# Patient Record
Sex: Female | Born: 1965 | Race: White | Hispanic: No | Smoking: Current every day smoker
Health system: Southern US, Community
[De-identification: ages and names within clinical notes are randomized; demographics above are authoritative.]

## PROBLEM LIST (undated history)

## (undated) DIAGNOSIS — Z9889 Other specified postprocedural states: Secondary | ICD-10-CM

## (undated) DIAGNOSIS — F32A Depression, unspecified: Secondary | ICD-10-CM

## (undated) DIAGNOSIS — R112 Nausea with vomiting, unspecified: Secondary | ICD-10-CM

## (undated) DIAGNOSIS — D649 Anemia, unspecified: Secondary | ICD-10-CM

## (undated) DIAGNOSIS — I10 Essential (primary) hypertension: Secondary | ICD-10-CM

## (undated) DIAGNOSIS — F329 Major depressive disorder, single episode, unspecified: Secondary | ICD-10-CM

## (undated) DIAGNOSIS — D332 Benign neoplasm of brain, unspecified: Secondary | ICD-10-CM

## (undated) DIAGNOSIS — Z9221 Personal history of antineoplastic chemotherapy: Secondary | ICD-10-CM

## (undated) DIAGNOSIS — J189 Pneumonia, unspecified organism: Secondary | ICD-10-CM

## (undated) DIAGNOSIS — C50919 Malignant neoplasm of unspecified site of unspecified female breast: Secondary | ICD-10-CM

## (undated) DIAGNOSIS — Z8669 Personal history of other diseases of the nervous system and sense organs: Secondary | ICD-10-CM

## (undated) DIAGNOSIS — J449 Chronic obstructive pulmonary disease, unspecified: Secondary | ICD-10-CM

## (undated) HISTORY — PX: TONSILLECTOMY: SUR1361

## (undated) HISTORY — PX: TUBAL LIGATION: SHX77

## (undated) HISTORY — PX: OTHER SURGICAL HISTORY: SHX169

## (undated) HISTORY — PX: BRAIN SURGERY: SHX531

---

## 1989-09-03 HISTORY — PX: OTHER SURGICAL HISTORY: SHX169

## 1997-09-03 DIAGNOSIS — C50919 Malignant neoplasm of unspecified site of unspecified female breast: Secondary | ICD-10-CM

## 1997-09-03 DIAGNOSIS — Z923 Personal history of irradiation: Secondary | ICD-10-CM

## 1997-09-03 DIAGNOSIS — Z9221 Personal history of antineoplastic chemotherapy: Secondary | ICD-10-CM

## 1997-09-03 HISTORY — DX: Personal history of irradiation: Z92.3

## 1997-09-03 HISTORY — DX: Personal history of antineoplastic chemotherapy: Z92.21

## 1997-09-03 HISTORY — PX: BREAST BIOPSY: SHX20

## 1997-09-03 HISTORY — DX: Malignant neoplasm of unspecified site of unspecified female breast: C50.919

## 2004-06-03 ENCOUNTER — Encounter: Payer: Self-pay | Admitting: Psychiatry

## 2004-06-03 ENCOUNTER — Ambulatory Visit: Payer: Self-pay | Admitting: Unknown Physician Specialty

## 2004-07-04 ENCOUNTER — Ambulatory Visit: Payer: Self-pay | Admitting: Unknown Physician Specialty

## 2004-08-03 ENCOUNTER — Ambulatory Visit: Payer: Self-pay | Admitting: Unknown Physician Specialty

## 2004-08-30 ENCOUNTER — Emergency Department: Payer: Self-pay | Admitting: Emergency Medicine

## 2004-09-03 ENCOUNTER — Ambulatory Visit: Payer: Self-pay | Admitting: Unknown Physician Specialty

## 2007-09-26 ENCOUNTER — Emergency Department: Payer: Self-pay | Admitting: Emergency Medicine

## 2009-05-10 ENCOUNTER — Emergency Department: Payer: Self-pay

## 2010-05-11 ENCOUNTER — Emergency Department: Payer: Self-pay | Admitting: Emergency Medicine

## 2010-08-21 ENCOUNTER — Ambulatory Visit: Payer: Self-pay

## 2011-02-19 ENCOUNTER — Emergency Department: Payer: Self-pay | Admitting: Emergency Medicine

## 2011-02-22 ENCOUNTER — Emergency Department: Payer: Self-pay | Admitting: Emergency Medicine

## 2012-01-04 ENCOUNTER — Emergency Department: Payer: Self-pay | Admitting: Emergency Medicine

## 2012-09-03 ENCOUNTER — Emergency Department: Payer: Self-pay | Admitting: Emergency Medicine

## 2014-05-29 ENCOUNTER — Emergency Department: Payer: Self-pay | Admitting: Emergency Medicine

## 2015-07-04 ENCOUNTER — Encounter: Payer: Self-pay | Admitting: Emergency Medicine

## 2015-07-04 ENCOUNTER — Emergency Department: Payer: Self-pay

## 2015-07-04 ENCOUNTER — Emergency Department
Admission: EM | Admit: 2015-07-04 | Discharge: 2015-07-04 | Disposition: A | Discharge status: Home / Self Care | Payer: Self-pay | Attending: Student | Admitting: Student

## 2015-07-04 DIAGNOSIS — R51 Headache: Secondary | ICD-10-CM | POA: Insufficient documentation

## 2015-07-04 DIAGNOSIS — Z720 Tobacco use: Secondary | ICD-10-CM | POA: Insufficient documentation

## 2015-07-04 DIAGNOSIS — Z880 Allergy status to penicillin: Secondary | ICD-10-CM | POA: Insufficient documentation

## 2015-07-04 DIAGNOSIS — R55 Syncope and collapse: Secondary | ICD-10-CM | POA: Insufficient documentation

## 2015-07-04 DIAGNOSIS — Z79899 Other long term (current) drug therapy: Secondary | ICD-10-CM | POA: Insufficient documentation

## 2015-07-04 DIAGNOSIS — I10 Essential (primary) hypertension: Secondary | ICD-10-CM | POA: Insufficient documentation

## 2015-07-04 HISTORY — DX: Essential (primary) hypertension: I10

## 2015-07-04 LAB — URINALYSIS COMPLETE WITH MICROSCOPIC (ARMC ONLY)
Bilirubin Urine: NEGATIVE
Glucose, UA: NEGATIVE mg/dL
Hgb urine dipstick: NEGATIVE
Ketones, ur: NEGATIVE mg/dL
Leukocytes, UA: NEGATIVE
Nitrite: NEGATIVE
Protein, ur: NEGATIVE mg/dL
Specific Gravity, Urine: 1.005 (ref 1.005–1.030)
pH: 6 (ref 5.0–8.0)

## 2015-07-04 LAB — CBC
HCT: 46.3 % (ref 35.0–47.0)
Hemoglobin: 16 g/dL (ref 12.0–16.0)
MCH: 31.8 pg (ref 26.0–34.0)
MCHC: 34.5 g/dL (ref 32.0–36.0)
MCV: 92.4 fL (ref 80.0–100.0)
Platelets: 245 10*3/uL (ref 150–440)
RBC: 5.01 MIL/uL (ref 3.80–5.20)
RDW: 14.2 % (ref 11.5–14.5)
WBC: 11.8 10*3/uL — ABNORMAL HIGH (ref 3.6–11.0)

## 2015-07-04 LAB — BASIC METABOLIC PANEL
Anion gap: 12 (ref 5–15)
BUN: 9 mg/dL (ref 6–20)
CO2: 19 mmol/L — ABNORMAL LOW (ref 22–32)
Calcium: 10 mg/dL (ref 8.9–10.3)
Chloride: 105 mmol/L (ref 101–111)
Creatinine, Ser: 0.95 mg/dL (ref 0.44–1.00)
GFR calc Af Amer: >60 mL/min (ref >60)
GFR calc non Af Amer: >60 mL/min (ref >60)
Glucose, Bld: 81 mg/dL (ref 65–99)
Potassium: 3.7 mmol/L (ref 3.5–5.1)
Sodium: 136 mmol/L (ref 135–145)

## 2015-07-04 LAB — TROPONIN I: Troponin I: <0.03 ng/mL (ref <0.031)

## 2015-07-04 LAB — GLUCOSE, CAPILLARY: Glucose-Capillary: 87 mg/dL (ref 65–99)

## 2015-07-04 MED ORDER — ACETAMINOPHEN 500 MG PO TABS
1000.0000 mg | ORAL_TABLET | Freq: Once | ORAL | Status: AC
  Administered 2015-07-04: 1000 mg via ORAL
  Filled 2015-07-04: qty 2

## 2015-07-04 NOTE — ED Notes (Signed)
Pt to ed with c/o weakness and syncopal episode at work today.  Pt denies chest pain, but reports dizziness and weakness.

## 2015-07-04 NOTE — ED Provider Notes (Signed)
[facility] Emergency Department Provider Note  ____________________________________________  Time seen: Approximately 3:05 PM  I have reviewed the triage vital signs and the nursing notes.   HISTORY  Chief Complaint Weakness    HPI Kelsey Russo is a 49 y.o. female with history of hypertension who presents for evaluation of syncopal episode which occurred suddenly earlier today while she was at work, now resolved. The patient reports that she was ill last week with a diarrheal illness and without of work for the past several days. 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 She believes she crumpled to the ground but did not hit her head. She is not complaining of any pain complaints other than mild headache at this time. She denies any chest pain or difficulty breathing. She reports that her diarrhea has resolved. She has a history of fainting in the past when she was pregnant and she reports that that was due to "dehydration". She is currently postmenopausal and denies any chance of pregnancy. Currently her headache is mild. She reports that that may be because she did not eat today. She is hungry but she has no other complaints. No modifying factors.   Past Medical History  Diagnosis Date  . Hypertension     There are no active problems to display for this patient.   History reviewed. No pertinent past surgical history.  Current Outpatient Rx  Name  Route  Sig  Dispense  Refill  . amLODipine (NORVASC) 5 MG tablet   Oral   Take 5 mg by mouth daily.         Marland Kitchen ibuprofen (ADVIL,MOTRIN) 600 MG tablet   Oral   Take 600 mg by mouth every 6 (six) hours as needed.         . Melatonin 5 MG TABS   Oral   Take 1 tablet by mouth at bedtime and may repeat dose one time if needed.           Allergies Penicillins  History reviewed. No pertinent family history.  Social  History Social History  Substance Use Topics  . Smoking status: Current Every Day Smoker  . Smokeless tobacco: None  . Alcohol Use: No    Review of Systems Constitutional: No fever/chills Eyes: No visual changes. ENT: No sore throat. Cardiovascular: Denies chest pain. Respiratory: Denies shortness of breath. Gastrointestinal: No abdominal pain.  No nausea, no vomiting.  No diarrhea.  No constipation. Genitourinary: Negative for dysuria. Musculoskeletal: Negative for back pain. Skin: Negative for rash. Neurological: Positive for headache, no focal weakness or numbness.  10-point ROS otherwise negative.  ____________________________________________   PHYSICAL EXAM:  VITAL SIGNS: ED Triage Vitals  Enc Vitals Group     BP 07/04/15 1400 156/101 mmHg     Pulse Rate 07/04/15 1158 103     Resp 07/04/15 1400 21     Temp 07/04/15 1158 97.9 F (36.6 C)     Temp Source 07/04/15 1158 Oral     SpO2 07/04/15 1158 98 %     Weight 07/04/15 1158 150 lb (68.04 kg)     Height 07/04/15 1158 5\' 7"  (1.702 m)     Head Cir --      Peak Flow --      Pain Score 07/04/15 1150 5     Pain Loc --      Pain Edu? --      Excl. in  GC? --     Constitutional: Alert and oriented. Well appearing and in no acute distress. Eyes: Conjunctivae are normal. PERRL. EOMI. Head: Atraumatic. Nose: No congestion/rhinnorhea. Mouth/Throat: Mucous membranes are moist.  Oropharynx non-erythematous. Neck: No stridor.  No cervical spine tenderness to palpation. Cardiovascular: Normal rate, regular rhythm. Grossly normal heart sounds.  Good peripheral circulation. Respiratory: Normal respiratory effort.  No retractions. Lungs CTAB. Gastrointestinal: Soft and nontender. No distention. No CVA tenderness. Genitourinary: deferred Musculoskeletal: No lower extremity tenderness nor edema.  No joint effusions. Neurologic:  Normal speech and language. No gross focal neurologic deficits are appreciated. 5 out of 5 strength  in bilateral upper and lower extremities, sensation intact to light touch throughout. Cranial nerves II through XII intact. Skin:  Skin is warm, dry and intact. No rash noted. Psychiatric: Mood and affect are normal. Speech and behavior are normal.  ____________________________________________   LABS (all labs ordered are listed, but only abnormal results are displayed)  Labs Reviewed  BASIC METABOLIC PANEL - Abnormal; Notable for the following:    CO2 19 (*)    All other components within normal limits  CBC - Abnormal; Notable for the following:    WBC 11.8 (*)    All other components within normal limits  URINALYSIS COMPLETEWITH MICROSCOPIC ([facility] ONLY) - Abnormal; Notable for the following:    Color, Urine STRAW (*)    APPearance CLEAR (*)    Bacteria, UA RARE (*)    Squamous Epithelial / LPF 0-5 (*)    All other components within normal limits  GLUCOSE, CAPILLARY  TROPONIN I  CBG MONITORING, ED   ____________________________________________  EKG  ED ECG REPORT I, Joanne Gavel, the attending physician, personally viewed and interpreted this ECG.   Date: 07/04/2015  EKG Time: 11:56  Rate: 100  Rhythm: normal sinus rhythm  Axis: normal  Intervals:none  ST&T Change: No acute ST elevation. Q-wave in V2. EKG is unchanged from 05/11/2010  ____________________________________________  RADIOLOGY  CXR IMPRESSION: No acute abnormality. Aortic atherosclerosis. ____________________________________________   PROCEDURES  Procedure(s) performed: None  Critical Care performed: No  ____________________________________________   INITIAL IMPRESSION / ASSESSMENT AND PLAN / ED COURSE  Pertinent labs & imaging results that were available during my care of the patient were reviewed by me and considered in my medical decision making (see chart for details).  Kelsey Russo is a 49 y.o. female with history of hypertension who presents for evaluation of syncopal episode  which occurred suddenly earlier today while she was at work. On exam, she is very well-appearing and in no acute distress. She was tachycardic on arrival however at the time of my assessment, tachycardia has resolved without any intervention. Vital signs otherwise stable, she is afebrile. She has a benign and atraumatic examination including an intact neurological exam. Suspect likely vasovagal syncopal episode in the setting of dehydration given recent diarrheal illness as well as significant prodrome today. No chest pain, no difficulty breathing, troponin negative, EKG unchanged from prior, not consistent with cardiogenic syncope. Intact neurological exam, not consistent with neurogenic syncope. Labs reviewed. CBC notable for very mild leukocytosis which may be catecholamine induced. BMP unremarkable. Urinalysis not consistent with infection. Chest x-ray shows no acute cardiopulmonary abnormality. She has declined any IV fluids stating she would just like to go home instead. We discussed oral hydration, return precautions and need for close PCP follow-up and she is comfortable with the discharge plan. ____________________________________________   FINAL CLINICAL IMPRESSION(S) / ED DIAGNOSES  Final diagnoses:  Vasovagal syncope      Joanne Gavel, MD 07/04/15 1558

## 2015-07-04 NOTE — ED Notes (Signed)
PT CBG 87 

## 2015-07-13 ENCOUNTER — Observation Stay: Payer: Self-pay

## 2015-07-13 ENCOUNTER — Observation Stay
Admission: EM | Admit: 2015-07-13 | Discharge: 2015-07-13 | Discharge status: Against Medical Advice | Payer: Self-pay | Attending: Internal Medicine | Admitting: Internal Medicine

## 2015-07-13 ENCOUNTER — Encounter: Payer: Self-pay | Admitting: Emergency Medicine

## 2015-07-13 ENCOUNTER — Emergency Department: Payer: Self-pay

## 2015-07-13 ENCOUNTER — Other Ambulatory Visit: Payer: Self-pay

## 2015-07-13 DIAGNOSIS — R4182 Altered mental status, unspecified: Principal | ICD-10-CM | POA: Diagnosis present

## 2015-07-13 DIAGNOSIS — R410 Disorientation, unspecified: Secondary | ICD-10-CM

## 2015-07-13 DIAGNOSIS — F329 Major depressive disorder, single episode, unspecified: Secondary | ICD-10-CM | POA: Insufficient documentation

## 2015-07-13 DIAGNOSIS — Z79899 Other long term (current) drug therapy: Secondary | ICD-10-CM | POA: Insufficient documentation

## 2015-07-13 DIAGNOSIS — Z853 Personal history of malignant neoplasm of breast: Secondary | ICD-10-CM | POA: Insufficient documentation

## 2015-07-13 DIAGNOSIS — G40909 Epilepsy, unspecified, not intractable, without status epilepticus: Secondary | ICD-10-CM | POA: Insufficient documentation

## 2015-07-13 DIAGNOSIS — R918 Other nonspecific abnormal finding of lung field: Secondary | ICD-10-CM | POA: Insufficient documentation

## 2015-07-13 DIAGNOSIS — R200 Anesthesia of skin: Secondary | ICD-10-CM | POA: Insufficient documentation

## 2015-07-13 DIAGNOSIS — Z9889 Other specified postprocedural states: Secondary | ICD-10-CM | POA: Insufficient documentation

## 2015-07-13 DIAGNOSIS — Z86011 Personal history of benign neoplasm of the brain: Secondary | ICD-10-CM | POA: Insufficient documentation

## 2015-07-13 DIAGNOSIS — Z880 Allergy status to penicillin: Secondary | ICD-10-CM | POA: Insufficient documentation

## 2015-07-13 DIAGNOSIS — F17200 Nicotine dependence, unspecified, uncomplicated: Secondary | ICD-10-CM | POA: Insufficient documentation

## 2015-07-13 DIAGNOSIS — I10 Essential (primary) hypertension: Secondary | ICD-10-CM | POA: Insufficient documentation

## 2015-07-13 DIAGNOSIS — R079 Chest pain, unspecified: Secondary | ICD-10-CM | POA: Insufficient documentation

## 2015-07-13 DIAGNOSIS — Z7901 Long term (current) use of anticoagulants: Secondary | ICD-10-CM | POA: Insufficient documentation

## 2015-07-13 HISTORY — DX: Malignant neoplasm of unspecified site of unspecified female breast: C50.919

## 2015-07-13 HISTORY — DX: Depression, unspecified: F32.A

## 2015-07-13 HISTORY — DX: Major depressive disorder, single episode, unspecified: F32.9

## 2015-07-13 HISTORY — DX: Personal history of other diseases of the nervous system and sense organs: Z86.69

## 2015-07-13 LAB — URINE DRUG SCREEN, QUALITATIVE (ARMC ONLY)
Amphetamines, Ur Screen: NOT DETECTED
Barbiturates, Ur Screen: NOT DETECTED
Benzodiazepine, Ur Scrn: NOT DETECTED
Cannabinoid 50 Ng, Ur ~~LOC~~: POSITIVE — AB
Cocaine Metabolite,Ur ~~LOC~~: NOT DETECTED
MDMA (Ecstasy)Ur Screen: NOT DETECTED
Methadone Scn, Ur: NOT DETECTED
Opiate, Ur Screen: NOT DETECTED
Phencyclidine (PCP) Ur S: NOT DETECTED
Tricyclic, Ur Screen: NOT DETECTED

## 2015-07-13 LAB — BASIC METABOLIC PANEL
Anion gap: 9 (ref 5–15)
BUN: 23 mg/dL — ABNORMAL HIGH (ref 6–20)
CO2: 27 mmol/L (ref 22–32)
Calcium: 9.4 mg/dL (ref 8.9–10.3)
Chloride: 102 mmol/L (ref 101–111)
Creatinine, Ser: 0.94 mg/dL (ref 0.44–1.00)
GFR calc Af Amer: >60 mL/min (ref >60)
GFR calc non Af Amer: >60 mL/min (ref >60)
Glucose, Bld: 113 mg/dL — ABNORMAL HIGH (ref 65–99)
Potassium: 4.6 mmol/L (ref 3.5–5.1)
Sodium: 138 mmol/L (ref 135–145)

## 2015-07-13 LAB — URINALYSIS COMPLETE WITH MICROSCOPIC (ARMC ONLY)
Bacteria, UA: NONE SEEN
Bilirubin Urine: NEGATIVE
Glucose, UA: NEGATIVE mg/dL
Hgb urine dipstick: NEGATIVE
Ketones, ur: NEGATIVE mg/dL
Leukocytes, UA: NEGATIVE
Nitrite: NEGATIVE
Protein, ur: NEGATIVE mg/dL
Specific Gravity, Urine: 1.008 (ref 1.005–1.030)
Squamous Epithelial / LPF: NONE SEEN
pH: 6 (ref 5.0–8.0)

## 2015-07-13 LAB — BLOOD GAS, ARTERIAL
Acid-Base Excess: 0.3 mmol/L (ref 0.0–3.0)
Bicarbonate: 24.6 mEq/L (ref 21.0–28.0)
FIO2: 0.21
O2 Saturation: 96.8 %
Patient temperature: 37
pCO2 arterial: 38 mmHg (ref 32.0–48.0)
pH, Arterial: 7.42 (ref 7.350–7.450)
pO2, Arterial: 87 mmHg (ref 83.0–108.0)

## 2015-07-13 LAB — HEPATIC FUNCTION PANEL
ALT: 17 U/L (ref 14–54)
AST: 19 U/L (ref 15–41)
Albumin: 4.2 g/dL (ref 3.5–5.0)
Alkaline Phosphatase: 101 U/L (ref 38–126)
Bilirubin, Direct: <0.1 mg/dL — ABNORMAL LOW (ref 0.1–0.5)
Total Bilirubin: 0.2 mg/dL — ABNORMAL LOW (ref 0.3–1.2)
Total Protein: 7.4 g/dL (ref 6.5–8.1)

## 2015-07-13 LAB — TSH: TSH: 1.706 u[IU]/mL (ref 0.350–4.500)

## 2015-07-13 LAB — SALICYLATE LEVEL: Salicylate Lvl: <4 mg/dL (ref 2.8–30.0)

## 2015-07-13 LAB — CBC
HCT: 40.7 % (ref 35.0–47.0)
Hemoglobin: 13.8 g/dL (ref 12.0–16.0)
MCH: 31.1 pg (ref 26.0–34.0)
MCHC: 33.8 g/dL (ref 32.0–36.0)
MCV: 92.1 fL (ref 80.0–100.0)
Platelets: 187 10*3/uL (ref 150–440)
RBC: 4.42 MIL/uL (ref 3.80–5.20)
RDW: 13.8 % (ref 11.5–14.5)
WBC: 11.4 10*3/uL — ABNORMAL HIGH (ref 3.6–11.0)

## 2015-07-13 LAB — AMMONIA: Ammonia: 24 umol/L (ref 9–35)

## 2015-07-13 LAB — ACETAMINOPHEN LEVEL: Acetaminophen (Tylenol), Serum: <10 ug/mL — ABNORMAL LOW (ref 10–30)

## 2015-07-13 LAB — ETHANOL: Alcohol, Ethyl (B): <5 mg/dL (ref <5)

## 2015-07-13 LAB — FIBRIN DERIVATIVES D-DIMER (ARMC ONLY): Fibrin derivatives D-dimer (ARMC): 416 (ref 0–499)

## 2015-07-13 LAB — TROPONIN I: Troponin I: <0.03 ng/mL (ref <0.031)

## 2015-07-13 MED ORDER — ONDANSETRON HCL 4 MG/2ML IJ SOLN
4.0000 mg | Freq: Once | INTRAMUSCULAR | Status: AC
  Administered 2015-07-13: 4 mg via INTRAVENOUS
  Filled 2015-07-13: qty 2

## 2015-07-13 MED ORDER — SODIUM CHLORIDE 0.9 % IV BOLUS (SEPSIS)
1000.0000 mL | Freq: Once | INTRAVENOUS | Status: AC
  Administered 2015-07-13: 1000 mL via INTRAVENOUS

## 2015-07-13 MED ORDER — AMLODIPINE BESYLATE 5 MG PO TABS: 5.0000 mg | ORAL_TABLET | Freq: Every day | ORAL | Status: DC

## 2015-07-13 MED ORDER — HYDRALAZINE HCL 20 MG/ML IJ SOLN: 10.0000 mg | Freq: Four times a day (QID) | INTRAMUSCULAR | Status: DC | PRN

## 2015-07-13 MED ORDER — ACETAMINOPHEN 650 MG RE SUPP
650.0000 mg | Freq: Four times a day (QID) | RECTAL | Status: DC | PRN
Start: 2015-07-13 — End: 2015-07-13

## 2015-07-13 MED ORDER — NITROGLYCERIN 0.4 MG SL SUBL: 0.4000 mg | SUBLINGUAL_TABLET | SUBLINGUAL | Status: DC | PRN

## 2015-07-13 MED ORDER — ACETAMINOPHEN 325 MG PO TABS: 650.0000 mg | ORAL_TABLET | Freq: Four times a day (QID) | ORAL | Status: DC | PRN

## 2015-07-13 MED ORDER — SODIUM CHLORIDE 0.9 % IV SOLN
INTRAVENOUS | Status: DC
  Administered 2015-07-13: 16:00:00 via INTRAVENOUS

## 2015-07-13 MED ORDER — ONDANSETRON HCL 4 MG/2ML IJ SOLN: 4.0000 mg | Freq: Four times a day (QID) | INTRAMUSCULAR | Status: DC | PRN

## 2015-07-13 MED ORDER — LORAZEPAM 2 MG/ML IJ SOLN
1.0000 mg | Freq: Once | INTRAMUSCULAR | Status: AC
  Administered 2015-07-13: 1 mg via INTRAVENOUS
  Filled 2015-07-13: qty 1

## 2015-07-13 MED ORDER — ENOXAPARIN SODIUM 40 MG/0.4ML ~~LOC~~ SOLN: 40.0000 mg | SUBCUTANEOUS | Status: DC

## 2015-07-13 MED ORDER — ONDANSETRON HCL 4 MG PO TABS: 4.0000 mg | ORAL_TABLET | Freq: Four times a day (QID) | ORAL | Status: DC | PRN

## 2015-07-13 MED ORDER — MECLIZINE HCL 12.5 MG PO TABS: 25.0000 mg | ORAL_TABLET | Freq: Once | ORAL | Status: DC

## 2015-07-13 MED ORDER — SODIUM CHLORIDE 0.9 % IJ SOLN: 3.0000 mL | Freq: Two times a day (BID) | INTRAMUSCULAR | Status: DC

## 2015-07-13 MED ORDER — ASPIRIN 81 MG PO CHEW
324.0000 mg | CHEWABLE_TABLET | Freq: Once | ORAL | Status: DC
  Filled 2015-07-13: qty 4

## 2015-07-13 MED ORDER — GADOBENATE DIMEGLUMINE 529 MG/ML IV SOLN
15.0000 mL | Freq: Once | INTRAVENOUS | Status: AC | PRN
  Administered 2015-07-13: 14 mL via INTRAVENOUS

## 2015-07-13 NOTE — Progress Notes (Signed)
Pt stating she is going home since we are not giving her a sleeping pill and letting her leave the floor to smoke.  I tried to convince the patient and educate her and her family on why she should stay.  Pt insisted on leaving even after offering to page doctor for a smoking alternative.

## 2015-07-13 NOTE — ED Notes (Signed)
Patient transported to X-ray 

## 2015-07-13 NOTE — ED Notes (Signed)
Patient unresponsive to this RN. Has gag reflex when suctioned. Does not open eyes or respond to voice commands. Daughter states patient is hard of hearing. Family denies any drug use.

## 2015-07-13 NOTE — ED Notes (Signed)
Pt presents to ED with c/o left sided chest pain that radiates to right shoulder, onset yesterday. Pt also reports SOB, nausea and vomiting. Pt reports hypertension 201/168 prior at arrival.

## 2015-07-13 NOTE — ED Notes (Signed)
Pt very restless, states she has been confused, has left lower chest pain,,

## 2015-07-13 NOTE — H&P (Addendum)
[facility] [place] NAME: Kelsey Russo    MR#:  [id number]  DATE OF BIRTH:  Aug 28, 1966  DATE OF ADMISSION:  07/13/2015  PRIMARY CARE PHYSICIAN: Lorelee Market, MD   REQUESTING/REFERRING PHYSICIAN: Dr. Shirlyn Goltz  CHIEF COMPLAINT:   Chief Complaint  Patient presents with  . Chest Pain    HISTORY OF PRESENT ILLNESS:  Kelsey Russo  is a 49 y.o. female with a known history of hypertension, history of breast cancer in remission, remote history of seizures, depression not in any medications presents to the hospital secondary to altered mental status. Patient is very lethargic and most of the history is obtained from her daughter at bedside. Daughter states the patient at baseline is alert and oriented. She felt that friend and went to [facility] to check her blood pressure yesterday and her systolic pressure was greater than 200. Her mother came home did not take any medication and complained of some numbness in her arm and leg and went to bed. Patient's husband described that the patient got more delirious last night, very disoriented was stumbling having jerky moments complaining of chest pain and neck pain and very vague complaints. Patient did not take any medications, did not overdose according to the family. No alcohol abuse. Her urine tox screen is negative here. But patient is very lethargic and just arousable at this time. His more restless and had greater than thousand mL of urine retained, and still Foley catheter was placed, patient became more calm and resting.  PAST MEDICAL HISTORY:   Past Medical History  Diagnosis Date  . Hypertension   . Depression   . Breast cancer [facility])     Status post right breast lumpectomy and radiation  . History of seizure disorder     PAST SURGICAL HISTORY:   Past Surgical History  Procedure Laterality Date  . Brain tumor resection    . Right breast lumpectomy      SOCIAL  HISTORY:   Social History  Substance Use Topics  . Smoking status: Current Every Day Smoker  . Smokeless tobacco: Not on file  . Alcohol Use: No    FAMILY HISTORY:   Family History  Problem Relation Age of Onset  . Depression Mother   . CAD Father   . Hypertension Father   . Lung cancer Father   . Breast cancer Maternal Grandmother     DRUG ALLERGIES:   Allergies  Allergen Reactions  . Penicillins Rash    REVIEW OF SYSTEMS:   Review of Systems  Unable to perform ROS: mental acuity    MEDICATIONS AT HOME:   Prior to Admission medications   Medication Sig Start Date End Date Taking? Authorizing Provider  amLODipine (NORVASC) 5 MG tablet Take 5 mg by mouth daily.   Yes Historical Provider, MD  ibuprofen (ADVIL,MOTRIN) 600 MG tablet Take 600 mg by mouth every 6 (six) hours as needed.   Yes Historical Provider, MD  Melatonin 5 MG TABS Take 1 tablet by mouth at bedtime and may repeat dose one time if needed.   Yes Historical Provider, MD      VITAL SIGNS:  Blood pressure 103/74, pulse 80, temperature 98.1 F (36.7 C), temperature source Oral, resp. rate 17, SpO2 100 %.  PHYSICAL EXAMINATION:   Physical Exam  GENERAL:  49 y.o.-year-old patient lying in the bed, very lethargic. Agitated and moving restless in bed EYES: Pupils equal, round, reactive to light and accommodation.  No scleral icterus. Extraocular muscles intact.  HEENT: Head atraumatic, normocephalic. Oropharynx and nasopharynx clear.  NECK:  Supple, no jugular venous distention. No thyroid enlargement, no tenderness.  LUNGS: Snoring at times when sleeping deeply, Normal breath sounds bilaterally, no wheezing, rales,rhonchi or crepitation. No use of accessory muscles of respiration. Decreased bibasilar breath sounds CARDIOVASCULAR: S1, S2 normal. No murmurs, rubs, or gallops.  ABDOMEN: Soft, nontender, nondistended. Bowel sounds present. No organomegaly or mass.  EXTREMITIES: No pedal edema, cyanosis, or  clubbing.  NEUROLOGIC: Patient is very lethargic, unable to follow any commands. Arouses to deep sternal rub and has some myoclonic jerks. Withdrawing all extremities to pain  PSYCHIATRIC: Patient is lethargic and not following commands  SKIN: No obvious rash, lesion, or ulcer.   LABORATORY PANEL:   CBC  Recent Labs Lab 07/13/15 0648  WBC 11.4*  HGB 13.8  HCT 40.7  PLT 187   ------------------------------------------------------------------------------------------------------------------  Chemistries   Recent Labs Lab 07/13/15 0648  NA 138  K 4.6  CL 102  CO2 27  GLUCOSE 113*  BUN 23*  CREATININE 0.94  CALCIUM 9.4  AST 19  ALT 17  ALKPHOS 101  BILITOT 0.2*   ------------------------------------------------------------------------------------------------------------------  Cardiac Enzymes  Recent Labs Lab 07/13/15 0648  TROPONINI <0.03   ------------------------------------------------------------------------------------------------------------------  RADIOLOGY:  Dg Chest 2 View  07/13/2015  CLINICAL DATA:  49 year old female with left-sided chest pain radiating into the right shoulder EXAM: CHEST  2 VIEW COMPARISON:  Prior chest x-ray 07/04/2015 FINDINGS: Cardiac and mediastinal contours are within normal limits. Atherosclerotic calcification present in the transverse aorta. Diffuse mild interstitial prominence not significantly changed compared to prior. No pneumothorax, pleural effusion or focal airspace consolidation. The lungs remain hyperinflated. No acute osseous abnormality. IMPRESSION: 1. No acute cardiopulmonary process. 2. Pulmonary hyperinflation with diffuse mild interstitial prominence appear similar and suggests underlying COPD. Electronically Signed   By: Jacqulynn Cadet M.D.   On: 07/13/2015 07:46   Ct Head Wo Contrast  07/13/2015  CLINICAL DATA:  Left-sided chest pain radiating to the right shoulder. EXAM: CT HEAD WITHOUT CONTRAST TECHNIQUE:  Contiguous axial images were obtained from the base of the skull through the vertex without intravenous contrast. COMPARISON:  None. FINDINGS: There is no evidence of mass effect, midline shift or extra-axial fluid collections. There is no evidence of a space-occupying lesion or intracranial hemorrhage. There is no evidence of a cortical-based area of acute infarction. There is a small calcification in the inferior left cerebellum adjacent to the fourth ventricle which is likely dystrophic. The ventricles and sulci are appropriate for the patient's age. The basal cisterns are patent. Visualized portions of the orbits are unremarkable. The visualized portions of the paranasal sinuses and mastoid air cells are unremarkable. There is evidence of prior suboccipital craniotomy. No other focal calvarial abnormality. IMPRESSION: No acute intracranial pathology. Electronically Signed   By: Kathreen Devoid   On: 07/13/2015 08:52    EKG:   Orders placed or performed during the hospital encounter of 07/13/15  . EKG 12-Lead  . EKG 12-Lead  . EKG 12-Lead  . EKG 12-Lead  . EKG 12-Lead  . EKG 12-Lead    IMPRESSION AND PLAN:   Kelsey Russo  is a 49 y.o. female with a known history of hypertension, history of breast cancer in remission, remote history of seizures, depression not in any medications presents to the hospital secondary to altered mental status.  #1 altered mental status-unknown cause. Could be toxic/embolic encephalopathy. -Not sure if patient took any  medication-overdosed -No prescription medication seen other than Norvasc and melatonin. -Urine tox screen is completely negative other than for marijuana -CT of the head is negative for any acute findings. Urine analysis chest x-ray did not show any infection. -Remote history of seizures and also surgery done for brain tumor several several years ago. EEG ordered. -Ammonia level ordered. After neurology consulted. -Neuro checks frequently. For now  patient is protecting her airway and she has a cough and a gag reflex  #2 hypertension-hold her low-dose Norvasc at this time  #3 history of breast cancer-that is post right breast lumpectomy and radiation, currently in remission.  #4 DVT prophylaxis-on Lovenox    All the records are reviewed and case discussed with ED provider. Management plans discussed with the patient, family and they are in agreement.  CODE STATUS: Full Code  TOTAL TIME TAKING CARE OF THIS PATIENT: 50 minutes.    Gladstone Lighter M.D on 07/13/2015 at 12:58 PM  Between 7am to 6pm - Pager - [phone]  After 6pm go to [email or web address] - password [email or web address]  Office  [phone]  CC: Primary care physician; Lorelee Market, MD

## 2015-07-13 NOTE — ED Notes (Signed)
Patient transported to CT 

## 2015-07-13 NOTE — ED Provider Notes (Signed)
CSN: [id number]     Arrival date & time 07/13/15  0737 History   First MD Initiated Contact with Patient 07/13/15 [phone]     Chief Complaint  Patient presents with  . Chest Pain     (Consider location/radiation/quality/duration/timing/severity/associated sxs/prior Treatment) The history is provided by the patient.  Kelsey Russo is a 49 y.o. female hx of HTN, here presenting with chest pain, dizziness, shortness of breath. Patient states that she started having left-sided chest pain that radiated to the shoulder. Started this morning associated with some nausea as well as 3 episodes of vomiting. She did check her blood pressure and was elevated 201/168. Patient states that she has some headaches as well has been going on first several days as well as some neck pain. Patient is very good historian and appears intoxicated but denies any alcohol yesterday. States that she feels confused but couldn't give me much details. Seen in the ER about a week ago for syncope at work and had normal labs as well as UA and chest x-ray.   Level V caveat- AMS     Past Medical History  Diagnosis Date  . Hypertension    History reviewed. No pertinent past surgical history. History reviewed. No pertinent family history. Social History  Substance Use Topics  . Smoking status: Current Every Day Smoker  . Smokeless tobacco: None  . Alcohol Use: No   OB History    No data available     Review of Systems  Unable to perform ROS: Mental status change  Cardiovascular: Positive for chest pain.  All other systems reviewed and are negative.     Allergies  Penicillins  Home Medications   Prior to Admission medications   Medication Sig Start Date End Date Taking? Authorizing Provider  amLODipine (NORVASC) 5 MG tablet Take 5 mg by mouth daily.   Yes Historical Provider, MD  ibuprofen (ADVIL,MOTRIN) 600 MG tablet Take 600 mg by mouth every 6 (six) hours as needed.   Yes Historical Provider, MD   Melatonin 5 MG TABS Take 1 tablet by mouth at bedtime and may repeat dose one time if needed.   Yes Historical Provider, MD   BP 102/67 mmHg  Pulse 85  Temp(Src) 98.1 F (36.7 C) (Oral)  Resp 19  SpO2 100% Physical Exam  Constitutional:  Confused   HENT:  Head: Normocephalic.  Mouth/Throat: Oropharynx is clear and moist.  Eyes: Conjunctivae are normal. Pupils are equal, round, and reactive to light.  No obvious nystagmus   Neck: Normal range of motion. Neck supple.  Cardiovascular: Normal rate, regular rhythm and normal heart sounds.   Pulmonary/Chest: Breath sounds normal. No respiratory distress. She has no wheezes. She has no rales.  Abdominal: Soft. Bowel sounds are normal. She exhibits no distension. There is no tenderness. There is no rebound.  Musculoskeletal: Normal range of motion.  Neurological:  Alert, moving all extremities. Doesn't follow commands well. Appears intoxicated   Skin: Skin is warm.  Psychiatric:  Unable   Nursing note and vitals reviewed.   ED Course  Procedures (including critical care time)  CRITICAL CARE Performed by: Darl Householder, Marciel Offenberger   Total critical care time: 30 minutes  Critical care time was exclusive of separately billable procedures and treating other patients.  Critical care was necessary to treat or prevent imminent or life-threatening deterioration.  Critical care was time spent personally by me on the following activities: development of treatment plan with patient and/or surrogate as well as nursing, discussions  with consultants, evaluation of patient's response to treatment, examination of patient, obtaining history from patient or surrogate, ordering and performing treatments and interventions, ordering and review of laboratory studies, ordering and review of radiographic studies, pulse oximetry and re-evaluation of patient's condition.   Labs Review Labs Reviewed  CBC - Abnormal; Notable for the following:    WBC 11.4 (*)    All  other components within normal limits  BASIC METABOLIC PANEL - Abnormal; Notable for the following:    Glucose, Bld 113 (*)    BUN 23 (*)    All other components within normal limits  URINALYSIS COMPLETEWITH MICROSCOPIC ([facility] ONLY) - Abnormal; Notable for the following:    Color, Urine STRAW (*)    APPearance CLEAR (*)    All other components within normal limits  URINE DRUG SCREEN, QUALITATIVE ([facility] ONLY) - Abnormal; Notable for the following:    Cannabinoid 50 Ng, Ur [place] POSITIVE (*)    All other components within normal limits  HEPATIC FUNCTION PANEL - Abnormal; Notable for the following:    Total Bilirubin 0.2 (*)    Bilirubin, Direct <0.1 (*)    All other components within normal limits  ACETAMINOPHEN LEVEL - Abnormal; Notable for the following:    Acetaminophen (Tylenol), Serum <10 (*)    All other components within normal limits  TROPONIN I  ETHANOL  FIBRIN DERIVATIVES D-DIMER ([facility] ONLY)  SALICYLATE LEVEL  BLOOD GAS, ARTERIAL    Imaging Review Dg Chest 2 View  07/13/2015  CLINICAL DATA:  49 year old female with left-sided chest pain radiating into the right shoulder EXAM: CHEST  2 VIEW COMPARISON:  Prior chest x-ray 07/04/2015 FINDINGS: Cardiac and mediastinal contours are within normal limits. Atherosclerotic calcification present in the transverse aorta. Diffuse mild interstitial prominence not significantly changed compared to prior. No pneumothorax, pleural effusion or focal airspace consolidation. The lungs remain hyperinflated. No acute osseous abnormality. IMPRESSION: 1. No acute cardiopulmonary process. 2. Pulmonary hyperinflation with diffuse mild interstitial prominence appear similar and suggests underlying COPD. Electronically Signed   By: Jacqulynn Cadet M.D.   On: 07/13/2015 07:46   Ct Head Wo Contrast  07/13/2015  CLINICAL DATA:  Left-sided chest pain radiating to the right shoulder. EXAM: CT HEAD WITHOUT CONTRAST TECHNIQUE: Contiguous axial images were  obtained from the base of the skull through the vertex without intravenous contrast. COMPARISON:  None. FINDINGS: There is no evidence of mass effect, midline shift or extra-axial fluid collections. There is no evidence of a space-occupying lesion or intracranial hemorrhage. There is no evidence of a cortical-based area of acute infarction. There is a small calcification in the inferior left cerebellum adjacent to the fourth ventricle which is likely dystrophic. The ventricles and sulci are appropriate for the patient's age. The basal cisterns are patent. Visualized portions of the orbits are unremarkable. The visualized portions of the paranasal sinuses and mastoid air cells are unremarkable. There is evidence of prior suboccipital craniotomy. No other focal calvarial abnormality. IMPRESSION: No acute intracranial pathology. Electronically Signed   By: Kathreen Devoid   On: 07/13/2015 08:52   I have personally reviewed and evaluated these images and lab results as part of my medical decision-making.   EKG Interpretation None       ED ECG REPORT I, Amarionna Arca, the attending physician, personally viewed and interpreted this ECG.   Date: 07/13/2015  EKG Time: 6:29 am  Rate: 99  Rhythm: normal EKG, normal sinus rhythm, unchanged from previous tracings, prolonged QT interval  Axis:  normal  Intervals:none  ST&T Change: none   MDM   Final diagnoses:  None    Kelsey Russo is a 49 y.o. female here with headaches, chest pain, shortness of breath, possible alcohol intoxication. Difficult to exam due to confusion. Differential is broad. Will get CT head to r/o bleed, labs, trop, CXR, tox, d-dimer (to r/o dissection and or PE). Will reassess.   9:30 AM D-dimer neg. Tox showed marijuana. CT head nl. Still altered. Talked to daughter. She has been confused for several days. Has hx of depression not on meds. Had tried committing suicide with ingestion previously. Denies any drug use but patient very  altered. Urinated on herself in the room.   11:29 AM Still altered. UDS neg. Tox neg. Still has prolonged QTc 480s. Likely taken some drugs. Will admit.     Wandra Arthurs, MD 07/13/15 [id number]

## 2015-07-13 NOTE — ED Notes (Signed)
1200 cc urine returned when foley placed

## 2015-07-14 NOTE — Discharge Summary (Signed)
[facility] NAME: Kelsey Russo Russo    MR#:  [id number]  DATE OF BIRTH:  [birth year]  DATE OF ADMISSION:  07/13/2015 ADMITTING PHYSICIAN: Gladstone Lighter, MD  DATE OF DISCHARGE: 07/13/2015  6:31 PM  Patient left AGAINST MEDICAL ADVICE  PRIMARY CARE PHYSICIAN: Lorelee Market, MD    ADMISSION DIAGNOSIS:  Confusion [R41.0] Altered mental status [R41.82]  DISCHARGE DIAGNOSIS:  Active Problems:   Altered mental status   SECONDARY DIAGNOSIS:   Past Medical History  Diagnosis Date  . Hypertension   . Depression   . Breast cancer [facility])     Status post right breast lumpectomy and radiation  . History of seizure disorder     HOSPITAL COURSE:   Kelsey Russo Russo is a 49 y.o. female with a known history of hypertension, history of breast cancer in remission, remote history of seizures, depression not in any medications presents to the hospital secondary to altered mental status.  As [name] was admitted for altered mental status. Thought to be toxic encephalopathy.  She did not reveal if she took any medications. Tox screen was negative. All her labs came back normal. CT of the head and MRI of the head were normal without any acute changes. She was admitted for observation. Couple of hours into admission, patient woke up and was completely alert oriented. Blood pressure started to go up, she hasn't been taking her blood pressure medications as outpatient. She was explained why she was admitted and was advised to stay overnight for management of her blood pressure. Patient left AGAINST MEDICAL ADVICE on 07/13/2015   DISCHARGE CONDITIONS:   Stable  CONSULTS OBTAINED:  Treatment Team:  Leotis Pain, MD  DRUG ALLERGIES:   Allergies  Allergen Reactions  . Penicillins Rash    DISCHARGE MEDICATIONS:   Discharge Medication List as of 07/13/2015  6:33 PM    CONTINUE these medications which have NOT CHANGED    Details  amLODipine (NORVASC) 5 MG tablet Take 5 mg by mouth daily., Until Discontinued, Historical Med    ibuprofen (ADVIL,MOTRIN) 600 MG tablet Take 600 mg by mouth every 6 (six) hours as needed., Until Discontinued, Historical Med    Melatonin 5 MG TABS Take 1 tablet by mouth at bedtime and may repeat dose one time if needed., Until Discontinued, Historical Med         DISCHARGE INSTRUCTIONS:   1. F/u with PCP in 1 week  If you experience worsening of your admission symptoms, develop shortness of breath, life threatening emergency, suicidal or homicidal thoughts you must seek medical attention immediately by calling 911 or calling your MD immediately  if symptoms less severe.  You Must read complete instructions/literature along with all the possible adverse reactions/side effects for all the Medicines you take and that have been prescribed to you. Take any new Medicines after you have completely understood and accept all the possible adverse reactions/side effects.   Please note  You were cared for by a hospitalist during your hospital stay. If you have any questions about your discharge medications or the care you received while you were in the hospital after you are discharged, you can call the unit and asked to speak with the hospitalist on call if the hospitalist that took care of you is not available. Once you are discharged, your primary care physician will handle any further medical issues. Please note that NO REFILLS for any discharge medications will be authorized once you are discharged,  as it is imperative that you return to your primary care physician (or establish a relationship with a primary care physician if you do not have one) for your aftercare needs so that they can reassess your need for medications and monitor your lab values.    Today   CHIEF COMPLAINT:   Chief Complaint  Patient presents with  . Chest Pain    VITAL SIGNS:  Blood pressure 165/78, pulse  88, temperature 97.5 F (36.4 C), temperature source Oral, resp. rate 20, height 5\' 7"  (1.702 m), weight 69.4 kg (153 lb), SpO2 100 %.  I/O:   Intake/Output Summary (Last 24 hours) at 07/14/15 1524 Last data filed at 07/13/15 1800  Gross per 24 hour  Intake    120 ml  Output   1000 ml  Net   -880 ml    PHYSICAL EXAMINATION:   Physical Exam  GENERAL:  49 y.o.-year-old patient lying in the bed with no acute distress. Sitting and talking to family. EYES: Pupils equal, round, reactive to light and accommodation. No scleral icterus. Extraocular muscles intact.  HEENT: Head atraumatic, normocephalic. Oropharynx and nasopharynx clear.  NECK:  Supple, no jugular venous distention. No thyroid enlargement, no tenderness.  LUNGS: Normal breath sounds bilaterally, no wheezing, rales,rhonchi or crepitation. No use of accessory muscles of respiration.  CARDIOVASCULAR: S1, S2 normal. No murmurs, rubs, or gallops.  ABDOMEN: Soft, non-tender, non-distended. Bowel sounds present. No organomegaly or mass.  EXTREMITIES: No pedal edema, cyanosis, or clubbing.  NEUROLOGIC: Cranial nerves II through XII are intact. Muscle strength 5/5 in all extremities. Sensation intact. Gait not checked.   PSYCHIATRIC: The patient is alert and oriented x 3.  SKIN: No obvious rash, lesion, or ulcer.   DATA REVIEW:   CBC  Recent Labs Lab 07/13/15 0648  WBC 11.4*  HGB 13.8  HCT 40.7  PLT 187    Chemistries   Recent Labs Lab 07/13/15 0648  NA 138  K 4.6  CL 102  CO2 27  GLUCOSE 113*  BUN 23*  CREATININE 0.94  CALCIUM 9.4  AST 19  ALT 17  ALKPHOS 101  BILITOT 0.2*    Cardiac Enzymes  Recent Labs Lab 07/13/15 0648  TROPONINI <0.03    Microbiology Results  No results found for this or any previous visit.  RADIOLOGY:  Dg Chest 2 View  07/13/2015  CLINICAL DATA:  49 year old female with left-sided chest pain radiating into the right shoulder EXAM: CHEST  2 VIEW COMPARISON:  Prior chest  x-ray 07/04/2015 FINDINGS: Cardiac and mediastinal contours are within normal limits. Atherosclerotic calcification present in the transverse aorta. Diffuse mild interstitial prominence not significantly changed compared to prior. No pneumothorax, pleural effusion or focal airspace consolidation. The lungs remain hyperinflated. No acute osseous abnormality. IMPRESSION: 1. No acute cardiopulmonary process. 2. Pulmonary hyperinflation with diffuse mild interstitial prominence appear similar and suggests underlying COPD. Electronically Signed   By: Jacqulynn Cadet M.D.   On: 07/13/2015 07:46   Ct Head Wo Contrast  07/13/2015  CLINICAL DATA:  Left-sided chest pain radiating to the right shoulder. EXAM: CT HEAD WITHOUT CONTRAST TECHNIQUE: Contiguous axial images were obtained from the base of the skull through the vertex without intravenous contrast. COMPARISON:  None. FINDINGS: There is no evidence of mass effect, midline shift or extra-axial fluid collections. There is no evidence of a space-occupying lesion or intracranial hemorrhage. There is no evidence of a cortical-based area of acute infarction. There is a small calcification in the inferior left cerebellum  adjacent to the fourth ventricle which is likely dystrophic. The ventricles and sulci are appropriate for the patient's age. The basal cisterns are patent. Visualized portions of the orbits are unremarkable. The visualized portions of the paranasal sinuses and mastoid air cells are unremarkable. There is evidence of prior suboccipital craniotomy. No other focal calvarial abnormality. IMPRESSION: No acute intracranial pathology. Electronically Signed   By: Kathreen Devoid   On: 07/13/2015 08:52   Mr Brain W Wo Contrast  07/13/2015  CLINICAL DATA:  Altered mental status. Hypertension. Arm and leg numbness. History of breast cancer in remission. EXAM: MRI HEAD WITHOUT AND WITH CONTRAST TECHNIQUE: Multiplanar, multiecho pulse sequences of the brain and  surrounding structures were obtained without and with intravenous contrast. CONTRAST:  76mL MULTIHANCE GADOBENATE DIMEGLUMINE 529 MG/ML IV SOLN COMPARISON:  Head CT 07/13/2015 FINDINGS: Images are mildly to moderately motion degraded despite using faster, more motion resistant protocols. There is no evidence of acute infarct, intracranial hemorrhage, mass, midline shift, or extra-axial fluid collection. Ventricles are normal. Sequelae of prior suboccipital craniotomy are again identified. There is mild prominence of the cisterna magna which may be related to prior surgery or be developmental, likely with very mild underlying vermian encephalomalacia. Brain is otherwise normal in signal within the limitations imposed by motion artifact. No abnormal enhancement is identified. Orbits are unremarkable. There is a trace right mastoid effusion. Paranasal sinuses are clear. Major intracranial vascular flow voids are preserved. IMPRESSION: 1. Motion degraded examination without evidence of acute intracranial abnormality or mass. 2. Prior suboccipital craniotomy. Electronically Signed   By: Logan Bores M.D.   On: 07/13/2015 14:52    EKG:   Orders placed or performed during the hospital encounter of 07/13/15  . EKG 12-Lead  . EKG 12-Lead  . EKG 12-Lead  . EKG 12-Lead  . EKG 12-Lead  . EKG 12-Lead  . EKG      Management plans discussed with the patient, family and they are in agreement.  CODE STATUS:   TOTAL TIME TAKING CARE OF THIS PATIENT: 35 minutes.    Gladstone Lighter M.D on 07/14/2015 at 3:24 PM  Between 7am to 6pm - Pager - [phone]  After 6pm go to [email or web address] - password [email or web address] [facility]  Office  [phone]  CC: Primary care physician; Lorelee Market, MD

## 2016-10-04 ENCOUNTER — Encounter: Payer: Self-pay | Admitting: Emergency Medicine

## 2016-10-04 ENCOUNTER — Emergency Department: Payer: Self-pay

## 2016-10-04 ENCOUNTER — Emergency Department
Admission: EM | Admit: 2016-10-04 | Discharge: 2016-10-04 | Disposition: A | Discharge status: Home / Self Care | Payer: Self-pay | Attending: Emergency Medicine | Admitting: Emergency Medicine

## 2016-10-04 DIAGNOSIS — Z720 Tobacco use: Secondary | ICD-10-CM

## 2016-10-04 DIAGNOSIS — J40 Bronchitis, not specified as acute or chronic: Secondary | ICD-10-CM | POA: Insufficient documentation

## 2016-10-04 DIAGNOSIS — I10 Essential (primary) hypertension: Secondary | ICD-10-CM | POA: Insufficient documentation

## 2016-10-04 DIAGNOSIS — F17210 Nicotine dependence, cigarettes, uncomplicated: Secondary | ICD-10-CM | POA: Insufficient documentation

## 2016-10-04 DIAGNOSIS — J449 Chronic obstructive pulmonary disease, unspecified: Secondary | ICD-10-CM | POA: Insufficient documentation

## 2016-10-04 DIAGNOSIS — Z853 Personal history of malignant neoplasm of breast: Secondary | ICD-10-CM | POA: Insufficient documentation

## 2016-10-04 MED ORDER — AZITHROMYCIN 250 MG PO TABS: ORAL_TABLET | ORAL | 0 refills | Status: DC

## 2016-10-04 MED ORDER — PREDNISONE 5 MG PO TABS: 30.0000 mg | ORAL_TABLET | Freq: Every day | ORAL | 0 refills | Status: DC

## 2016-10-04 MED ORDER — PROMETHAZINE-DM 6.25-15 MG/5ML PO SYRP: 5.0000 mL | ORAL_SOLUTION | Freq: Four times a day (QID) | ORAL | 0 refills | Status: DC | PRN

## 2016-10-04 MED ORDER — ALBUTEROL SULFATE HFA 108 (90 BASE) MCG/ACT IN AERS: 1.0000 | INHALATION_SPRAY | Freq: Four times a day (QID) | RESPIRATORY_TRACT | 0 refills | Status: DC | PRN

## 2016-10-04 NOTE — ED Provider Notes (Signed)
[facility] Emergency Department Provider Note  ____________________________________________  Time seen: Approximately 12:12 PM  I have reviewed the triage vital signs and the nursing notes.   HISTORY  Chief Complaint Cough and Shortness of Breath    HPI Kelsey Russo is a 51 y.o. female , NAD, presents to the emergency department for evaluation of 10 day history of cough and chest congestion. Has had chills off and on. No fevers, sweats, night sweats, chest pain, wheezing, abdominal pain, nausea or vomiting. Believes she may have had the flu at the beginning of the illness. Endorses a 37 pack year smoking history and has continued to smoke tobacco cigarettes while ill. Denies any history of asthma or COPD. Has been taking over-the-counter Mucinex and NyQuil without significant relief of symptoms. Has a history of hypertension but has not taken her medications today.   Past Medical History:  Diagnosis Date  . Breast cancer [facility])    Status post right breast lumpectomy and radiation  . Depression   . History of seizure disorder   . Hypertension     Patient Active Problem List   Diagnosis Date Noted  . Altered mental status 07/13/2015    Past Surgical History:  Procedure Laterality Date  . Brain tumor resection    . Right breast lumpectomy      Prior to Admission medications   Medication Sig Start Date End Date Taking? Authorizing Provider  albuterol (PROVENTIL HFA;VENTOLIN HFA) 108 (90 Base) MCG/ACT inhaler Inhale 1-2 puffs into the lungs every 6 (six) hours as needed for wheezing or shortness of breath. 10/04/16   Toshia Larkin L Kateline Kinkade, PA-C  amLODipine (NORVASC) 5 MG tablet Take 5 mg by mouth daily.    Historical Provider, MD  azithromycin (ZITHROMAX Z-PAK) 250 MG tablet Take 2 tablets (500 mg) on  Day 1,  followed by 1 tablet (250 mg) once daily on Days 2 through 5. 10/04/16   Dalary Hollar L March Steyer, PA-C  ibuprofen (ADVIL,MOTRIN) 600 MG tablet Take 600 mg by mouth  every 6 (six) hours as needed.    Historical Provider, MD  Melatonin 5 MG TABS Take 1 tablet by mouth at bedtime and may repeat dose one time if needed.    Historical Provider, MD  predniSONE (DELTASONE) 5 MG tablet Take 6 tablets (30 mg total) by mouth daily with breakfast. May take for up to 5 days.  Do not take any NSAIDs with this medication. Take with food. 10/04/16   Aaidyn San L Mac Dowdell, PA-C  promethazine-dextromethorphan (PROMETHAZINE-DM) 6.25-15 MG/5ML syrup Take 5 mLs by mouth 4 (four) times daily as needed for cough. 10/04/16   Shania Bjelland L Jullisa Grigoryan, PA-C    Allergies Penicillins  Family History  Problem Relation Age of Onset  . Depression Mother   . CAD Father   . Hypertension Father   . Lung cancer Father   . Breast cancer Maternal Grandmother     Social History Social History  Substance Use Topics  . Smoking status: Current Every Day Smoker    Packs/day: 0.50    Types: Cigarettes  . Smokeless tobacco: Never Used  . Alcohol use No     Review of Systems  Constitutional: Positive chills. No fever, generalized sweats, night sweats, fatigue Eyes: No visual changes.  ENT: Positive nasal congestion, runny nose and sinus pressure. No sore throat, ear pain, ear drainage. Cardiovascular: No chest pain, palpitations. Respiratory: Positive productive cough with chest congestion and shortness of breath.  No wheezing.  Gastrointestinal: No abdominal pain.  No nausea, vomiting.  No diarrhea.  Musculoskeletal: Negative for general myalgias.  Skin: Negative for rash. Neurological: Negative for headaches, focal weakness or numbness. No tingling. No lightheadedness, dizziness. 10-point ROS otherwise negative.  ____________________________________________   PHYSICAL EXAM:  VITAL SIGNS: ED Triage Vitals  Enc Vitals Group     BP 10/04/16 1158 (!) 182/123     Pulse Rate 10/04/16 1158 94     Resp 10/04/16 1158 18     Temp 10/04/16 1158 98.1 F (36.7 C)     Temp Source 10/04/16 1158 Oral      SpO2 10/04/16 1158 99 %     Weight 10/04/16 1156 150 lb (68 kg)     Height 10/04/16 1156 5\' 7"  (1.702 m)     Head Circumference --      Peak Flow --      Pain Score 10/04/16 1157 6     Pain Loc --      Pain Edu? --      Excl. in [place]? --      Constitutional: Alert and oriented. Well appearing and in no acute distress. Eyes: Conjunctivae are normal.  Head: Atraumatic. ENT:      Ears: TMs visualized bilaterally with serous effusion but no bulging, erythema or perforation.      Nose: Mild congestion with clear rhinorrhea.      Mouth/Throat: Mucous membranes are moist. Poor dentition throughout. Pharynx without erythema, swelling, exudate. Uvula is midline. Airway is patent. Neck: Supple with full range of motion Hematological/Lymphatic/Immunilogical: No cervical lymphadenopathy. Cardiovascular: Normal rate, regular rhythm. Normal S1 and S2.  Good peripheral circulation. Respiratory: Normal respiratory effort without tachypnea or retractions. Lungs CTAB with breath sounds noted in all lung fields. No wheeze, rhonchi, rales Neurologic:  Normal speech and language. No gross focal neurologic deficits are appreciated.  Skin:  Skin is warm, dry and intact. No rash noted. Psychiatric: Mood and affect are normal. Speech and behavior are normal. Patient exhibits appropriate insight and judgement.   ____________________________________________   LABS  None ____________________________________________  EKG  None ____________________________________________  RADIOLOGY I, Braxton Feathers, personally viewed and evaluated these images (plain radiographs) as part of my medical decision making, as well as reviewing the written report by the radiologist.  Dg Chest 2 View  Result Date: 10/04/2016 CLINICAL DATA:  Cough and shortness of breath for the past 10 days. Current smoker. History of breast malignancy EXAM: CHEST  2 VIEW COMPARISON:  Chest x-ray of July 13, 2015 FINDINGS: The lungs are  mildly hyperinflated with hemidiaphragm flattening. The interstitial markings are coarse though stable. There is no alveolar infiltrate or pleural effusion. The heart and pulmonary vascularity are normal. There is calcification in the wall of the aortic arch. The trachea is midline. There is multilevel degenerative disc space narrowing of the thoracic spine. IMPRESSION: COPD.  No pneumonia nor CHF.  No evidence of metastatic disease. Thoracic aortic atherosclerosis. Electronically Signed   By: David  Martinique M.D.   On: 10/04/2016 12:43    ____________________________________________    PROCEDURES  Procedure(s) performed: None   Procedures   Medications - No data to display   ____________________________________________   INITIAL IMPRESSION / ASSESSMENT AND PLAN / ED COURSE  Pertinent labs & imaging results that were available during my care of the patient were reviewed by me and considered in my medical decision making (see chart for details).     Patient's diagnosis is consistent with unspecified COPD, bronchitis and tobacco use. Patient will be  discharged home with prescriptions for azithromycin, albuterol inhaler, prednisone and promethazine DM to take as directed. May take over-the-counter Tylenol as needed. Patient is to follow up with her primary care provider if symptoms persist past this treatment course. Patient is given ED precautions to return to the ED for any worsening or new symptoms.    ____________________________________________  FINAL CLINICAL IMPRESSION(S) / ED DIAGNOSES  Final diagnoses:  Chronic obstructive pulmonary disease, unspecified COPD type ([place])  Bronchitis  Tobacco use      NEW MEDICATIONS STARTED DURING THIS VISIT:  Discharge Medication List as of 10/04/2016  1:28 PM    START taking these medications   Details  albuterol (PROVENTIL HFA;VENTOLIN HFA) 108 (90 Base) MCG/ACT inhaler Inhale 1-2 puffs into the lungs every 6 (six) hours as needed  for wheezing or shortness of breath., Starting Thu 10/04/2016, Print    azithromycin (ZITHROMAX Z-PAK) 250 MG tablet Take 2 tablets (500 mg) on  Day 1,  followed by 1 tablet (250 mg) once daily on Days 2 through 5., Print    predniSONE (DELTASONE) 5 MG tablet Take 6 tablets (30 mg total) by mouth daily with breakfast. May take for up to 5 days.  Do not take any NSAIDs with this medication. Take with food., Starting Thu 10/04/2016, Print    promethazine-dextromethorphan (PROMETHAZINE-DM) 6.25-15 MG/5ML syrup Take 5 mLs by mouth 4 (four) times daily as needed for cough., Starting Thu 10/04/2016, [place], PA-C 10/04/16 [place], MD 10/05/16 [phone]

## 2016-10-04 NOTE — ED Triage Notes (Signed)
C/O cough and shortness of breath x 10 days.  Cough is non productive.

## 2016-10-04 NOTE — ED Notes (Signed)
Patient given information on [facility], [facility], and [facility].

## 2016-10-04 NOTE — ED Notes (Signed)
See triage note   Cough,congestion and sore throat for several days afebrile on arrival

## 2017-06-26 ENCOUNTER — Encounter: Payer: Self-pay | Admitting: Emergency Medicine

## 2017-06-26 ENCOUNTER — Emergency Department
Admission: EM | Admit: 2017-06-26 | Discharge: 2017-06-26 | Disposition: A | Discharge status: Home / Self Care | Payer: Self-pay | Attending: Emergency Medicine | Admitting: Emergency Medicine

## 2017-06-26 ENCOUNTER — Emergency Department: Payer: Self-pay

## 2017-06-26 DIAGNOSIS — F17210 Nicotine dependence, cigarettes, uncomplicated: Secondary | ICD-10-CM | POA: Insufficient documentation

## 2017-06-26 DIAGNOSIS — Z79899 Other long term (current) drug therapy: Secondary | ICD-10-CM | POA: Insufficient documentation

## 2017-06-26 DIAGNOSIS — J180 Bronchopneumonia, unspecified organism: Secondary | ICD-10-CM | POA: Insufficient documentation

## 2017-06-26 DIAGNOSIS — I10 Essential (primary) hypertension: Secondary | ICD-10-CM | POA: Insufficient documentation

## 2017-06-26 MED ORDER — FLUTICASONE PROPIONATE 50 MCG/ACT NA SUSP: 2.0000 | Freq: Every day | NASAL | 0 refills | Status: DC

## 2017-06-26 MED ORDER — ALBUTEROL SULFATE HFA 108 (90 BASE) MCG/ACT IN AERS: 2.0000 | INHALATION_SPRAY | Freq: Four times a day (QID) | RESPIRATORY_TRACT | 0 refills | Status: DC | PRN

## 2017-06-26 MED ORDER — BENZONATATE 100 MG PO CAPS: 100.0000 mg | ORAL_CAPSULE | Freq: Three times a day (TID) | ORAL | 0 refills | Status: DC | PRN

## 2017-06-26 MED ORDER — AZITHROMYCIN 250 MG PO TABS: 250.0000 mg | ORAL_TABLET | Freq: Every day | ORAL | 0 refills | Status: AC

## 2017-06-26 MED ORDER — AMLODIPINE BESYLATE 5 MG PO TABS: 5.0000 mg | ORAL_TABLET | Freq: Every day | ORAL | 1 refills | Status: DC

## 2017-06-26 MED ORDER — AZITHROMYCIN 500 MG PO TABS
500.0000 mg | ORAL_TABLET | Freq: Once | ORAL | Status: AC
  Administered 2017-06-26: 500 mg via ORAL
  Filled 2017-06-26: qty 1

## 2017-06-26 NOTE — ED Triage Notes (Signed)
Pt to ED with c/o of chest congestion w/ cough that has been ongoing for a week. Pt states productive cough.

## 2017-06-26 NOTE — ED Notes (Signed)
Pt c/o congestion and generalized body aches that started last Wednesday. Pt states this morning she coughed up yellow tinged sputum.

## 2017-06-26 NOTE — Discharge Instructions (Signed)
Your x-ray reveals bronchopneumonia in the lungs. Take the antibiotic as directed. Drink plenty of water to help promote productive coughing. Take the other prescription meds as directed. Consider using OTC Delsym (dextromethorphan) and Mucinex (guaifenesin) for control and mucous production. Follow-up with [facility] for routine medical care and hypertension management. Return to the ED as needed.

## 2017-06-26 NOTE — ED Provider Notes (Signed)
[facility] Emergency Department Provider Note ____________________________________________  Time seen: 1018  I have reviewed the triage vital signs and the nursing notes.  HISTORY  Chief Complaint  Cough and Nasal Congestion  HPI Kelsey Russo is a 51 y.o. female presents herself to the ED for evaluation of a one-week complaint of chest congestion and productive cough.  Patient denies any frank fevers, but has been taking DayQuil and NyQuil intermittently with limited lasting benefit.  She denies any significant chest pain or shortness of breath.  She does give a history of hypertension and COPD, and continues to be a everyday smoker.  She also reports her blood pressure is uncontrolled as she has not had medication in several months since losing her insurance.  She denies any other complaint at this time.  Past Medical History:  Diagnosis Date  . Breast cancer [facility])    Status post right breast lumpectomy and radiation  . Depression   . History of seizure disorder   . Hypertension     Patient Active Problem List   Diagnosis Date Noted  . Altered mental status 07/13/2015    Past Surgical History:  Procedure Laterality Date  . Brain tumor resection    . Right breast lumpectomy      Prior to Admission medications   Medication Sig Start Date End Date Taking? Authorizing Provider  albuterol (PROVENTIL HFA;VENTOLIN HFA) 108 (90 Base) MCG/ACT inhaler Inhale 2 puffs into the lungs every 6 (six) hours as needed for wheezing or shortness of breath. 06/26/17   Aquilla Shambley, Dannielle Karvonen, PA-C  amLODipine (NORVASC) 5 MG tablet Take 5 mg by mouth daily.    [provider]  amLODipine (NORVASC) 5 MG tablet Take 1 tablet (5 mg total) by mouth daily. 06/26/17 08/25/17  Avram Danielson, Dannielle Karvonen, PA-C  azithromycin (ZITHROMAX Z-PAK) 250 MG tablet Take 1 tablet (250 mg total) by mouth daily. Take 2 tablets (500 mg) on  Day 1,  followed by 1 tablet (250 mg) once  daily on Days 2 through 5. 06/26/17 06/30/17  Mccayla Shimada, Dannielle Karvonen, PA-C  benzonatate (TESSALON PERLES) 100 MG capsule Take 1 capsule (100 mg total) by mouth 3 (three) times daily as needed for cough (Take 1-2 per dose). 06/26/17   Tiombe Tomeo, Dannielle Karvonen, PA-C  fluticasone (FLONASE) 50 MCG/ACT nasal spray Place 2 sprays into both nostrils daily. 06/26/17   Jenesys Casseus, Dannielle Karvonen, PA-C  ibuprofen (ADVIL,MOTRIN) 600 MG tablet Take 600 mg by mouth every 6 (six) hours as needed.    [provider]  Melatonin 5 MG TABS Take 1 tablet by mouth at bedtime and may repeat dose one time if needed.    [provider]    Allergies Penicillins  Family History  Problem Relation Age of Onset  . Depression Mother   . CAD Father   . Hypertension Father   . Lung cancer Father   . Breast cancer Maternal Grandmother     Social History Social History  Substance Use Topics  . Smoking status: Current Every Day Smoker    Packs/day: 0.50    Types: Cigarettes  . Smokeless tobacco: Never Used  . Alcohol use No    Review of Systems  Constitutional: Negative for fever. Eyes: Negative for visual changes. ENT: Negative for sore throat.   Cardiovascular: Negative for chest pain. Respiratory: Negative for shortness of breath. Chest congestion as above. Productive cough as noted . Gastrointestinal: Negative for abdominal pain, vomiting and diarrhea.  Genitourinary: Negative for dysuria. Musculoskeletal: Negative for back pain. Skin: Negative for rash. Neurological: Negative for headaches, focal weakness or numbness. ____________________________________________  PHYSICAL EXAM:  VITAL SIGNS: ED Triage Vitals  Enc Vitals Group     BP 06/26/17 0902 (!) 181/113     Pulse Rate 06/26/17 0902 80     Resp 06/26/17 0902 18     Temp 06/26/17 0902 97.6 F (36.4 C)     Temp Source 06/26/17 0902 Oral     SpO2 06/26/17 0902 100 %     Weight --      Height --      Head Circumference --       Peak Flow --      Pain Score 06/26/17 0908 4     Pain Loc --      Pain Edu? --      Excl. in [place]? --     Constitutional: Alert and oriented. Well appearing and in no distress. Head: Normocephalic and atraumatic. Eyes: Conjunctivae are normal. PERRL. Normal extraocular movements Ears: Canals clear. TMs intact bilaterally. Nose: No congestion/rhinorrhea/epistaxis. Mouth/Throat: Mucous membranes are moist. Neck: Supple. No thyromegaly. Hematological/Lymphatic/Immunological: No cervical lymphadenopathy. Cardiovascular: Normal rate, regular rhythm. Normal distal pulses. Respiratory: Normal respiratory effort. No wheezes/rales/rhonchi. Gastrointestinal: Soft and nontender. No distention. Musculoskeletal: Nontender with normal range of motion in all extremities.  Neurologic:  Normal gait without ataxia. Normal speech and language. No gross focal neurologic deficits are appreciated. Skin:  Skin is warm, dry and intact. No rash noted. ____________________________________________   RADIOLOGY  CXR  IMPRESSION: Chronic lung disease. Suspicion of patchy bronchopneumonia in the right upper lobe and left lower lobe. No dense consolidation. ____________________________________________  PROCEDURES  Azithromycin 500 mg PO ____________________________________________  INITIAL IMPRESSION / ASSESSMENT AND PLAN / ED COURSE  Patient with ED evaluation 1 week complaint of chest congestion and productive cough.  Her chest x-ray confirms a suspicion of a bronchopneumonia in the right upper lobe in the left lower lobe.  Patient is given prescriptions for azithromycin, Tessalon Perles, Flonase, and her Norvasc.  She has uncontrolled hypertension and has been out of her medications for several months citing financial difficulty since losing her insurance.  She will follow-up with 1 of the community clinics for routine and ongoing medical care. ____________________________________________  FINAL  CLINICAL IMPRESSION(S) / ED DIAGNOSES  Final diagnoses:  Bronchopneumonia  Uncontrolled hypertension     Carmie End, Dannielle Karvonen, PA-C 06/26/17 1921    Orbie Pyo, MD 06/27/17 [phone]

## 2017-07-15 ENCOUNTER — Ambulatory Visit: Payer: Self-pay | Admitting: Pharmacy Technician

## 2017-07-15 DIAGNOSIS — Z79899 Other long term (current) drug therapy: Secondary | ICD-10-CM

## 2017-07-15 NOTE — Progress Notes (Signed)
Met with patient completed financial assistance application for [facility] due to recent hospital visit.  Patient agreed to be responsible for gathering financial information and forwarding to appropriate department in [place].    Completed [facility] application and contract.  Patient agreed to all terms of the [facility] contract.   Patient approved to receive medication assistance at [facility] through 2018, as long as eligibility continues to be met.    Provided patient with community resource material based on her particular needs.    Referred patient to [facility].  Betty J. Kluttz Care Manager [facility]  

## 2017-07-18 ENCOUNTER — Telehealth: Payer: Self-pay

## 2017-07-18 NOTE — Telephone Encounter (Signed)
-----   Message from Jacquelynn Cree sent at 07/15/2017  5:39 PM EST ----- Patient is uninsured.  Has hypertension, COPD, Osteoporosis and has recovered from breast cancer.  She would like to be seen at [facility].  Thank you.

## 2017-07-18 NOTE — Telephone Encounter (Signed)
Called pt to go over eligibility. No answer left msg.

## 2017-11-01 ENCOUNTER — Telehealth: Payer: Self-pay | Admitting: Pharmacy Technician

## 2017-11-01 NOTE — Telephone Encounter (Signed)
Received updated proof of income.  Patient eligible to receive medication assistance at [facility] through 2019, as long as eligibility requirements continue to be met.  [facility]

## 2018-04-17 ENCOUNTER — Other Ambulatory Visit: Payer: Self-pay

## 2018-04-17 ENCOUNTER — Emergency Department
Admission: EM | Admit: 2018-04-17 | Discharge: 2018-04-17 | Disposition: A | Discharge status: Home / Self Care | Payer: Self-pay | Attending: Emergency Medicine | Admitting: Emergency Medicine

## 2018-04-17 DIAGNOSIS — I10 Essential (primary) hypertension: Secondary | ICD-10-CM | POA: Insufficient documentation

## 2018-04-17 DIAGNOSIS — L03211 Cellulitis of face: Secondary | ICD-10-CM | POA: Insufficient documentation

## 2018-04-17 DIAGNOSIS — F17210 Nicotine dependence, cigarettes, uncomplicated: Secondary | ICD-10-CM | POA: Insufficient documentation

## 2018-04-17 DIAGNOSIS — Z79899 Other long term (current) drug therapy: Secondary | ICD-10-CM | POA: Insufficient documentation

## 2018-04-17 DIAGNOSIS — Z853 Personal history of malignant neoplasm of breast: Secondary | ICD-10-CM | POA: Insufficient documentation

## 2018-04-17 MED ORDER — AMLODIPINE BESYLATE 5 MG PO TABS: 5.0000 mg | ORAL_TABLET | Freq: Every day | ORAL | 1 refills | Status: DC

## 2018-04-17 MED ORDER — CLINDAMYCIN HCL 300 MG PO CAPS: 300.0000 mg | ORAL_CAPSULE | Freq: Three times a day (TID) | ORAL | 0 refills | Status: DC

## 2018-04-17 MED ORDER — CLINDAMYCIN HCL 300 MG PO CAPS: 300.0000 mg | ORAL_CAPSULE | Freq: Three times a day (TID) | ORAL | 0 refills | Status: AC

## 2018-04-17 NOTE — ED Notes (Signed)
See triage note  Presents with red raised area to right temple  States she ha shad a mole there  But now area is painful and shooting pain into right eye

## 2018-04-17 NOTE — ED Provider Notes (Signed)
[facility] Emergency Department Provider Note  ____________________________________________  Time seen: Approximately 9:29 AM  I have reviewed the triage vital signs and the nursing notes.   HISTORY  Chief Complaint Abscess    HPI Kelsey Russo is a 52 y.o. female that presents to the emergency department for evaluation of redness and swelling to mole lateral to right eye for 4 days.  Area is mildly tender.  Patient states that area feels "hard." She is not having any difficulty moving her eye. No fever, chills.  Past Medical History:  Diagnosis Date  . Breast cancer [facility])    Status post right breast lumpectomy and radiation  . Depression   . History of seizure disorder   . Hypertension     Patient Active Problem List   Diagnosis Date Noted  . Altered mental status 07/13/2015    Past Surgical History:  Procedure Laterality Date  . Brain tumor resection    . Right breast lumpectomy      Prior to Admission medications   Medication Sig Start Date End Date Taking? Authorizing Provider  albuterol (PROVENTIL HFA;VENTOLIN HFA) 108 (90 Base) MCG/ACT inhaler Inhale 2 puffs into the lungs every 6 (six) hours as needed for wheezing or shortness of breath. 06/26/17   Menshew, Dannielle Karvonen, PA-C  amLODipine (NORVASC) 5 MG tablet Take 1 tablet (5 mg total) by mouth daily. 04/17/18 06/16/18  Laban Emperor, PA-C  benzonatate (TESSALON PERLES) 100 MG capsule Take 1 capsule (100 mg total) by mouth 3 (three) times daily as needed for cough (Take 1-2 per dose). 06/26/17   Menshew, Dannielle Karvonen, PA-C  clindamycin (CLEOCIN) 300 MG capsule Take 1 capsule (300 mg total) by mouth 3 (three) times daily for 10 days. 04/17/18 04/27/18  Laban Emperor, PA-C  fluticasone (FLONASE) 50 MCG/ACT nasal spray Place 2 sprays into both nostrils daily. 06/26/17   Menshew, Dannielle Karvonen, PA-C  ibuprofen (ADVIL,MOTRIN) 600 MG tablet Take 600 mg by mouth every 6 (six) hours as  needed.    [provider]  Melatonin 5 MG TABS Take 1 tablet by mouth at bedtime and may repeat dose one time if needed.    [provider]    Allergies Imitrex [sumatriptan] and Penicillins  Family History  Problem Relation Age of Onset  . Depression Mother   . CAD Father   . Hypertension Father   . Lung cancer Father   . Breast cancer Maternal Grandmother     Social History Social History   Tobacco Use  . Smoking status: Current Every Day Smoker    Packs/day: 0.50    Types: Cigarettes  . Smokeless tobacco: Never Used  Substance Use Topics  . Alcohol use: No    Alcohol/week: 0.0 standard drinks  . Drug use: No    Comment: Marijuana     Review of Systems  Constitutional: No fever/chills Cardiovascular: No chest pain. Respiratory: No SOB. Gastrointestinal: No abdominal pain.  No nausea, no vomiting.  Musculoskeletal: Negative for musculoskeletal pain. Skin: Negative for abrasions, lacerations, ecchymosis. Positive for rash. Neurological: Negative for headaches   ____________________________________________   PHYSICAL EXAM:  VITAL SIGNS: ED Triage Vitals  Enc Vitals Group     BP 04/17/18 0850 (S) (!) 187/108     Pulse Rate 04/17/18 0849 80     Resp 04/17/18 0849 17     Temp 04/17/18 0849 97.8 F (36.6 C)     Temp Source 04/17/18 0849 Oral  SpO2 04/17/18 0849 100 %     Weight 04/17/18 0850 137 lb (62.1 kg)     Height 04/17/18 0850 5\' 7"  (1.702 m)     Head Circumference --      Peak Flow --      Pain Score 04/17/18 0850 5     Pain Loc --      Pain Edu? --      Excl. in [place]? --      Constitutional: Alert and oriented. Well appearing and in no acute distress. Eyes: Conjunctivae are normal. PERRL. EOMI. Head: 1/4cm by 1/4cm mole lateral to right eye with overlying and minimal surrounding erythema. Minimally tender to palpation. No erythema or swelling surrounding orbits.  ENT:      Ears:      Nose: No congestion/rhinnorhea.       Mouth/Throat: Mucous membranes are moist.  Neck: No stridor.   Cardiovascular: Normal rate, regular rhythm.  Good peripheral circulation. Respiratory: Normal respiratory effort without tachypnea or retractions. Lungs CTAB. Good air entry to the bases with no decreased or absent breath sounds. Musculoskeletal: Full range of motion to all extremities. No gross deformities appreciated. Neurologic:  Normal speech and language. No gross focal neurologic deficits are appreciated.  Skin:  Skin is warm, dry and intact. Psychiatric: Mood and affect are normal. Speech and behavior are normal. Patient exhibits appropriate insight and judgement.   ____________________________________________   LABS (all labs ordered are listed, but only abnormal results are displayed)  Labs Reviewed - No data to display ____________________________________________  EKG   ____________________________________________  RADIOLOGY   No results found.  ____________________________________________    PROCEDURES  Procedure(s) performed:    Procedures    Medications - No data to display   ____________________________________________   INITIAL IMPRESSION / ASSESSMENT AND PLAN / ED COURSE  Pertinent labs & imaging results that were available during my care of the patient were reviewed by me and considered in my medical decision making (see chart for details).  Review of the [place] CSRS was performed in accordance of the [place] prior to dispensing any controlled drugs.   Patient's diagnosis is consistent with skin infection and uncontrolled hypertension.  Vital signs and exam are reassuring.  Patient has some erythema overlying a mole to lateral right eye.  I do not feel any fluctuance or any drainable abscess.  Patient has hypertension and has been out of her blood pressure medications for several months.  She states that she has not been able to afford to see primary care. Education about risks of high  blood pressure were given.  I encouraged [facility]  Patient will be discharged home with prescriptions for clindamycin and amlodipine. Patient is to follow up with primary care as directed. Patient is given ED precautions to return to the ED for any worsening or new symptoms.     ____________________________________________  FINAL CLINICAL IMPRESSION(S) / ED DIAGNOSES  Final diagnoses:  Cellulitis of face  Uncontrolled hypertension      NEW MEDICATIONS STARTED DURING THIS VISIT:  ED Discharge Orders         Ordered    amLODipine (NORVASC) 5 MG tablet  Daily,   Status:  Discontinued     04/17/18 0933    clindamycin (CLEOCIN) 300 MG capsule  3 times daily,   Status:  Discontinued     04/17/18 0933    amLODipine (NORVASC) 5 MG tablet  Daily     04/17/18 0945  clindamycin (CLEOCIN) 300 MG capsule  3 times daily     04/17/18 0945              This chart was dictated using voice recognition software/Dragon. Despite best efforts to proofread, errors can occur which can change the meaning. Any change was purely unintentional.    Laban Emperor, PA-C 04/17/18 1509    Earleen Newport, MD 04/17/18 [phone]

## 2018-04-17 NOTE — ED Triage Notes (Signed)
Pt c/o redness and swelling around a mole on the right side of face since monday

## 2018-04-17 NOTE — Discharge Instructions (Addendum)
Please return to the emergency department tomorrow for fever, increased pain, swelling, or redness to area, or any other concerning symptoms.   [facility] for [facility] medical examinations and management of blood pressure medication.

## 2018-05-12 ENCOUNTER — Emergency Department: Payer: Self-pay

## 2018-05-12 ENCOUNTER — Emergency Department
Admission: EM | Admit: 2018-05-12 | Discharge: 2018-05-12 | Disposition: A | Discharge status: Home / Self Care | Payer: Self-pay | Attending: Emergency Medicine | Admitting: Emergency Medicine

## 2018-05-12 ENCOUNTER — Other Ambulatory Visit: Payer: Self-pay

## 2018-05-12 DIAGNOSIS — I10 Essential (primary) hypertension: Secondary | ICD-10-CM

## 2018-05-12 DIAGNOSIS — M62838 Other muscle spasm: Secondary | ICD-10-CM

## 2018-05-12 DIAGNOSIS — Z79899 Other long term (current) drug therapy: Secondary | ICD-10-CM | POA: Insufficient documentation

## 2018-05-12 DIAGNOSIS — Z853 Personal history of malignant neoplasm of breast: Secondary | ICD-10-CM | POA: Insufficient documentation

## 2018-05-12 DIAGNOSIS — F17210 Nicotine dependence, cigarettes, uncomplicated: Secondary | ICD-10-CM | POA: Insufficient documentation

## 2018-05-12 DIAGNOSIS — R202 Paresthesia of skin: Secondary | ICD-10-CM | POA: Insufficient documentation

## 2018-05-12 LAB — BASIC METABOLIC PANEL
Anion gap: 8 (ref 5–15)
BUN: 11 mg/dL (ref 6–20)
CO2: 25 mmol/L (ref 22–32)
Calcium: 9.4 mg/dL (ref 8.9–10.3)
Chloride: 106 mmol/L (ref 98–111)
Creatinine, Ser: 0.62 mg/dL (ref 0.44–1.00)
GFR calc Af Amer: >60 mL/min (ref >60)
GFR calc non Af Amer: >60 mL/min (ref >60)
Glucose, Bld: 93 mg/dL (ref 70–99)
Potassium: 3.8 mmol/L (ref 3.5–5.1)
Sodium: 139 mmol/L (ref 135–145)

## 2018-05-12 LAB — CBC
HCT: 46.1 % (ref 35.0–47.0)
Hemoglobin: 15.8 g/dL (ref 12.0–16.0)
MCH: 32.3 pg (ref 26.0–34.0)
MCHC: 34.4 g/dL (ref 32.0–36.0)
MCV: 93.9 fL (ref 80.0–100.0)
Platelets: 204 10*3/uL (ref 150–440)
RBC: 4.91 MIL/uL (ref 3.80–5.20)
RDW: 13.6 % (ref 11.5–14.5)
WBC: 7.7 10*3/uL (ref 3.6–11.0)

## 2018-05-12 LAB — TROPONIN I: Troponin I: <0.03 ng/mL (ref <0.03)

## 2018-05-12 MED ORDER — CYCLOBENZAPRINE HCL 10 MG PO TABS: 10.0000 mg | ORAL_TABLET | Freq: Three times a day (TID) | ORAL | 0 refills | Status: DC | PRN

## 2018-05-12 MED ORDER — IBUPROFEN 800 MG PO TABS: 800.0000 mg | ORAL_TABLET | Freq: Three times a day (TID) | ORAL | 0 refills | Status: DC | PRN

## 2018-05-12 MED ORDER — ONDANSETRON 4 MG PO TBDP
4.0000 mg | ORAL_TABLET | Freq: Once | ORAL | Status: AC
  Administered 2018-05-12: 4 mg via ORAL
  Filled 2018-05-12: qty 1

## 2018-05-12 NOTE — ED Notes (Signed)
Called for patient to a room.  No response.

## 2018-05-12 NOTE — Discharge Instructions (Signed)
You are evaluated for right shoulder pain and as we discussed although no certain cause was found, I am most suspicious that the muscle spasm of the trapezius muscles is in the symptoms.  We discussed heat, massage, relaxation and reduction of use of that shoulder may help.  You are also being prescribed anti-inflammatory ibuprofen as well as muscle relaxer Flexeril.  Return to the emergency room immediately for any worsening condition including any true weakness or numbness, or any new or worsening or uncontrolled pain, any chest pain, trouble breathing, fever, skin rash, or any other symptoms concerning to you.

## 2018-05-12 NOTE — ED Triage Notes (Signed)
Pt reports right arm pain that woke her from her sleep. She began having chest pain and SOB. She took an Excedrin at [employer]. Pain is a 6/10.

## 2018-05-12 NOTE — ED Provider Notes (Signed)
[facility] Emergency Department Provider Note ____________________________________________   I have reviewed the triage vital signs and the triage nursing note.  HISTORY  Chief Complaint Chest Pain   Historian Patient  HPI Kelsey Russo is a 52 y.o. female with a history of hypertension, takes Norvasc 5 mg, presents today with right top of the shoulder pain, that was worse overnight but has been there it sounds like for a week or so.  This morning she thought that she may have slept on it wrong but because the pain seemed to be going down the right arm and be associated with some tingling, she wanted to be evaluated.  No PCP.  No true weakness or true numbness.  States that she has a weird sensation of tingling somewhere around the elbow.  No coughing or trouble breathing.  No pleuritic chest pain.  No chest pain.  She is right-handed.  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 Symptoms are moderate.     Past Medical History:  Diagnosis Date  . Breast cancer [facility])    Status post right breast lumpectomy and radiation  . Depression   . History of seizure disorder   . Hypertension     Patient Active Problem List   Diagnosis Date Noted  . Altered mental status 07/13/2015    Past Surgical History:  Procedure Laterality Date  . Brain tumor resection    . Right breast lumpectomy      Prior to Admission medications   Medication Sig Start Date End Date Taking? Authorizing Provider  albuterol (PROVENTIL HFA;VENTOLIN HFA) 108 (90 Base) MCG/ACT inhaler Inhale 2 puffs into the lungs every 6 (six) hours as needed for wheezing or shortness of breath. 06/26/17   Menshew, Dannielle Karvonen, PA-C  amLODipine (NORVASC) 5 MG tablet Take 1 tablet (5 mg total) by mouth daily. 04/17/18 06/16/18  Laban Emperor, PA-C  benzonatate (TESSALON PERLES) 100 MG capsule Take 1 capsule (100 mg total) by mouth 3 (three) times daily as needed for  cough (Take 1-2 per dose). 06/26/17   Menshew, Dannielle Karvonen, PA-C  cyclobenzaprine (FLEXERIL) 10 MG tablet Take 1 tablet (10 mg total) by mouth 3 (three) times daily as needed for muscle spasms. 05/12/18   Lisa Roca, MD  fluticasone (FLONASE) 50 MCG/ACT nasal spray Place 2 sprays into both nostrils daily. 06/26/17   Menshew, Dannielle Karvonen, PA-C  ibuprofen (ADVIL,MOTRIN) 800 MG tablet Take 1 tablet (800 mg total) by mouth every 8 (eight) hours as needed. 05/12/18   Lisa Roca, MD  Melatonin 5 MG TABS Take 1 tablet by mouth at bedtime and may repeat dose one time if needed.    [provider]    Allergies  Allergen Reactions  . Imitrex [Sumatriptan] Anaphylaxis  . Penicillins Rash    Family History  Problem Relation Age of Onset  . Depression Mother   . CAD Father   . Hypertension Father   . Lung cancer Father   . Breast cancer Maternal Grandmother     Social History Social History   Tobacco Use  . Smoking status: Current Every Day Smoker    Packs/day: 0.50    Types: Cigarettes  . Smokeless tobacco: Never Used  Substance Use Topics  . Alcohol use: No    Alcohol/week: 0.0 standard drinks  . Drug use: No    Comment: Marijuana    Review of Systems  Constitutional: Negative for fever. Eyes: Negative  for visual changes. ENT: Negative for sore throat. Cardiovascular: Negative for chest pain. Respiratory: Negative for shortness of breath. Gastrointestinal: Negative for abdominal pain, vomiting and diarrhea. Genitourinary: Negative for dysuria. Musculoskeletal: Negative for back pain.  Today for right top of the shoulder pain along the trapezius. Skin: Negative for rash. Neurological: Negative for headache.  ____________________________________________   PHYSICAL EXAM:  VITAL SIGNS: ED Triage Vitals  Enc Vitals Group     BP 05/12/18 0920 (!) 175/102     Pulse Rate 05/12/18 0920 84     Resp 05/12/18 0920 20     Temp 05/12/18 0920 97.8 F (36.6 C)      Temp src --      SpO2 05/12/18 0920 99 %     Weight 05/12/18 0914 138 lb (62.6 kg)     Height 05/12/18 0914 5\' 7"  (1.702 m)     Head Circumference --      Peak Flow --      Pain Score 05/12/18 0913 6     Pain Loc --      Pain Edu? --      Excl. in [place]? --      Constitutional: Alert and oriented.  HEENT      Head: Normocephalic and atraumatic.      Eyes: Conjunctivae are normal. Pupils equal and round.       Ears:         Nose: No congestion/rhinnorhea.      Mouth/Throat: Mucous membranes are moist.      Neck: No stridor.  Very tight/spasmodic right trapezius at the top of the shoulder into the right shoulder and medially towards the spine. Cardiovascular/Chest: Normal rate, regular rhythm.  No murmurs, rubs, or gallops. Respiratory: Normal respiratory effort without tachypnea nor retractions. Breath sounds are clear and equal bilaterally. No wheezes/rales/rhonchi. Gastrointestinal: Soft. No distention, no guarding, no rebound. Nontender.    Genitourinary/rectal:Deferred Musculoskeletal: Nontender with normal range of motion in all extremities. No joint effusions.  No lower extremity tenderness.  No edema. Neurologic:  Normal speech and language. No gross or focal neurologic deficits are appreciated. Skin:  Skin is warm, dry and intact. No rash noted. Psychiatric: Mood and affect are normal. Speech and behavior are normal. Patient exhibits appropriate insight and judgment.   ____________________________________________  LABS (pertinent positives/negatives) I, Lisa Roca, MD the attending physician have reviewed the labs noted below.  Labs Reviewed  BASIC METABOLIC PANEL  CBC  TROPONIN I    ____________________________________________    EKG I, Lisa Roca, MD, the attending physician have personally viewed and interpreted all ECGs.  73 bpm.  Normal sinus rhythm.  Narrow QS.  No axis.  Normal ST and T  wave ____________________________________________  RADIOLOGY   Chest x-ray two-view, radiologist interpretation is reviewed, shows no acute findings. __________________________________________  PROCEDURES  Procedure(s) performed: None  Procedures  Critical Care performed: None   ____________________________________________  ED COURSE / ASSESSMENT AND PLAN  Pertinent labs & imaging results that were available during my care of the patient were reviewed by me and considered in my medical decision making (see chart for details).    Chief complaint by triage was noted as chest pain, but is not really chest pain it is more so top of the right shoulder pain where she has a very tight and tender muscle spasm of the trapezius.  Low suspicious for cardiac etiology.  EKG is reassuring, symptoms greater than 4 hours, with negative troponin.  I do not think repeat  troponin is necessary.  No respiratory symptoms.  On neurologic testing, she has no true weakness or true numbness.  She states that it feels sensation in the same on both sides when I tested it, but then states that she feels like there was tingling there around the elbow.  I am not suspicious for stroke, with neurologic exam intact, we discussed risk versus benefit of doing additional investigation of that, versus the most likely cause here which is trapezius muscle spasm.  She would like to proceed with treatment conservatively for muscle spasm.  We discussed return precautions.  Her blood pressure is elevated here, however given that she is having pain and known high blood pressure, we discussed treating pain and having her follow-up within the next week at a primary care doctor's office to recheck blood pressure.  She was given 3 clinic numbers to make a primary care appointment.   CONSULTATIONS: None   Patient / Family / Caregiver informed of clinical course, medical decision-making process, and agree with plan.   I  discussed return precautions, follow-up instructions, and discharge instructions with patient and/or family.  Discharge Instructions : You are evaluated for right shoulder pain and as we discussed although no certain cause was found, I am most suspicious that the muscle spasm of the trapezius muscles is in the symptoms.  We discussed heat, massage, relaxation and reduction of use of that shoulder may help.  You are also being prescribed anti-inflammatory ibuprofen as well as muscle relaxer Flexeril.  Return to the emergency room immediately for any worsening condition including any true weakness or numbness, or any new or worsening or uncontrolled pain, any chest pain, trouble breathing, fever, skin rash, or any other symptoms concerning to you.    ___________________________________________   FINAL CLINICAL IMPRESSION(S) / ED DIAGNOSES   Final diagnoses:  Trapezius muscle spasm  Hypertension, unspecified type      ___________________________________________         Note: This dictation was prepared with Dragon dictation. Any transcriptional errors that result from this process are unintentional    Lisa Roca, MD 05/12/18 1219

## 2018-08-16 ENCOUNTER — Other Ambulatory Visit: Payer: Self-pay

## 2018-08-16 ENCOUNTER — Emergency Department
Admission: EM | Admit: 2018-08-16 | Discharge: 2018-08-16 | Disposition: A | Discharge status: Home / Self Care | Payer: Self-pay | Attending: Emergency Medicine | Admitting: Emergency Medicine

## 2018-08-16 DIAGNOSIS — I10 Essential (primary) hypertension: Secondary | ICD-10-CM | POA: Insufficient documentation

## 2018-08-16 DIAGNOSIS — H65112 Acute and subacute allergic otitis media (mucoid) (sanguinous) (serous), left ear: Secondary | ICD-10-CM | POA: Insufficient documentation

## 2018-08-16 DIAGNOSIS — R6884 Jaw pain: Secondary | ICD-10-CM | POA: Insufficient documentation

## 2018-08-16 DIAGNOSIS — F329 Major depressive disorder, single episode, unspecified: Secondary | ICD-10-CM | POA: Insufficient documentation

## 2018-08-16 DIAGNOSIS — F17210 Nicotine dependence, cigarettes, uncomplicated: Secondary | ICD-10-CM | POA: Insufficient documentation

## 2018-08-16 DIAGNOSIS — J0100 Acute maxillary sinusitis, unspecified: Secondary | ICD-10-CM | POA: Insufficient documentation

## 2018-08-16 DIAGNOSIS — Z853 Personal history of malignant neoplasm of breast: Secondary | ICD-10-CM | POA: Insufficient documentation

## 2018-08-16 DIAGNOSIS — Z79899 Other long term (current) drug therapy: Secondary | ICD-10-CM | POA: Insufficient documentation

## 2018-08-16 MED ORDER — CETIRIZINE HCL 10 MG PO TABS: 10.0000 mg | ORAL_TABLET | Freq: Every day | ORAL | 0 refills | Status: DC

## 2018-08-16 MED ORDER — FLUTICASONE PROPIONATE 50 MCG/ACT NA SUSP: 1.0000 | Freq: Two times a day (BID) | NASAL | 0 refills | Status: DC

## 2018-08-16 MED ORDER — CEFDINIR 300 MG PO CAPS: 300.0000 mg | ORAL_CAPSULE | Freq: Two times a day (BID) | ORAL | 0 refills | Status: DC

## 2018-08-16 MED ORDER — TRAMADOL HCL 50 MG PO TABS: 50.0000 mg | ORAL_TABLET | Freq: Four times a day (QID) | ORAL | 0 refills | Status: DC | PRN

## 2018-08-16 NOTE — ED Triage Notes (Signed)
States L sided dental pain. States "my teeth are really bad anyway." also c/o L ear ache. States recent bronchitis. Appears congested. A&O, ambulatory.

## 2018-08-16 NOTE — ED Provider Notes (Signed)
[facility] Emergency Department Provider Note  ____________________________________________  Time seen: Approximately 4:15 PM  I have reviewed the triage vital signs and the nursing notes.   HISTORY  Chief Complaint Dental Pain and Otalgia    HPI Kelsey Russo is a 52 y.o. female who presents the emergency department complaining of nasal congestion, left ear pain, left jaw pain, cough.  Patient reports that she has been dealing with "cold-like" symptoms x2 weeks.  Patient states that she had a previous prescription of clindamycin from an unrelated infection at the house, she took these with no relief.  Patient reports that she has been taking ibuprofen for the pain.  She took the clindamycin for supposed infection.  No other medications.  Patient denies any headache, visual changes, neck pain or stiffness, chest pain, shortness of breath, domino pain, nausea or vomiting, diarrhea or constipation.    Past Medical History:  Diagnosis Date  . Breast cancer [facility])    Status post right breast lumpectomy and radiation  . Depression   . History of seizure disorder   . Hypertension     Patient Active Problem List   Diagnosis Date Noted  . Altered mental status 07/13/2015    Past Surgical History:  Procedure Laterality Date  . Brain tumor resection    . Right breast lumpectomy      Prior to Admission medications   Medication Sig Start Date End Date Taking? Authorizing Provider  albuterol (PROVENTIL HFA;VENTOLIN HFA) 108 (90 Base) MCG/ACT inhaler Inhale 2 puffs into the lungs every 6 (six) hours as needed for wheezing or shortness of breath. 06/26/17   Menshew, Dannielle Karvonen, PA-C  amLODipine (NORVASC) 5 MG tablet Take 1 tablet (5 mg total) by mouth daily. 04/17/18 06/16/18  Laban Emperor, PA-C  benzonatate (TESSALON PERLES) 100 MG capsule Take 1 capsule (100 mg total) by mouth 3 (three) times daily as needed for cough (Take 1-2 per dose). 06/26/17    Menshew, Dannielle Karvonen, PA-C  cefdinir (OMNICEF) 300 MG capsule Take 1 capsule (300 mg total) by mouth 2 (two) times daily. 08/16/18   , Charline Bills, PA-C  cetirizine (ZYRTEC) 10 MG tablet Take 1 tablet (10 mg total) by mouth daily. 08/16/18   , Charline Bills, PA-C  cyclobenzaprine (FLEXERIL) 10 MG tablet Take 1 tablet (10 mg total) by mouth 3 (three) times daily as needed for muscle spasms. 05/12/18   Lisa Roca, MD  fluticasone (FLONASE) 50 MCG/ACT nasal spray Place 1 spray into both nostrils 2 (two) times daily. 08/16/18   , Charline Bills, PA-C  ibuprofen (ADVIL,MOTRIN) 800 MG tablet Take 1 tablet (800 mg total) by mouth every 8 (eight) hours as needed. 05/12/18   Lisa Roca, MD  Melatonin 5 MG TABS Take 1 tablet by mouth at bedtime and may repeat dose one time if needed.    [provider]  traMADol (ULTRAM) 50 MG tablet Take 1 tablet (50 mg total) by mouth every 6 (six) hours as needed. 08/16/18   , Charline Bills, PA-C    Allergies Imitrex [sumatriptan] and Penicillins  Family History  Problem Relation Age of Onset  . Depression Mother   . CAD Father   . Hypertension Father   . Lung cancer Father   . Breast cancer Maternal Grandmother     Social History Social History   Tobacco Use  . Smoking status: Current Every Day Smoker    Packs/day: 0.50    Types: Cigarettes  . Smokeless tobacco:  Never Used  Substance Use Topics  . Alcohol use: No    Alcohol/week: 0.0 standard drinks  . Drug use: No    Comment: Marijuana     Review of Systems  Constitutional: No fever/chills Eyes: No visual changes. No discharge ENT: Positive for nasal congestion, left ear pain, left jaw pain. Cardiovascular: no chest pain. Respiratory: Positive cough. No SOB. Gastrointestinal: No abdominal pain.  No nausea, no vomiting.  No diarrhea.  No constipation. Genitourinary: Negative for dysuria. No hematuria Musculoskeletal: Negative for musculoskeletal  pain. Skin: Negative for rash, abrasions, lacerations, ecchymosis. Neurological: Negative for headaches, focal weakness or numbness. 10-point ROS otherwise negative.  ____________________________________________   PHYSICAL EXAM:  VITAL SIGNS: ED Triage Vitals [08/16/18 1512]  Enc Vitals Group     BP (!) 161/102     Pulse Rate 95     Resp 18     Temp 98.6 F (37 C)     Temp Source Oral     SpO2 96 %     Weight 145 lb (65.8 kg)     Height 5\' 6"  (1.676 m)     Head Circumference      Peak Flow      Pain Score 8     Pain Loc      Pain Edu?      Excl. in [place]?      Constitutional: Alert and oriented. Well appearing and in no acute distress. Eyes: Conjunctivae are normal. PERRL. EOMI. Head: Atraumatic. ENT:      Ears: EACs unremarkable bilaterally.  TM on right is bulging but not injected.  TM on left is injected with bulging with mucoid air-fluid level.      Nose: Moderate to significant congestion/rhinnorhea.  Turbinates are erythematous and edematous.  Patient is mildly tender to percussion of the left maxillary sinus.      Mouth/Throat: Mucous membranes are moist.  Oropharynx is nonerythematous and nonedematous.  Tonsils are unremarkable.  Uvula is midline.  Patient has poor dentition throughout.  No significant erythema or edema concerning for dental infection.  No indication of deep space infection such as Ludwick's angina. Neck: No stridor.  Neck is supple full range of motion Hematological/Lymphatic/Immunilogical: No cervical lymphadenopathy. Cardiovascular: Normal rate, regular rhythm. Normal S1 and S2.  No murmurs, rubs, gallops.  No apical heave.  Good peripheral circulation. Respiratory: Normal respiratory effort without tachypnea or retractions. Lungs CTAB. Good air entry to the bases with no decreased or absent breath sounds. Musculoskeletal: Full range of motion to all extremities. No gross deformities appreciated. Neurologic:  Normal speech and language. No gross  focal neurologic deficits are appreciated.  Skin:  Skin is warm, dry and intact. No rash noted. Psychiatric: Mood and affect are normal. Speech and behavior are normal. Patient exhibits appropriate insight and judgement.   ____________________________________________   LABS (all labs ordered are listed, but only abnormal results are displayed)  Labs Reviewed - No data to display ____________________________________________  EKG   ____________________________________________  RADIOLOGY   No results found.  ____________________________________________    PROCEDURES  Procedure(s) performed:    Procedures    Medications - No data to display   ____________________________________________   INITIAL IMPRESSION / ASSESSMENT AND PLAN / ED COURSE  Pertinent labs & imaging results that were available during my care of the patient were reviewed by me and considered in my medical decision making (see chart for details).  Review of the [place] CSRS was performed in accordance of the [place] prior to  dispensing any controlled drugs.      Patient's diagnosis is consistent with sinusitis, left-sided otitis media, left jaw pain.  Patient presents emergency department with multiple ENT/URI complaints.  On exam, no indication of dental infection.  Patient does have findings consistent with mild bacterial sinusitis and left-sided otitis media.  Differential included viral URI, bacterial sinusitis, otitis media, dental infection, strep, peritonsillar abscess, bronchitis, pneumonia, ACS/STEMI.  I suspect that left jaw pain is secondary to infections.  Patient denies any cardiac issues.  No chest pain or shortness of breath.  Patient has been experiencing URI symptoms x2 weeks.  She took a course of clindamycin with no improvement.  As this has not helped with dental pain, no indication of dental infection on exam, and clindamycin is not the best medication for otitis media and sinusitis, I suspect  that symptoms are all likely due to these two issues.  No indication for further work-up at this time.  Patient was placed on cefdinir (as she is allergic to penicillin based antibiotics with a rash), Flonase, Zyrtec, limited prescription of Ultram for pain.  Follow-up with primary care as needed..  Patient is given ED precautions to return to the ED for any worsening or new symptoms.     ____________________________________________  FINAL CLINICAL IMPRESSION(S) / ED DIAGNOSES  Final diagnoses:  Acute mucoid otitis media of left ear  Acute non-recurrent maxillary sinusitis  Jaw pain      NEW MEDICATIONS STARTED DURING THIS VISIT:  ED Discharge Orders         Ordered    fluticasone (FLONASE) 50 MCG/ACT nasal spray  2 times daily     08/16/18 1650    cefdinir (OMNICEF) 300 MG capsule  2 times daily     08/16/18 1650    cetirizine (ZYRTEC) 10 MG tablet  Daily     08/16/18 1650    traMADol (ULTRAM) 50 MG tablet  Every 6 hours PRN     08/16/18 1650              This chart was dictated using voice recognition software/Dragon. Despite best efforts to proofread, errors can occur which can change the meaning. Any change was purely unintentional.    Darletta Moll, PA-C 08/16/18 1650    Lavonia Drafts, MD 08/16/18 [phone]

## 2019-01-04 ENCOUNTER — Emergency Department
Admission: EM | Admit: 2019-01-04 | Discharge: 2019-01-04 | Disposition: A | Discharge status: Home / Self Care | Payer: Self-pay | Attending: Emergency Medicine | Admitting: Emergency Medicine

## 2019-01-04 ENCOUNTER — Encounter: Payer: Self-pay | Admitting: Emergency Medicine

## 2019-01-04 ENCOUNTER — Other Ambulatory Visit: Payer: Self-pay

## 2019-01-04 DIAGNOSIS — Z853 Personal history of malignant neoplasm of breast: Secondary | ICD-10-CM | POA: Insufficient documentation

## 2019-01-04 DIAGNOSIS — K0889 Other specified disorders of teeth and supporting structures: Secondary | ICD-10-CM

## 2019-01-04 DIAGNOSIS — Z79899 Other long term (current) drug therapy: Secondary | ICD-10-CM | POA: Insufficient documentation

## 2019-01-04 DIAGNOSIS — F17210 Nicotine dependence, cigarettes, uncomplicated: Secondary | ICD-10-CM | POA: Insufficient documentation

## 2019-01-04 DIAGNOSIS — I10 Essential (primary) hypertension: Secondary | ICD-10-CM | POA: Insufficient documentation

## 2019-01-04 MED ORDER — AMLODIPINE BESYLATE 5 MG PO TABS: 5.0000 mg | ORAL_TABLET | Freq: Every day | ORAL | 1 refills | Status: DC

## 2019-01-04 MED ORDER — HYDROCODONE-ACETAMINOPHEN 5-325 MG PO TABS: 1.0000 | ORAL_TABLET | Freq: Four times a day (QID) | ORAL | 0 refills | Status: AC | PRN

## 2019-01-04 MED ORDER — HYDROCODONE-ACETAMINOPHEN 5-325 MG PO TABS
1.0000 | ORAL_TABLET | Freq: Once | ORAL | Status: DC
  Filled 2019-01-04: qty 1

## 2019-01-04 MED ORDER — SULFAMETHOXAZOLE-TRIMETHOPRIM 800-160 MG PO TABS: 1.0000 | ORAL_TABLET | Freq: Two times a day (BID) | ORAL | 0 refills | Status: DC

## 2019-01-04 NOTE — ED Triage Notes (Signed)
FIRST NURSE NOTE-jaw swelling/pain. Poor dentition. NAD

## 2019-01-04 NOTE — ED Notes (Signed)
Patient's discharge and follow up information reviewed with patient by ED nursing staff and patient given the opportunity to ask questions pertaining to ED visit and discharge plan of care. Patient advised that should symptoms not continue to improve, resolve entirely, or should new symptoms develop then a follow up visit with their PCP or a return visit to the ED may be warranted. Patient verbalized consent and understanding of discharge plan of care including potential need for further evaluation. Patient discharged in stable condition per attending ED physician on duty.   EDP aware of elevated BP prior to pt leaving.

## 2019-01-04 NOTE — ED Provider Notes (Signed)
[facility] Emergency Department Provider Note ____________________________________________  Time seen: Approximately 1:40 PM  I have reviewed the triage vital signs and the nursing notes.   HISTORY  Chief Complaint Dental Pain   HPI Kelsey Russo is a 53 y.o. female who presents to the emergency department for treatment and evaluation of dental pain.  Pain started Friday.  No relief with over-the-counter medications.   Past Medical History:  Diagnosis Date  . Breast cancer [facility])    Status post right breast lumpectomy and radiation  . Depression   . History of seizure disorder   . Hypertension     Patient Active Problem List   Diagnosis Date Noted  . Altered mental status 07/13/2015    Past Surgical History:  Procedure Laterality Date  . Brain tumor resection    . Right breast lumpectomy      Prior to Admission medications   Medication Sig Start Date End Date Taking? Authorizing Provider  albuterol (PROVENTIL HFA;VENTOLIN HFA) 108 (90 Base) MCG/ACT inhaler Inhale 2 puffs into the lungs every 6 (six) hours as needed for wheezing or shortness of breath. 06/26/17   Menshew, Dannielle Karvonen, PA-C  amLODipine (NORVASC) 5 MG tablet Take 1 tablet (5 mg total) by mouth daily. 01/04/19 03/05/19  Ande Therrell, Dessa Phi, FNP  benzonatate (TESSALON PERLES) 100 MG capsule Take 1 capsule (100 mg total) by mouth 3 (three) times daily as needed for cough (Take 1-2 per dose). 06/26/17   Menshew, Dannielle Karvonen, PA-C  cefdinir (OMNICEF) 300 MG capsule Take 1 capsule (300 mg total) by mouth 2 (two) times daily. 08/16/18   Cuthriell, Charline Bills, PA-C  cetirizine (ZYRTEC) 10 MG tablet Take 1 tablet (10 mg total) by mouth daily. 08/16/18   Cuthriell, Charline Bills, PA-C  cyclobenzaprine (FLEXERIL) 10 MG tablet Take 1 tablet (10 mg total) by mouth 3 (three) times daily as needed for muscle spasms. 05/12/18   Lisa Roca, MD  fluticasone (FLONASE) 50 MCG/ACT nasal spray Place 1 spray  into both nostrils 2 (two) times daily. 08/16/18   Cuthriell, Charline Bills, PA-C  HYDROcodone-acetaminophen (NORCO/VICODIN) 5-325 MG tablet Take 1 tablet by mouth every 6 (six) hours as needed for up to 3 days for severe pain. 01/04/19 01/07/19  Masen Salvas, Johnette Abraham B, FNP  ibuprofen (ADVIL,MOTRIN) 800 MG tablet Take 1 tablet (800 mg total) by mouth every 8 (eight) hours as needed. 05/12/18   Lisa Roca, MD  Melatonin 5 MG TABS Take 1 tablet by mouth at bedtime and may repeat dose one time if needed.    [provider]  sulfamethoxazole-trimethoprim (BACTRIM DS) 800-160 MG tablet Take 1 tablet by mouth 2 (two) times daily. 01/04/19   Nazyia Gaugh B, FNP  traMADol (ULTRAM) 50 MG tablet Take 1 tablet (50 mg total) by mouth every 6 (six) hours as needed. 08/16/18   Cuthriell, Charline Bills, PA-C    Allergies Imitrex [sumatriptan] and Penicillins  Family History  Problem Relation Age of Onset  . Depression Mother   . CAD Father   . Hypertension Father   . Lung cancer Father   . Breast cancer Maternal Grandmother     Social History Social History   Tobacco Use  . Smoking status: Current Every Day Smoker    Packs/day: 0.50    Types: Cigarettes  . Smokeless tobacco: Never Used  Substance Use Topics  . Alcohol use: No    Alcohol/week: 0.0 standard drinks  . Drug use: No    Comment:  Marijuana    Review of Systems Constitutional: Negative for fever or recent illness. ENT: Positive for dental pain. Musculoskeletal: Negative for trismus of the jaw.  Skin: Negative for wound or lesion. ____________________________________________   PHYSICAL EXAM:  VITAL SIGNS: ED Triage Vitals  Enc Vitals Group     BP 01/04/19 1316 (!) 197/126     Pulse Rate 01/04/19 1316 97     Resp 01/04/19 1316 17     Temp 01/04/19 1316 98 F (36.7 C)     Temp Source 01/04/19 1316 Oral     SpO2 01/04/19 1316 98 %     Weight 01/04/19 1320 150 lb (68 kg)     Height 01/04/19 1320 5\' 6"  (1.676 m)     Head  Circumference --      Peak Flow --      Pain Score 01/04/19 1324 9     Pain Loc --      Pain Edu? --      Excl. in [place]? --     Constitutional: Alert and oriented. Well appearing and in no acute distress. Eyes: Conjunctiva are clear without discharge or drainage. Mouth/Throat: Airway is patent.  Sublingual surface is soft. Periodontal Exam    Hematological/Lymphatic/Immunilogical: No palpable nodes. Respiratory: Respirations even and unlabored. Musculoskeletal: Full ROM of the jaw. Neurologic: Awake, alert, oriented.  Skin:  No facial swelling or erythema Psychiatric: Affect and behavior intact.  ____________________________________________   LABS (all labs ordered are listed, but only abnormal results are displayed)  Labs Reviewed - No data to display ____________________________________________   RADIOLOGY  Not indicated. ____________________________________________   PROCEDURES  Procedure(s) performed:   Procedures  Critical Care performed: No ____________________________________________   INITIAL IMPRESSION / ASSESSMENT AND PLAN / ED COURSE  Kelsey Russo is a 53 y.o. female who presents to the emergency department for treatment and evaluation of dental pain. She will be treated with antibiotics and pain meds. She is to call and schedule an appointment with one of the dental clinics. Community resources provided.   Also of note, she is hypertensive today. I will refill her Norvasc. She is asymptomatic. She plans to see someone at [facility] when she can get an appointment.  Pertinent labs & imaging results that were available during my care of the patient were reviewed by me and considered in my medical decision making (see chart for details).  ____________________________________________   FINAL CLINICAL IMPRESSION(S) / ED DIAGNOSES  Final diagnoses:  Hypertension, unspecified type  Pain, dental    Discharge Medication List as of  01/04/2019  1:53 PM    START taking these medications   Details  HYDROcodone-acetaminophen (NORCO/VICODIN) 5-325 MG tablet Take 1 tablet by mouth every 6 (six) hours as needed for up to 3 days for severe pain., Starting Sun 01/04/2019, Until Wed 01/07/2019, Normal    sulfamethoxazole-trimethoprim (BACTRIM DS) 800-160 MG tablet Take 1 tablet by mouth 2 (two) times daily., Starting Sun 01/04/2019, Normal        If controlled substance prescribed during this visit, 12 month history viewed on the [place] prior to issuing an initial prescription for Schedule II or III opiod.  Note:  This document was prepared using Dragon voice recognition software and may include unintentional dictation errors.   Victorino Dike, FNP 01/04/19 1422    Schuyler Amor, MD 01/04/19 1517

## 2019-01-04 NOTE — ED Triage Notes (Signed)
PT arrives with complaints of left sided jaw pain that pt attribute to her "poor dental condition." Pt states the pain started Friday. Pt hypertensive in triage but states she has been out of her HTN medication for 3 weeks. No complaints of headache, shortness of breath, or chest pain.

## 2019-04-15 ENCOUNTER — Telehealth: Payer: Self-pay | Admitting: Pharmacy Technician

## 2019-04-15 NOTE — Telephone Encounter (Signed)
Patient failed to provide2020 financial documentation. No additional medication assistance will be provided by [facility] without the required proof of income documentation. Patient notified by letter.  Velda Shell, CPhT [facility]

## 2019-05-22 ENCOUNTER — Emergency Department
Admission: EM | Admit: 2019-05-22 | Discharge: 2019-05-22 | Disposition: A | Discharge status: Home / Self Care | Payer: Self-pay | Attending: Student in an Organized Health Care Education/Training Program | Admitting: Student in an Organized Health Care Education/Training Program

## 2019-05-22 ENCOUNTER — Other Ambulatory Visit: Payer: Self-pay

## 2019-05-22 ENCOUNTER — Encounter: Payer: Self-pay | Admitting: *Deleted

## 2019-05-22 DIAGNOSIS — Z79899 Other long term (current) drug therapy: Secondary | ICD-10-CM | POA: Insufficient documentation

## 2019-05-22 DIAGNOSIS — Z853 Personal history of malignant neoplasm of breast: Secondary | ICD-10-CM | POA: Insufficient documentation

## 2019-05-22 DIAGNOSIS — M5441 Lumbago with sciatica, right side: Secondary | ICD-10-CM | POA: Insufficient documentation

## 2019-05-22 DIAGNOSIS — I10 Essential (primary) hypertension: Secondary | ICD-10-CM | POA: Insufficient documentation

## 2019-05-22 DIAGNOSIS — F17210 Nicotine dependence, cigarettes, uncomplicated: Secondary | ICD-10-CM | POA: Insufficient documentation

## 2019-05-22 MED ORDER — CYCLOBENZAPRINE HCL 5 MG PO TABS: ORAL_TABLET | ORAL | 0 refills | Status: DC

## 2019-05-22 MED ORDER — PREDNISONE 10 MG PO TABS: ORAL_TABLET | ORAL | 0 refills | Status: DC

## 2019-05-22 MED ORDER — KETOROLAC TROMETHAMINE 10 MG PO TABS: 10.0000 mg | ORAL_TABLET | Freq: Four times a day (QID) | ORAL | 0 refills | Status: DC | PRN

## 2019-05-22 MED ORDER — KETOROLAC TROMETHAMINE 30 MG/ML IJ SOLN
30.0000 mg | Freq: Once | INTRAMUSCULAR | Status: AC
  Administered 2019-05-22: 30 mg via INTRAMUSCULAR
  Filled 2019-05-22: qty 1

## 2019-05-22 NOTE — Discharge Instructions (Signed)
You can take Toradol for pain and inflammation.  Do not take any additional ibuprofen.  He can take Flexeril to help relax her muscles.  Do not work or drive on Flexeril.  Begin steroids for inflammation.

## 2019-05-22 NOTE — ED Triage Notes (Signed)
Patient reports she woke up with a stiff lower back 2 days ago and is now radiating to right hip. Patient states she has been off work for two days and has used NSAIDS and lidocaine patches without relief.

## 2019-05-22 NOTE — ED Provider Notes (Signed)
[facility] Emergency Department Provider Note  ____________________________________________  Time seen: Approximately 12:13 PM  I have reviewed the triage vital signs and the nursing notes.   HISTORY  Chief Complaint Back Pain    HPI Kelsey Russo is a 53 y.o. female that presents to the emergency department for evaluation of low back pain that radiates into her right hip for 2 days.  Pain is worse with walking and movement.  Back pain feels similar to previous.  Patient states that she has been out of work for 2 days and needs to go back to work.  No bowel or bladder dysfunction or saddle anesthesias.  No fever, nausea, vomiting, abdominal pain, urinary symptoms, numbness, tingling.   Past Medical History:  Diagnosis Date  . Breast cancer [facility])    Status post right breast lumpectomy and radiation  . Depression   . History of seizure disorder   . Hypertension     Patient Active Problem List   Diagnosis Date Noted  . Altered mental status 07/13/2015    Past Surgical History:  Procedure Laterality Date  . Brain tumor resection    . Right breast lumpectomy      Prior to Admission medications   Medication Sig Start Date End Date Taking? Authorizing Provider  albuterol (PROVENTIL HFA;VENTOLIN HFA) 108 (90 Base) MCG/ACT inhaler Inhale 2 puffs into the lungs every 6 (six) hours as needed for wheezing or shortness of breath. 06/26/17   Menshew, Dannielle Karvonen, PA-C  amLODipine (NORVASC) 5 MG tablet Take 1 tablet (5 mg total) by mouth daily. 01/04/19 03/05/19  Triplett, Dessa Phi, FNP  benzonatate (TESSALON PERLES) 100 MG capsule Take 1 capsule (100 mg total) by mouth 3 (three) times daily as needed for cough (Take 1-2 per dose). 06/26/17   Menshew, Dannielle Karvonen, PA-C  cefdinir (OMNICEF) 300 MG capsule Take 1 capsule (300 mg total) by mouth 2 (two) times daily. 08/16/18   Cuthriell, Charline Bills, PA-C  cetirizine (ZYRTEC) 10 MG tablet Take 1 tablet (10 mg  total) by mouth daily. 08/16/18   Cuthriell, Charline Bills, PA-C  cyclobenzaprine (FLEXERIL) 5 MG tablet Take 1-2 tablets 3 times daily as needed 05/22/19   Laban Emperor, PA-C  fluticasone [facility]) 50 MCG/ACT nasal spray Place 1 spray into both nostrils 2 (two) times daily. 08/16/18   Cuthriell, Charline Bills, PA-C  ibuprofen (ADVIL,MOTRIN) 800 MG tablet Take 1 tablet (800 mg total) by mouth every 8 (eight) hours as needed. 05/12/18   Lisa Roca, MD  ketorolac (TORADOL) 10 MG tablet Take 1 tablet (10 mg total) by mouth every 6 (six) hours as needed. 05/22/19   Laban Emperor, PA-C  Melatonin 5 MG TABS Take 1 tablet by mouth at bedtime and may repeat dose one time if needed.    [provider]  predniSONE (DELTASONE) 10 MG tablet Take 6 tablets day 1, take 5 tablets day 2, take 4 tablets day 3, take 3 tablets day 4, take 2 tablets day 5, take 1 tablet day 6 05/22/19   Laban Emperor, PA-C  sulfamethoxazole-trimethoprim (BACTRIM DS) 800-160 MG tablet Take 1 tablet by mouth 2 (two) times daily. 01/04/19   Triplett, Cari B, FNP  traMADol (ULTRAM) 50 MG tablet Take 1 tablet (50 mg total) by mouth every 6 (six) hours as needed. 08/16/18   Cuthriell, Charline Bills, PA-C    Allergies Imitrex [sumatriptan] and Penicillins  Family History  Problem Relation Age of Onset  . Depression Mother   .  CAD Father   . Hypertension Father   . Lung cancer Father   . Breast cancer Maternal Grandmother     Social History Social History   Tobacco Use  . Smoking status: Current Every Day Smoker    Packs/day: 0.50    Types: Cigarettes  . Smokeless tobacco: Never Used  Substance Use Topics  . Alcohol use: No    Alcohol/week: 0.0 standard drinks  . Drug use: No    Comment: Marijuana     Review of Systems  Constitutional: No fever/chills Cardiovascular: No chest pain. Respiratory: No SOB. Gastrointestinal: No abdominal pain.  No nausea, no vomiting.  Musculoskeletal: Positive for back pain.  Skin:  Negative for rash, abrasions, lacerations, ecchymosis. Neurological: Negative for headaches, numbness or tingling   ____________________________________________   PHYSICAL EXAM:  VITAL SIGNS: ED Triage Vitals  Enc Vitals Group     BP 05/22/19 1138 136/90     Pulse Rate 05/22/19 1138 88     Resp 05/22/19 1138 16     Temp 05/22/19 1138 97.8 F (36.6 C)     Temp Source 05/22/19 1138 Oral     SpO2 05/22/19 1138 100 %     Weight 05/22/19 1142 155 lb (70.3 kg)     Height 05/22/19 1142 5\' 7"  (1.702 m)     Head Circumference --      Peak Flow --      Pain Score 05/22/19 1141 7     Pain Loc --      Pain Edu? --      Excl. in [place]? --      Constitutional: Alert and oriented. Well appearing and in no acute distress. Eyes: Conjunctivae are normal. PERRL. EOMI. Head: Atraumatic. ENT:      Ears:      Nose: No congestion/rhinnorhea.      Mouth/Throat: Mucous membranes are moist.  Neck: No stridor.   Cardiovascular: Normal rate, regular rhythm.  Good peripheral circulation. Respiratory: Normal respiratory effort without tachypnea or retractions. Lungs CTAB. Good air entry to the bases with no decreased or absent breath sounds. Gastrointestinal: Bowel sounds 4 quadrants. Soft and nontender to palpation. No guarding or rigidity. No palpable masses. No distention. No CVA tenderness. Musculoskeletal: Full range of motion to all extremities. No gross deformities appreciated. Tenderness to palpation to lumbar spine and right lumbar paraspinal muscles. Strength equal in lower extremities bilaterally. No foot drop. Antalgic gait.  Neurologic:  Normal speech and language. No gross focal neurologic deficits are appreciated.  Skin:  Skin is warm, dry and intact. No rash noted. Psychiatric: Mood and affect are normal. Speech and behavior are normal. Patient exhibits appropriate insight and judgement.   ____________________________________________   LABS (all labs ordered are listed, but only  abnormal results are displayed)  Labs Reviewed - No data to display ____________________________________________  EKG   ____________________________________________  RADIOLOGY   No results found.  ____________________________________________    PROCEDURES  Procedure(s) performed:    Procedures    Medications  ketorolac (TORADOL) 30 MG/ML injection 30 mg (30 mg Intramuscular Given 05/22/19 1231)     ____________________________________________   INITIAL IMPRESSION / ASSESSMENT AND PLAN / ED COURSE  Pertinent labs & imaging results that were available during my care of the patient were reviewed by me and considered in my medical decision making (see chart for details).  Review of the [place] CSRS was performed in accordance of the [place] prior to dispensing any controlled drugs.    Patient's diagnosis is  consistent with sciatica. Vital signs and exam are reassuring. Patient declines imagining at this time. IM toradol was given for pain. Patient will be discharged home with prescriptions for toradol, flexeril, prednisone. Patient is to follow up with PCP or ortho as directed. Patient is given ED precautions to return to the ED for any worsening or new symptoms.  Kelsey Russo was evaluated in Emergency Department on 05/22/2019 for the symptoms described in the history of present illness. She was evaluated in the context of the global COVID-19 pandemic, which necessitated consideration that the patient might be at risk for infection with the SARS-CoV-2 virus that causes COVID-19. Institutional protocols and algorithms that pertain to the evaluation of patients at risk for COVID-19 are in a state of rapid change based on information released by regulatory bodies including the CDC and federal and state organizations. These policies and algorithms were followed during the patient's care in the ED.   ____________________________________________  FINAL CLINICAL IMPRESSION(S) / ED  DIAGNOSES  Final diagnoses:  Acute right-sided low back pain with right-sided sciatica      NEW MEDICATIONS STARTED DURING THIS VISIT:  ED Discharge Orders         Ordered    ketorolac (TORADOL) 10 MG tablet  Every 6 hours PRN     05/22/19 1221    cyclobenzaprine (FLEXERIL) 5 MG tablet     05/22/19 1221    predniSONE (DELTASONE) 10 MG tablet     05/22/19 1221              This chart was dictated using voice recognition software/Dragon. Despite best efforts to proofread, errors can occur which can change the meaning. Any change was purely unintentional.    Laban Emperor, PA-C 05/22/19 1806    Merlyn Lot, MD 05/25/19 1004

## 2019-07-07 ENCOUNTER — Other Ambulatory Visit: Payer: Self-pay

## 2019-07-07 ENCOUNTER — Emergency Department
Admission: EM | Admit: 2019-07-07 | Discharge: 2019-07-07 | Disposition: A | Discharge status: Home / Self Care | Payer: Self-pay | Attending: Emergency Medicine | Admitting: Emergency Medicine

## 2019-07-07 ENCOUNTER — Encounter: Payer: Self-pay | Admitting: *Deleted

## 2019-07-07 DIAGNOSIS — H66001 Acute suppurative otitis media without spontaneous rupture of ear drum, right ear: Secondary | ICD-10-CM | POA: Insufficient documentation

## 2019-07-07 DIAGNOSIS — F17210 Nicotine dependence, cigarettes, uncomplicated: Secondary | ICD-10-CM | POA: Insufficient documentation

## 2019-07-07 DIAGNOSIS — Z853 Personal history of malignant neoplasm of breast: Secondary | ICD-10-CM | POA: Insufficient documentation

## 2019-07-07 DIAGNOSIS — Z79899 Other long term (current) drug therapy: Secondary | ICD-10-CM | POA: Insufficient documentation

## 2019-07-07 MED ORDER — CEPHALEXIN 500 MG PO CAPS: 500.0000 mg | ORAL_CAPSULE | Freq: Three times a day (TID) | ORAL | 0 refills | Status: AC

## 2019-07-07 MED ORDER — CEPHALEXIN 500 MG PO CAPS
500.0000 mg | ORAL_CAPSULE | Freq: Once | ORAL | Status: AC
  Administered 2019-07-07: 500 mg via ORAL
  Filled 2019-07-07: qty 1

## 2019-07-07 NOTE — ED Triage Notes (Signed)
Pt has right earache.  Sx for 4 days.  Pt alert.

## 2019-07-07 NOTE — Discharge Instructions (Signed)
Please follow up with the primary care provider of your choice if not improving over the next few days.  Return to the ER for symptoms that change or worsen if unable to schedule an appointment.

## 2019-07-07 NOTE — ED Provider Notes (Signed)
[facility] Emergency Department Provider Note ____________________________________________  Time seen: Approximately 9:14 PM  I have reviewed the triage vital signs and the nursing notes.   HISTORY  Chief Complaint Otalgia    HPI Kelsey Russo is a 53 y.o. female who presents to the emergency department for treatment and evaluation of right earache x 4 days. No relief with over-the-counter medications. She is normally hard of hearing, but hearing seems to have decreased since pain started. No fever, chills, nausea, or other symptoms of concern.   Past Medical History:  Diagnosis Date  . Breast cancer [facility])    Status post right breast lumpectomy and radiation  . Depression   . History of seizure disorder   . Hypertension     Patient Active Problem List   Diagnosis Date Noted  . Altered mental status 07/13/2015    Past Surgical History:  Procedure Laterality Date  . Brain tumor resection    . Right breast lumpectomy      Prior to Admission medications   Medication Sig Start Date End Date Taking? Authorizing Provider  albuterol (PROVENTIL HFA;VENTOLIN HFA) 108 (90 Base) MCG/ACT inhaler Inhale 2 puffs into the lungs every 6 (six) hours as needed for wheezing or shortness of breath. 06/26/17   Menshew, Dannielle Karvonen, PA-C  amLODipine (NORVASC) 5 MG tablet Take 1 tablet (5 mg total) by mouth daily. 01/04/19 03/05/19  Yahaira Bruski, Dessa Phi, FNP  benzonatate (TESSALON PERLES) 100 MG capsule Take 1 capsule (100 mg total) by mouth 3 (three) times daily as needed for cough (Take 1-2 per dose). 06/26/17   Menshew, Dannielle Karvonen, PA-C  cefdinir (OMNICEF) 300 MG capsule Take 1 capsule (300 mg total) by mouth 2 (two) times daily. 08/16/18   Cuthriell, Charline Bills, PA-C  cephALEXin (KEFLEX) 500 MG capsule Take 1 capsule (500 mg total) by mouth 3 (three) times daily for 10 days. 07/07/19 07/17/19  Lesley Atkin, Johnette Abraham B, FNP  cetirizine (ZYRTEC) 10 MG tablet Take 1 tablet (10  mg total) by mouth daily. 08/16/18   Cuthriell, Charline Bills, PA-C  cyclobenzaprine (FLEXERIL) 5 MG tablet Take 1-2 tablets 3 times daily as needed 05/22/19   Laban Emperor, PA-C  fluticasone [facility]) 50 MCG/ACT nasal spray Place 1 spray into both nostrils 2 (two) times daily. 08/16/18   Cuthriell, Charline Bills, PA-C  ibuprofen (ADVIL,MOTRIN) 800 MG tablet Take 1 tablet (800 mg total) by mouth every 8 (eight) hours as needed. 05/12/18   Lisa Roca, MD  ketorolac (TORADOL) 10 MG tablet Take 1 tablet (10 mg total) by mouth every 6 (six) hours as needed. 05/22/19   Laban Emperor, PA-C  Melatonin 5 MG TABS Take 1 tablet by mouth at bedtime and may repeat dose one time if needed.    [provider]  predniSONE (DELTASONE) 10 MG tablet Take 6 tablets day 1, take 5 tablets day 2, take 4 tablets day 3, take 3 tablets day 4, take 2 tablets day 5, take 1 tablet day 6 05/22/19   Laban Emperor, PA-C  sulfamethoxazole-trimethoprim (BACTRIM DS) 800-160 MG tablet Take 1 tablet by mouth 2 (two) times daily. 01/04/19   Nyra Anspaugh B, FNP  traMADol (ULTRAM) 50 MG tablet Take 1 tablet (50 mg total) by mouth every 6 (six) hours as needed. 08/16/18   Cuthriell, Charline Bills, PA-C    Allergies Imitrex [sumatriptan] and Penicillins  Family History  Problem Relation Age of Onset  . Depression Mother   . CAD Father   .  Hypertension Father   . Lung cancer Father   . Breast cancer Maternal Grandmother     Social History Social History   Tobacco Use  . Smoking status: Current Every Day Smoker    Packs/day: 0.50    Types: Cigarettes  . Smokeless tobacco: Never Used  Substance Use Topics  . Alcohol use: No    Alcohol/week: 0.0 standard drinks  . Drug use: No    Comment: Marijuana    Review of Systems Constitutional: Negative for fever. Positive for decreased ability to hear from right ear(s). Eyes: Negative for discharge or drainage. ENT:       Positive for otalgia in right ear(s).      Negative  for rhinorrhea or congestion.      Negative for sore throat. Gastrointestinal: Negative for nausea, vomiting, or diarrhea. Musculoskeletal: Negative for myalgias. Skin: Negative for rash, lesions, or wounds. Neurological: Negative for paresthesias. ____________________________________________   PHYSICAL EXAM:  VITAL SIGNS: ED Triage Vitals  Enc Vitals Group     BP 07/07/19 1829 (!) 168/102     Pulse Rate 07/07/19 1829 100     Resp 07/07/19 1829 18     Temp 07/07/19 1829 98.2 F (36.8 C)     Temp Source 07/07/19 1829 Oral     SpO2 07/07/19 1829 96 %     Weight 07/07/19 1830 160 lb (72.6 kg)     Height 07/07/19 1830 5\' 7"  (1.702 m)     Head Circumference --      Peak Flow --      Pain Score 07/07/19 1830 7     Pain Loc --      Pain Edu? --      Excl. in [place]? --     Constitutional: Overall well appearing. Eyes: Conjunctivae are clear without discharge or drainage. Ears:       Right TM: erythematous and dull, TM intact.      Left TM: mildly erythematous, TM intact. Head: Atraumatic. Nose: No rhinorrhea or sinus pain on percussion. Mouth/Throat: Oropharynx normal. Tonsils normal without exudate. Hematological/Lymphatic/Immunilogical: No palpable anterior cervical lymphadenopathy. Cardiovascular: Heart rate and rhythm are regular without murmur, gallop, or rub appreciated. Respiratory: Breath sounds are clear throughout to auscultation.  Neurologic:  Alert and oriented x 4. Skin: Intact and without rash, lesion, or wound on exposed skin surfaces. ____________________________________________   LABS (all labs ordered are listed, but only abnormal results are displayed)  Labs Reviewed - No data to display ____________________________________________   RADIOLOGY  Not indicated ____________________________________________   PROCEDURES  Procedure(s) performed:   Procedures  ____________________________________________   INITIAL IMPRESSION / ASSESSMENT AND PLAN /  ED COURSE  53 year old female presenting to the emergency department for treatment and evaluation of otalgia that is been present for the past 4 days.  See HPI for further details.  Right tympanic membrane does appear erythematous and dull.  Left is mildly erythematous as well.  Patient will be treated with Keflex.  She states that she had a rash after taking amoxicillin several years ago.  She did not have any airway issues.  She is also requesting inexpensive medication as she does not currently have any insurance.  Patient was encouraged to follow-up with the primary care provider of her choice or return to the emergency department for symptoms of change or worsen if unable to schedule an appointment.  Pertinent labs & imaging results that were available during my care of the patient were reviewed by me and considered in  my medical decision making (see chart for details). ____________________________________________   FINAL CLINICAL IMPRESSION(S) / ED DIAGNOSES  Final diagnoses:  Non-recurrent acute suppurative otitis media of right ear without spontaneous rupture of tympanic membrane    ED Discharge Orders         Ordered    cephALEXin (KEFLEX) 500 MG capsule  3 times daily     07/07/19 2002          If controlled substance prescribed during this visit, 12 month history viewed on the [place] prior to issuing an initial prescription for Schedule II or III opiod.   Note:  This document was prepared using Dragon voice recognition software and may include unintentional dictation errors.    Victorino Dike, FNP 07/07/19 2130    Blake Divine, MD 07/07/19 2332

## 2019-07-26 ENCOUNTER — Encounter: Payer: Self-pay | Admitting: Emergency Medicine

## 2019-07-26 ENCOUNTER — Other Ambulatory Visit: Payer: Self-pay

## 2019-07-26 ENCOUNTER — Emergency Department
Admission: EM | Admit: 2019-07-26 | Discharge: 2019-07-26 | Disposition: A | Discharge status: Home / Self Care | Payer: Self-pay | Attending: Emergency Medicine | Admitting: Emergency Medicine

## 2019-07-26 DIAGNOSIS — F17210 Nicotine dependence, cigarettes, uncomplicated: Secondary | ICD-10-CM | POA: Insufficient documentation

## 2019-07-26 DIAGNOSIS — H9201 Otalgia, right ear: Secondary | ICD-10-CM | POA: Insufficient documentation

## 2019-07-26 DIAGNOSIS — I10 Essential (primary) hypertension: Secondary | ICD-10-CM | POA: Insufficient documentation

## 2019-07-26 DIAGNOSIS — Z853 Personal history of malignant neoplasm of breast: Secondary | ICD-10-CM | POA: Insufficient documentation

## 2019-07-26 DIAGNOSIS — Z79899 Other long term (current) drug therapy: Secondary | ICD-10-CM | POA: Insufficient documentation

## 2019-07-26 DIAGNOSIS — J0100 Acute maxillary sinusitis, unspecified: Secondary | ICD-10-CM | POA: Insufficient documentation

## 2019-07-26 MED ORDER — SULFAMETHOXAZOLE-TRIMETHOPRIM 800-160 MG PO TABS
1.0000 | ORAL_TABLET | Freq: Once | ORAL | Status: AC
  Administered 2019-07-26: 1 via ORAL
  Filled 2019-07-26: qty 1

## 2019-07-26 MED ORDER — TRAMADOL HCL 50 MG PO TABS
50.0000 mg | ORAL_TABLET | Freq: Once | ORAL | Status: DC
  Filled 2019-07-26: qty 1

## 2019-07-26 MED ORDER — PREDNISONE 20 MG PO TABS
60.0000 mg | ORAL_TABLET | Freq: Once | ORAL | Status: AC
  Administered 2019-07-26: 60 mg via ORAL
  Filled 2019-07-26: qty 3

## 2019-07-26 MED ORDER — PREDNISONE 10 MG (21) PO TBPK: ORAL_TABLET | ORAL | 0 refills | Status: DC

## 2019-07-26 MED ORDER — SULFAMETHOXAZOLE-TRIMETHOPRIM 800-160 MG PO TABS: 1.0000 | ORAL_TABLET | Freq: Two times a day (BID) | ORAL | 0 refills | Status: DC

## 2019-07-26 MED ORDER — TRAMADOL HCL 50 MG PO TABS: 50.0000 mg | ORAL_TABLET | Freq: Four times a day (QID) | ORAL | 0 refills | Status: DC | PRN

## 2019-07-26 NOTE — ED Notes (Addendum)
NAD noted at time of D/C. Pt denies questions or concerns. Pt ambulatory to the lobby at this time. This RN explained to patient that unfortunately patient could not have pain medication due to having driven herself. Discussed OTC options with patient at this time.

## 2019-07-26 NOTE — ED Provider Notes (Signed)
[facility] Emergency Department Provider Note ____________________________________________  Time seen: Approximately 8:48 PM  I have reviewed the triage vital signs and the nursing notes.   HISTORY  Chief Complaint Facial Pain and Otalgia    HPI Kelsey Russo is a 53 y.o. female who presents to the emergency department for treatment and evaluation of right side ear and facial pain. She was recently treated for a right ear infection. She finished the antibiotic and the pain had gone away but suddenly returned 3 days ago. No relief with OTC medications.    Past Medical History:  Diagnosis Date  . Breast cancer [facility])    Status post right breast lumpectomy and radiation  . Depression   . History of seizure disorder   . Hypertension     Patient Active Problem List   Diagnosis Date Noted  . Altered mental status 07/13/2015    Past Surgical History:  Procedure Laterality Date  . Brain tumor resection    . Right breast lumpectomy      Prior to Admission medications   Medication Sig Start Date End Date Taking? Authorizing Provider  albuterol (PROVENTIL HFA;VENTOLIN HFA) 108 (90 Base) MCG/ACT inhaler Inhale 2 puffs into the lungs every 6 (six) hours as needed for wheezing or shortness of breath. 06/26/17   Menshew, Dannielle Karvonen, PA-C  amLODipine (NORVASC) 5 MG tablet Take 1 tablet (5 mg total) by mouth daily. 01/04/19 03/05/19  Franklin Baumbach, Dessa Phi, FNP  benzonatate (TESSALON PERLES) 100 MG capsule Take 1 capsule (100 mg total) by mouth 3 (three) times daily as needed for cough (Take 1-2 per dose). 06/26/17   Menshew, Dannielle Karvonen, PA-C  cefdinir (OMNICEF) 300 MG capsule Take 1 capsule (300 mg total) by mouth 2 (two) times daily. 08/16/18   Cuthriell, Charline Bills, PA-C  cetirizine (ZYRTEC) 10 MG tablet Take 1 tablet (10 mg total) by mouth daily. 08/16/18   Cuthriell, Charline Bills, PA-C  cyclobenzaprine (FLEXERIL) 5 MG tablet Take 1-2 tablets 3 times daily as  needed 05/22/19   Laban Emperor, PA-C  fluticasone [facility]) 50 MCG/ACT nasal spray Place 1 spray into both nostrils 2 (two) times daily. 08/16/18   Cuthriell, Charline Bills, PA-C  ibuprofen (ADVIL,MOTRIN) 800 MG tablet Take 1 tablet (800 mg total) by mouth every 8 (eight) hours as needed. 05/12/18   Lisa Roca, MD  ketorolac (TORADOL) 10 MG tablet Take 1 tablet (10 mg total) by mouth every 6 (six) hours as needed. 05/22/19   Laban Emperor, PA-C  Melatonin 5 MG TABS Take 1 tablet by mouth at bedtime and may repeat dose one time if needed.    [provider]  predniSONE (STERAPRED UNI-PAK 21 TAB) 10 MG (21) TBPK tablet Take 6 tablets on the first day and decrease by 1 tablet each day until finished. 07/26/19   Sara Keys, Johnette Abraham B, FNP  sulfamethoxazole-trimethoprim (BACTRIM DS) 800-160 MG tablet Take 1 tablet by mouth 2 (two) times daily. 07/26/19   Peggyann Zwiefelhofer, Johnette Abraham B, FNP  traMADol (ULTRAM) 50 MG tablet Take 1 tablet (50 mg total) by mouth every 6 (six) hours as needed. 07/26/19   Sherrie George B, FNP    Allergies Imitrex [sumatriptan] and Penicillins  Family History  Problem Relation Age of Onset  . Depression Mother   . CAD Father   . Hypertension Father   . Lung cancer Father   . Breast cancer Maternal Grandmother     Social History Social History   Tobacco Use  .  Smoking status: Current Every Day Smoker    Packs/day: 0.50    Types: Cigarettes  . Smokeless tobacco: Never Used  Substance Use Topics  . Alcohol use: No    Alcohol/week: 0.0 standard drinks  . Drug use: No    Comment: Marijuana    Review of Systems Constitutional: Negative for fever. Positive for decreased ability to hear from right ear at baseline. Eyes: Negative for discharge or drainage. ENT:       Positive for otalgia in right ear(s).      Negative for rhinorrhea or congestion.      Negative for sore throat. Gastrointestinal: Negative for nausea, vomiting, or diarrhea. Musculoskeletal: Negative for  myalgias. Skin: Negative for rash, lesions, or wounds. Neurological: Negative for paresthesias. ____________________________________________   PHYSICAL EXAM:  VITAL SIGNS: ED Triage Vitals  Enc Vitals Group     BP 07/26/19 1406 (!) 139/93     Pulse Rate 07/26/19 1403 97     Resp 07/26/19 1403 16     Temp 07/26/19 1403 98.2 F (36.8 C)     Temp Source 07/26/19 1403 Oral     SpO2 07/26/19 1403 98 %     Weight 07/26/19 1404 165 lb (74.8 kg)     Height 07/26/19 1404 5\' 7"  (1.702 m)     Head Circumference --      Peak Flow --      Pain Score 07/26/19 1404 7     Pain Loc --      Pain Edu? --      Excl. in [place]? --     Constitutional: Overall well appearing. Eyes: Conjunctivae are clear without discharge or drainage. Ears:       Right TM: normal.      Left TM: normal. Head: Atraumatic. Nose: Right side sinus pain on percussion. Mouth/Throat: Oropharynx normal. Tonsils normal without exudate. Hematological/Lymphatic/Immunilogical: No palpable anterior cervical lymphadenopathy. Cardiovascular: Heart rate and rhythm are regular without murmur, gallop, or rub appreciated. Respiratory: Breath sounds are clear throughout to auscultation.  Neurologic:  Alert and oriented x 4. Skin: Intact and without rash, lesion, or wound on exposed skin surfaces. ____________________________________________   LABS (all labs ordered are listed, but only abnormal results are displayed)  Labs Reviewed - No data to display ____________________________________________   RADIOLOGY  Not indicated ____________________________________________   PROCEDURES  Procedure(s) performed:   Procedures  ____________________________________________   INITIAL IMPRESSION / ASSESSMENT AND PLAN / ED COURSE  53 year old female presents to the emergency department for treatment and evaluation of right ear pain and sinus pressure. No otitis media tonight. She is tender over the right maxillary sinus. She  will be treated with bactrim and prednisone. She is to follow up with primary care for symptoms that are not improving over the next few days. She is to return to the ER for symptoms that change or worsen if unable to schedule an appointment.  Pertinent labs & imaging results that were available during my care of the patient were reviewed by me and considered in my medical decision making (see chart for details). ____________________________________________   FINAL CLINICAL IMPRESSION(S) / ED DIAGNOSES  Final diagnoses:  Acute non-recurrent maxillary sinusitis    ED Discharge Orders         Ordered    sulfamethoxazole-trimethoprim (BACTRIM DS) 800-160 MG tablet  2 times daily     07/26/19 1733    traMADol (ULTRAM) 50 MG tablet  Every 6 hours PRN     07/26/19  1733    predniSONE (STERAPRED UNI-PAK 21 TAB) 10 MG (21) TBPK tablet     07/26/19 1733          If controlled substance prescribed during this visit, 12 month history viewed on the [place] prior to issuing an initial prescription for Schedule II or III opiod.   Note:  This document was prepared using Dragon voice recognition software and may include unintentional dictation errors.    Victorino Dike, FNP 07/26/19 2055    Duffy Bruce, MD 07/27/19 [phone]

## 2019-07-26 NOTE — Discharge Instructions (Signed)
Please follow up with primary care as soon as possible. You also need to call [facility] to ask about their sliding scale process. Return to the ER for symptoms that change or worsen if unable to schedule an appointment.

## 2019-07-26 NOTE — ED Triage Notes (Signed)
Pt to ED via POV c/o right sided facial pain and right ear pain x 3 days. Pt is in NAD.

## 2019-10-27 ENCOUNTER — Encounter: Payer: Self-pay | Admitting: Emergency Medicine

## 2019-10-27 ENCOUNTER — Emergency Department
Admission: EM | Admit: 2019-10-27 | Discharge: 2019-10-27 | Disposition: A | Discharge status: Home / Self Care | Payer: Medicaid Other | Attending: Emergency Medicine | Admitting: Emergency Medicine

## 2019-10-27 ENCOUNTER — Other Ambulatory Visit: Payer: Self-pay

## 2019-10-27 DIAGNOSIS — K0889 Other specified disorders of teeth and supporting structures: Secondary | ICD-10-CM | POA: Insufficient documentation

## 2019-10-27 DIAGNOSIS — Z79899 Other long term (current) drug therapy: Secondary | ICD-10-CM | POA: Insufficient documentation

## 2019-10-27 DIAGNOSIS — J0100 Acute maxillary sinusitis, unspecified: Secondary | ICD-10-CM

## 2019-10-27 DIAGNOSIS — N63 Unspecified lump in unspecified breast: Secondary | ICD-10-CM

## 2019-10-27 DIAGNOSIS — H9202 Otalgia, left ear: Secondary | ICD-10-CM | POA: Insufficient documentation

## 2019-10-27 DIAGNOSIS — I10 Essential (primary) hypertension: Secondary | ICD-10-CM | POA: Insufficient documentation

## 2019-10-27 DIAGNOSIS — Z853 Personal history of malignant neoplasm of breast: Secondary | ICD-10-CM | POA: Insufficient documentation

## 2019-10-27 DIAGNOSIS — N6310 Unspecified lump in the right breast, unspecified quadrant: Secondary | ICD-10-CM | POA: Insufficient documentation

## 2019-10-27 DIAGNOSIS — F17210 Nicotine dependence, cigarettes, uncomplicated: Secondary | ICD-10-CM | POA: Insufficient documentation

## 2019-10-27 MED ORDER — CEFDINIR 300 MG PO CAPS: 300.0000 mg | ORAL_CAPSULE | Freq: Two times a day (BID) | ORAL | 0 refills | Status: AC

## 2019-10-27 MED ORDER — TRAMADOL HCL 50 MG PO TABS
50.0000 mg | ORAL_TABLET | Freq: Once | ORAL | Status: AC
  Administered 2019-10-27: 50 mg via ORAL
  Filled 2019-10-27: qty 1

## 2019-10-27 MED ORDER — CETIRIZINE HCL 10 MG PO TABS: 10.0000 mg | ORAL_TABLET | Freq: Every day | ORAL | 0 refills | Status: DC

## 2019-10-27 MED ORDER — CEFDINIR 300 MG PO CAPS
300.0000 mg | ORAL_CAPSULE | Freq: Two times a day (BID) | ORAL | Status: DC
  Administered 2019-10-27: 300 mg via ORAL
  Filled 2019-10-27 (×2): qty 1

## 2019-10-27 MED ORDER — FLUTICASONE PROPIONATE 50 MCG/ACT NA SUSP: 2.0000 | Freq: Every day | NASAL | 0 refills | Status: DC

## 2019-10-27 NOTE — ED Triage Notes (Signed)
Patient ambulatory to triage with steady gait, without difficulty or distress noted; mask in place; pt with left ear/dental/facial pain since yesterday

## 2019-10-27 NOTE — ED Provider Notes (Signed)
[facility] Emergency Department Provider Note  ____________________________________________  Time seen: Approximately 9:02 PM  I have reviewed the triage vital signs and the nursing notes.   HISTORY  Chief Complaint Otalgia    HPI Kelsey Russo is a 54 y.o. female that presents to the emergency department for evaluation of left ear pain, left jaw pain, "dry" sinuses for a couple of days.  Patient denies any fever.  Patient states that she has had this similarly several times in the past.  No cough, shortness of breath.  Patient also states that she has had a lump to her right breast for 1 month.  She has a history of breast cancer and had a lumpectomy with radiation to her right breast back in 1999.  Patient states that she is not here to be evaluated for this but she is looking for advice on how to get a mammogram, given that she does not have any insurance.  Patient states that lump is not painful.  It has not changed in size over the last month.  Her nipple inverted about 1 month ago when she first noticed the lump.  Patient states that she has applied with [facility] but was rejected her first application.  She is in the process of a second application.  She had her first lumpectomy and radiation through them.   Past Medical History:  Diagnosis Date  . Breast cancer [facility])    Status post right breast lumpectomy and radiation  . Depression   . History of seizure disorder   . Hypertension     Patient Active Problem List   Diagnosis Date Noted  . Altered mental status 07/13/2015    Past Surgical History:  Procedure Laterality Date  . Brain tumor resection    . Right breast lumpectomy      Prior to Admission medications   Medication Sig Start Date End Date Taking? Authorizing Provider  albuterol (PROVENTIL HFA;VENTOLIN HFA) 108 (90 Base) MCG/ACT inhaler Inhale 2 puffs into the lungs every 6 (six) hours as needed for wheezing or shortness of  breath. 06/26/17   Menshew, Dannielle Karvonen, PA-C  amLODipine (NORVASC) 5 MG tablet Take 1 tablet (5 mg total) by mouth daily. 01/04/19 03/05/19  Triplett, Dessa Phi, FNP  benzonatate (TESSALON PERLES) 100 MG capsule Take 1 capsule (100 mg total) by mouth 3 (three) times daily as needed for cough (Take 1-2 per dose). 06/26/17   Menshew, Dannielle Karvonen, PA-C  cefdinir (OMNICEF) 300 MG capsule Take 1 capsule (300 mg total) by mouth 2 (two) times daily for 10 days. 10/27/19 11/06/19  Laban Emperor, PA-C  cetirizine (ZYRTEC ALLERGY) 10 MG tablet Take 1 tablet (10 mg total) by mouth daily. 10/27/19 10/26/20  Laban Emperor, PA-C  cyclobenzaprine (FLEXERIL) 5 MG tablet Take 1-2 tablets 3 times daily as needed 05/22/19   Laban Emperor, PA-C  fluticasone [facility]) 50 MCG/ACT nasal spray Place 2 sprays into both nostrils daily. 10/27/19 10/26/20  Laban Emperor, PA-C  ibuprofen (ADVIL,MOTRIN) 800 MG tablet Take 1 tablet (800 mg total) by mouth every 8 (eight) hours as needed. 05/12/18   Lisa Roca, MD  ketorolac (TORADOL) 10 MG tablet Take 1 tablet (10 mg total) by mouth every 6 (six) hours as needed. 05/22/19   Laban Emperor, PA-C  Melatonin 5 MG TABS Take 1 tablet by mouth at bedtime and may repeat dose one time if needed.    [provider]  predniSONE (STERAPRED UNI-PAK 21 TAB)  10 MG (21) TBPK tablet Take 6 tablets on the first day and decrease by 1 tablet each day until finished. 07/26/19   Triplett, Johnette Abraham B, FNP  sulfamethoxazole-trimethoprim (BACTRIM DS) 800-160 MG tablet Take 1 tablet by mouth 2 (two) times daily. 07/26/19   Triplett, Johnette Abraham B, FNP  traMADol (ULTRAM) 50 MG tablet Take 1 tablet (50 mg total) by mouth every 6 (six) hours as needed. 07/26/19   Sherrie George B, FNP    Allergies Imitrex [sumatriptan] and Penicillins  Family History  Problem Relation Age of Onset  . Depression Mother   . CAD Father   . Hypertension Father   . Lung cancer Father   . Breast cancer Maternal Grandmother      Social History Social History   Tobacco Use  . Smoking status: Current Every Day Smoker    Packs/day: 0.50    Types: Cigarettes  . Smokeless tobacco: Never Used  Substance Use Topics  . Alcohol use: No    Alcohol/week: 0.0 standard drinks  . Drug use: No    Comment: Marijuana     Review of Systems  Constitutional: No fever/chills ENT: Positive for nasal congestion. Cardiovascular: No chest pain. Respiratory: No SOB. Gastrointestinal: No nausea, no vomiting.  Musculoskeletal: Negative for musculoskeletal pain. Skin: Negative for rash, abrasions, lacerations, ecchymosis. Neurological: Negative for headaches   ____________________________________________   PHYSICAL EXAM:  VITAL SIGNS: ED Triage Vitals  Enc Vitals Group     BP 10/27/19 2050 (!) 150/80     Pulse Rate 10/27/19 2050 (!) 105     Resp 10/27/19 2050 18     Temp 10/27/19 2050 98.2 F (36.8 C)     Temp Source 10/27/19 2050 Oral     SpO2 10/27/19 2050 100 %     Weight 10/27/19 2048 165 lb (74.8 kg)     Height 10/27/19 2048 5\' 7"  (1.702 m)     Head Circumference --      Peak Flow --      Pain Score 10/27/19 2048 9     Pain Loc --      Pain Edu? --      Excl. in [place]? --      Constitutional: Alert and oriented. Well appearing and in no acute distress. Eyes: Conjunctivae are normal. PERRL. EOMI. Head: Atraumatic. ENT:      Ears: Tympanic membrane is pink.      Nose: No congestion/rhinnorhea.  Left maxillary sinus is tender to palpation.      Mouth/Throat: Mucous membranes are moist.  Poor dentition. Neck: No stridor. Cardiovascular: Normal rate, regular rhythm.  Good peripheral circulation. Respiratory: Normal respiratory effort without tachypnea or retractions. Lungs CTAB. Good air entry to the bases with no decreased or absent breath sounds. Breast: Firm lump to right breast under right areaola. Inverted nipple. No discharge. No tenderness. No erythema. Gastrointestinal: Bowel sounds 4  quadrants. Soft and nontender to palpation. No guarding or rigidity. No palpable masses. No distention.  Musculoskeletal: Full range of motion to all extremities. No gross deformities appreciated. Neurologic:  Normal speech and language. No gross focal neurologic deficits are appreciated.  Skin:  Skin is warm, dry and intact. No rash noted. Psychiatric: Mood and affect are normal. Speech and behavior are normal. Patient exhibits appropriate insight and judgement.   ____________________________________________   LABS (all labs ordered are listed, but only abnormal results are displayed)  Labs Reviewed - No data to display ____________________________________________  EKG   ____________________________________________  RADIOLOGY  No  results found.  ____________________________________________    PROCEDURES  Procedure(s) performed:    Procedures    Medications  cefdinir (OMNICEF) capsule 300 mg (300 mg Oral Given 10/27/19 2227)  traMADol (ULTRAM) tablet 50 mg (50 mg Oral Given 10/27/19 2227)     ____________________________________________   INITIAL IMPRESSION / ASSESSMENT AND PLAN / ED COURSE  Pertinent labs & imaging results that were available during my care of the patient were reviewed by me and considered in my medical decision making (see chart for details).  Review of the [place] CSRS was performed in accordance of the [place] prior to dispensing any controlled drugs.   Patient presents emergency department for evaluation of left ear pain, left sinus pain, left jaw pain.  Vital signs and exam are reassuring.  Patient will be covered for sinus and dental infection with cefdinir.  She has had this antibiotic before in the past.  Patient also brings up prior to discharge that she has had a new right breast lump for 1 month.  Based on her history and her exam, this is concerning for breast cancer.  Patient was given resources for the [facility] breast center. She is also in  the process of applying for charity care through [facility], who she received her previous mammogram and breast cancer treatment through. Messages were sent to case management to aim to get patient assistance with obtaining a mammogram.  Patient understands fully the importance of this.   Patient will be discharged home with prescriptions for cefdinir, Zyrtec, Flonase. Patient is to follow up with primary care as directed. Patient is given ED precautions to return to the ED for any worsening or new symptoms.   Kelsey Russo was evaluated in Emergency Department on 10/27/2019 for the symptoms described in the history of present illness. She was evaluated in the context of the global COVID-19 pandemic, which necessitated consideration that the patient might be at risk for infection with the SARS-CoV-2 virus that causes COVID-19. Institutional protocols and algorithms that pertain to the evaluation of patients at risk for COVID-19 are in a state of rapid change based on information released by regulatory bodies including the CDC and federal and state organizations. These policies and algorithms were followed during the patient's care in the ED.  ____________________________________________  FINAL CLINICAL IMPRESSION(S) / ED DIAGNOSES  Final diagnoses:  Breast lump  Left ear pain  Pain, dental  Acute non-recurrent maxillary sinusitis      NEW MEDICATIONS STARTED DURING THIS VISIT:  ED Discharge Orders         Ordered    cefdinir (OMNICEF) 300 MG capsule  2 times daily     10/27/19 2208    cetirizine (ZYRTEC ALLERGY) 10 MG tablet  Daily     10/27/19 2208    fluticasone (FLONASE) 50 MCG/ACT nasal spray  Daily     10/27/19 2208              This chart was dictated using voice recognition software/Dragon. Despite best efforts to proofread, errors can occur which can change the meaning. Any change was purely unintentional.    Laban Emperor, PA-C 10/27/19 Pisinemo, Kevin,  MD 10/27/19 2325

## 2019-10-27 NOTE — Discharge Instructions (Addendum)
I am concerned that your breast lump may be cancer.  Please call the [facility] breast center tomorrow to see about scheduling you an appointment.  I have placed some consults to try and get you some assistance in scheduling the mammogram as well.

## 2019-10-28 ENCOUNTER — Telehealth: Payer: Self-pay | Admitting: *Deleted

## 2019-11-03 ENCOUNTER — Ambulatory Visit
Admission: RE | Admit: 2019-11-03 | Discharge: 2019-11-03 | Disposition: A | Discharge status: Home / Self Care | Payer: Medicaid Other | Source: Ambulatory Visit | Attending: Oncology | Admitting: Oncology

## 2019-11-03 ENCOUNTER — Other Ambulatory Visit: Payer: Self-pay

## 2019-11-03 ENCOUNTER — Ambulatory Visit: Payer: Medicaid Other | Attending: Oncology

## 2019-11-03 VITALS — BP 131/86 | HR 89 | Temp 98.6°F | Resp 18 | Ht 63.0 in | Wt 165.8 lb

## 2019-11-03 DIAGNOSIS — Z0000 Encounter for general adult medical examination without abnormal findings: Secondary | ICD-10-CM | POA: Insufficient documentation

## 2019-11-03 DIAGNOSIS — N63 Unspecified lump in unspecified breast: Secondary | ICD-10-CM

## 2019-11-03 NOTE — Progress Notes (Signed)
  Subjective:     Patient ID: Kelsey Russo, female   DOB: 1966-04-04, 54 y.o.   MRN: [id number]  HPI   Review of Systems     Objective:   Physical Exam Chest:     Breasts:        Right: Inverted nipple and mass present. No swelling, bleeding, nipple discharge, skin change or tenderness.        Left: No swelling, bleeding, inverted nipple, mass, nipple discharge, skin change or tenderness.       Comments: Right retroareolar mass > 2 cm. Right nipple inversion; Lumpectomy scar right breast Genitourinary:    Labia:        Right: No rash, tenderness, lesion or injury.        Left: No rash, tenderness, lesion or injury.      Vagina: No signs of injury and foreign body. No vaginal discharge, erythema, tenderness, bleeding, lesions or prolapsed vaginal walls.     Cervix: Discharge present. No cervical motion tenderness, friability, lesion, erythema, cervical bleeding or eversion.     Uterus: Not deviated, not enlarged, not fixed, not tender and no uterine prolapse.      Adnexa:        Right: No mass, tenderness or fullness.         Left: No mass, tenderness or fullness.       Comments: Thin white discharge;        Assessment:     54 year old patient presents for [facility] clinic visit.  Patient seen in ED last week with complaint of right breast lump x 2 months.  She has history of right breast cancer in 1999. Treated with surgery, and radiation at [facility]. Patient screened, and meets BCCCP eligibility.  Patient does not have insurance, Medicare or Medicaid. Instructed patient on breast self awareness using teach back method.  Palpated a >2cm 11 o'clock, and retroareolar right breast.  Patient states mass has grown, and nipple inversion followed.  Pelvic exam normal.  Last pap > 10 years ago.   Risk Assessment    Risk Scores      11/03/2019   Last edited by: Theodore Demark, RN   5-year risk: 2.7 %   Lifetime risk: 17.6 %             Plan:     Sent for bilateral diagnostic  mammogram, and ultrasound.  Specimen collected for pap.

## 2019-11-05 LAB — IGP, APTIMA HPV: HPV Aptima: NEGATIVE

## 2019-11-10 ENCOUNTER — Ambulatory Visit
Admission: RE | Admit: 2019-11-10 | Discharge: 2019-11-10 | Disposition: A | Discharge status: Home / Self Care | Payer: Medicaid Other | Source: Ambulatory Visit | Attending: Oncology | Admitting: Oncology

## 2019-11-10 DIAGNOSIS — N63 Unspecified lump in unspecified breast: Secondary | ICD-10-CM | POA: Insufficient documentation

## 2019-11-10 HISTORY — PX: BREAST BIOPSY: SHX20

## 2019-11-12 ENCOUNTER — Other Ambulatory Visit: Payer: Self-pay | Admitting: Oncology

## 2019-11-13 ENCOUNTER — Other Ambulatory Visit: Payer: Self-pay

## 2019-11-13 DIAGNOSIS — C50919 Malignant neoplasm of unspecified site of unspecified female breast: Secondary | ICD-10-CM

## 2019-11-13 NOTE — Progress Notes (Signed)
Confirmation of patient biopsy result notification received from [facility].  Met patient originally in [facility] on 11/03/19.  Phoned her to discuss plan for Surgical/Med/Onc consults, and BCCCP Medicaid application.  Scheduled for both consults on 11/19/19.  BCCM application completed and scanned to [facility]. Will accompany patient to Med?onc consult.

## 2019-11-16 NOTE — Progress Notes (Signed)
Patient's BCCCP medicaid is approved.  She has consults with Dr. Bary Castilla, and Dr. Grayland Ormond on 11/19/19 for invasive mammary carcinoma diagnosis.  Reviewed negative pap/HPV results with patient on 11/13/19.  Copy to [place].

## 2019-11-19 ENCOUNTER — Other Ambulatory Visit: Payer: Self-pay

## 2019-11-19 ENCOUNTER — Inpatient Hospital Stay: Payer: Medicaid Other | Attending: Oncology | Admitting: Oncology

## 2019-11-19 ENCOUNTER — Other Ambulatory Visit: Payer: Self-pay | Admitting: General Surgery

## 2019-11-19 ENCOUNTER — Encounter: Payer: Self-pay | Admitting: Emergency Medicine

## 2019-11-19 VITALS — BP 162/96 | HR 92 | Temp 96.4°F | Wt 166.6 lb

## 2019-11-19 DIAGNOSIS — Z923 Personal history of irradiation: Secondary | ICD-10-CM

## 2019-11-19 DIAGNOSIS — Z803 Family history of malignant neoplasm of breast: Secondary | ICD-10-CM | POA: Insufficient documentation

## 2019-11-19 DIAGNOSIS — F17210 Nicotine dependence, cigarettes, uncomplicated: Secondary | ICD-10-CM | POA: Insufficient documentation

## 2019-11-19 DIAGNOSIS — Z5111 Encounter for antineoplastic chemotherapy: Secondary | ICD-10-CM | POA: Insufficient documentation

## 2019-11-19 DIAGNOSIS — Z801 Family history of malignant neoplasm of trachea, bronchus and lung: Secondary | ICD-10-CM | POA: Diagnosis not present

## 2019-11-19 DIAGNOSIS — Z170 Estrogen receptor positive status [ER+]: Secondary | ICD-10-CM | POA: Diagnosis not present

## 2019-11-19 DIAGNOSIS — C50411 Malignant neoplasm of upper-outer quadrant of right female breast: Secondary | ICD-10-CM | POA: Diagnosis present

## 2019-11-19 DIAGNOSIS — Z86000 Personal history of in-situ neoplasm of breast: Secondary | ICD-10-CM | POA: Insufficient documentation

## 2019-11-19 DIAGNOSIS — C50919 Malignant neoplasm of unspecified site of unspecified female breast: Secondary | ICD-10-CM

## 2019-11-19 NOTE — Progress Notes (Signed)
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  INTERVAL HISTORY: Patient is a 54 year old female with past medical history significant for DCIS of the right breast approximately 20 years ago.  She underwent lumpectomy and XRT, but did not receive adjuvant tamoxifen at that time.  She was recently noted to have a right breast mass and subsequent imaging and biopsy revealed the above-stated breast cancer.  She is anxious, but otherwise feels well.  She has no neurologic complaints.  She denies any recent fevers or illnesses.  She has a good appetite and denies weight loss.  She has no chest pain, shortness of breath, cough, or hemoptysis.  She denies any nausea, vomiting, constipation, or diarrhea.  She has no urinary complaints.  Patient otherwise feels well and offers no further specific complaints today.  REVIEW OF SYSTEMS:   Review of Systems  Constitutional: Negative.  Negative for fever, malaise/fatigue and weight loss.  Respiratory: Negative.  Negative for cough, hemoptysis and shortness of breath.   Cardiovascular: Negative.  Negative for chest pain and leg swelling.  Gastrointestinal: Negative.  Negative for abdominal pain.  Genitourinary: Negative.  Negative for dysuria.  Musculoskeletal: Negative.  Negative for back pain.  Skin: Negative.  Negative for rash.  Neurological: Negative.  Negative for dizziness, focal weakness, weakness and headaches.    As per HPI. Otherwise, a complete review of systems is negative.  PAST MEDICAL HISTORY: Past Medical History:  Diagnosis Date  . Breast  cancer ([place]) 1999   Status post right breast lumpectomy and radiation  . Depression   . History of seizure disorder   . Hypertension   . Personal history of radiation therapy 1999    PAST SURGICAL HISTORY: Past Surgical History:  Procedure Laterality Date  . Brain tumor resection    . BREAST BIOPSY Right 1999   Positive  . BREAST BIOPSY Right 11/10/2019   [place] bx 10:00 heart marker, path pending  . BREAST BIOPSY Right 11/10/2019   [place] bx axilla, hydromark 4, path pending  . BREAST BIOPSY Left 11/10/2019   [place] bx 2:00, venus marker, path pending  . Right breast lumpectomy      FAMILY HISTORY: Family History  Problem Relation Age of Onset  . Depression Mother   . CAD Father   . Hypertension Father   . Lung cancer Father   . Breast cancer Maternal Grandmother     ADVANCED DIRECTIVES (Y/N):  N  HEALTH MAINTENANCE: Social History   Tobacco Use  . Smoking status: Current Every Day Smoker    Packs/day: 0.50    Types: Cigarettes  . Smokeless tobacco: Never Used  Substance Use Topics  . Alcohol use: No    Alcohol/week: 0.0 standard drinks  . Drug use: No    Comment: Marijuana     Colonoscopy:  PAP:  Bone density:  Lipid panel:  Allergies  Allergen Reactions  . Imitrex [Sumatriptan] Anaphylaxis  . Penicillins Rash    Current Outpatient Medications  Medication Sig Dispense Refill  . amLODipine (NORVASC) 10 MG tablet Take 1 tablet by mouth daily.    . Melatonin 5 MG TABS Take 1 tablet by  mouth at bedtime and may repeat dose one time if needed.    . Pseudoeph-Doxylamine-DM-APAP (NYQUIL PO) Take by mouth at bedtime as needed.     No current facility-administered medications for this visit.    OBJECTIVE: Vitals:   11/19/19 1126  BP: (!) 162/96  Pulse: 92  Temp: (!) 96.4 F (35.8 C)  SpO2: 100%     Body mass index is 29.51 kg/m.    ECOG FS:0 - Asymptomatic  General: Well-developed, well-nourished, no acute distress. Eyes: Pink conjunctiva, anicteric  sclera. HEENT: Normocephalic, moist mucous membranes. Breast: Easily palpable right breast mass. Lungs: No audible wheezing or coughing. Heart: Regular rate and rhythm. Abdomen: Soft, nontender, no obvious distention. Musculoskeletal: No edema, cyanosis, or clubbing. Neuro: Alert, answering all questions appropriately. Cranial nerves grossly intact. Skin: No rashes or petechiae noted. Psych: Normal affect. Lymphatics: No cervical, calvicular, axillary or inguinal LAD.   LAB RESULTS:  Lab Results  Component Value Date   NA 139 05/12/2018   K 3.8 05/12/2018   CL 106 05/12/2018   CO2 25 05/12/2018   GLUCOSE 93 05/12/2018   BUN 11 05/12/2018   CREATININE 0.62 05/12/2018   CALCIUM 9.4 05/12/2018   PROT 7.4 07/13/2015   ALBUMIN 4.2 07/13/2015   AST 19 07/13/2015   ALT 17 07/13/2015   ALKPHOS 101 07/13/2015   BILITOT 0.2 (L) 07/13/2015   GFRNONAA >60 05/12/2018   GFRAA >60 05/12/2018    Lab Results  Component Value Date   WBC 7.7 05/12/2018   HGB 15.8 05/12/2018   HCT 46.1 05/12/2018   MCV 93.9 05/12/2018   PLT 204 05/12/2018     STUDIES: US BREAST LTD UNI LEFT INC AXILLA  Result Date: 11/03/2019 CLINICAL DATA:  54 year old female presenting for evaluation of a palpable lump in the right breast at 11 o'clock identified on self breast exam. The patient has personal history of breast cancer in 1999 status post radiation and chemotherapy. She has family history of breast cancer in a maternal grandmother. EXAM: DIGITAL DIAGNOSTIC BILATERAL MAMMOGRAM WITH CAD AND TOMO ULTRASOUND BILATERAL BREAST COMPARISON:  None. ACR Breast Density Category c: The breast tissue is heterogeneously dense, which may obscure small masses. FINDINGS: A BB indicating the palpable site of concern has been placed on the upper outer anterior right breast. Deep to this marker, there is an obscured irregular mass measuring at least 2.5-3.0 cm. In the upper outer middle depth of the left breast, there is a  small obscured mass measuring approximately 6 mm. No other suspicious calcifications, masses or areas of distortion are seen in the bilateral breasts. Mammographic images were processed with CAD. Ultrasound of the palpable site in the right breast at 10 o'clock, 2 cm from the nipple demonstrates a hypoechoic irregular mass measuring 2.6 x 1.0 x 2.3 cm. Ultrasound of the right axilla demonstrates 2 abnormal appearing lymph nodes with cortices measuring up to 8 mm. Ultrasound of the left breast at 2 o'clock, 3 cm from the nipple demonstrates a circumscribed oval hypoechoic mass measuring 0.7 x 0.3 x 0.6 cm. Ultrasound of the left axilla demonstrates multiple normal-appearing lymph nodes. IMPRESSION: 1.  There is a suspicious mass in the right breast at 10 o'clock. 2.  There are 2 abnormally thickened right axillary lymph nodes. 3.  There is an indeterminate mass in the left breast at 2 o'clock. 4.  No evidence of left axillary lymphadenopathy. RECOMMENDATION: Ultrasound guided biopsy is recommended for the right breast mass, right axillary lymph node and the  left breast mass. I have discussed the findings and recommendations with the patient. If applicable, a reminder letter will be sent to the patient regarding the next appointment. BI-RADS CATEGORY  5: Highly suggestive of malignancy. Electronically Signed   By: Ammie Ferrier M.D.   On: 11/03/2019 10:53   US BREAST LTD UNI RIGHT INC AXILLA  Result Date: 11/03/2019 CLINICAL DATA:  54 year old female presenting for evaluation of a palpable lump in the right breast at 11 o'clock identified on self breast exam. The patient has personal history of breast cancer in 1999 status post radiation and chemotherapy. She has family history of breast cancer in a maternal grandmother. EXAM: DIGITAL DIAGNOSTIC BILATERAL MAMMOGRAM WITH CAD AND TOMO ULTRASOUND BILATERAL BREAST COMPARISON:  None. ACR Breast Density Category c: The breast tissue is heterogeneously dense, which  may obscure small masses. FINDINGS: A BB indicating the palpable site of concern has been placed on the upper outer anterior right breast. Deep to this marker, there is an obscured irregular mass measuring at least 2.5-3.0 cm. In the upper outer middle depth of the left breast, there is a small obscured mass measuring approximately 6 mm. No other suspicious calcifications, masses or areas of distortion are seen in the bilateral breasts. Mammographic images were processed with CAD. Ultrasound of the palpable site in the right breast at 10 o'clock, 2 cm from the nipple demonstrates a hypoechoic irregular mass measuring 2.6 x 1.0 x 2.3 cm. Ultrasound of the right axilla demonstrates 2 abnormal appearing lymph nodes with cortices measuring up to 8 mm. Ultrasound of the left breast at 2 o'clock, 3 cm from the nipple demonstrates a circumscribed oval hypoechoic mass measuring 0.7 x 0.3 x 0.6 cm. Ultrasound of the left axilla demonstrates multiple normal-appearing lymph nodes. IMPRESSION: 1.  There is a suspicious mass in the right breast at 10 o'clock. 2.  There are 2 abnormally thickened right axillary lymph nodes. 3.  There is an indeterminate mass in the left breast at 2 o'clock. 4.  No evidence of left axillary lymphadenopathy. RECOMMENDATION: Ultrasound guided biopsy is recommended for the right breast mass, right axillary lymph node and the left breast mass. I have discussed the findings and recommendations with the patient. If applicable, a reminder letter will be sent to the patient regarding the next appointment. BI-RADS CATEGORY  5: Highly suggestive of malignancy. Electronically Signed   By: Ammie Ferrier M.D.   On: 11/03/2019 10:53   MS DIGITAL DIAG TOMO BILAT  Result Date: 11/03/2019 CLINICAL DATA:  54 year old female presenting for evaluation of a palpable lump in the right breast at 11 o'clock identified on self breast exam. The patient has personal history of breast cancer in 1999 status post  radiation and chemotherapy. She has family history of breast cancer in a maternal grandmother. EXAM: DIGITAL DIAGNOSTIC BILATERAL MAMMOGRAM WITH CAD AND TOMO ULTRASOUND BILATERAL BREAST COMPARISON:  None. ACR Breast Density Category c: The breast tissue is heterogeneously dense, which may obscure small masses. FINDINGS: A BB indicating the palpable site of concern has been placed on the upper outer anterior right breast. Deep to this marker, there is an obscured irregular mass measuring at least 2.5-3.0 cm. In the upper outer middle depth of the left breast, there is a small obscured mass measuring approximately 6 mm. No other suspicious calcifications, masses or areas of distortion are seen in the bilateral breasts. Mammographic images were processed with CAD. Ultrasound of the palpable site in the right breast at 10 o'clock, 2 cm  from the nipple demonstrates a hypoechoic irregular mass measuring 2.6 x 1.0 x 2.3 cm. Ultrasound of the right axilla demonstrates 2 abnormal appearing lymph nodes with cortices measuring up to 8 mm. Ultrasound of the left breast at 2 o'clock, 3 cm from the nipple demonstrates a circumscribed oval hypoechoic mass measuring 0.7 x 0.3 x 0.6 cm. Ultrasound of the left axilla demonstrates multiple normal-appearing lymph nodes. IMPRESSION: 1.  There is a suspicious mass in the right breast at 10 o'clock. 2.  There are 2 abnormally thickened right axillary lymph nodes. 3.  There is an indeterminate mass in the left breast at 2 o'clock. 4.  No evidence of left axillary lymphadenopathy. RECOMMENDATION: Ultrasound guided biopsy is recommended for the right breast mass, right axillary lymph node and the left breast mass. I have discussed the findings and recommendations with the patient. If applicable, a reminder letter will be sent to the patient regarding the next appointment. BI-RADS CATEGORY  5: Highly suggestive of malignancy. Electronically Signed   By: Ammie Ferrier M.D.   On: 11/03/2019  10:53   MS CLIP PLACEMENT LEFT  Result Date: 11/10/2019 CLINICAL DATA:  Post ultrasound-guided biopsy of a mass in the right breast at the 10 o'clock position, ultrasound-guided biopsy of an abnormal lymph node in the right axilla and ultrasound-guided biopsy of a mass in the left breast at the 2 o'clock position. EXAM: DIAGNOSTIC BILATERAL MAMMOGRAM POST ULTRASOUND BIOPSY COMPARISON:  Previous exam(s). FINDINGS: Mammographic images were obtained following ultrasound-guided biopsy of a mass in the right breast at the 10 o'clock position, ultrasound-guided biopsy of an abnormal lymph node in the right axilla and ultrasound-guided biopsy of a mass in the left breast at the 2 o'clock position. Heart shaped biopsy marking clip is present at the site of the biopsied mass in the right breast at the 10 o'clock position. The biopsied lymph node in the right axilla could not be included in the field of view. A venous shaped biopsy marking clip is present at the site of the biopsied mass in the left breast at the 2 o'clock position. IMPRESSION: 1. Heart shaped biopsy marking clip at site of biopsied mass in the right breast at the 10 o'clock position. 2. Biopsied lymph node could not be included in the field of view on the post biopsy mammogram. 3. Venus shaped biopsy marking clip at site of biopsied mass in the left breast at the 2 o'clock position. Final Assessment: Post Procedure Mammograms for Marker Placement Electronically Signed   By: Everlean Alstrom M.D.   On: 11/10/2019 09:40   MS CLIP PLACEMENT RIGHT  Result Date: 11/10/2019 CLINICAL DATA:  Post ultrasound-guided biopsy of a mass in the right breast at the 10 o'clock position, ultrasound-guided biopsy of an abnormal lymph node in the right axilla and ultrasound-guided biopsy of a mass in the left breast at the 2 o'clock position. EXAM: DIAGNOSTIC BILATERAL MAMMOGRAM POST ULTRASOUND BIOPSY COMPARISON:  Previous exam(s). FINDINGS: Mammographic images were  obtained following ultrasound-guided biopsy of a mass in the right breast at the 10 o'clock position, ultrasound-guided biopsy of an abnormal lymph node in the right axilla and ultrasound-guided biopsy of a mass in the left breast at the 2 o'clock position. Heart shaped biopsy marking clip is present at the site of the biopsied mass in the right breast at the 10 o'clock position. The biopsied lymph node in the right axilla could not be included in the field of view. A venous shaped biopsy marking clip is  present at the site of the biopsied mass in the left breast at the 2 o'clock position. IMPRESSION: 1. Heart shaped biopsy marking clip at site of biopsied mass in the right breast at the 10 o'clock position. 2. Biopsied lymph node could not be included in the field of view on the post biopsy mammogram. 3. Venus shaped biopsy marking clip at site of biopsied mass in the left breast at the 2 o'clock position. Final Assessment: Post Procedure Mammograms for Marker Placement Electronically Signed   By: Everlean Alstrom M.D.   On: 11/10/2019 09:40   [place] LT BREAST BX W LOC DEV 1ST LESION IMG BX SPEC US GUIDE  Addendum Date: 11/17/2019   ADDENDUM REPORT: 11/13/2019 08:48 ADDENDUM: Pathology revealed GRADE III INVASIVE MAMMARY CARCINOMA of the RIGHT breast, 10:00 2cmfn. This was found to be concordant by Dr. Everlean Alstrom. 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 This was found to be concordant by Dr. Everlean Alstrom. Pathology revealed FIBROADENOMA of the LEFT breast, 2:00 3cmfn. This was found to be concordant by Dr. Everlean Alstrom. Pathology results were discussed with the patient by telephone. The patient reported doing well after the biopsies with tenderness at the sites. Post biopsy instructions and care were reviewed and questions were answered. The patient was encouraged to call [facility] for any additional  concerns. Al Pimple RN Nurse Navigator and BCCCP Coordinator has arranged appointment with Dr. Delight Hoh of [facility]. Pathology results reported by Stacie Acres RN on 11/13/2019. Electronically Signed   By: Everlean Alstrom M.D.   On: 11/13/2019 08:48   Result Date: 11/17/2019 CLINICAL DATA:  54 year old female with a suspicious mass in the right breast at the 10 o'clock position. Abnormal lymph node in the right axilla, and indeterminate mass in the left breast at the 2 o'clock position. EXAM: ULTRASOUND GUIDED RIGHT BREAST AND RIGHT AXILLA CORE NEEDLE BIOPSY ULTRASOUND GUIDED LEFT BREAST CORE NEEDLE BIOPSY COMPARISON:  Previous exam(s). PROCEDURE: I met with the patient and we discussed the procedure of ultrasound-guided biopsy, including benefits and alternatives. We discussed the high likelihood of a successful procedure. We discussed the risks of the procedure, including infection, bleeding, tissue injury, clip migration, and inadequate sampling. Informed written consent was given. The usual time-out protocol was performed immediately prior to the procedure. RIGHT BREAST 10 O'CLOCK: MASS: Lesion quadrant: UPPER-OUTER Using sterile technique and 1% Lidocaine as local anesthetic, under direct ultrasound visualization, a 14 gauge spring-loaded device was used to perform biopsy of the mass in the right breast at the 10 o'clock position using a lateral to medial approach. At the conclusion of the procedure a heart shaped tissue marker clip was deployed into the biopsy cavity. Follow up 2 view mammogram was performed and dictated separately. RIGHT AXILLA: LYMPH NODE: Lesion quadrant: UPPER-OUTER Using sterile technique and 1% Lidocaine as local anesthetic, under direct ultrasound visualization, a 14 gauge spring-loaded device was used to perform biopsy of the morphologically abnormal lymph node in the right axilla using a inferior to superior approach. At the conclusion of the  procedure a HydroMARK tissue marker clip was deployed into the biopsy cavity. Follow up 2 view mammogram was performed and dictated separately. LEFT BREAST 2 O'CLOCK: MASS: Lesion quadrant: UPPER-OUTER Using sterile technique and 1% Lidocaine as local anesthetic, under direct ultrasound visualization, a 14 gauge spring-loaded device was used to perform biopsy of the mass in the left breast at the  2 o'clock position using a lateral to medial approach. At the conclusion of the procedure a venous shaped tissue marker clip was deployed into the biopsy cavity. Follow up 2 view mammogram was performed and dictated separately. IMPRESSION: 1. Ultrasound-guided biopsy of the mass in the right breast at the 10 o'clock position. 2.  Ultrasound-guided biopsy of the lymph node in the right axilla. 3. Ultrasound-guided biopsy of the mass in the left breast at the 2 o'clock position. Electronically Signed: By: Everlean Alstrom M.D. 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 This was found to be concordant by Dr. Everlean Alstrom. 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 This was found to be concordant by Dr. Everlean Alstrom. Pathology revealed FIBROADENOMA of the LEFT breast, 2:00 3cmfn. This was found to be concordant by Dr. Everlean Alstrom. Pathology results were discussed with the patient by telephone. The patient reported doing well after the biopsies with tenderness at the sites. Post biopsy instructions and care were reviewed and questions were answered. The patient was encouraged to call [facility] for any additional concerns. Al Pimple RN Nurse Navigator and BCCCP Coordinator has arranged appointment with Dr. Delight Hoh  of [facility]. Pathology results reported by Stacie Acres RN on 11/13/2019. Electronically Signed   By: Everlean Alstrom M.D.   On: 11/13/2019 08:48   Result Date: 11/17/2019 CLINICAL DATA:  54 year old female with a suspicious mass in the right breast at the 10 o'clock position. Abnormal lymph node in the right axilla, and indeterminate mass in the left breast at the 2 o'clock position. EXAM: ULTRASOUND GUIDED RIGHT BREAST AND RIGHT AXILLA CORE NEEDLE BIOPSY ULTRASOUND GUIDED LEFT BREAST CORE NEEDLE BIOPSY COMPARISON:  Previous exam(s). PROCEDURE: I met with the patient and we discussed the procedure of ultrasound-guided biopsy, including benefits and alternatives. We discussed the high likelihood of a successful procedure. We discussed the risks of the procedure, including infection, bleeding, tissue injury, clip migration, and inadequate sampling. Informed written consent was given. The usual time-out protocol was performed immediately prior to the procedure. RIGHT BREAST 10 O'CLOCK: MASS: Lesion quadrant: UPPER-OUTER Using sterile technique and 1% Lidocaine as local anesthetic, under direct ultrasound visualization, a 14 gauge spring-loaded device was used to perform biopsy of the mass in the right breast at the 10 o'clock position using a lateral to medial approach. At the conclusion of the procedure a heart shaped tissue marker clip was deployed into the biopsy cavity. Follow up 2 view mammogram was performed and dictated separately. RIGHT AXILLA: LYMPH NODE: Lesion quadrant: UPPER-OUTER Using sterile technique and 1% Lidocaine as local anesthetic, under direct ultrasound visualization, a 14 gauge spring-loaded device was used to perform biopsy of the morphologically abnormal lymph node in the right axilla using a inferior to superior approach. At the conclusion of the procedure a HydroMARK tissue marker clip was deployed into the biopsy cavity. Follow up 2 view mammogram was performed and  dictated separately. LEFT BREAST 2 O'CLOCK: MASS: Lesion quadrant: UPPER-OUTER Using sterile technique and 1% Lidocaine as local anesthetic, under direct ultrasound visualization, a 14 gauge spring-loaded device was used to perform biopsy of the mass in the left breast at the 2 o'clock position using a lateral to medial  approach. At the conclusion of the procedure a venous shaped tissue marker clip was deployed into the biopsy cavity. Follow up 2 view mammogram was performed and dictated separately. IMPRESSION: 1. Ultrasound-guided biopsy of the mass in the right breast at the 10 o'clock position. 2.  Ultrasound-guided biopsy of the lymph node in the right axilla. 3. Ultrasound-guided biopsy of the mass in the left breast at the 2 o'clock position. Electronically Signed: By: Everlean Alstrom M.D. 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 This was found to be concordant by Dr. Everlean Alstrom. 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 This was found to be concordant by Dr. Everlean Alstrom. Pathology revealed FIBROADENOMA of the LEFT breast, 2:00 3cmfn. This was found to be concordant by Dr. Everlean Alstrom. Pathology results were discussed with the patient by telephone. The patient reported doing well after the biopsies with tenderness at the sites. Post biopsy instructions and care were reviewed and questions were answered. The patient was encouraged to call [facility] for any additional concerns. Al Pimple RN Nurse Navigator and BCCCP Coordinator has arranged appointment with Dr. Delight Hoh of [facility]. Pathology results reported by Stacie Acres RN on 11/13/2019. Electronically Signed    By: Everlean Alstrom M.D.   On: 11/13/2019 08:48   Result Date: 11/17/2019 CLINICAL DATA:  54 year old female with a suspicious mass in the right breast at the 10 o'clock position. Abnormal lymph node in the right axilla, and indeterminate mass in the left breast at the 2 o'clock position. EXAM: ULTRASOUND GUIDED RIGHT BREAST AND RIGHT AXILLA CORE NEEDLE BIOPSY ULTRASOUND GUIDED LEFT BREAST CORE NEEDLE BIOPSY COMPARISON:  Previous exam(s). PROCEDURE: I met with the patient and we discussed the procedure of ultrasound-guided biopsy, including benefits and alternatives. We discussed the high likelihood of a successful procedure. We discussed the risks of the procedure, including infection, bleeding, tissue injury, clip migration, and inadequate sampling. Informed written consent was given. The usual time-out protocol was performed immediately prior to the procedure. RIGHT BREAST 10 O'CLOCK: MASS: Lesion quadrant: UPPER-OUTER Using sterile technique and 1% Lidocaine as local anesthetic, under direct ultrasound visualization, a 14 gauge spring-loaded device was used to perform biopsy of the mass in the right breast at the 10 o'clock position using a lateral to medial approach. At the conclusion of the procedure a heart shaped tissue marker clip was deployed into the biopsy cavity. Follow up 2 view mammogram was performed and dictated separately. RIGHT AXILLA: LYMPH NODE: Lesion quadrant: UPPER-OUTER Using sterile technique and 1% Lidocaine as local anesthetic, under direct ultrasound visualization, a 14 gauge spring-loaded device was used to perform biopsy of the morphologically abnormal lymph node in the right axilla using a inferior to superior approach. At the conclusion of the procedure a HydroMARK tissue marker clip was deployed into the biopsy cavity. Follow up 2 view mammogram was performed and dictated separately. LEFT BREAST 2 O'CLOCK: MASS: Lesion quadrant: UPPER-OUTER Using sterile technique and 1%  Lidocaine as local anesthetic, under direct ultrasound visualization, a 14 gauge spring-loaded device was used to perform biopsy of the mass in the left breast at the 2 o'clock position using a lateral to medial approach. At the conclusion of the procedure  a venous shaped tissue marker clip was deployed into the biopsy cavity. Follow up 2 view mammogram was performed and dictated separately. IMPRESSION: 1. Ultrasound-guided biopsy of the mass in the right breast at the 10 o'clock position. 2.  Ultrasound-guided biopsy of the lymph node in the right axilla. 3. Ultrasound-guided biopsy of the mass in the left breast at the 2 o'clock position. Electronically Signed: By: Everlean Alstrom M.D. 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  PLAN:    1. 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  Case also discussed with surgery.  Given the size and stage of patient's tumor, she will benefit from neoadjuvant chemotherapy.  Will await HER-2 FISH results before determining regimen.  Given her history of XRT to the right breast, she likely will require mastectomy.  Patient will also benefit from an aromatase inhibitor for a minimum of 5 years at the conclusion of all her treatments.  Prior to initiating treatment will order CT scan of the chest, abdomen, and pelvis for staging purposes.  We will also get a MUGA.  Patient has port placement scheduled for November 30, 1999.  Patient will have video assisted telemedicine visit in 1 week to discuss her final pathology results and treatment regimen.  She will then return to clinic on December 02, 2019 to initiate cycle 1 of treatment. 2.  History of DCIS: Status post lumpectomy and XRT.  Patient reports she did not take any adjuvant hormonal therapy.  Given this history, have  referred patient to genetic counseling for evaluation and testing.  I spent a total of 60 minutes reviewing chart data, face-to-face evaluation with the patient, counseling and coordination of care as detailed above.  Patient expressed understanding and was in agreement with this plan. She also understands that She can call clinic at any time with any questions, concerns, or complaints.   Cancer Staging Primary cancer of upper outer quadrant of right female breast ([place]) Staging form: Breast, AJCC 8th Edition - Clinical: Stage IIIA (cT2, cN2a, cM0, G3, ER+, PR+, HER2: Equivocal) - Signed by Lloyd Huger, MD on 11/19/2019   Lloyd Huger, MD   11/19/2019 3:20 PM

## 2019-11-19 NOTE — Progress Notes (Signed)
Pt called to go over information related to first visit at [facility]. Team introduced and process for first visit gone over. Questions and concerns gone over. Concerns about work, only source of income for her and her husband.

## 2019-11-20 NOTE — Progress Notes (Signed)
  [facility]  [phone] [phone]) [phone]  ID: SIBEL STACHOWICZ OB: 1965/10/01  MR#: [id number]  [id number]  Patient Care Team: Patient, No Pcp Per as PCP - General (General Practice) Rico Junker, RN as Registered Nurse Theodore Demark, RN as Oncology Nurse Navigator (Oncology)    Lloyd Huger, MD   11/23/2019 11:28 AM     This encounter was created in error - please disregard.

## 2019-11-23 ENCOUNTER — Inpatient Hospital Stay: Payer: Medicaid Other | Admitting: Oncology

## 2019-11-23 DIAGNOSIS — C50411 Malignant neoplasm of upper-outer quadrant of right female breast: Secondary | ICD-10-CM

## 2019-11-24 ENCOUNTER — Encounter
Admission: RE | Admit: 2019-11-24 | Discharge: 2019-11-24 | Disposition: A | Discharge status: Home / Self Care | Payer: Medicaid Other | Source: Ambulatory Visit | Attending: General Surgery | Admitting: General Surgery

## 2019-11-24 ENCOUNTER — Other Ambulatory Visit: Payer: Self-pay

## 2019-11-24 HISTORY — DX: Chronic obstructive pulmonary disease, unspecified: J44.9

## 2019-11-24 HISTORY — DX: Benign neoplasm of brain, unspecified: D33.2

## 2019-11-24 HISTORY — DX: Pneumonia, unspecified organism: J18.9

## 2019-11-24 NOTE — Patient Instructions (Addendum)
Your procedure is scheduled on: Monday, March 29 Report to Day Surgery on the 2nd floor of the [facility]. To find out your arrival time, please call [phone] between 1PM - 3PM on: Friday, March 26  REMEMBER: Instructions that are not followed completely may result in serious medical risk, up to and including death; or upon the discretion of your surgeon and anesthesiologist your surgery may need to be rescheduled.  Do not eat food after midnight the night before surgery.  No gum chewing, lozengers or hard candies.  You may however, drink CLEAR liquids up to 2 hours before you are scheduled to arrive for your surgery. Do not drink anything within 2 hours of the start of your surgery.  Clear liquids include: - water  - apple juice without pulp - gatorade (not RED) - black coffee or tea (Do NOT add milk or creamers to the coffee or tea) Do NOT drink anything that is not on this list.  TAKE THESE MEDICATIONS THE MORNING OF SURGERY WITH A SIP OF WATER:  1.  Tylenol if needed for pain  Use inhalers on the day of surgery and bring to the hospital.  Stop Anti-inflammatories (NSAIDS) such as Advil, Aleve, Ibuprofen, Motrin, Naproxen, Naprosyn and Aspirin based products such as Excedrin, Goodys Powder, BC Powder. (May take Tylenol or Acetaminophen if needed.)  Stop ANY OVER THE COUNTER supplements until after surgery.  No Alcohol for 24 hours before or after surgery.  No Smoking including e-cigarettes for 24 hours prior to surgery.  No chewable tobacco products for at least 6 hours prior to surgery.  No nicotine patches on the day of surgery.  On the morning of surgery brush your teeth with toothpaste and water, you may rinse your mouth with mouthwash if you wish. Do not swallow any toothpaste or mouthwash.  Do not wear jewelry, make-up, hairpins, clips or nail polish.  Do not wear lotions, powders, or perfumes.   Do not shave 48 hours prior to surgery.   Contact lenses,  hearing aids and dentures may not be worn into surgery.  Do not bring valuables to the hospital, including drivers license, insurance or credit cards.  [facility] is not responsible for any belongings or valuables.   Use CHG Soap as directed on instruction sheet.  Notify your doctor if there is any change in your medical condition (cold, fever, infection).  Wear comfortable clothing (specific to your surgery type) to the hospital.  If you are being discharged the day of surgery, you will not be allowed to drive home. You will need a responsible adult to drive you home and stay with you that night.   If you are taking public transportation, you will need to have a responsible adult with you. Please confirm with your physician that it is acceptable to use public transportation.   Please call [phone] if you have any questions about these instructions.

## 2019-11-25 LAB — SURGICAL PATHOLOGY

## 2019-11-26 ENCOUNTER — Encounter: Payer: Self-pay | Admitting: Oncology

## 2019-11-26 ENCOUNTER — Inpatient Hospital Stay (HOSPITAL_BASED_OUTPATIENT_CLINIC_OR_DEPARTMENT_OTHER): Payer: Medicaid Other | Admitting: Oncology

## 2019-11-26 ENCOUNTER — Other Ambulatory Visit: Payer: Self-pay

## 2019-11-26 ENCOUNTER — Other Ambulatory Visit: Payer: Medicaid Other

## 2019-11-26 ENCOUNTER — Ambulatory Visit
Admission: RE | Admit: 2019-11-26 | Discharge: 2019-11-26 | Disposition: A | Discharge status: Home / Self Care | Payer: Medicaid Other | Source: Ambulatory Visit | Attending: Oncology | Admitting: Oncology

## 2019-11-26 ENCOUNTER — Encounter
Admission: RE | Admit: 2019-11-26 | Discharge: 2019-11-26 | Disposition: A | Discharge status: Home / Self Care | Payer: Medicaid Other | Source: Ambulatory Visit | Attending: General Surgery | Admitting: General Surgery

## 2019-11-26 VITALS — BP 129/93 | HR 83 | Temp 97.5°F | Resp 18 | Wt 169.5 lb

## 2019-11-26 DIAGNOSIS — Z20822 Contact with and (suspected) exposure to covid-19: Secondary | ICD-10-CM | POA: Diagnosis not present

## 2019-11-26 DIAGNOSIS — Z01818 Encounter for other preprocedural examination: Secondary | ICD-10-CM | POA: Diagnosis present

## 2019-11-26 DIAGNOSIS — C50919 Malignant neoplasm of unspecified site of unspecified female breast: Secondary | ICD-10-CM | POA: Insufficient documentation

## 2019-11-26 DIAGNOSIS — Z7189 Other specified counseling: Secondary | ICD-10-CM | POA: Diagnosis not present

## 2019-11-26 DIAGNOSIS — C50411 Malignant neoplasm of upper-outer quadrant of right female breast: Secondary | ICD-10-CM | POA: Diagnosis not present

## 2019-11-26 DIAGNOSIS — Z5111 Encounter for antineoplastic chemotherapy: Secondary | ICD-10-CM | POA: Diagnosis not present

## 2019-11-26 DIAGNOSIS — I10 Essential (primary) hypertension: Secondary | ICD-10-CM | POA: Insufficient documentation

## 2019-11-26 LAB — CBC
HCT: 43 % (ref 36.0–46.0)
Hemoglobin: 14.4 g/dL (ref 12.0–15.0)
MCH: 30.9 pg (ref 26.0–34.0)
MCHC: 33.5 g/dL (ref 30.0–36.0)
MCV: 92.3 fL (ref 80.0–100.0)
Platelets: 239 10*3/uL (ref 150–400)
RBC: 4.66 MIL/uL (ref 3.87–5.11)
RDW: 12.4 % (ref 11.5–15.5)
WBC: 7.9 10*3/uL (ref 4.0–10.5)
nRBC: 0 % (ref 0.0–0.2)

## 2019-11-26 LAB — BASIC METABOLIC PANEL
Anion gap: 11 (ref 5–15)
BUN: 11 mg/dL (ref 6–20)
CO2: 24 mmol/L (ref 22–32)
Calcium: 9.3 mg/dL (ref 8.9–10.3)
Chloride: 107 mmol/L (ref 98–111)
Creatinine, Ser: 0.76 mg/dL (ref 0.44–1.00)
GFR calc Af Amer: >60 mL/min (ref >60)
GFR calc non Af Amer: >60 mL/min (ref >60)
Glucose, Bld: 83 mg/dL (ref 70–99)
Potassium: 4 mmol/L (ref 3.5–5.1)
Sodium: 142 mmol/L (ref 135–145)

## 2019-11-26 MED ORDER — ONDANSETRON HCL 8 MG PO TABS: 8.0000 mg | ORAL_TABLET | Freq: Two times a day (BID) | ORAL | 2 refills | Status: DC | PRN

## 2019-11-26 MED ORDER — PROCHLORPERAZINE MALEATE 10 MG PO TABS: 10.0000 mg | ORAL_TABLET | Freq: Four times a day (QID) | ORAL | 2 refills | Status: DC | PRN

## 2019-11-26 MED ORDER — LIDOCAINE-PRILOCAINE 2.5-2.5 % EX CREA: TOPICAL_CREAM | CUTANEOUS | 3 refills | Status: DC

## 2019-11-26 MED ORDER — TECHNETIUM TC 99M-LABELED RED BLOOD CELLS IV KIT
20.0000 | PACK | Freq: Once | INTRAVENOUS | Status: AC | PRN
  Administered 2019-11-26: 21.7 via INTRAVENOUS

## 2019-11-26 NOTE — Progress Notes (Signed)
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  INTERVAL HISTORY: Patient returns to clinic today for further evaluation, discussion of her final pathology results, and treatment planning.  She is anxious, but otherwise feels well. She has no neurologic complaints.  She denies any recent fevers or illnesses.  She has a good appetite and denies weight loss.  She has no chest pain, shortness of breath, cough, or hemoptysis.  She denies any nausea, vomiting, constipation, or diarrhea.  She has no urinary complaints.  Patient offers no specific complaints today.    REVIEW OF SYSTEMS:   Review of Systems  Constitutional: Negative.  Negative for fever, malaise/fatigue and weight loss.  Respiratory: Negative.  Negative for cough, hemoptysis and shortness of breath.   Cardiovascular: Negative.  Negative for chest pain and leg swelling.  Gastrointestinal: Negative.  Negative for abdominal pain.  Genitourinary: Negative.  Negative for dysuria.  Musculoskeletal: Negative.  Negative for back pain.  Skin: Negative.  Negative for rash.  Neurological: Negative.  Negative for dizziness, focal weakness, weakness and headaches.    As per HPI. Otherwise, a complete review of systems is negative.  PAST MEDICAL HISTORY: Past Medical History:  Diagnosis Date  . Brain tumor (benign) ([place])   . Breast cancer ([place]) 1999   Status post right breast lumpectomy and radiation  . COPD (chronic obstructive pulmonary disease) ([place])   . Depression   . History of chemotherapy 1999  . History of seizure disorder   .  Hypertension   . Personal history of radiation therapy 1999  . Pneumonia     PAST SURGICAL HISTORY: Past Surgical History:  Procedure Laterality Date  . BRAIN SURGERY    . Brain tumor resection  1991  . BREAST BIOPSY Right 1999   Positive  . BREAST BIOPSY Right 11/10/2019   [place] bx 10:00 heart marker, path pending  . BREAST BIOPSY Right 11/10/2019   [place] bx axilla, hydromark 4, path pending  . BREAST BIOPSY Left 11/10/2019   [place] bx 2:00, venus marker, path pending  . Right breast lumpectomy    . TONSILLECTOMY    . TUBAL LIGATION      FAMILY HISTORY: Family History  Problem Relation Age of Onset  . Depression Mother   . CAD Father   . Hypertension Father   . Lung cancer Father   . Breast cancer Maternal Grandmother     ADVANCED DIRECTIVES (Y/N):  N  HEALTH MAINTENANCE: Social History   Tobacco Use  . Smoking status: Current Every Day Smoker    Packs/day: 0.50    Types: Cigarettes  . Smokeless tobacco: Never Used  Substance Use Topics  . Alcohol use: No    Alcohol/week: 0.0 standard drinks  . Drug use: No    Comment: Marijuana a year ago 2020     Colonoscopy:  PAP:  Bone density:  Lipid panel:  Allergies  Allergen Reactions  . Imitrex [Sumatriptan] Anaphylaxis  . Penicillins Rash    Did it involve swelling of the face/tongue/throat, SOB, or low BP? Unknown Did it involve sudden or severe rash/hives, skin peeling, or any reaction on the inside of your  mouth or nose? Unknown Did you need to seek medical attention at a hospital or doctor's office? Unknown When did it last happen?? If all above answers are "NO", may proceed with cephalosporin use.     Current Outpatient Medications  Medication Sig Dispense Refill  . acetaminophen (TYLENOL) 500 MG tablet Take 500-1,000 mg by mouth every 6 (six) hours as needed (for pain.).    Marland Kitchen albuterol (VENTOLIN HFA) 108 (90 Base) MCG/ACT inhaler Inhale 1-2 puffs into the lungs every 6 (six) hours as needed for wheezing or  shortness of breath.    Marland Kitchen amLODipine (NORVASC) 10 MG tablet Take 10 mg by mouth daily.     Marland Kitchen lidocaine-prilocaine (EMLA) cream Apply to affected area once 30 g 3  . Melatonin 5 MG TABS Take 1 tablet by mouth at bedtime and may repeat dose one time if needed.    . ondansetron (ZOFRAN) 8 MG tablet Take 1 tablet (8 mg total) by mouth 2 (two) times daily as needed. 60 tablet 2  . prochlorperazine (COMPAZINE) 10 MG tablet Take 1 tablet (10 mg total) by mouth every 6 (six) hours as needed (Nausea or vomiting). 60 tablet 2  . Pseudoeph-Doxylamine-DM-APAP (NYQUIL PO) Take 1-2 capsules by mouth at bedtime as needed (sleep.).     Marland Kitchen tetrahydrozoline 0.05 % ophthalmic solution Place 1-2 drops into both eyes 3 (three) times daily as needed (dry/irritated eyes).     No current facility-administered medications for this visit.    OBJECTIVE: Vitals:   11/26/19 1426  BP: (!) 129/93  Pulse: 83  Resp: 18  Temp: (!) 97.5 F (36.4 C)  SpO2: 100%     Body mass index is 26.55 kg/m.    ECOG FS:0 - Asymptomatic  General: Well-developed, well-nourished, no acute distress. Eyes: Pink conjunctiva, anicteric sclera. HEENT: Normocephalic, moist mucous membranes. Breast: Easily palpable right breast mass.  Exam deferred today. Lungs: No audible wheezing or coughing. Heart: Regular rate and rhythm. Abdomen: Soft, nontender, no obvious distention. Musculoskeletal: No edema, cyanosis, or clubbing. Neuro: Alert, answering all questions appropriately. Cranial nerves grossly intact. Skin: No rashes or petechiae noted. Psych: Normal affect.   LAB RESULTS:  Lab Results  Component Value Date   NA 142 11/26/2019   K 4.0 11/26/2019   CL 107 11/26/2019   CO2 24 11/26/2019   GLUCOSE 83 11/26/2019   BUN 11 11/26/2019   CREATININE 0.76 11/26/2019   CALCIUM 9.3 11/26/2019   PROT 7.4 07/13/2015   ALBUMIN 4.2 07/13/2015   AST 19 07/13/2015   ALT 17 07/13/2015   ALKPHOS 101 07/13/2015   BILITOT 0.2 (L)  07/13/2015   GFRNONAA >60 11/26/2019   GFRAA >60 11/26/2019    Lab Results  Component Value Date   WBC 7.9 11/26/2019   HGB 14.4 11/26/2019   HCT 43.0 11/26/2019   MCV 92.3 11/26/2019   PLT 239 11/26/2019     STUDIES: NM Cardiac Muga Rest  Result Date: 11/26/2019 CLINICAL DATA:  RIGHT breast cancer, pre cardiotoxic chemotherapy EXAM: NUCLEAR MEDICINE CARDIAC BLOOD POOL IMAGING (MUGA) TECHNIQUE: Cardiac multi-gated acquisition was performed at rest following intravenous injection of Tc-53mlabeled red blood cells. RADIOPHARMACEUTICALS:  21.7 mCi [id number] in-vitro labeled red blood cells IV COMPARISON:  None FINDINGS: Calculated LEFT ventricular ejection fraction is 60.8%, normal. Study was obtained at a cardiac rate of 74 bpm. Patient was rhythmic during imaging. Cine analysis of the LEFT ventricle in 3 projections demonstrates normal LEFT ventricular wall motion. IMPRESSION: Normal LEFT ventricular ejection  fraction of 60.8% with normal LV wall motion. Electronically Signed   By: Lavonia Dana M.D.   On: 11/26/2019 16:18   US BREAST LTD UNI LEFT INC AXILLA  Result Date: 11/03/2019 CLINICAL DATA:  54 year old female presenting for evaluation of a palpable lump in the right breast at 11 o'clock identified on self breast exam. The patient has personal history of breast cancer in 1999 status post radiation and chemotherapy. She has family history of breast cancer in a maternal grandmother. EXAM: DIGITAL DIAGNOSTIC BILATERAL MAMMOGRAM WITH CAD AND TOMO ULTRASOUND BILATERAL BREAST COMPARISON:  None. ACR Breast Density Category c: The breast tissue is heterogeneously dense, which may obscure small masses. FINDINGS: A BB indicating the palpable site of concern has been placed on the upper outer anterior right breast. Deep to this marker, there is an obscured irregular mass measuring at least 2.5-3.0 cm. In the upper outer middle depth of the left breast, there is a small obscured mass  measuring approximately 6 mm. No other suspicious calcifications, masses or areas of distortion are seen in the bilateral breasts. Mammographic images were processed with CAD. Ultrasound of the palpable site in the right breast at 10 o'clock, 2 cm from the nipple demonstrates a hypoechoic irregular mass measuring 2.6 x 1.0 x 2.3 cm. Ultrasound of the right axilla demonstrates 2 abnormal appearing lymph nodes with cortices measuring up to 8 mm. Ultrasound of the left breast at 2 o'clock, 3 cm from the nipple demonstrates a circumscribed oval hypoechoic mass measuring 0.7 x 0.3 x 0.6 cm. Ultrasound of the left axilla demonstrates multiple normal-appearing lymph nodes. IMPRESSION: 1.  There is a suspicious mass in the right breast at 10 o'clock. 2.  There are 2 abnormally thickened right axillary lymph nodes. 3.  There is an indeterminate mass in the left breast at 2 o'clock. 4.  No evidence of left axillary lymphadenopathy. RECOMMENDATION: Ultrasound guided biopsy is recommended for the right breast mass, right axillary lymph node and the left breast mass. I have discussed the findings and recommendations with the patient. If applicable, a reminder letter will be sent to the patient regarding the next appointment. BI-RADS CATEGORY  5: Highly suggestive of malignancy. Electronically Signed   By: Ammie Ferrier M.D.   On: 11/03/2019 10:53   US BREAST LTD UNI RIGHT INC AXILLA  Result Date: 11/03/2019 CLINICAL DATA:  54 year old female presenting for evaluation of a palpable lump in the right breast at 11 o'clock identified on self breast exam. The patient has personal history of breast cancer in 1999 status post radiation and chemotherapy. She has family history of breast cancer in a maternal grandmother. EXAM: DIGITAL DIAGNOSTIC BILATERAL MAMMOGRAM WITH CAD AND TOMO ULTRASOUND BILATERAL BREAST COMPARISON:  None. ACR Breast Density Category c: The breast tissue is heterogeneously dense, which may obscure small  masses. FINDINGS: A BB indicating the palpable site of concern has been placed on the upper outer anterior right breast. Deep to this marker, there is an obscured irregular mass measuring at least 2.5-3.0 cm. In the upper outer middle depth of the left breast, there is a small obscured mass measuring approximately 6 mm. No other suspicious calcifications, masses or areas of distortion are seen in the bilateral breasts. Mammographic images were processed with CAD. Ultrasound of the palpable site in the right breast at 10 o'clock, 2 cm from the nipple demonstrates a hypoechoic irregular mass measuring 2.6 x 1.0 x 2.3 cm. Ultrasound of the right axilla demonstrates 2 abnormal appearing lymph nodes  with cortices measuring up to 8 mm. Ultrasound of the left breast at 2 o'clock, 3 cm from the nipple demonstrates a circumscribed oval hypoechoic mass measuring 0.7 x 0.3 x 0.6 cm. Ultrasound of the left axilla demonstrates multiple normal-appearing lymph nodes. IMPRESSION: 1.  There is a suspicious mass in the right breast at 10 o'clock. 2.  There are 2 abnormally thickened right axillary lymph nodes. 3.  There is an indeterminate mass in the left breast at 2 o'clock. 4.  No evidence of left axillary lymphadenopathy. RECOMMENDATION: Ultrasound guided biopsy is recommended for the right breast mass, right axillary lymph node and the left breast mass. I have discussed the findings and recommendations with the patient. If applicable, a reminder letter will be sent to the patient regarding the next appointment. BI-RADS CATEGORY  5: Highly suggestive of malignancy. Electronically Signed   By: Ammie Ferrier M.D.   On: 11/03/2019 10:53   MS DIGITAL DIAG TOMO BILAT  Result Date: 11/03/2019 CLINICAL DATA:  54 year old female presenting for evaluation of a palpable lump in the right breast at 11 o'clock identified on self breast exam. The patient has personal history of breast cancer in 1999 status post radiation and  chemotherapy. She has family history of breast cancer in a maternal grandmother. EXAM: DIGITAL DIAGNOSTIC BILATERAL MAMMOGRAM WITH CAD AND TOMO ULTRASOUND BILATERAL BREAST COMPARISON:  None. ACR Breast Density Category c: The breast tissue is heterogeneously dense, which may obscure small masses. FINDINGS: A BB indicating the palpable site of concern has been placed on the upper outer anterior right breast. Deep to this marker, there is an obscured irregular mass measuring at least 2.5-3.0 cm. In the upper outer middle depth of the left breast, there is a small obscured mass measuring approximately 6 mm. No other suspicious calcifications, masses or areas of distortion are seen in the bilateral breasts. Mammographic images were processed with CAD. Ultrasound of the palpable site in the right breast at 10 o'clock, 2 cm from the nipple demonstrates a hypoechoic irregular mass measuring 2.6 x 1.0 x 2.3 cm. Ultrasound of the right axilla demonstrates 2 abnormal appearing lymph nodes with cortices measuring up to 8 mm. Ultrasound of the left breast at 2 o'clock, 3 cm from the nipple demonstrates a circumscribed oval hypoechoic mass measuring 0.7 x 0.3 x 0.6 cm. Ultrasound of the left axilla demonstrates multiple normal-appearing lymph nodes. IMPRESSION: 1.  There is a suspicious mass in the right breast at 10 o'clock. 2.  There are 2 abnormally thickened right axillary lymph nodes. 3.  There is an indeterminate mass in the left breast at 2 o'clock. 4.  No evidence of left axillary lymphadenopathy. RECOMMENDATION: Ultrasound guided biopsy is recommended for the right breast mass, right axillary lymph node and the left breast mass. I have discussed the findings and recommendations with the patient. If applicable, a reminder letter will be sent to the patient regarding the next appointment. BI-RADS CATEGORY  5: Highly suggestive of malignancy. Electronically Signed   By: Ammie Ferrier M.D.   On: 11/03/2019 10:53   MS  CLIP PLACEMENT LEFT  Result Date: 11/10/2019 CLINICAL DATA:  Post ultrasound-guided biopsy of a mass in the right breast at the 10 o'clock position, ultrasound-guided biopsy of an abnormal lymph node in the right axilla and ultrasound-guided biopsy of a mass in the left breast at the 2 o'clock position. EXAM: DIAGNOSTIC BILATERAL MAMMOGRAM POST ULTRASOUND BIOPSY COMPARISON:  Previous exam(s). FINDINGS: Mammographic images were obtained following ultrasound-guided biopsy of a  mass in the right breast at the 10 o'clock position, ultrasound-guided biopsy of an abnormal lymph node in the right axilla and ultrasound-guided biopsy of a mass in the left breast at the 2 o'clock position. Heart shaped biopsy marking clip is present at the site of the biopsied mass in the right breast at the 10 o'clock position. The biopsied lymph node in the right axilla could not be included in the field of view. A venous shaped biopsy marking clip is present at the site of the biopsied mass in the left breast at the 2 o'clock position. IMPRESSION: 1. Heart shaped biopsy marking clip at site of biopsied mass in the right breast at the 10 o'clock position. 2. Biopsied lymph node could not be included in the field of view on the post biopsy mammogram. 3. Venus shaped biopsy marking clip at site of biopsied mass in the left breast at the 2 o'clock position. Final Assessment: Post Procedure Mammograms for Marker Placement Electronically Signed   By: Everlean Alstrom M.D.   On: 11/10/2019 09:40   MS CLIP PLACEMENT RIGHT  Result Date: 11/10/2019 CLINICAL DATA:  Post ultrasound-guided biopsy of a mass in the right breast at the 10 o'clock position, ultrasound-guided biopsy of an abnormal lymph node in the right axilla and ultrasound-guided biopsy of a mass in the left breast at the 2 o'clock position. EXAM: DIAGNOSTIC BILATERAL MAMMOGRAM POST ULTRASOUND BIOPSY COMPARISON:  Previous exam(s). FINDINGS: Mammographic images were obtained following  ultrasound-guided biopsy of a mass in the right breast at the 10 o'clock position, ultrasound-guided biopsy of an abnormal lymph node in the right axilla and ultrasound-guided biopsy of a mass in the left breast at the 2 o'clock position. Heart shaped biopsy marking clip is present at the site of the biopsied mass in the right breast at the 10 o'clock position. The biopsied lymph node in the right axilla could not be included in the field of view. A venous shaped biopsy marking clip is present at the site of the biopsied mass in the left breast at the 2 o'clock position. IMPRESSION: 1. Heart shaped biopsy marking clip at site of biopsied mass in the right breast at the 10 o'clock position. 2. Biopsied lymph node could not be included in the field of view on the post biopsy mammogram. 3. Venus shaped biopsy marking clip at site of biopsied mass in the left breast at the 2 o'clock position. Final Assessment: Post Procedure Mammograms for Marker Placement Electronically Signed   By: Everlean Alstrom M.D.   On: 11/10/2019 09:40   [place] LT BREAST BX W LOC DEV 1ST LESION IMG BX SPEC US GUIDE  Addendum Date: 11/17/2019   ADDENDUM REPORT: 11/13/2019 08:48 ADDENDUM: Pathology revealed GRADE III INVASIVE MAMMARY CARCINOMA of the RIGHT breast, 10:00 2cmfn. This was found to be concordant by Dr. Everlean Alstrom. 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 This was found to be concordant by Dr. Everlean Alstrom. Pathology revealed FIBROADENOMA of the LEFT breast, 2:00 3cmfn. This was found to be concordant by Dr. Everlean Alstrom. Pathology results were discussed with the patient by telephone. The patient reported doing well after the biopsies with tenderness at the sites. Post biopsy instructions and care were reviewed and questions were answered. The patient was encouraged to call [facility] for any additional concerns. Al Pimple RN  Nurse Navigator and BCCCP Coordinator has arranged appointment with Dr. Delight Hoh of [facility]  [place]. Pathology results reported by Stacie Acres RN on 11/13/2019. Electronically Signed   By: Everlean Alstrom M.D.   On: 11/13/2019 08:48   Result Date: 11/17/2019 CLINICAL DATA:  54 year old female with a suspicious mass in the right breast at the 10 o'clock position. Abnormal lymph node in the right axilla, and indeterminate mass in the left breast at the 2 o'clock position. EXAM: ULTRASOUND GUIDED RIGHT BREAST AND RIGHT AXILLA CORE NEEDLE BIOPSY ULTRASOUND GUIDED LEFT BREAST CORE NEEDLE BIOPSY COMPARISON:  Previous exam(s). PROCEDURE: I met with the patient and we discussed the procedure of ultrasound-guided biopsy, including benefits and alternatives. We discussed the high likelihood of a successful procedure. We discussed the risks of the procedure, including infection, bleeding, tissue injury, clip migration, and inadequate sampling. Informed written consent was given. The usual time-out protocol was performed immediately prior to the procedure. RIGHT BREAST 10 O'CLOCK: MASS: Lesion quadrant: UPPER-OUTER Using sterile technique and 1% Lidocaine as local anesthetic, under direct ultrasound visualization, a 14 gauge spring-loaded device was used to perform biopsy of the mass in the right breast at the 10 o'clock position using a lateral to medial approach. At the conclusion of the procedure a heart shaped tissue marker clip was deployed into the biopsy cavity. Follow up 2 view mammogram was performed and dictated separately. RIGHT AXILLA: LYMPH NODE: Lesion quadrant: UPPER-OUTER Using sterile technique and 1% Lidocaine as local anesthetic, under direct ultrasound visualization, a 14 gauge spring-loaded device was used to perform biopsy of the morphologically abnormal lymph node in the right axilla using a inferior to superior approach. At the conclusion of the procedure a HydroMARK tissue  marker clip was deployed into the biopsy cavity. Follow up 2 view mammogram was performed and dictated separately. LEFT BREAST 2 O'CLOCK: MASS: Lesion quadrant: UPPER-OUTER Using sterile technique and 1% Lidocaine as local anesthetic, under direct ultrasound visualization, a 14 gauge spring-loaded device was used to perform biopsy of the mass in the left breast at the 2 o'clock position using a lateral to medial approach. At the conclusion of the procedure a venous shaped tissue marker clip was deployed into the biopsy cavity. Follow up 2 view mammogram was performed and dictated separately. IMPRESSION: 1. Ultrasound-guided biopsy of the mass in the right breast at the 10 o'clock position. 2.  Ultrasound-guided biopsy of the lymph node in the right axilla. 3. Ultrasound-guided biopsy of the mass in the left breast at the 2 o'clock position. Electronically Signed: By: Everlean Alstrom M.D. 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 This was found to be concordant by Dr. Everlean Alstrom. 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 This was found to be concordant by Dr. Everlean Alstrom. Pathology revealed FIBROADENOMA of the LEFT breast, 2:00 3cmfn. This was found to be concordant by Dr. Everlean Alstrom. Pathology results were discussed with the patient by telephone. The patient reported doing well after the biopsies with tenderness at the sites. Post biopsy instructions and care were reviewed and questions were answered. The patient was encouraged to call [facility] for any additional concerns. Al Pimple RN Nurse Navigator and BCCCP Coordinator has arranged appointment with Dr. Delight Hoh of [facility]. Pathology results reported by Stacie Acres  RN on 11/13/2019. Electronically Signed   By: Everlean Alstrom M.D.   On: 11/13/2019 08:48   Result Date: 11/17/2019 CLINICAL DATA:  54 year old female with a suspicious mass in the right breast at the 10 o'clock position. Abnormal lymph node in the right axilla, and indeterminate mass in the left breast at the 2 o'clock position. EXAM: ULTRASOUND GUIDED RIGHT BREAST AND RIGHT AXILLA CORE NEEDLE BIOPSY ULTRASOUND GUIDED LEFT BREAST CORE NEEDLE BIOPSY COMPARISON:  Previous exam(s). PROCEDURE: I met with the patient and we discussed the procedure of ultrasound-guided biopsy, including benefits and alternatives. We discussed the high likelihood of a successful procedure. We discussed the risks of the procedure, including infection, bleeding, tissue injury, clip migration, and inadequate sampling. Informed written consent was given. The usual time-out protocol was performed immediately prior to the procedure. RIGHT BREAST 10 O'CLOCK: MASS: Lesion quadrant: UPPER-OUTER Using sterile technique and 1% Lidocaine as local anesthetic, under direct ultrasound visualization, a 14 gauge spring-loaded device was used to perform biopsy of the mass in the right breast at the 10 o'clock position using a lateral to medial approach. At the conclusion of the procedure a heart shaped tissue marker clip was deployed into the biopsy cavity. Follow up 2 view mammogram was performed and dictated separately. RIGHT AXILLA: LYMPH NODE: Lesion quadrant: UPPER-OUTER Using sterile technique and 1% Lidocaine as local anesthetic, under direct ultrasound visualization, a 14 gauge spring-loaded device was used to perform biopsy of the morphologically abnormal lymph node in the right axilla using a inferior to superior approach. At the conclusion of the procedure a HydroMARK tissue marker clip was deployed into the biopsy cavity. Follow up 2 view mammogram was performed and dictated separately. LEFT  BREAST 2 O'CLOCK: MASS: Lesion quadrant: UPPER-OUTER Using sterile technique and 1% Lidocaine as local anesthetic, under direct ultrasound visualization, a 14 gauge spring-loaded device was used to perform biopsy of the mass in the left breast at the 2 o'clock position using a lateral to medial approach. At the conclusion of the procedure a venous shaped tissue marker clip was deployed into the biopsy cavity. Follow up 2 view mammogram was performed and dictated separately. IMPRESSION: 1. Ultrasound-guided biopsy of the mass in the right breast at the 10 o'clock position. 2.  Ultrasound-guided biopsy of the lymph node in the right axilla. 3. Ultrasound-guided biopsy of the mass in the left breast at the 2 o'clock position. Electronically Signed: By: Everlean Alstrom M.D. 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 This was found to be concordant by Dr. Everlean Alstrom. 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 This was found to be concordant by Dr. Everlean Alstrom. Pathology revealed FIBROADENOMA of the LEFT breast, 2:00 3cmfn. This was found to be concordant by Dr. Everlean Alstrom. Pathology results were discussed with the patient by telephone. The patient reported doing well after the biopsies with tenderness at the sites. Post biopsy instructions and care were reviewed and questions were answered. The patient was encouraged to call [facility] for any additional concerns. Al Pimple RN Nurse Navigator and BCCCP Coordinator has arranged appointment with Dr. Delight Hoh of [facility]. Pathology results reported by Stacie Acres RN on 11/13/2019. Electronically Signed   By:  Everlean Alstrom M.D.    On: 11/13/2019 08:48   Result Date: 11/17/2019 CLINICAL DATA:  54 year old female with a suspicious mass in the right breast at the 10 o'clock position. Abnormal lymph node in the right axilla, and indeterminate mass in the left breast at the 2 o'clock position. EXAM: ULTRASOUND GUIDED RIGHT BREAST AND RIGHT AXILLA CORE NEEDLE BIOPSY ULTRASOUND GUIDED LEFT BREAST CORE NEEDLE BIOPSY COMPARISON:  Previous exam(s). PROCEDURE: I met with the patient and we discussed the procedure of ultrasound-guided biopsy, including benefits and alternatives. We discussed the high likelihood of a successful procedure. We discussed the risks of the procedure, including infection, bleeding, tissue injury, clip migration, and inadequate sampling. Informed written consent was given. The usual time-out protocol was performed immediately prior to the procedure. RIGHT BREAST 10 O'CLOCK: MASS: Lesion quadrant: UPPER-OUTER Using sterile technique and 1% Lidocaine as local anesthetic, under direct ultrasound visualization, a 14 gauge spring-loaded device was used to perform biopsy of the mass in the right breast at the 10 o'clock position using a lateral to medial approach. At the conclusion of the procedure a heart shaped tissue marker clip was deployed into the biopsy cavity. Follow up 2 view mammogram was performed and dictated separately. RIGHT AXILLA: LYMPH NODE: Lesion quadrant: UPPER-OUTER Using sterile technique and 1% Lidocaine as local anesthetic, under direct ultrasound visualization, a 14 gauge spring-loaded device was used to perform biopsy of the morphologically abnormal lymph node in the right axilla using a inferior to superior approach. At the conclusion of the procedure a HydroMARK tissue marker clip was deployed into the biopsy cavity. Follow up 2 view mammogram was performed and dictated separately. LEFT BREAST 2 O'CLOCK: MASS: Lesion quadrant: UPPER-OUTER Using sterile technique and 1% Lidocaine as local anesthetic, under  direct ultrasound visualization, a 14 gauge spring-loaded device was used to perform biopsy of the mass in the left breast at the 2 o'clock position using a lateral to medial approach. At the conclusion of the procedure a venous shaped tissue marker clip was deployed into the biopsy cavity. Follow up 2 view mammogram was performed and dictated separately. IMPRESSION: 1. Ultrasound-guided biopsy of the mass in the right breast at the 10 o'clock position. 2.  Ultrasound-guided biopsy of the lymph node in the right axilla. 3. Ultrasound-guided biopsy of the mass in the left breast at the 2 o'clock position. Electronically Signed: By: Everlean Alstrom M.D. 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  PLAN:    1. 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  Case also discussed with surgery.  Given the size and stage of patient's tumor, she will benefit from neoadjuvant chemotherapy with Adriamycin, Cytoxan and Udenyca support followed by weekly Taxol.  MUGA scan from November 26, 2019 revealed an EF of approximately 61%. CT scan of the chest, abdomen, and pelvis for staging purposes is scheduled for next week.  Patient has port placement scheduled for November 30, 2019.  Return to clinic on December 02, 2019 for further evaluation and initiation of cycle 1 of 4 of Adriamycin and Cytoxan. 2.  History of DCIS: Status post lumpectomy and XRT.  Patient reports she did not take any adjuvant hormonal therapy.  Given this history, have referred patient to genetic counseling for evaluation and testing.  I spent a total of 30 minutes reviewing chart data, face-to-face evaluation  with the patient, counseling and coordination of care as detailed above.   Patient expressed understanding and was in agreement with this plan. She also  understands that She can call clinic at any time with any questions, concerns, or complaints.   Cancer Staging Primary cancer of upper outer quadrant of right female breast [facility]) Staging form: Breast, AJCC 8th Edition - Clinical stage from 11/26/2019: Stage IIIA (cT2, cN2a, cM0, G3, ER+, PR+, HER2-) - Signed by Lloyd Huger, MD on 11/26/2019   Lloyd Huger, MD   11/26/2019 4:24 PM

## 2019-11-26 NOTE — Progress Notes (Signed)
Pt here for results of hormone receptor testing.

## 2019-11-26 NOTE — Progress Notes (Signed)
START ON PATHWAY REGIMEN - Breast     Cycles 1 through 4: A cycle is every 14 days:     Doxorubicin      Cyclophosphamide      Pegfilgrastim-xxxx    Cycles 5 through 16: A cycle is every 7 days:     Paclitaxel   **Always confirm dose/schedule in your pharmacy ordering system**  Patient Characteristics: Preoperative or Nonsurgical Candidate (Clinical Staging), Neoadjuvant Therapy followed by Surgery, Invasive Disease, Chemotherapy, HER2 Negative/Unknown/Equivocal, ER Positive Therapeutic Status: Preoperative or Nonsurgical Candidate (Clinical Staging) AJCC M Category: cM0 AJCC Grade: G3 Breast Surgical Plan: Neoadjuvant Therapy followed by Surgery ER Status: Positive (+) AJCC 8 Stage Grouping: IIIA HER2 Status: Negative (-) AJCC T Category: cT2 AJCC N Category: cN2a PR Status: Positive (+) Intent of Therapy: Curative Intent, Discussed with Patient 

## 2019-11-27 LAB — SARS CORONAVIRUS 2 (TAT 6-24 HRS): SARS Coronavirus 2: NEGATIVE

## 2019-11-27 NOTE — Progress Notes (Signed)
Working with patient on obtaining disability, and to assist with financial needs.  Patient was able to see Dr. Grayland Ormond in person yesterday rather than virtual visit.

## 2019-11-28 NOTE — Progress Notes (Signed)
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  INTERVAL HISTORY: Patient returns to clinic today for further evaluation and consideration of cycle 1 of 4 of Adriamycin and Cytoxan.  She continues to feel anxious, but otherwise feels well. She has no neurologic complaints.  She denies any recent fevers or illnesses.  She has a good appetite and denies weight loss.  She has no chest pain, shortness of breath, cough, or hemoptysis.  She denies any nausea, vomiting, constipation, or diarrhea.  She has no urinary complaints.  Patient offers no specific complaints today.  REVIEW OF SYSTEMS:   Review of Systems  Constitutional: Negative.  Negative for fever, malaise/fatigue and weight loss.  Respiratory: Negative.  Negative for cough, hemoptysis and shortness of breath.   Cardiovascular: Negative.  Negative for chest pain and leg swelling.  Gastrointestinal: Negative.  Negative for abdominal pain.  Genitourinary: Negative.  Negative for dysuria.  Musculoskeletal: Negative.  Negative for back pain.  Skin: Negative.  Negative for rash.  Neurological: Negative.  Negative for dizziness, focal weakness, weakness and headaches.    As per HPI. Otherwise, a complete review of systems is negative.  PAST MEDICAL HISTORY: Past Medical History:  Diagnosis Date  . Brain tumor (benign) ([place])   . Breast cancer ([place]) 1999   Status post right breast lumpectomy and radiation  . COPD (chronic obstructive pulmonary disease) ([place])   . Depression   . History of chemotherapy 1999  . History of seizure disorder   .  Hypertension   . Personal history of radiation therapy 1999  . Pneumonia     PAST SURGICAL HISTORY: Past Surgical History:  Procedure Laterality Date  . BRAIN SURGERY    . Brain tumor resection  1991  . BREAST BIOPSY Right 1999   Positive  . BREAST BIOPSY Right 11/10/2019   [place] bx 10:00 heart marker, path pending  . BREAST BIOPSY Right 11/10/2019   [place] bx axilla, hydromark 4, path pending  . BREAST BIOPSY Left 11/10/2019   [place] bx 2:00, venus marker, path pending  . PORTACATH PLACEMENT Left 11/30/2019   Procedure: INSERTION PORT-A-CATH;  Surgeon: Robert Bellow, MD;  Location: [facility] ORS;  Service: General;  Laterality: Left;  . Right breast lumpectomy    . TONSILLECTOMY    . TUBAL LIGATION      FAMILY HISTORY: Family History  Problem Relation Age of Onset  . Depression Mother   . CAD Father   . Hypertension Father   . Lung cancer Father   . Breast cancer Maternal Grandmother     ADVANCED DIRECTIVES (Y/N):  N  HEALTH MAINTENANCE: Social History   Tobacco Use  . Smoking status: Current Every Day Smoker    Packs/day: 0.50    Types: Cigarettes  . Smokeless tobacco: Never Used  Substance Use Topics  . Alcohol use: No    Alcohol/week: 0.0 standard drinks  . Drug use: No    Comment: Marijuana a year ago 2020     Colonoscopy:  PAP:  Bone density:  Lipid panel:  Allergies  Allergen Reactions  . Imitrex [Sumatriptan] Anaphylaxis  . Penicillins Rash  Did it involve swelling of the face/tongue/throat, SOB, or low BP? Unknown Did it involve sudden or severe rash/hives, skin peeling, or any reaction on the inside of your mouth or nose? Unknown Did you need to seek medical attention at a hospital or doctor's office? Unknown When did it last happen?? If all above answers are "NO", may proceed with cephalosporin use.     Current Outpatient Medications  Medication Sig Dispense Refill  . acetaminophen (TYLENOL) 500 MG tablet Take 500-1,000 mg by mouth every 6  (six) hours as needed (for pain.).    Marland Kitchen albuterol (VENTOLIN HFA) 108 (90 Base) MCG/ACT inhaler Inhale 1-2 puffs into the lungs every 6 (six) hours as needed for wheezing or shortness of breath.    Marland Kitchen amLODipine (NORVASC) 10 MG tablet Take 10 mg by mouth daily.     Marland Kitchen lidocaine-prilocaine (EMLA) cream Apply to affected area once 30 g 3  . Melatonin 5 MG TABS Take 1 tablet by mouth at bedtime and may repeat dose one time if needed.    . ondansetron (ZOFRAN) 8 MG tablet Take 1 tablet (8 mg total) by mouth 2 (two) times daily as needed. 60 tablet 2  . prochlorperazine (COMPAZINE) 10 MG tablet Take 1 tablet (10 mg total) by mouth every 6 (six) hours as needed (Nausea or vomiting). 60 tablet 2  . Pseudoeph-Doxylamine-DM-APAP (NYQUIL PO) Take 1-2 capsules by mouth at bedtime as needed (sleep.).     Marland Kitchen tetrahydrozoline 0.05 % ophthalmic solution Place 1-2 drops into both eyes 3 (three) times daily as needed (dry/irritated eyes).     No current facility-administered medications for this visit.    OBJECTIVE: Vitals:   12/02/19 1210  BP: 125/85  Pulse: 82  Resp: 18     Body mass index is 26.5 kg/m.    ECOG FS:0 - Asymptomatic  General: Well-developed, well-nourished, no acute distress. Eyes: Pink conjunctiva, anicteric sclera. HEENT: Normocephalic, moist mucous membranes. Lungs: No audible wheezing or coughing. Heart: Regular rate and rhythm. Abdomen: Soft, nontender, no obvious distention. Musculoskeletal: No edema, cyanosis, or clubbing. Neuro: Alert, answering all questions appropriately. Cranial nerves grossly intact. Skin: No rashes or petechiae noted. Psych: Normal affect.  LAB RESULTS:  Lab Results  Component Value Date   NA 135 12/02/2019   K 3.7 12/02/2019   CL 103 12/02/2019   CO2 22 12/02/2019   GLUCOSE 95 12/02/2019   BUN 16 12/02/2019   CREATININE 0.73 12/02/2019   CALCIUM 9.1 12/02/2019   PROT 8.1 12/02/2019   ALBUMIN 4.5 12/02/2019   AST 17 12/02/2019   ALT 15  12/02/2019   ALKPHOS 120 12/02/2019   BILITOT 0.3 12/02/2019   GFRNONAA >60 12/02/2019   GFRAA >60 12/02/2019    Lab Results  Component Value Date   WBC 11.6 (H) 12/02/2019   NEUTROABS 8.6 (H) 12/02/2019   HGB 14.5 12/02/2019   HCT 42.3 12/02/2019   MCV 90.0 12/02/2019   PLT 270 12/02/2019     STUDIES: CT Chest W Contrast  Result Date: 12/01/2019 CLINICAL DATA:  Newly diagnosed right breast cancer. Pretreatment staging. EXAM: CT CHEST, ABDOMEN, AND PELVIS WITH CONTRAST TECHNIQUE: Multidetector CT imaging of the chest, abdomen and pelvis was performed following the standard protocol during bolus administration of intravenous contrast. CONTRAST:  150m OMNIPAQUE IOHEXOL 300 MG/ML  SOLN COMPARISON:  Report only from abdominal CT 05/17/2004. FINDINGS: CT CHEST FINDINGS Cardiovascular: Mild atherosclerosis of the aorta, great vessels and coronary arteries. There is a left subclavian Port-A-Cath which extends  to the lower SVC level. The heart size is normal. There is no pericardial effusion. Mediastinum/Nodes: There are asymmetric right axillary lymph nodes which are not pathologically enlarged, measuring up to 8 mm short axis on image 17/2. No enlarged mediastinal, hilar or internal mammary lymph nodes. The thyroid gland, trachea and esophagus demonstrate no significant findings. Lungs/Pleura: There is no pleural effusion. Moderate to severe centrilobular emphysema with mild biapical scarring. There is additional linear scarring or atelectasis at both lung bases. No suspicious nodularity. Musculoskeletal/Chest wall: Central right breast mass measuring up to 2.0 cm on image 27/2. No other chest wall mass or suspicious osseous findings. CT ABDOMEN AND PELVIS FINDINGS Hepatobiliary: There are several hepatic cysts, measuring up to 3.3 cm centrally in the left hepatic lobe on image 55/2. No suspicious hepatic findings. No evidence of gallstones, gallbladder wall thickening or biliary dilatation.  Pancreas: Unremarkable. No pancreatic ductal dilatation or surrounding inflammatory changes. Spleen: Normal in size without focal abnormality. Adrenals/Urinary Tract: Both adrenal glands appear normal. Small bilateral renal cysts. No evidence of renal mass, urinary tract calculus or hydronephrosis. The bladder appears normal. Stomach/Bowel: No evidence of bowel wall thickening, distention or surrounding inflammatory change. Mild sigmoid colon diverticular changes. Vascular/Lymphatic: There are no enlarged abdominal or pelvic lymph nodes. Aortic and branch vessel atherosclerosis. No acute vascular findings. Reproductive: The uterus and ovaries appear normal. Other: Intact anterior abdominal wall. No ascites or peritoneal nodularity. Musculoskeletal: No acute or significant osseous findings. Multilevel spondylosis with mild osseous foraminal narrowing bilaterally at L5-S1. There are sacral Tarlov cysts. IMPRESSION: 1. Central right breast mass consistent with primary breast cancer. There are asymmetric right axillary lymph nodes which are not pathologically enlarged. Patient has known right axillary nodal disease by previous biopsy 11/10/2019. 2. No other evidence of metastatic disease in the chest, abdomen or pelvis. 3. Hepatic and renal cysts. 4. Aortic Atherosclerosis (ICD10-I70.0) and Emphysema (ICD10-J43.9). Electronically Signed   By: Richardean Sale M.D.   On: 12/01/2019 13:24   NM Cardiac Muga Rest  Result Date: 11/26/2019 CLINICAL DATA:  RIGHT breast cancer, pre cardiotoxic chemotherapy EXAM: NUCLEAR MEDICINE CARDIAC BLOOD POOL IMAGING (MUGA) TECHNIQUE: Cardiac multi-gated acquisition was performed at rest following intravenous injection of Tc-103mlabeled red blood cells. RADIOPHARMACEUTICALS:  21.7 mCi Tc-942mertechnetate in-vitro labeled red blood cells IV COMPARISON:  None FINDINGS: Calculated LEFT ventricular ejection fraction is 60.8%, normal. Study was obtained at a cardiac rate of 74 bpm. Patient  was rhythmic during imaging. Cine analysis of the LEFT ventricle in 3 projections demonstrates normal LEFT ventricular wall motion. IMPRESSION: Normal LEFT ventricular ejection fraction of 60.8% with normal LV wall motion. Electronically Signed   By: MaLavonia Dana.D.   On: 11/26/2019 16:18   CT Abdomen Pelvis W Contrast  Result Date: 12/01/2019 CLINICAL DATA:  Newly diagnosed right breast cancer. Pretreatment staging. EXAM: CT CHEST, ABDOMEN, AND PELVIS WITH CONTRAST TECHNIQUE: Multidetector CT imaging of the chest, abdomen and pelvis was performed following the standard protocol during bolus administration of intravenous contrast. CONTRAST:  10042mMNIPAQUE IOHEXOL 300 MG/ML  SOLN COMPARISON:  Report only from abdominal CT 05/17/2004. FINDINGS: CT CHEST FINDINGS Cardiovascular: Mild atherosclerosis of the aorta, great vessels and coronary arteries. There is a left subclavian Port-A-Cath which extends to the lower SVC level. The heart size is normal. There is no pericardial effusion. Mediastinum/Nodes: There are asymmetric right axillary lymph nodes which are not pathologically enlarged, measuring up to 8 mm short axis on image 17/2. No enlarged mediastinal, hilar or internal  mammary lymph nodes. The thyroid gland, trachea and esophagus demonstrate no significant findings. Lungs/Pleura: There is no pleural effusion. Moderate to severe centrilobular emphysema with mild biapical scarring. There is additional linear scarring or atelectasis at both lung bases. No suspicious nodularity. Musculoskeletal/Chest wall: Central right breast mass measuring up to 2.0 cm on image 27/2. No other chest wall mass or suspicious osseous findings. CT ABDOMEN AND PELVIS FINDINGS Hepatobiliary: There are several hepatic cysts, measuring up to 3.3 cm centrally in the left hepatic lobe on image 55/2. No suspicious hepatic findings. No evidence of gallstones, gallbladder wall thickening or biliary dilatation. Pancreas: Unremarkable. No  pancreatic ductal dilatation or surrounding inflammatory changes. Spleen: Normal in size without focal abnormality. Adrenals/Urinary Tract: Both adrenal glands appear normal. Small bilateral renal cysts. No evidence of renal mass, urinary tract calculus or hydronephrosis. The bladder appears normal. Stomach/Bowel: No evidence of bowel wall thickening, distention or surrounding inflammatory change. Mild sigmoid colon diverticular changes. Vascular/Lymphatic: There are no enlarged abdominal or pelvic lymph nodes. Aortic and branch vessel atherosclerosis. No acute vascular findings. Reproductive: The uterus and ovaries appear normal. Other: Intact anterior abdominal wall. No ascites or peritoneal nodularity. Musculoskeletal: No acute or significant osseous findings. Multilevel spondylosis with mild osseous foraminal narrowing bilaterally at L5-S1. There are sacral Tarlov cysts. IMPRESSION: 1. Central right breast mass consistent with primary breast cancer. There are asymmetric right axillary lymph nodes which are not pathologically enlarged. Patient has known right axillary nodal disease by previous biopsy 11/10/2019. 2. No other evidence of metastatic disease in the chest, abdomen or pelvis. 3. Hepatic and renal cysts. 4. Aortic Atherosclerosis (ICD10-I70.0) and Emphysema (ICD10-J43.9). Electronically Signed   By: Richardean Sale M.D.   On: 12/01/2019 13:24   DG Chest Port 1 View  Result Date: 11/30/2019 CLINICAL DATA:  Follow-up Port-A-Cath placement. EXAM: PORTABLE CHEST 1 VIEW COMPARISON:  11/26/2019 FINDINGS: Port-A-Cath placed from a left subclavian approach. Tip is at the [facility] RA junction or just within the proximal right atrium. Patient has taken a poor inspiration. Allowing for that, the lungs are probably clear. Can not rule out an element of mild atelectasis or fluid overload, but favor findings are secondary to poor inspiration. No pneumothorax. IMPRESSION: Port-A-Cath tip at the SVC RA junction or just  within the right atrium. No pneumothorax. Poor inspiration. See above. Electronically Signed   By: Nelson Chimes M.D.   On: 11/30/2019 11:09   DG C-Arm 1-60 Min-No Report  Result Date: 11/30/2019 Fluoroscopy was utilized by the requesting physician.  No radiographic interpretation.   US BREAST LTD UNI LEFT INC AXILLA  Result Date: 11/03/2019 CLINICAL DATA:  54 year old female presenting for evaluation of a palpable lump in the right breast at 11 o'clock identified on self breast exam. The patient has personal history of breast cancer in 1999 status post radiation and chemotherapy. She has family history of breast cancer in a maternal grandmother. EXAM: DIGITAL DIAGNOSTIC BILATERAL MAMMOGRAM WITH CAD AND TOMO ULTRASOUND BILATERAL BREAST COMPARISON:  None. ACR Breast Density Category c: The breast tissue is heterogeneously dense, which may obscure small masses. FINDINGS: A BB indicating the palpable site of concern has been placed on the upper outer anterior right breast. Deep to this marker, there is an obscured irregular mass measuring at least 2.5-3.0 cm. In the upper outer middle depth of the left breast, there is a small obscured mass measuring approximately 6 mm. No other suspicious calcifications, masses or areas of distortion are seen in the bilateral breasts. Mammographic images  were processed with CAD. Ultrasound of the palpable site in the right breast at 10 o'clock, 2 cm from the nipple demonstrates a hypoechoic irregular mass measuring 2.6 x 1.0 x 2.3 cm. Ultrasound of the right axilla demonstrates 2 abnormal appearing lymph nodes with cortices measuring up to 8 mm. Ultrasound of the left breast at 2 o'clock, 3 cm from the nipple demonstrates a circumscribed oval hypoechoic mass measuring 0.7 x 0.3 x 0.6 cm. Ultrasound of the left axilla demonstrates multiple normal-appearing lymph nodes. IMPRESSION: 1.  There is a suspicious mass in the right breast at 10 o'clock. 2.  There are 2 abnormally  thickened right axillary lymph nodes. 3.  There is an indeterminate mass in the left breast at 2 o'clock. 4.  No evidence of left axillary lymphadenopathy. RECOMMENDATION: Ultrasound guided biopsy is recommended for the right breast mass, right axillary lymph node and the left breast mass. I have discussed the findings and recommendations with the patient. If applicable, a reminder letter will be sent to the patient regarding the next appointment. BI-RADS CATEGORY  5: Highly suggestive of malignancy. Electronically Signed   By: Ammie Ferrier M.D.   On: 11/03/2019 10:53   US BREAST LTD UNI RIGHT INC AXILLA  Result Date: 11/03/2019 CLINICAL DATA:  54 year old female presenting for evaluation of a palpable lump in the right breast at 11 o'clock identified on self breast exam. The patient has personal history of breast cancer in 1999 status post radiation and chemotherapy. She has family history of breast cancer in a maternal grandmother. EXAM: DIGITAL DIAGNOSTIC BILATERAL MAMMOGRAM WITH CAD AND TOMO ULTRASOUND BILATERAL BREAST COMPARISON:  None. ACR Breast Density Category c: The breast tissue is heterogeneously dense, which may obscure small masses. FINDINGS: A BB indicating the palpable site of concern has been placed on the upper outer anterior right breast. Deep to this marker, there is an obscured irregular mass measuring at least 2.5-3.0 cm. In the upper outer middle depth of the left breast, there is a small obscured mass measuring approximately 6 mm. No other suspicious calcifications, masses or areas of distortion are seen in the bilateral breasts. Mammographic images were processed with CAD. Ultrasound of the palpable site in the right breast at 10 o'clock, 2 cm from the nipple demonstrates a hypoechoic irregular mass measuring 2.6 x 1.0 x 2.3 cm. Ultrasound of the right axilla demonstrates 2 abnormal appearing lymph nodes with cortices measuring up to 8 mm. Ultrasound of the left breast at 2 o'clock,  3 cm from the nipple demonstrates a circumscribed oval hypoechoic mass measuring 0.7 x 0.3 x 0.6 cm. Ultrasound of the left axilla demonstrates multiple normal-appearing lymph nodes. IMPRESSION: 1.  There is a suspicious mass in the right breast at 10 o'clock. 2.  There are 2 abnormally thickened right axillary lymph nodes. 3.  There is an indeterminate mass in the left breast at 2 o'clock. 4.  No evidence of left axillary lymphadenopathy. RECOMMENDATION: Ultrasound guided biopsy is recommended for the right breast mass, right axillary lymph node and the left breast mass. I have discussed the findings and recommendations with the patient. If applicable, a reminder letter will be sent to the patient regarding the next appointment. BI-RADS CATEGORY  5: Highly suggestive of malignancy. Electronically Signed   By: Ammie Ferrier M.D.   On: 11/03/2019 10:53   MS DIGITAL DIAG TOMO BILAT  Result Date: 11/03/2019 CLINICAL DATA:  54 year old female presenting for evaluation of a palpable lump in the right breast at 80  o'clock identified on self breast exam. The patient has personal history of breast cancer in 1999 status post radiation and chemotherapy. She has family history of breast cancer in a maternal grandmother. EXAM: DIGITAL DIAGNOSTIC BILATERAL MAMMOGRAM WITH CAD AND TOMO ULTRASOUND BILATERAL BREAST COMPARISON:  None. ACR Breast Density Category c: The breast tissue is heterogeneously dense, which may obscure small masses. FINDINGS: A BB indicating the palpable site of concern has been placed on the upper outer anterior right breast. Deep to this marker, there is an obscured irregular mass measuring at least 2.5-3.0 cm. In the upper outer middle depth of the left breast, there is a small obscured mass measuring approximately 6 mm. No other suspicious calcifications, masses or areas of distortion are seen in the bilateral breasts. Mammographic images were processed with CAD. Ultrasound of the palpable site in  the right breast at 10 o'clock, 2 cm from the nipple demonstrates a hypoechoic irregular mass measuring 2.6 x 1.0 x 2.3 cm. Ultrasound of the right axilla demonstrates 2 abnormal appearing lymph nodes with cortices measuring up to 8 mm. Ultrasound of the left breast at 2 o'clock, 3 cm from the nipple demonstrates a circumscribed oval hypoechoic mass measuring 0.7 x 0.3 x 0.6 cm. Ultrasound of the left axilla demonstrates multiple normal-appearing lymph nodes. IMPRESSION: 1.  There is a suspicious mass in the right breast at 10 o'clock. 2.  There are 2 abnormally thickened right axillary lymph nodes. 3.  There is an indeterminate mass in the left breast at 2 o'clock. 4.  No evidence of left axillary lymphadenopathy. RECOMMENDATION: Ultrasound guided biopsy is recommended for the right breast mass, right axillary lymph node and the left breast mass. I have discussed the findings and recommendations with the patient. If applicable, a reminder letter will be sent to the patient regarding the next appointment. BI-RADS CATEGORY  5: Highly suggestive of malignancy. Electronically Signed   By: Ammie Ferrier M.D.   On: 11/03/2019 10:53   MS CLIP PLACEMENT LEFT  Result Date: 11/10/2019 CLINICAL DATA:  Post ultrasound-guided biopsy of a mass in the right breast at the 10 o'clock position, ultrasound-guided biopsy of an abnormal lymph node in the right axilla and ultrasound-guided biopsy of a mass in the left breast at the 2 o'clock position. EXAM: DIAGNOSTIC BILATERAL MAMMOGRAM POST ULTRASOUND BIOPSY COMPARISON:  Previous exam(s). FINDINGS: Mammographic images were obtained following ultrasound-guided biopsy of a mass in the right breast at the 10 o'clock position, ultrasound-guided biopsy of an abnormal lymph node in the right axilla and ultrasound-guided biopsy of a mass in the left breast at the 2 o'clock position. Heart shaped biopsy marking clip is present at the site of the biopsied mass in the right breast at the  10 o'clock position. The biopsied lymph node in the right axilla could not be included in the field of view. A venous shaped biopsy marking clip is present at the site of the biopsied mass in the left breast at the 2 o'clock position. IMPRESSION: 1. Heart shaped biopsy marking clip at site of biopsied mass in the right breast at the 10 o'clock position. 2. Biopsied lymph node could not be included in the field of view on the post biopsy mammogram. 3. Venus shaped biopsy marking clip at site of biopsied mass in the left breast at the 2 o'clock position. Final Assessment: Post Procedure Mammograms for Marker Placement Electronically Signed   By: Everlean Alstrom M.D.   On: 11/10/2019 09:40   MS CLIP PLACEMENT RIGHT  Result Date: 11/10/2019 CLINICAL DATA:  Post ultrasound-guided biopsy of a mass in the right breast at the 10 o'clock position, ultrasound-guided biopsy of an abnormal lymph node in the right axilla and ultrasound-guided biopsy of a mass in the left breast at the 2 o'clock position. EXAM: DIAGNOSTIC BILATERAL MAMMOGRAM POST ULTRASOUND BIOPSY COMPARISON:  Previous exam(s). FINDINGS: Mammographic images were obtained following ultrasound-guided biopsy of a mass in the right breast at the 10 o'clock position, ultrasound-guided biopsy of an abnormal lymph node in the right axilla and ultrasound-guided biopsy of a mass in the left breast at the 2 o'clock position. Heart shaped biopsy marking clip is present at the site of the biopsied mass in the right breast at the 10 o'clock position. The biopsied lymph node in the right axilla could not be included in the field of view. A venous shaped biopsy marking clip is present at the site of the biopsied mass in the left breast at the 2 o'clock position. IMPRESSION: 1. Heart shaped biopsy marking clip at site of biopsied mass in the right breast at the 10 o'clock position. 2. Biopsied lymph node could not be included in the field of view on the post biopsy  mammogram. 3. Venus shaped biopsy marking clip at site of biopsied mass in the left breast at the 2 o'clock position. Final Assessment: Post Procedure Mammograms for Marker Placement Electronically Signed   By: Everlean Alstrom M.D.   On: 11/10/2019 09:40   [place] LT BREAST BX W LOC DEV 1ST LESION IMG BX SPEC US GUIDE  Addendum Date: 11/17/2019   ADDENDUM REPORT: 11/13/2019 08:48 ADDENDUM: Pathology revealed GRADE III INVASIVE MAMMARY CARCINOMA of the RIGHT breast, 10:00 2cmfn. This was found to be concordant by Dr. Everlean Alstrom. 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 This was found to be concordant by Dr. Everlean Alstrom. Pathology revealed FIBROADENOMA of the LEFT breast, 2:00 3cmfn. This was found to be concordant by Dr. Everlean Alstrom. Pathology results were discussed with the patient by telephone. The patient reported doing well after the biopsies with tenderness at the sites. Post biopsy instructions and care were reviewed and questions were answered. The patient was encouraged to call The [facility] for any additional concerns. Al Pimple RN Nurse Navigator and BCCCP Coordinator has arranged appointment with Dr. Delight Hoh of [facility]. Pathology results reported by Stacie Acres RN on 11/13/2019. Electronically Signed   By: Everlean Alstrom M.D.   On: 11/13/2019 08:48   Result Date: 11/17/2019 CLINICAL DATA:  54 year old female with a suspicious mass in the right breast at the 10 o'clock position. Abnormal lymph node in the right axilla, and indeterminate mass in the left breast at the 2 o'clock position. EXAM: ULTRASOUND GUIDED RIGHT BREAST AND RIGHT AXILLA CORE NEEDLE BIOPSY ULTRASOUND GUIDED LEFT BREAST CORE NEEDLE BIOPSY COMPARISON:  Previous exam(s). PROCEDURE: I met with the patient and we discussed the procedure of ultrasound-guided biopsy, including benefits and alternatives.  We discussed the high likelihood of a successful procedure. We discussed the risks of the procedure, including infection, bleeding, tissue injury, clip migration, and inadequate sampling. Informed written consent was given. The usual time-out protocol was performed immediately prior to the procedure. RIGHT BREAST 10 O'CLOCK: MASS: Lesion quadrant: UPPER-OUTER Using sterile technique and 1% Lidocaine as local anesthetic, under direct ultrasound visualization, a 14 gauge spring-loaded device was used to perform biopsy of the mass in the right breast at  the 10 o'clock position using a lateral to medial approach. At the conclusion of the procedure a heart shaped tissue marker clip was deployed into the biopsy cavity. Follow up 2 view mammogram was performed and dictated separately. RIGHT AXILLA: LYMPH NODE: Lesion quadrant: UPPER-OUTER Using sterile technique and 1% Lidocaine as local anesthetic, under direct ultrasound visualization, a 14 gauge spring-loaded device was used to perform biopsy of the morphologically abnormal lymph node in the right axilla using a inferior to superior approach. At the conclusion of the procedure a HydroMARK tissue marker clip was deployed into the biopsy cavity. Follow up 2 view mammogram was performed and dictated separately. LEFT BREAST 2 O'CLOCK: MASS: Lesion quadrant: UPPER-OUTER Using sterile technique and 1% Lidocaine as local anesthetic, under direct ultrasound visualization, a 14 gauge spring-loaded device was used to perform biopsy of the mass in the left breast at the 2 o'clock position using a lateral to medial approach. At the conclusion of the procedure a venous shaped tissue marker clip was deployed into the biopsy cavity. Follow up 2 view mammogram was performed and dictated separately. IMPRESSION: 1. Ultrasound-guided biopsy of the mass in the right breast at the 10 o'clock position. 2.  Ultrasound-guided biopsy of the lymph node in the right axilla. 3. Ultrasound-guided  biopsy of the mass in the left breast at the 2 o'clock position. Electronically Signed: By: Everlean Alstrom M.D. 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 This was found to be concordant by Dr. Everlean Alstrom. 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 This was found to be concordant by Dr. Everlean Alstrom. Pathology revealed FIBROADENOMA of the LEFT breast, 2:00 3cmfn. This was found to be concordant by Dr. Everlean Alstrom. Pathology results were discussed with the patient by telephone. The patient reported doing well after the biopsies with tenderness at the sites. Post biopsy instructions and care were reviewed and questions were answered. The patient was encouraged to call The [facility] for any additional concerns. Al Pimple RN Nurse Navigator and BCCCP Coordinator has arranged appointment with Dr. Delight Hoh of [facility]. Pathology results reported by Stacie Acres RN on 11/13/2019. Electronically Signed   By: Everlean Alstrom M.D.   On: 11/13/2019 08:48   Result Date: 11/17/2019 CLINICAL DATA:  54 year old female with a suspicious mass in the right breast at the 10 o'clock position. Abnormal lymph node in the right axilla, and indeterminate mass in the left breast at the 2 o'clock position. EXAM: ULTRASOUND GUIDED RIGHT BREAST AND RIGHT AXILLA CORE NEEDLE BIOPSY ULTRASOUND GUIDED LEFT BREAST CORE NEEDLE BIOPSY COMPARISON:  Previous exam(s). PROCEDURE: I met with the patient and we discussed the procedure of ultrasound-guided biopsy, including benefits and alternatives. We discussed the high likelihood of a successful procedure. We discussed the risks of the procedure, including infection,  bleeding, tissue injury, clip migration, and inadequate sampling. Informed written consent was given. The usual time-out protocol was performed immediately prior to the procedure. RIGHT BREAST 10 O'CLOCK: MASS: Lesion quadrant: UPPER-OUTER Using sterile technique and 1% Lidocaine as local anesthetic, under direct ultrasound visualization, a 14 gauge spring-loaded device was used to perform biopsy of the mass in the right breast at the 10 o'clock position using a lateral to  medial approach. At the conclusion of the procedure a heart shaped tissue marker clip was deployed into the biopsy cavity. Follow up 2 view mammogram was performed and dictated separately. RIGHT AXILLA: LYMPH NODE: Lesion quadrant: UPPER-OUTER Using sterile technique and 1% Lidocaine as local anesthetic, under direct ultrasound visualization, a 14 gauge spring-loaded device was used to perform biopsy of the morphologically abnormal lymph node in the right axilla using a inferior to superior approach. At the conclusion of the procedure a HydroMARK tissue marker clip was deployed into the biopsy cavity. Follow up 2 view mammogram was performed and dictated separately. LEFT BREAST 2 O'CLOCK: MASS: Lesion quadrant: UPPER-OUTER Using sterile technique and 1% Lidocaine as local anesthetic, under direct ultrasound visualization, a 14 gauge spring-loaded device was used to perform biopsy of the mass in the left breast at the 2 o'clock position using a lateral to medial approach. At the conclusion of the procedure a venous shaped tissue marker clip was deployed into the biopsy cavity. Follow up 2 view mammogram was performed and dictated separately. IMPRESSION: 1. Ultrasound-guided biopsy of the mass in the right breast at the 10 o'clock position. 2.  Ultrasound-guided biopsy of the lymph node in the right axilla. 3. Ultrasound-guided biopsy of the mass in the left breast at the 2 o'clock position. Electronically Signed: By: Everlean Alstrom M.D. 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 This was found to be concordant by Dr. Everlean Alstrom. 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 This was found to be concordant by Dr. Everlean Alstrom. Pathology revealed FIBROADENOMA of the LEFT breast, 2:00 3cmfn. This was found to be concordant by Dr. Everlean Alstrom. Pathology results were discussed with the patient by telephone. The patient reported doing well after the biopsies with tenderness at the sites. Post biopsy instructions and care were reviewed and questions were answered. The patient was encouraged to call The [facility] for any additional concerns. Al Pimple RN Nurse Navigator and BCCCP Coordinator has arranged appointment with Dr. Delight Hoh of [facility]. Pathology results reported by Stacie Acres RN on 11/13/2019. Electronically Signed   By: Everlean Alstrom M.D.   On: 11/13/2019 08:48   Result Date: 11/17/2019 CLINICAL DATA:  54 year old female with a suspicious mass in the right breast at the 10 o'clock position. Abnormal lymph node in the right axilla, and indeterminate mass in the left breast at the 2 o'clock position. EXAM: ULTRASOUND GUIDED RIGHT BREAST AND RIGHT AXILLA CORE NEEDLE BIOPSY ULTRASOUND GUIDED LEFT BREAST CORE NEEDLE BIOPSY COMPARISON:  Previous exam(s). PROCEDURE: I met with the patient and we discussed the procedure of ultrasound-guided biopsy, including benefits and alternatives. We discussed the high likelihood of a successful procedure. We discussed the risks of the procedure, including infection, bleeding, tissue injury, clip migration, and inadequate sampling. Informed written consent was given. The usual  time-out protocol was performed immediately prior to the procedure. RIGHT BREAST 10 O'CLOCK: MASS: Lesion quadrant: UPPER-OUTER Using sterile technique and 1% Lidocaine as local anesthetic, under direct ultrasound visualization, a 14 gauge spring-loaded device was used to perform biopsy of the mass in the right breast at the 10 o'clock position using a lateral to medial approach. At the conclusion of the  procedure a heart shaped tissue marker clip was deployed into the biopsy cavity. Follow up 2 view mammogram was performed and dictated separately. RIGHT AXILLA: LYMPH NODE: Lesion quadrant: UPPER-OUTER Using sterile technique and 1% Lidocaine as local anesthetic, under direct ultrasound visualization, a 14 gauge spring-loaded device was used to perform biopsy of the morphologically abnormal lymph node in the right axilla using a inferior to superior approach. At the conclusion of the procedure a HydroMARK tissue marker clip was deployed into the biopsy cavity. Follow up 2 view mammogram was performed and dictated separately. LEFT BREAST 2 O'CLOCK: MASS: Lesion quadrant: UPPER-OUTER Using sterile technique and 1% Lidocaine as local anesthetic, under direct ultrasound visualization, a 14 gauge spring-loaded device was used to perform biopsy of the mass in the left breast at the 2 o'clock position using a lateral to medial approach. At the conclusion of the procedure a venous shaped tissue marker clip was deployed into the biopsy cavity. Follow up 2 view mammogram was performed and dictated separately. IMPRESSION: 1. Ultrasound-guided biopsy of the mass in the right breast at the 10 o'clock position. 2.  Ultrasound-guided biopsy of the lymph node in the right axilla. 3. Ultrasound-guided biopsy of the mass in the left breast at the 2 o'clock position. Electronically Signed: By: Everlean Alstrom M.D. 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  PLAN:    1. 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  Case also discussed with surgery.  Given the size and stage of patient's tumor, she will benefit from neoadjuvant chemotherapy with Adriamycin, Cytoxan and Udenyca support followed by weekly Taxol.  MUGA scan from November 26, 2019 revealed an EF of approximately 61%. CT scan of the chest, abdomen, and pelvis on December 01, 2019 reviewed independently and reported as above with no obvious evidence of metastatic disease.  Patient has now had port placement.  Proceed with cycle 1 of 4 of Adriamycin and Cytoxan today.  Return to clinic in 2 days for [facility] and then in 1 week for further evaluation and to assess her toleration of treatment.  Patient will then return to clinic in 2 weeks for further evaluation and consideration of cycle 2. 2.  History of DCIS: Status post lumpectomy and XRT.  Patient reports she did not take any adjuvant hormonal therapy.  Given this history, have referred patient to genetic counseling for evaluation and testing.  I spent a total of 30 minutes reviewing chart data, face-to-face evaluation with the patient, counseling and coordination of care as detailed above.   Patient expressed understanding and was in agreement with this plan. She also understands that She can call clinic at any time with any questions, concerns, or complaints.   Cancer Staging Primary cancer of upper outer quadrant of right female breast [facility]) Staging form: Breast, AJCC 8th Edition - Clinical stage from 11/26/2019: Stage IIIA (cT2, cN2a, cM0, G3, ER+, PR+, HER2-) - Signed by Lloyd Huger, MD on 11/26/2019   Lloyd Huger, MD   12/02/2019 4:06 PM

## 2019-11-30 ENCOUNTER — Encounter: Payer: Self-pay | Admitting: General Surgery

## 2019-11-30 ENCOUNTER — Ambulatory Visit: Payer: Medicaid Other

## 2019-11-30 ENCOUNTER — Ambulatory Visit: Payer: Medicaid Other | Admitting: Registered Nurse

## 2019-11-30 ENCOUNTER — Ambulatory Visit
Admission: RE | Admit: 2019-11-30 | Discharge: 2019-11-30 | Disposition: A | Discharge status: Home / Self Care | Payer: Medicaid Other | Attending: General Surgery | Admitting: General Surgery

## 2019-11-30 ENCOUNTER — Encounter
Admission: RE | Disposition: A | Discharge status: Home / Self Care | Payer: Self-pay | Source: Home / Self Care | Attending: General Surgery

## 2019-11-30 ENCOUNTER — Other Ambulatory Visit: Payer: Self-pay

## 2019-11-30 DIAGNOSIS — Z9851 Tubal ligation status: Secondary | ICD-10-CM | POA: Diagnosis not present

## 2019-11-30 DIAGNOSIS — Z8669 Personal history of other diseases of the nervous system and sense organs: Secondary | ICD-10-CM | POA: Insufficient documentation

## 2019-11-30 DIAGNOSIS — Z79899 Other long term (current) drug therapy: Secondary | ICD-10-CM | POA: Insufficient documentation

## 2019-11-30 DIAGNOSIS — Z888 Allergy status to other drugs, medicaments and biological substances status: Secondary | ICD-10-CM | POA: Insufficient documentation

## 2019-11-30 DIAGNOSIS — Z880 Allergy status to penicillin: Secondary | ICD-10-CM | POA: Diagnosis not present

## 2019-11-30 DIAGNOSIS — F17210 Nicotine dependence, cigarettes, uncomplicated: Secondary | ICD-10-CM | POA: Diagnosis not present

## 2019-11-30 DIAGNOSIS — D332 Benign neoplasm of brain, unspecified: Secondary | ICD-10-CM | POA: Insufficient documentation

## 2019-11-30 DIAGNOSIS — C50911 Malignant neoplasm of unspecified site of right female breast: Secondary | ICD-10-CM | POA: Insufficient documentation

## 2019-11-30 DIAGNOSIS — Z95828 Presence of other vascular implants and grafts: Secondary | ICD-10-CM

## 2019-11-30 DIAGNOSIS — I10 Essential (primary) hypertension: Secondary | ICD-10-CM | POA: Insufficient documentation

## 2019-11-30 DIAGNOSIS — J449 Chronic obstructive pulmonary disease, unspecified: Secondary | ICD-10-CM | POA: Diagnosis not present

## 2019-11-30 HISTORY — PX: PORTACATH PLACEMENT: SHX2246

## 2019-11-30 LAB — URINE DRUG SCREEN, QUALITATIVE (ARMC ONLY)
Amphetamines, Ur Screen: NOT DETECTED
Barbiturates, Ur Screen: NOT DETECTED
Benzodiazepine, Ur Scrn: NOT DETECTED
Cannabinoid 50 Ng, Ur ~~LOC~~: POSITIVE — AB
Cocaine Metabolite,Ur ~~LOC~~: NOT DETECTED
MDMA (Ecstasy)Ur Screen: NOT DETECTED
Methadone Scn, Ur: NOT DETECTED
Opiate, Ur Screen: NOT DETECTED
Phencyclidine (PCP) Ur S: NOT DETECTED
Tricyclic, Ur Screen: NOT DETECTED

## 2019-11-30 SURGERY — INSERTION, TUNNELED CENTRAL VENOUS DEVICE, WITH PORT
Anesthesia: General | Laterality: Left

## 2019-11-30 MED ORDER — MIDAZOLAM HCL 2 MG/2ML IJ SOLN
INTRAMUSCULAR | Status: DC | PRN
  Administered 2019-11-30: 2 mg via INTRAVENOUS

## 2019-11-30 MED ORDER — LIDOCAINE HCL (PF) 1 % IJ SOLN
INTRAMUSCULAR | Status: AC
  Filled 2019-11-30: qty 30

## 2019-11-30 MED ORDER — PROPOFOL 10 MG/ML IV BOLUS
INTRAVENOUS | Status: DC | PRN
  Administered 2019-11-30: 50 mg via INTRAVENOUS
  Administered 2019-11-30: 30 mg via INTRAVENOUS

## 2019-11-30 MED ORDER — FAMOTIDINE 20 MG PO TABS
20.0000 mg | ORAL_TABLET | Freq: Once | ORAL | Status: AC
  Administered 2019-11-30: 20 mg via ORAL

## 2019-11-30 MED ORDER — PROPOFOL 500 MG/50ML IV EMUL
INTRAVENOUS | Status: DC | PRN
  Administered 2019-11-30: 150 ug/kg/min via INTRAVENOUS

## 2019-11-30 MED ORDER — LACTATED RINGERS IV SOLN: INTRAVENOUS | Status: DC

## 2019-11-30 MED ORDER — GLYCOPYRROLATE 0.2 MG/ML IJ SOLN
INTRAMUSCULAR | Status: AC
  Filled 2019-11-30: qty 1

## 2019-11-30 MED ORDER — SODIUM CHLORIDE (PF) 0.9 % IJ SOLN
INTRAMUSCULAR | Status: AC
  Filled 2019-11-30: qty 50

## 2019-11-30 MED ORDER — MIDAZOLAM HCL 2 MG/2ML IJ SOLN
INTRAMUSCULAR | Status: AC
  Filled 2019-11-30: qty 2

## 2019-11-30 MED ORDER — LIDOCAINE HCL (PF) 1 % IJ SOLN
INTRAMUSCULAR | Status: DC | PRN
  Administered 2019-11-30: 10 mL

## 2019-11-30 MED ORDER — CEFAZOLIN SODIUM-DEXTROSE 2-4 GM/100ML-% IV SOLN
2.0000 g | INTRAVENOUS | Status: AC
  Administered 2019-11-30: 2 g via INTRAVENOUS

## 2019-11-30 MED ORDER — PROPOFOL 500 MG/50ML IV EMUL
INTRAVENOUS | Status: AC
  Filled 2019-11-30: qty 50

## 2019-11-30 MED ORDER — SEVOFLURANE IN SOLN
RESPIRATORY_TRACT | Status: AC
  Filled 2019-11-30: qty 250

## 2019-11-30 MED ORDER — FAMOTIDINE 20 MG PO TABS
ORAL_TABLET | ORAL | Status: AC
  Filled 2019-11-30: qty 1

## 2019-11-30 MED ORDER — CEFAZOLIN SODIUM-DEXTROSE 2-4 GM/100ML-% IV SOLN
INTRAVENOUS | Status: AC
  Filled 2019-11-30: qty 100

## 2019-11-30 MED ORDER — FENTANYL CITRATE (PF) 100 MCG/2ML IJ SOLN
INTRAMUSCULAR | Status: AC
  Filled 2019-11-30: qty 2

## 2019-11-30 MED ORDER — PHENYLEPHRINE HCL (PRESSORS) 10 MG/ML IV SOLN
INTRAVENOUS | Status: DC | PRN
  Administered 2019-11-30: 100 ug via INTRAVENOUS

## 2019-11-30 MED ORDER — PROMETHAZINE HCL 25 MG/ML IJ SOLN: 6.2500 mg | INTRAMUSCULAR | Status: DC | PRN

## 2019-11-30 MED ORDER — ONDANSETRON HCL 4 MG/2ML IJ SOLN
INTRAMUSCULAR | Status: DC | PRN
  Administered 2019-11-30: 4 mg via INTRAVENOUS

## 2019-11-30 MED ORDER — ONDANSETRON HCL 4 MG/2ML IJ SOLN
INTRAMUSCULAR | Status: AC
  Filled 2019-11-30: qty 2

## 2019-11-30 MED ORDER — LIDOCAINE HCL (PF) 2 % IJ SOLN
INTRAMUSCULAR | Status: AC
  Filled 2019-11-30: qty 5

## 2019-11-30 MED ORDER — FENTANYL CITRATE (PF) 100 MCG/2ML IJ SOLN
25.0000 ug | INTRAMUSCULAR | Status: DC | PRN
  Administered 2019-11-30 (×2): 25 ug via INTRAVENOUS

## 2019-11-30 MED ORDER — FENTANYL CITRATE (PF) 100 MCG/2ML IJ SOLN
INTRAMUSCULAR | Status: DC | PRN
  Administered 2019-11-30 (×4): 25 ug via INTRAVENOUS

## 2019-11-30 MED ORDER — GLYCOPYRROLATE 0.2 MG/ML IJ SOLN
INTRAMUSCULAR | Status: DC | PRN
  Administered 2019-11-30: .2 mg via INTRAVENOUS

## 2019-11-30 SURGICAL SUPPLY — 32 items
APL PRP STRL LF DISP 70% ISPRP (MISCELLANEOUS) ×1
BLADE SURG 15 STRL SS SAFETY (BLADE) ×2 IMPLANT
CHLORAPREP W/TINT 26 (MISCELLANEOUS) ×2 IMPLANT
COVER LIGHT HANDLE STERIS (MISCELLANEOUS) ×4 IMPLANT
COVER WAND RF STERILE (DRAPES) ×2 IMPLANT
DECANTER SPIKE VIAL GLASS SM (MISCELLANEOUS) ×4 IMPLANT
DRAPE C-ARM XRAY 36X54 (DRAPES) ×2 IMPLANT
DRAPE LAPAROTOMY TRNSV 106X77 (MISCELLANEOUS) ×2 IMPLANT
DRSG TEGADERM 2-3/8X2-3/4 SM (GAUZE/BANDAGES/DRESSINGS) ×2 IMPLANT
DRSG TEGADERM 4X4.75 (GAUZE/BANDAGES/DRESSINGS) ×2 IMPLANT
DRSG TELFA 4X3 1S NADH ST (GAUZE/BANDAGES/DRESSINGS) ×2 IMPLANT
ELECT REM PT RETURN 9FT ADLT (ELECTROSURGICAL) ×2
ELECTRODE REM PT RTRN 9FT ADLT (ELECTROSURGICAL) ×1 IMPLANT
GLOVE BIO SURGEON STRL SZ7.5 (GLOVE) ×2 IMPLANT
GLOVE INDICATOR 8.0 STRL GRN (GLOVE) ×2 IMPLANT
GOWN STRL REUS W/ TWL LRG LVL3 (GOWN DISPOSABLE) ×2 IMPLANT
GOWN STRL REUS W/TWL LRG LVL3 (GOWN DISPOSABLE) ×4
KIT PORT POWER 8FR ISP CVUE (Port) ×2 IMPLANT
KIT TURNOVER KIT A (KITS) ×2 IMPLANT
LABEL OR SOLS (LABEL) ×2 IMPLANT
MICROPUNCTURE 5FR NT-U-SST (MISCELLANEOUS) ×2
NS IRRIG 500ML POUR BTL (IV SOLUTION) ×2 IMPLANT
PACK PORT-A-CATH (MISCELLANEOUS) ×2 IMPLANT
SET MICROPUNCTURE 5FR NT-U-SST (MISCELLANEOUS) IMPLANT
STRIP CLOSURE SKIN 1/2X4 (GAUZE/BANDAGES/DRESSINGS) ×2 IMPLANT
SUT PROLENE 3 0 SH DA (SUTURE) ×2 IMPLANT
SUT VIC AB 3-0 SH 27 (SUTURE) ×2
SUT VIC AB 3-0 SH 27X BRD (SUTURE) ×1 IMPLANT
SUT VIC AB 4-0 FS2 27 (SUTURE) ×2 IMPLANT
SWABSTK COMLB BENZOIN TINCTURE (MISCELLANEOUS) ×2 IMPLANT
SYR 10ML LL (SYRINGE) ×2 IMPLANT
SYR 10ML SLIP (SYRINGE) ×2 IMPLANT

## 2019-11-30 NOTE — H&P (Signed)
Kelsey Russo SM:[id number] Mar 19, 1966     HPI:  Recently diagnosed stage II breast cancer with need for central venous access. For power port placement.   Medications Prior to Admission  Medication Sig Dispense Refill Last Dose  . acetaminophen (TYLENOL) 500 MG tablet Take 500-1,000 mg by mouth every 6 (six) hours as needed (for pain.).   11/29/2019 at Unknown time  . albuterol (VENTOLIN HFA) 108 (90 Base) MCG/ACT inhaler Inhale 1-2 puffs into the lungs every 6 (six) hours as needed for wheezing or shortness of breath.   11/30/2019 at Unknown time  . amLODipine (NORVASC) 10 MG tablet Take 10 mg by mouth daily.    11/29/2019 at Unknown time  . lidocaine-prilocaine (EMLA) cream Apply to affected area once 30 g 3 Past Month at Unknown time  . Melatonin 5 MG TABS Take 1 tablet by mouth at bedtime and may repeat dose one time if needed.   Past Week at Unknown time  . ondansetron (ZOFRAN) 8 MG tablet Take 1 tablet (8 mg total) by mouth 2 (two) times daily as needed. 60 tablet 2 Past Month at Unknown time  . prochlorperazine (COMPAZINE) 10 MG tablet Take 1 tablet (10 mg total) by mouth every 6 (six) hours as needed (Nausea or vomiting). 60 tablet 2 Past Month at Unknown time  . Pseudoeph-Doxylamine-DM-APAP (NYQUIL PO) Take 1-2 capsules by mouth at bedtime as needed (sleep.).    Past Month at Unknown time  . tetrahydrozoline 0.05 % ophthalmic solution Place 1-2 drops into both eyes 3 (three) times daily as needed (dry/irritated eyes).   Past Month at Unknown time   Allergies  Allergen Reactions  . Imitrex [Sumatriptan] Anaphylaxis  . Penicillins Rash    Did it involve swelling of the face/tongue/throat, SOB, or low BP? Unknown Did it involve sudden or severe rash/hives, skin peeling, or any reaction on the inside of your mouth or nose? Unknown Did you need to seek medical attention at a hospital or doctor's office? Unknown When did it last happen?? If all above answers are "NO", may proceed with  cephalosporin use.    Past Medical History:  Diagnosis Date  . Brain tumor (benign) ([place])   . Breast cancer ([place]) 1999   Status post right breast lumpectomy and radiation  . COPD (chronic obstructive pulmonary disease) ([place])   . Depression   . History of chemotherapy 1999  . History of seizure disorder   . Hypertension   . Personal history of radiation therapy 1999  . Pneumonia    Past Surgical History:  Procedure Laterality Date  . BRAIN SURGERY    . Brain tumor resection  1991  . BREAST BIOPSY Right 1999   Positive  . BREAST BIOPSY Right 11/10/2019   [place] bx 10:00 heart marker, path pending  . BREAST BIOPSY Right 11/10/2019   [place] bx axilla, hydromark 4, path pending  . BREAST BIOPSY Left 11/10/2019   [place] bx 2:00, venus marker, path pending  . Right breast lumpectomy    . TONSILLECTOMY    . TUBAL LIGATION     Social History   Socioeconomic History  . Marital status: Married    Spouse name: Not on file  . Number of children: Not on file  . Years of education: Not on file  . Highest education level: Not on file  Occupational History  . Not on file  Tobacco Use  . Smoking status: Current Every Day Smoker    Packs/day: 0.50    Types:  Cigarettes  . Smokeless tobacco: Never Used  Substance and Sexual Activity  . Alcohol use: No    Alcohol/week: 0.0 standard drinks  . Drug use: No    Comment: Marijuana a year ago 2020  . Sexual activity: Not on file  Other Topics Concern  . Not on file  Social History Narrative   Lives at home with husband.   Social Determinants of Health   Financial Resource Strain:   . Difficulty of Paying Living Expenses:   Food Insecurity:   . Worried About Charity fundraiser in the Last Year:   . Arboriculturist in the Last Year:   Transportation Needs:   . Film/video editor (Medical):   Marland Kitchen Lack of Transportation (Non-Medical):   Physical Activity:   . Days of Exercise per Week:   . Minutes of Exercise per Session:   Stress:   .  Feeling of Stress :   Social Connections:   . Frequency of Communication with Friends and Family:   . Frequency of Social Gatherings with Friends and Family:   . Attends Religious Services:   . Active Member of Clubs or Organizations:   . Attends Archivist Meetings:   Marland Kitchen Marital Status:   Intimate Partner Violence:   . Fear of Current or Ex-Partner:   . Emotionally Abused:   Marland Kitchen Physically Abused:   . Sexually Abused:    Social History   Social History Narrative   Lives at home with husband.     ROS: Negative.     PE: HEENT: Negative. Lungs: Clear. Cardio: RRForest Russo Kelsey Russo 11/30/2019   Assessment/Plan:  Proceed with planned left port placement.

## 2019-11-30 NOTE — Op Note (Signed)
Preoperative diagnosis: Stage II carcinoma of the right breast.,  Need for central venous access.  Postoperative diagnosis: Same.  Operative procedure: Left subclavian PowerPort placement with ultrasound and fluoroscopic guidance.  Operating surgeon: Hervey Ard, MD.  Anesthesia: Attended local, 10 cc 1% Xylocaine plain.  Estimated blood loss: 2 cc.  Clinical note: This 54 year old woman was noted with advanced breast cancer and is a candidate for neoadjuvant chemotherapy.  Central venous access was requested by her treating oncologist.  Operative note: The patient received Ancef prior to the procedure.  The left neck and chest was cleansed with ChloraPrep and draped.  In Trendelenburg position ultrasound confirmed patency of the left subclavian vein.  This was cannulated under direct ultrasound guidance.  The guidewire was passed but did not track down the SVC.  The guidewire was removed and the vein reaccessed.  This time the guidewire advanced down the SVC as expected.  The dilator was placed followed by the catheter.  This was positioned just at the junction of the SVC and right atrium.  The catheter was tunneled to a pocket on the left chest.  The port was anchored to the deep tissue with interrupted 3-0 Prolene sutures x2.  The adipose layer was closed with a running 3-0 Vicryl suture.  The skin closed with a running 4-0 Vicryl subcuticular suture.  The port was flushed with 10 cc of injectable saline.  Benzoin, Steri-Strips, Telfa and Tegaderm dressing applied.  The patient was brought to the recovery in stable condition where an erect portable chest x-ray was completed.  This showed the catheter tip as described above and no evidence of pneumothorax.

## 2019-11-30 NOTE — Transfer of Care (Signed)
Immediate Anesthesia Transfer of Care Note  Patient: Kelsey Russo  Procedure(s) Performed: INSERTION PORT-A-CATH (Left )  Patient Location: PACU  Anesthesia Type:General  Level of Consciousness: drowsy  Airway & Oxygen Therapy: Patient Spontanous Breathing  Post-op Assessment: Report given to RN and Post -op Vital signs reviewed and stable  Post vital signs: Reviewed and stable  Last Vitals:  Vitals Value Taken Time  BP 105/82 11/30/19 0952  Temp 36 C 11/30/19 0952  Pulse 90 11/30/19 0953  Resp 16 11/30/19 0953  SpO2 99 % 11/30/19 0953  Vitals shown include unvalidated device data.  Last Pain:  Vitals:   11/30/19 0952  TempSrc: Temporal  PainSc:          Complications: No apparent anesthesia complications

## 2019-11-30 NOTE — Discharge Instructions (Signed)

## 2019-11-30 NOTE — Anesthesia Postprocedure Evaluation (Signed)
Anesthesia Post Note  Patient: Kelsey Russo  Procedure(s) Performed: INSERTION PORT-A-CATH (Left )  Patient location during evaluation: PACU Anesthesia Type: General Level of consciousness: awake and alert Pain management: pain level controlled Vital Signs Assessment: post-procedure vital signs reviewed and stable Respiratory status: spontaneous breathing, nonlabored ventilation and respiratory function stable Cardiovascular status: blood pressure returned to baseline and stable Postop Assessment: no apparent nausea or vomiting Anesthetic complications: no     Last Vitals:  Vitals:   11/30/19 1033 11/30/19 1047  BP: (!) 130/93 117/77  Pulse: 82 80  Resp: 16 16  Temp: 37 C   SpO2: 95% 100%    Last Pain:  Vitals:   11/30/19 1047  TempSrc:   PainSc: [place]

## 2019-11-30 NOTE — Anesthesia Preprocedure Evaluation (Addendum)
Anesthesia Evaluation  Patient identified by MRN, date of birth, ID band Patient awake    Reviewed: Allergy & Precautions, H&P , NPO status , Patient's Chart, lab work & pertinent test results  Airway Mallampati: II  TM Distance: >3 FB Neck ROM: full    Dental  (+) Chipped, Poor Dentition Extremely poor dentition.  States that teeth chip easily:   Pulmonary COPD,  COPD inhaler, Current Smoker,     + decreased breath sounds      Cardiovascular hypertension, (-) angina(-) Past MI and (-) Cardiac Stents (-) dysrhythmias  Rhythm:regular Rate:Normal     Neuro/Psych PSYCHIATRIC DISORDERS Depression negative neurological ROS     GI/Hepatic negative GI ROS, (+)     substance abuse  marijuana use,   Endo/Other  negative endocrine ROS  Renal/GU negative Renal ROS  negative genitourinary   Musculoskeletal   Abdominal   Peds  Hematology negative hematology ROS (+)   Anesthesia Other Findings Past Medical History: No date: Brain tumor (benign) ([place]) 1999: Breast cancer ([place])     Comment:  Status post right breast lumpectomy and radiation No date: COPD (chronic obstructive pulmonary disease) ([place]) No date: Depression 1999: History of chemotherapy No date: History of seizure disorder No date: Hypertension 1999: Personal history of radiation therapy No date: Pneumonia  Past Surgical History: No date: BRAIN SURGERY 1991: Brain tumor resection 1999: BREAST BIOPSY; Right     Comment:  Positive 11/10/2019: BREAST BIOPSY; Right     Comment:  [place] bx 10:00 heart marker, path pending 11/10/2019: BREAST BIOPSY; Right     Comment:  [place] bx axilla, hydromark 4, path pending 11/10/2019: BREAST BIOPSY; Left     Comment:  [place] bx 2:00, venus marker, path pending No date: Right breast lumpectomy No date: TONSILLECTOMY No date: TUBAL LIGATION  BMI    Body Mass Index: 26.55 kg/m      Reproductive/Obstetrics negative OB ROS                            Anesthesia Physical Anesthesia Plan  ASA: II  Anesthesia Plan: General   Post-op Pain Management:    Induction:   PONV Risk Score and Plan: Propofol infusion and TIVA  Airway Management Planned: Natural Airway and Simple Face Mask  Additional Equipment:   Intra-op Plan:   Post-operative Plan:   Informed Consent: I have reviewed the patients History and Physical, chart, labs and discussed the procedure including the risks, benefits and alternatives for the proposed anesthesia with the patient or authorized representative who has indicated his/her understanding and acceptance.     Dental Advisory Given  Plan Discussed with: Anesthesiologist  Anesthesia Plan Comments:         Anesthesia Quick Evaluation

## 2019-11-30 NOTE — Anesthesia Procedure Notes (Signed)
Date/Time: 11/30/2019 9:09 AM Performed by: Doreen Salvage, CRNA Pre-anesthesia Checklist: Patient identified, Emergency Drugs available, Suction available and Patient being monitored Patient Re-evaluated:Patient Re-evaluated prior to induction Oxygen Delivery Method: Simple face mask Induction Type: IV induction Dental Injury: Teeth and Oropharynx as per pre-operative assessment

## 2019-12-01 ENCOUNTER — Other Ambulatory Visit: Payer: Self-pay

## 2019-12-01 ENCOUNTER — Ambulatory Visit
Admission: RE | Admit: 2019-12-01 | Discharge: 2019-12-01 | Disposition: A | Discharge status: Home / Self Care | Payer: Medicaid Other | Source: Ambulatory Visit | Attending: Oncology | Admitting: Oncology

## 2019-12-01 DIAGNOSIS — C50919 Malignant neoplasm of unspecified site of unspecified female breast: Secondary | ICD-10-CM | POA: Insufficient documentation

## 2019-12-01 MED ORDER — IOHEXOL 300 MG/ML  SOLN
100.0000 mL | Freq: Once | INTRAMUSCULAR | Status: AC | PRN
  Administered 2019-12-01: 100 mL via INTRAVENOUS

## 2019-12-01 NOTE — Patient Instructions (Signed)
Doxorubicin injection What is this medicine? DOXORUBICIN (dox oh ROO bi sin) is a chemotherapy drug. It is used to treat many kinds of cancer like leukemia, lymphoma, neuroblastoma, sarcoma, and Wilms' tumor. It is also used to treat bladder cancer, breast cancer, lung cancer, ovarian cancer, stomach cancer, and thyroid cancer. This medicine may be used for other purposes; ask your health care provider or pharmacist if you have questions. COMMON BRAND NAME(S): Adriamycin, Adriamycin PFS, Adriamycin RDF, Rubex What should I tell my health care provider before I take this medicine? They need to know if you have any of these conditions:  heart disease  history of low blood counts caused by a medicine  liver disease  recent or ongoing radiation therapy  an unusual or allergic reaction to doxorubicin, other chemotherapy agents, other medicines, foods, dyes, or preservatives  pregnant or trying to get pregnant  breast-feeding How should I use this medicine? This drug is given as an infusion into a vein. It is administered in a hospital or clinic by a specially trained health care professional. If you have pain, swelling, burning or any unusual feeling around the site of your injection, tell your health care professional right away. Talk to your pediatrician regarding the use of this medicine in children. Special care may be needed. Overdosage: If you think you have taken too much of this medicine contact a poison control center or emergency room at once. NOTE: This medicine is only for you. Do not share this medicine with others. What if I miss a dose? It is important not to miss your dose. Call your doctor or health care professional if you are unable to keep an appointment. What may interact with this medicine? This medicine may interact with the following medications:  6-mercaptopurine  paclitaxel  phenytoin  St. John's Wort  trastuzumab  verapamil This list may not describe  all possible interactions. Give your health care provider a list of all the medicines, herbs, non-prescription drugs, or dietary supplements you use. Also tell them if you smoke, drink alcohol, or use illegal drugs. Some items may interact with your medicine. What should I watch for while using this medicine? This drug may make you feel generally unwell. This is not uncommon, as chemotherapy can affect healthy cells as well as cancer cells. Report any side effects. Continue your course of treatment even though you feel ill unless your doctor tells you to stop. There is a maximum amount of this medicine you should receive throughout your life. The amount depends on the medical condition being treated and your overall health. Your doctor will watch how much of this medicine you receive in your lifetime. Tell your doctor if you have taken this medicine before. You may need blood work done while you are taking this medicine. Your urine may turn red for a few days after your dose. This is not blood. If your urine is dark or brown, call your doctor. In some cases, you may be given additional medicines to help with side effects. Follow all directions for their use. Call your doctor or health care professional for advice if you get a fever, chills or sore throat, or other symptoms of a cold or flu. Do not treat yourself. This drug decreases your body's ability to fight infections. Try to avoid being around people who are sick. This medicine may increase your risk to bruise or bleed. Call your doctor or health care professional if you notice any unusual bleeding. Talk to your doctor   about your risk of cancer. You may be more at risk for certain types of cancers if you take this medicine. Do not become pregnant while taking this medicine or for 6 months after stopping it. Women should inform their doctor if they wish to become pregnant or think they might be pregnant. Men should not father a child while taking this  medicine and for 6 months after stopping it. There is a potential for serious side effects to an unborn child. Talk to your health care professional or pharmacist for more information. Do not breast-feed an infant while taking this medicine. This medicine has caused ovarian failure in some women and reduced sperm counts in some men This medicine may interfere with the ability to have a child. Talk with your doctor or health care professional if you are concerned about your fertility. This medicine may cause a decrease in Co-Enzyme Q-10. You should make sure that you get enough Co-Enzyme Q-10 while you are taking this medicine. Discuss the foods you eat and the vitamins you take with your health care professional. What side effects may I notice from receiving this medicine? Side effects that you should report to your doctor or health care professional as soon as possible:  allergic reactions like skin rash, itching or hives, swelling of the face, lips, or tongue  breathing problems  chest pain  fast or irregular heartbeat  low blood counts - this medicine may decrease the number of white blood cells, red blood cells and platelets. You may be at increased risk for infections and bleeding.  pain, redness, or irritation at site where injected  signs of infection - fever or chills, cough, sore throat, pain or difficulty passing urine  signs of decreased platelets or bleeding - bruising, pinpoint red spots on the skin, black, tarry stools, blood in the urine  swelling of the ankles, feet, hands  tiredness  weakness Side effects that usually do not require medical attention (report to your doctor or health care professional if they continue or are bothersome):  diarrhea  hair loss  mouth sores  nail discoloration or damage  nausea  red colored urine  vomiting This list may not describe all possible side effects. Call your doctor for medical advice about side effects. You may report  side effects to FDA at 1-800-FDA-1088. Where should I keep my medicine? This drug is given in a hospital or clinic and will not be stored at home. NOTE: This sheet is a summary. It may not cover all possible information. If you have questions about this medicine, talk to your doctor, pharmacist, or health care provider.  2020 Elsevier/Gold Standard (2017-04-03 11:01:26) Cyclophosphamide Injection What is this medicine? CYCLOPHOSPHAMIDE (sye kloe FOSS fa mide) is a chemotherapy drug. It slows the growth of cancer cells. This medicine is used to treat many types of cancer like lymphoma, myeloma, leukemia, breast cancer, and ovarian cancer, to name a few. This medicine may be used for other purposes; ask your health care provider or pharmacist if you have questions. COMMON BRAND NAME(S): Cytoxan, Neosar What should I tell my health care provider before I take this medicine? They need to know if you have any of these conditions:  heart disease  history of irregular heartbeat  infection  kidney disease  liver disease  low blood counts, like white cells, platelets, or red blood cells  on hemodialysis  recent or ongoing radiation therapy  scarring or thickening of the lungs  trouble passing urine  an   unusual or allergic reaction to cyclophosphamide, other medicines, foods, dyes, or preservatives  pregnant or trying to get pregnant  breast-feeding How should I use this medicine? This drug is usually given as an injection into a vein or muscle or by infusion into a vein. It is administered in a hospital or clinic by a specially trained health care professional. Talk to your pediatrician regarding the use of this medicine in children. Special care may be needed. Overdosage: If you think you have taken too much of this medicine contact a poison control center or emergency room at once. NOTE: This medicine is only for you. Do not share this medicine with others. What if I miss a  dose? It is important not to miss your dose. Call your doctor or health care professional if you are unable to keep an appointment. What may interact with this medicine?  amphotericin B  azathioprine  certain antivirals for HIV or hepatitis  certain medicines for blood pressure, heart disease, irregular heart beat  certain medicines that treat or prevent blood clots like warfarin  certain other medicines for cancer  cyclosporine  etanercept  indomethacin  medicines that relax muscles for surgery  medicines to increase blood counts  metronidazole This list may not describe all possible interactions. Give your health care provider a list of all the medicines, herbs, non-prescription drugs, or dietary supplements you use. Also tell them if you smoke, drink alcohol, or use illegal drugs. Some items may interact with your medicine. What should I watch for while using this medicine? Your condition will be monitored carefully while you are receiving this medicine. You may need blood work done while you are taking this medicine. Drink water or other fluids as directed. Urinate often, even at night. Some products may contain alcohol. Ask your health care professional if this medicine contains alcohol. Be sure to tell all health care professionals you are taking this medicine. Certain medicines, like metronidazole and disulfiram, can cause an unpleasant reaction when taken with alcohol. The reaction includes flushing, headache, nausea, vomiting, sweating, and increased thirst. The reaction can last from 30 minutes to several hours. Do not become pregnant while taking this medicine or for 1 year after stopping it. Women should inform their health care professional if they wish to become pregnant or think they might be pregnant. Men should not father a child while taking this medicine and for 4 months after stopping it. There is potential for serious side effects to an unborn child. Talk to your  health care professional for more information. Do not breast-feed an infant while taking this medicine or for 1 week after stopping it. This medicine has caused ovarian failure in some women. This medicine may make it more difficult to get pregnant. Talk to your health care professional if you are concerned about your fertility. This medicine has caused decreased sperm counts in some men. This may make it more difficult to father a child. Talk to your health care professional if you are concerned about your fertility. Call your health care professional for advice if you get a fever, chills, or sore throat, or other symptoms of a cold or flu. Do not treat yourself. This medicine decreases your body's ability to fight infections. Try to avoid being around people who are sick. Avoid taking medicines that contain aspirin, acetaminophen, ibuprofen, naproxen, or ketoprofen unless instructed by your health care professional. These medicines may hide a fever. Talk to your health care professional about your risk of cancer.   You may be more at risk for certain types of cancer if you take this medicine. If you are going to need surgery or other procedure, tell your health care professional that you are using this medicine. Be careful brushing or flossing your teeth or using a toothpick because you may get an infection or bleed more easily. If you have any dental work done, tell your dentist you are receiving this medicine. What side effects may I notice from receiving this medicine? Side effects that you should report to your doctor or health care professional as soon as possible:  allergic reactions like skin rash, itching or hives, swelling of the face, lips, or tongue  breathing problems  nausea, vomiting  signs and symptoms of bleeding such as bloody or black, tarry stools; red or dark brown urine; spitting up blood or brown material that looks like coffee grounds; red spots on the skin; unusual bruising  or bleeding from the eyes, gums, or nose  signs and symptoms of heart failure like fast, irregular heartbeat, sudden weight gain; swelling of the ankles, feet, hands  signs and symptoms of infection like fever; chills; cough; sore throat; pain or trouble passing urine  signs and symptoms of kidney injury like trouble passing urine or change in the amount of urine  signs and symptoms of liver injury like dark yellow or brown urine; general ill feeling or flu-like symptoms; light-colored stools; loss of appetite; nausea; right upper belly pain; unusually weak or tired; yellowing of the eyes or skin Side effects that usually do not require medical attention (report to your doctor or health care professional if they continue or are bothersome):  confusion  decreased hearing  diarrhea  facial flushing  hair loss  headache  loss of appetite  missed menstrual periods  signs and symptoms of low red blood cells or anemia such as unusually weak or tired; feeling faint or lightheaded; falls  skin discoloration This list may not describe all possible side effects. Call your doctor for medical advice about side effects. You may report side effects to FDA at 1-800-FDA-1088. Where should I keep my medicine? This drug is given in a hospital or clinic and will not be stored at home. NOTE: This sheet is a summary. It may not cover all possible information. If you have questions about this medicine, talk to your doctor, pharmacist, or health care provider.  2020 Elsevier/Gold Standard (2019-05-25 09:53:29) Paclitaxel injection What is this medicine? PACLITAXEL (PAK li TAX el) is a chemotherapy drug. It targets fast dividing cells, like cancer cells, and causes these cells to die. This medicine is used to treat ovarian cancer, breast cancer, lung cancer, Kaposi's sarcoma, and other cancers. This medicine may be used for other purposes; ask your health care provider or pharmacist if you have  questions. COMMON BRAND NAME(S): Onxol, Taxol What should I tell my health care provider before I take this medicine? They need to know if you have any of these conditions:  history of irregular heartbeat  liver disease  low blood counts, like low white cell, platelet, or red cell counts  lung or breathing disease, like asthma  tingling of the fingers or toes, or other nerve disorder  an unusual or allergic reaction to paclitaxel, alcohol, polyoxyethylated castor oil, other chemotherapy, other medicines, foods, dyes, or preservatives  pregnant or trying to get pregnant  breast-feeding How should I use this medicine? This drug is given as an infusion into a vein. It is administered in a   hospital or clinic by a specially trained health care professional. Talk to your pediatrician regarding the use of this medicine in children. Special care may be needed. Overdosage: If you think you have taken too much of this medicine contact a poison control center or emergency room at once. NOTE: This medicine is only for you. Do not share this medicine with others. What if I miss a dose? It is important not to miss your dose. Call your doctor or health care professional if you are unable to keep an appointment. What may interact with this medicine? Do not take this medicine with any of the following medications:  disulfiram  metronidazole This medicine may also interact with the following medications:  antiviral medicines for hepatitis, HIV or AIDS  certain antibiotics like erythromycin and clarithromycin  certain medicines for fungal infections like ketoconazole and itraconazole  certain medicines for seizures like carbamazepine, phenobarbital, phenytoin  gemfibrozil  nefazodone  rifampin  St. John's wort This list may not describe all possible interactions. Give your health care provider a list of all the medicines, herbs, non-prescription drugs, or dietary supplements you use.  Also tell them if you smoke, drink alcohol, or use illegal drugs. Some items may interact with your medicine. What should I watch for while using this medicine? Your condition will be monitored carefully while you are receiving this medicine. You will need important blood work done while you are taking this medicine. This medicine can cause serious allergic reactions. To reduce your risk you will need to take other medicine(s) before treatment with this medicine. If you experience allergic reactions like skin rash, itching or hives, swelling of the face, lips, or tongue, tell your doctor or health care professional right away. In some cases, you may be given additional medicines to help with side effects. Follow all directions for their use. This drug may make you feel generally unwell. This is not uncommon, as chemotherapy can affect healthy cells as well as cancer cells. Report any side effects. Continue your course of treatment even though you feel ill unless your doctor tells you to stop. Call your doctor or health care professional for advice if you get a fever, chills or sore throat, or other symptoms of a cold or flu. Do not treat yourself. This drug decreases your body's ability to fight infections. Try to avoid being around people who are sick. This medicine may increase your risk to bruise or bleed. Call your doctor or health care professional if you notice any unusual bleeding. Be careful brushing and flossing your teeth or using a toothpick because you may get an infection or bleed more easily. If you have any dental work done, tell your dentist you are receiving this medicine. Avoid taking products that contain aspirin, acetaminophen, ibuprofen, naproxen, or ketoprofen unless instructed by your doctor. These medicines may hide a fever. Do not become pregnant while taking this medicine. Women should inform their doctor if they wish to become pregnant or think they might be pregnant. There is a  potential for serious side effects to an unborn child. Talk to your health care professional or pharmacist for more information. Do not breast-feed an infant while taking this medicine. Men are advised not to father a child while receiving this medicine. This product may contain alcohol. Ask your pharmacist or healthcare provider if this medicine contains alcohol. Be sure to tell all healthcare providers you are taking this medicine. Certain medicines, like metronidazole and disulfiram, can cause an unpleasant reaction when   taken with alcohol. The reaction includes flushing, headache, nausea, vomiting, sweating, and increased thirst. The reaction can last from 30 minutes to several hours. What side effects may I notice from receiving this medicine? Side effects that you should report to your doctor or health care professional as soon as possible:  allergic reactions like skin rash, itching or hives, swelling of the face, lips, or tongue  breathing problems  changes in vision  fast, irregular heartbeat  high or low blood pressure  mouth sores  pain, tingling, numbness in the hands or feet  signs of decreased platelets or bleeding - bruising, pinpoint red spots on the skin, black, tarry stools, blood in the urine  signs of decreased red blood cells - unusually weak or tired, feeling faint or lightheaded, falls  signs of infection - fever or chills, cough, sore throat, pain or difficulty passing urine  signs and symptoms of liver injury like dark yellow or brown urine; general ill feeling or flu-like symptoms; light-colored stools; loss of appetite; nausea; right upper belly pain; unusually weak or tired; yellowing of the eyes or skin  swelling of the ankles, feet, hands  unusually slow heartbeat Side effects that usually do not require medical attention (report to your doctor or health care professional if they continue or are bothersome):  diarrhea  hair loss  loss of  appetite  muscle or joint pain  nausea, vomiting  pain, redness, or irritation at site where injected  tiredness This list may not describe all possible side effects. Call your doctor for medical advice about side effects. You may report side effects to FDA at 1-800-FDA-1088. Where should I keep my medicine? This drug is given in a hospital or clinic and will not be stored at home. NOTE: This sheet is a summary. It may not cover all possible information. If you have questions about this medicine, talk to your doctor, pharmacist, or health care provider.  2020 Elsevier/Gold Standard (2017-04-23 13:14:55)  

## 2019-12-02 ENCOUNTER — Inpatient Hospital Stay: Payer: Medicaid Other

## 2019-12-02 ENCOUNTER — Inpatient Hospital Stay: Payer: Medicaid Other | Admitting: Oncology

## 2019-12-02 ENCOUNTER — Inpatient Hospital Stay (HOSPITAL_BASED_OUTPATIENT_CLINIC_OR_DEPARTMENT_OTHER): Payer: Medicaid Other | Admitting: Oncology

## 2019-12-02 ENCOUNTER — Other Ambulatory Visit: Payer: Medicaid Other

## 2019-12-02 VITALS — BP 125/85 | HR 82 | Resp 18 | Wt 169.2 lb

## 2019-12-02 DIAGNOSIS — C50411 Malignant neoplasm of upper-outer quadrant of right female breast: Secondary | ICD-10-CM

## 2019-12-02 DIAGNOSIS — Z5111 Encounter for antineoplastic chemotherapy: Secondary | ICD-10-CM | POA: Diagnosis not present

## 2019-12-02 LAB — COMPREHENSIVE METABOLIC PANEL
ALT: 15 U/L (ref 0–44)
AST: 17 U/L (ref 15–41)
Albumin: 4.5 g/dL (ref 3.5–5.0)
Alkaline Phosphatase: 120 U/L (ref 38–126)
Anion gap: 10 (ref 5–15)
BUN: 16 mg/dL (ref 6–20)
CO2: 22 mmol/L (ref 22–32)
Calcium: 9.1 mg/dL (ref 8.9–10.3)
Chloride: 103 mmol/L (ref 98–111)
Creatinine, Ser: 0.73 mg/dL (ref 0.44–1.00)
GFR calc Af Amer: >60 mL/min (ref >60)
GFR calc non Af Amer: >60 mL/min (ref >60)
Glucose, Bld: 95 mg/dL (ref 70–99)
Potassium: 3.7 mmol/L (ref 3.5–5.1)
Sodium: 135 mmol/L (ref 135–145)
Total Bilirubin: 0.3 mg/dL (ref 0.3–1.2)
Total Protein: 8.1 g/dL (ref 6.5–8.1)

## 2019-12-02 LAB — CBC WITH DIFFERENTIAL/PLATELET
Abs Immature Granulocytes: 0.04 10*3/uL (ref 0.00–0.07)
Basophils Absolute: 0.1 10*3/uL (ref 0.0–0.1)
Basophils Relative: 1 %
Eosinophils Absolute: 0.1 10*3/uL (ref 0.0–0.5)
Eosinophils Relative: 1 %
HCT: 42.3 % (ref 36.0–46.0)
Hemoglobin: 14.5 g/dL (ref 12.0–15.0)
Immature Granulocytes: 0 %
Lymphocytes Relative: 18 %
Lymphs Abs: 2.1 10*3/uL (ref 0.7–4.0)
MCH: 30.9 pg (ref 26.0–34.0)
MCHC: 34.3 g/dL (ref 30.0–36.0)
MCV: 90 fL (ref 80.0–100.0)
Monocytes Absolute: 0.8 10*3/uL (ref 0.1–1.0)
Monocytes Relative: 7 %
Neutro Abs: 8.6 10*3/uL — ABNORMAL HIGH (ref 1.7–7.7)
Neutrophils Relative %: 73 %
Platelets: 270 10*3/uL (ref 150–400)
RBC: 4.7 MIL/uL (ref 3.87–5.11)
RDW: 12.2 % (ref 11.5–15.5)
WBC: 11.6 10*3/uL — ABNORMAL HIGH (ref 4.0–10.5)
nRBC: 0 % (ref 0.0–0.2)

## 2019-12-02 MED ORDER — SODIUM CHLORIDE 0.9 % IV SOLN
10.0000 mg | Freq: Once | INTRAVENOUS | Status: AC
  Administered 2019-12-02: 13:00:00 10 mg via INTRAVENOUS
  Filled 2019-12-02: qty 10

## 2019-12-02 MED ORDER — HEPARIN SOD (PORK) LOCK FLUSH 100 UNIT/ML IV SOLN
500.0000 [IU] | Freq: Once | INTRAVENOUS | Status: AC | PRN
  Administered 2019-12-02: 500 [IU]
  Filled 2019-12-02: qty 5

## 2019-12-02 MED ORDER — HEPARIN SOD (PORK) LOCK FLUSH 100 UNIT/ML IV SOLN
INTRAVENOUS | Status: AC
  Filled 2019-12-02: qty 5

## 2019-12-02 MED ORDER — SODIUM CHLORIDE 0.9% FLUSH
10.0000 mL | Freq: Once | INTRAVENOUS | Status: AC
  Administered 2019-12-02: 10 mL via INTRAVENOUS
  Filled 2019-12-02: qty 10

## 2019-12-02 MED ORDER — DOXORUBICIN HCL CHEMO IV INJECTION 2 MG/ML
60.0000 mg/m2 | Freq: Once | INTRAVENOUS | Status: AC
  Administered 2019-12-02: 114 mg via INTRAVENOUS
  Filled 2019-12-02: qty 50

## 2019-12-02 MED ORDER — PALONOSETRON HCL INJECTION 0.25 MG/5ML
0.2500 mg | Freq: Once | INTRAVENOUS | Status: AC
  Administered 2019-12-02: 0.25 mg via INTRAVENOUS

## 2019-12-02 MED ORDER — SODIUM CHLORIDE 0.9 % IV SOLN
150.0000 mg | Freq: Once | INTRAVENOUS | Status: AC
  Administered 2019-12-02: 150 mg via INTRAVENOUS
  Filled 2019-12-02: qty 5

## 2019-12-02 MED ORDER — SODIUM CHLORIDE 0.9 % IV SOLN
Freq: Once | INTRAVENOUS | Status: AC
  Filled 2019-12-02: qty 250

## 2019-12-02 MED ORDER — SODIUM CHLORIDE 0.9 % IV SOLN
600.0000 mg/m2 | Freq: Once | INTRAVENOUS | Status: AC
  Administered 2019-12-02: 14:00:00 1140 mg via INTRAVENOUS
  Filled 2019-12-02: qty 50

## 2019-12-02 NOTE — Progress Notes (Signed)
Pt tolerated Adriaymycin and Cytoxan infusion well with no signs of complications or reaction. RN educated pt on the importance of calling the clinic if any complications occur at home and if it is an emergency to call 911. Pt verbalized understanding and all questions answered at this time. Pt stable for discharge.   Areebah Meinders [employer]

## 2019-12-02 NOTE — Progress Notes (Signed)
Met patient in infusion during chemotherapy.   Doing well .  She has received phone call from [employer] for assistance with disability.  Checking with Dr. Grayland Ormond about CoVID vaccination recommendation.

## 2019-12-03 ENCOUNTER — Inpatient Hospital Stay: Payer: Medicaid Other | Attending: Oncology | Admitting: Oncology

## 2019-12-03 ENCOUNTER — Telehealth: Payer: Self-pay

## 2019-12-03 DIAGNOSIS — J449 Chronic obstructive pulmonary disease, unspecified: Secondary | ICD-10-CM | POA: Insufficient documentation

## 2019-12-03 DIAGNOSIS — Z5189 Encounter for other specified aftercare: Secondary | ICD-10-CM | POA: Insufficient documentation

## 2019-12-03 DIAGNOSIS — Z803 Family history of malignant neoplasm of breast: Secondary | ICD-10-CM | POA: Insufficient documentation

## 2019-12-03 DIAGNOSIS — C50411 Malignant neoplasm of upper-outer quadrant of right female breast: Secondary | ICD-10-CM | POA: Insufficient documentation

## 2019-12-03 DIAGNOSIS — R519 Headache, unspecified: Secondary | ICD-10-CM | POA: Insufficient documentation

## 2019-12-03 DIAGNOSIS — Z5111 Encounter for antineoplastic chemotherapy: Secondary | ICD-10-CM | POA: Insufficient documentation

## 2019-12-03 DIAGNOSIS — Z170 Estrogen receptor positive status [ER+]: Secondary | ICD-10-CM | POA: Insufficient documentation

## 2019-12-03 DIAGNOSIS — G4700 Insomnia, unspecified: Secondary | ICD-10-CM | POA: Insufficient documentation

## 2019-12-03 DIAGNOSIS — E876 Hypokalemia: Secondary | ICD-10-CM | POA: Insufficient documentation

## 2019-12-03 DIAGNOSIS — Z801 Family history of malignant neoplasm of trachea, bronchus and lung: Secondary | ICD-10-CM | POA: Insufficient documentation

## 2019-12-03 DIAGNOSIS — D72829 Elevated white blood cell count, unspecified: Secondary | ICD-10-CM | POA: Insufficient documentation

## 2019-12-03 DIAGNOSIS — F419 Anxiety disorder, unspecified: Secondary | ICD-10-CM | POA: Insufficient documentation

## 2019-12-03 DIAGNOSIS — F17210 Nicotine dependence, cigarettes, uncomplicated: Secondary | ICD-10-CM | POA: Insufficient documentation

## 2019-12-03 NOTE — Progress Notes (Signed)
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  INTERVAL HISTORY: Patient returns to clinic today for further evaluation and to assess her toleration of cycle 1 of Adriamycin and Cytoxan.  Patient reports she had a "bad day" the Saturday after treatment, but otherwise tolerated it well.  She is having increased anxiety and insomnia.  She has occasional headache, but no other no neurologic complaints.  She denies any recent fevers or illnesses.  She has a good appetite and denies weight loss.  She has no chest pain, shortness of breath, cough, or hemoptysis.  She denies any nausea, vomiting, constipation, or diarrhea.  She has no urinary complaints.  Patient offers no further specific complaints today.  REVIEW OF SYSTEMS:   Review of Systems  Constitutional: Negative.  Negative for fever, malaise/fatigue and weight loss.  Respiratory: Negative.  Negative for cough, hemoptysis and shortness of breath.   Cardiovascular: Negative.  Negative for chest pain and leg swelling.  Gastrointestinal: Negative.  Negative for abdominal pain.  Genitourinary: Negative.  Negative for dysuria.  Musculoskeletal: Negative.  Negative for back pain.  Skin: Negative.  Negative for rash.  Neurological: Positive for headaches. Negative for dizziness, focal weakness and weakness.  Psychiatric/Behavioral: The patient is nervous/anxious and has insomnia.     As per HPI. Otherwise, a complete review of systems is negative.  PAST MEDICAL HISTORY: Past Medical History:  Diagnosis Date  . Brain tumor (benign) ([place])   . Breast  cancer ([place]) 1999   Status post right breast lumpectomy and radiation  . COPD (chronic obstructive pulmonary disease) ([place])   . Depression   . History of chemotherapy 1999  . History of seizure disorder   . Hypertension   . Personal history of radiation therapy 1999  . Pneumonia     PAST SURGICAL HISTORY: Past Surgical History:  Procedure Laterality Date  . BRAIN SURGERY    . Brain tumor resection  1991  . BREAST BIOPSY Right 1999   Positive  . BREAST BIOPSY Right 11/10/2019   [place] bx 10:00 heart marker, path pending  . BREAST BIOPSY Right 11/10/2019   [place] bx axilla, hydromark 4, path pending  . BREAST BIOPSY Left 11/10/2019   [place] bx 2:00, venus marker, path pending  . PORTACATH PLACEMENT Left 11/30/2019   Procedure: INSERTION PORT-A-CATH;  Surgeon: Robert Bellow, MD;  Location: [facility] ORS;  Service: General;  Laterality: Left;  . Right breast lumpectomy    . TONSILLECTOMY    . TUBAL LIGATION      FAMILY HISTORY: Family History  Problem Relation Age of Onset  . Depression Mother   . CAD Father   . Hypertension Father   . Lung cancer Father   . Breast cancer Maternal Grandmother     ADVANCED DIRECTIVES (Y/N):  N  HEALTH MAINTENANCE: Social History   Tobacco Use  . Smoking status: Current Every Day Smoker    Packs/day: 0.50    Types: Cigarettes  . Smokeless tobacco: Never Used  Substance Use Topics  . Alcohol use: No    Alcohol/week: 0.0 standard drinks  . Drug use: No    Comment: Marijuana a  year ago 2020     Colonoscopy:  PAP:  Bone density:  Lipid panel:  Allergies  Allergen Reactions  . Imitrex [Sumatriptan] Anaphylaxis  . Penicillins Rash    Did it involve swelling of the face/tongue/throat, SOB, or low BP? Unknown Did it involve sudden or severe rash/hives, skin peeling, or any reaction on the inside of your mouth or nose? Unknown Did you need to seek medical attention at a hospital or doctor's office? Unknown When did it last happen?? If all  above answers are "NO", may proceed with cephalosporin use.     Current Outpatient Medications  Medication Sig Dispense Refill  . acetaminophen (TYLENOL) 500 MG tablet Take 500-1,000 mg by mouth every 6 (six) hours as needed (for pain.).    Marland Kitchen albuterol (VENTOLIN HFA) 108 (90 Base) MCG/ACT inhaler Inhale 1-2 puffs into the lungs every 6 (six) hours as needed for wheezing or shortness of breath.    . ALPRAZolam (XANAX) 0.25 MG tablet Take 1 tablet (0.25 mg total) by mouth at bedtime as needed for anxiety. 30 tablet 0  . amLODipine (NORVASC) 10 MG tablet Take 10 mg by mouth daily.     Marland Kitchen lidocaine-prilocaine (EMLA) cream Apply to affected area once 30 g 3  . Melatonin 5 MG TABS Take 1 tablet by mouth at bedtime and may repeat dose one time if needed.    . ondansetron (ZOFRAN) 8 MG tablet Take 1 tablet (8 mg total) by mouth 2 (two) times daily as needed. 60 tablet 2  . prochlorperazine (COMPAZINE) 10 MG tablet Take 1 tablet (10 mg total) by mouth every 6 (six) hours as needed (Nausea or vomiting). 60 tablet 2  . Pseudoeph-Doxylamine-DM-APAP (NYQUIL PO) Take 1-2 capsules by mouth at bedtime as needed (sleep.).     Marland Kitchen tetrahydrozoline 0.05 % ophthalmic solution Place 1-2 drops into both eyes 3 (three) times daily as needed (dry/irritated eyes).     No current facility-administered medications for this visit.    OBJECTIVE: Vitals:   12/08/19 0930  BP: (!) 144/94  Pulse: 96  Resp: 20  Temp: 97.9 F (36.6 C)  SpO2: 100%     Body mass index is 26.11 kg/m.    ECOG FS:0 - Asymptomatic  General: Well-developed, well-nourished, no acute distress. Eyes: Pink conjunctiva, anicteric sclera. HEENT: Normocephalic, moist mucous membranes. Lungs: No audible wheezing or coughing. Heart: Regular rate and rhythm. Abdomen: Soft, nontender, no obvious distention. Musculoskeletal: No edema, cyanosis, or clubbing. Neuro: Alert, answering all questions appropriately. Cranial nerves grossly intact. Skin: No  rashes or petechiae noted. Psych: Normal affect.  LAB RESULTS:  Lab Results  Component Value Date   NA 135 12/08/2019   K 3.4 (L) 12/08/2019   CL 102 12/08/2019   CO2 24 12/08/2019   GLUCOSE 110 (H) 12/08/2019   BUN 13 12/08/2019   CREATININE 0.61 12/08/2019   CALCIUM 9.1 12/08/2019   PROT 7.5 12/08/2019   ALBUMIN 4.1 12/08/2019   AST 16 12/08/2019   ALT 10 12/08/2019   ALKPHOS 144 (H) 12/08/2019   BILITOT 0.5 12/08/2019   GFRNONAA >60 12/08/2019   GFRAA >60 12/08/2019    Lab Results  Component Value Date   WBC 12.0 (H) 12/08/2019   NEUTROABS 10.0 (H) 12/08/2019   HGB 14.2 12/08/2019   HCT 41.7 12/08/2019   MCV 91.0 12/08/2019   PLT 194 12/08/2019     STUDIES: CT Chest W Contrast  Result Date: 12/01/2019 CLINICAL DATA:  Newly diagnosed right breast cancer. Pretreatment staging.  EXAM: CT CHEST, ABDOMEN, AND PELVIS WITH CONTRAST TECHNIQUE: Multidetector CT imaging of the chest, abdomen and pelvis was performed following the standard protocol during bolus administration of intravenous contrast. CONTRAST:  134m OMNIPAQUE IOHEXOL 300 MG/ML  SOLN COMPARISON:  Report only from abdominal CT 05/17/2004. FINDINGS: CT CHEST FINDINGS Cardiovascular: Mild atherosclerosis of the aorta, great vessels and coronary arteries. There is a left subclavian Port-A-Cath which extends to the lower SVC level. The heart size is normal. There is no pericardial effusion. Mediastinum/Nodes: There are asymmetric right axillary lymph nodes which are not pathologically enlarged, measuring up to 8 mm short axis on image 17/2. No enlarged mediastinal, hilar or internal mammary lymph nodes. The thyroid gland, trachea and esophagus demonstrate no significant findings. Lungs/Pleura: There is no pleural effusion. Moderate to severe centrilobular emphysema with mild biapical scarring. There is additional linear scarring or atelectasis at both lung bases. No suspicious nodularity. Musculoskeletal/Chest wall: Central  right breast mass measuring up to 2.0 cm on image 27/2. No other chest wall mass or suspicious osseous findings. CT ABDOMEN AND PELVIS FINDINGS Hepatobiliary: There are several hepatic cysts, measuring up to 3.3 cm centrally in the left hepatic lobe on image 55/2. No suspicious hepatic findings. No evidence of gallstones, gallbladder wall thickening or biliary dilatation. Pancreas: Unremarkable. No pancreatic ductal dilatation or surrounding inflammatory changes. Spleen: Normal in size without focal abnormality. Adrenals/Urinary Tract: Both adrenal glands appear normal. Small bilateral renal cysts. No evidence of renal mass, urinary tract calculus or hydronephrosis. The bladder appears normal. Stomach/Bowel: No evidence of bowel wall thickening, distention or surrounding inflammatory change. Mild sigmoid colon diverticular changes. Vascular/Lymphatic: There are no enlarged abdominal or pelvic lymph nodes. Aortic and branch vessel atherosclerosis. No acute vascular findings. Reproductive: The uterus and ovaries appear normal. Other: Intact anterior abdominal wall. No ascites or peritoneal nodularity. Musculoskeletal: No acute or significant osseous findings. Multilevel spondylosis with mild osseous foraminal narrowing bilaterally at L5-S1. There are sacral Tarlov cysts. IMPRESSION: 1. Central right breast mass consistent with primary breast cancer. There are asymmetric right axillary lymph nodes which are not pathologically enlarged. Patient has known right axillary nodal disease by previous biopsy 11/10/2019. 2. No other evidence of metastatic disease in the chest, abdomen or pelvis. 3. Hepatic and renal cysts. 4. Aortic Atherosclerosis (ICD10-I70.0) and Emphysema (ICD10-J43.9). Electronically Signed   By: WRichardean SaleM.D.   On: 12/01/2019 13:24   NM Cardiac Muga Rest  Result Date: 11/26/2019 CLINICAL DATA:  RIGHT breast cancer, pre cardiotoxic chemotherapy EXAM: NUCLEAR MEDICINE CARDIAC BLOOD POOL IMAGING  (MUGA) TECHNIQUE: Cardiac multi-gated acquisition was performed at rest following intravenous injection of Tc-913mabeled red blood cells. RADIOPHARMACEUTICALS:  21.7 mCi [id number] in-vitro labeled red blood cells IV COMPARISON:  None FINDINGS: Calculated LEFT ventricular ejection fraction is 60.8%, normal. Study was obtained at a cardiac rate of 74 bpm. Patient was rhythmic during imaging. Cine analysis of the LEFT ventricle in 3 projections demonstrates normal LEFT ventricular wall motion. IMPRESSION: Normal LEFT ventricular ejection fraction of 60.8% with normal LV wall motion. Electronically Signed   By: MarLavonia DanaD.   On: 11/26/2019 16:18   CT Abdomen Pelvis W Contrast  Result Date: 12/01/2019 CLINICAL DATA:  Newly diagnosed right breast cancer. Pretreatment staging. EXAM: CT CHEST, ABDOMEN, AND PELVIS WITH CONTRAST TECHNIQUE: Multidetector CT imaging of the chest, abdomen and pelvis was performed following the standard protocol during bolus administration of intravenous contrast. CONTRAST:  100m63mNIPAQUE IOHEXOL 300 MG/ML  SOLN COMPARISON:  Report only from  abdominal CT 05/17/2004. FINDINGS: CT CHEST FINDINGS Cardiovascular: Mild atherosclerosis of the aorta, great vessels and coronary arteries. There is a left subclavian Port-A-Cath which extends to the lower SVC level. The heart size is normal. There is no pericardial effusion. Mediastinum/Nodes: There are asymmetric right axillary lymph nodes which are not pathologically enlarged, measuring up to 8 mm short axis on image 17/2. No enlarged mediastinal, hilar or internal mammary lymph nodes. The thyroid gland, trachea and esophagus demonstrate no significant findings. Lungs/Pleura: There is no pleural effusion. Moderate to severe centrilobular emphysema with mild biapical scarring. There is additional linear scarring or atelectasis at both lung bases. No suspicious nodularity. Musculoskeletal/Chest wall: Central right breast mass  measuring up to 2.0 cm on image 27/2. No other chest wall mass or suspicious osseous findings. CT ABDOMEN AND PELVIS FINDINGS Hepatobiliary: There are several hepatic cysts, measuring up to 3.3 cm centrally in the left hepatic lobe on image 55/2. No suspicious hepatic findings. No evidence of gallstones, gallbladder wall thickening or biliary dilatation. Pancreas: Unremarkable. No pancreatic ductal dilatation or surrounding inflammatory changes. Spleen: Normal in size without focal abnormality. Adrenals/Urinary Tract: Both adrenal glands appear normal. Small bilateral renal cysts. No evidence of renal mass, urinary tract calculus or hydronephrosis. The bladder appears normal. Stomach/Bowel: No evidence of bowel wall thickening, distention or surrounding inflammatory change. Mild sigmoid colon diverticular changes. Vascular/Lymphatic: There are no enlarged abdominal or pelvic lymph nodes. Aortic and branch vessel atherosclerosis. No acute vascular findings. Reproductive: The uterus and ovaries appear normal. Other: Intact anterior abdominal wall. No ascites or peritoneal nodularity. Musculoskeletal: No acute or significant osseous findings. Multilevel spondylosis with mild osseous foraminal narrowing bilaterally at L5-S1. There are sacral Tarlov cysts. IMPRESSION: 1. Central right breast mass consistent with primary breast cancer. There are asymmetric right axillary lymph nodes which are not pathologically enlarged. Patient has known right axillary nodal disease by previous biopsy 11/10/2019. 2. No other evidence of metastatic disease in the chest, abdomen or pelvis. 3. Hepatic and renal cysts. 4. Aortic Atherosclerosis (ICD10-I70.0) and Emphysema (ICD10-J43.9). Electronically Signed   By: Richardean Sale M.D.   On: 12/01/2019 13:24   DG Chest Port 1 View  Result Date: 11/30/2019 CLINICAL DATA:  Follow-up Port-A-Cath placement. EXAM: PORTABLE CHEST 1 VIEW COMPARISON:  11/26/2019 FINDINGS: Port-A-Cath placed from  a left subclavian approach. Tip is at the [facility] RA junction or just within the proximal right atrium. Patient has taken a poor inspiration. Allowing for that, the lungs are probably clear. Can not rule out an element of mild atelectasis or fluid overload, but favor findings are secondary to poor inspiration. No pneumothorax. IMPRESSION: Port-A-Cath tip at the SVC RA junction or just within the right atrium. No pneumothorax. Poor inspiration. See above. Electronically Signed   By: Nelson Chimes M.D.   On: 11/30/2019 11:09   DG C-Arm 1-60 Min-No Report  Result Date: 11/30/2019 Fluoroscopy was utilized by the requesting physician.  No radiographic interpretation.   MS CLIP PLACEMENT LEFT  Result Date: 11/10/2019 CLINICAL DATA:  Post ultrasound-guided biopsy of a mass in the right breast at the 10 o'clock position, ultrasound-guided biopsy of an abnormal lymph node in the right axilla and ultrasound-guided biopsy of a mass in the left breast at the 2 o'clock position. EXAM: DIAGNOSTIC BILATERAL MAMMOGRAM POST ULTRASOUND BIOPSY COMPARISON:  Previous exam(s). FINDINGS: Mammographic images were obtained following ultrasound-guided biopsy of a mass in the right breast at the 10 o'clock position, ultrasound-guided biopsy of an abnormal lymph node in the right  axilla and ultrasound-guided biopsy of a mass in the left breast at the 2 o'clock position. Heart shaped biopsy marking clip is present at the site of the biopsied mass in the right breast at the 10 o'clock position. The biopsied lymph node in the right axilla could not be included in the field of view. A venous shaped biopsy marking clip is present at the site of the biopsied mass in the left breast at the 2 o'clock position. IMPRESSION: 1. Heart shaped biopsy marking clip at site of biopsied mass in the right breast at the 10 o'clock position. 2. Biopsied lymph node could not be included in the field of view on the post biopsy mammogram. 3. Venus shaped biopsy  marking clip at site of biopsied mass in the left breast at the 2 o'clock position. Final Assessment: Post Procedure Mammograms for Marker Placement Electronically Signed   By: Everlean Alstrom M.D.   On: 11/10/2019 09:40   MS CLIP PLACEMENT RIGHT  Result Date: 11/10/2019 CLINICAL DATA:  Post ultrasound-guided biopsy of a mass in the right breast at the 10 o'clock position, ultrasound-guided biopsy of an abnormal lymph node in the right axilla and ultrasound-guided biopsy of a mass in the left breast at the 2 o'clock position. EXAM: DIAGNOSTIC BILATERAL MAMMOGRAM POST ULTRASOUND BIOPSY COMPARISON:  Previous exam(s). FINDINGS: Mammographic images were obtained following ultrasound-guided biopsy of a mass in the right breast at the 10 o'clock position, ultrasound-guided biopsy of an abnormal lymph node in the right axilla and ultrasound-guided biopsy of a mass in the left breast at the 2 o'clock position. Heart shaped biopsy marking clip is present at the site of the biopsied mass in the right breast at the 10 o'clock position. The biopsied lymph node in the right axilla could not be included in the field of view. A venous shaped biopsy marking clip is present at the site of the biopsied mass in the left breast at the 2 o'clock position. IMPRESSION: 1. Heart shaped biopsy marking clip at site of biopsied mass in the right breast at the 10 o'clock position. 2. Biopsied lymph node could not be included in the field of view on the post biopsy mammogram. 3. Venus shaped biopsy marking clip at site of biopsied mass in the left breast at the 2 o'clock position. Final Assessment: Post Procedure Mammograms for Marker Placement Electronically Signed   By: Everlean Alstrom M.D.   On: 11/10/2019 09:40   [place] LT BREAST BX W LOC DEV 1ST LESION IMG BX SPEC US GUIDE  Addendum Date: 11/17/2019   ADDENDUM REPORT: 11/13/2019 08:48 ADDENDUM: Pathology revealed GRADE III INVASIVE MAMMARY CARCINOMA of the RIGHT breast, 10:00 2cmfn.  This was found to be concordant by Dr. Everlean Alstrom. 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 This was found to be concordant by Dr. Everlean Alstrom. Pathology revealed FIBROADENOMA of the LEFT breast, 2:00 3cmfn. This was found to be concordant by Dr. Everlean Alstrom. Pathology results were discussed with the patient by telephone. The patient reported doing well after the biopsies with tenderness at the sites. Post biopsy instructions and care were reviewed and questions were answered. The patient was encouraged to call [facility] for any additional concerns. Al Pimple RN Nurse Navigator and BCCCP Coordinator has arranged appointment with Dr. Delight Hoh of [facility]. Pathology results reported by Stacie Acres RN on 11/13/2019. Electronically Signed   By: Lily Lovings.D.  On: 11/13/2019 08:48   Result Date: 11/17/2019 CLINICAL DATA:  54 year old female with a suspicious mass in the right breast at the 10 o'clock position. Abnormal lymph node in the right axilla, and indeterminate mass in the left breast at the 2 o'clock position. EXAM: ULTRASOUND GUIDED RIGHT BREAST AND RIGHT AXILLA CORE NEEDLE BIOPSY ULTRASOUND GUIDED LEFT BREAST CORE NEEDLE BIOPSY COMPARISON:  Previous exam(s). PROCEDURE: I met with the patient and we discussed the procedure of ultrasound-guided biopsy, including benefits and alternatives. We discussed the high likelihood of a successful procedure. We discussed the risks of the procedure, including infection, bleeding, tissue injury, clip migration, and inadequate sampling. Informed written consent was given. The usual time-out protocol was performed immediately prior to the procedure. RIGHT BREAST 10 O'CLOCK: MASS: Lesion quadrant: UPPER-OUTER Using sterile technique and 1% Lidocaine as local anesthetic, under direct ultrasound visualization, a 14 gauge  spring-loaded device was used to perform biopsy of the mass in the right breast at the 10 o'clock position using a lateral to medial approach. At the conclusion of the procedure a heart shaped tissue marker clip was deployed into the biopsy cavity. Follow up 2 view mammogram was performed and dictated separately. RIGHT AXILLA: LYMPH NODE: Lesion quadrant: UPPER-OUTER Using sterile technique and 1% Lidocaine as local anesthetic, under direct ultrasound visualization, a 14 gauge spring-loaded device was used to perform biopsy of the morphologically abnormal lymph node in the right axilla using a inferior to superior approach. At the conclusion of the procedure a HydroMARK tissue marker clip was deployed into the biopsy cavity. Follow up 2 view mammogram was performed and dictated separately. LEFT BREAST 2 O'CLOCK: MASS: Lesion quadrant: UPPER-OUTER Using sterile technique and 1% Lidocaine as local anesthetic, under direct ultrasound visualization, a 14 gauge spring-loaded device was used to perform biopsy of the mass in the left breast at the 2 o'clock position using a lateral to medial approach. At the conclusion of the procedure a venous shaped tissue marker clip was deployed into the biopsy cavity. Follow up 2 view mammogram was performed and dictated separately. IMPRESSION: 1. Ultrasound-guided biopsy of the mass in the right breast at the 10 o'clock position. 2.  Ultrasound-guided biopsy of the lymph node in the right axilla. 3. Ultrasound-guided biopsy of the mass in the left breast at the 2 o'clock position. Electronically Signed: By: Everlean Alstrom M.D. 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 This was found to be concordant by Dr. Everlean Alstrom. Pathology revealed METASTATIC MAMMARY CARCINOMA of a  RIGHT axillary lymph node. This was found to be concordant by Dr. Everlean Alstrom. Pathology revealed FIBROADENOMA of the LEFT breast, 2:00 3cmfn. This was found to be concordant by Dr. Everlean Alstrom. Pathology results were discussed with the patient by telephone. The patient reported doing well after the biopsies with tenderness at the sites. Post biopsy instructions and care were reviewed and questions were answered. The patient was encouraged to call [facility] for any additional concerns. Al Pimple RN Nurse Navigator and BCCCP Coordinator has arranged appointment with Dr. Delight Hoh of [facility]. Pathology results reported by Stacie Acres RN on 11/13/2019. Electronically Signed   By: Everlean Alstrom M.D.   On: 11/13/2019 08:48   Result Date: 11/17/2019  CLINICAL DATA:  54 year old female with a suspicious mass in the right breast at the 10 o'clock position. Abnormal lymph node in the right axilla, and indeterminate mass in the left breast at the 2 o'clock position. EXAM: ULTRASOUND GUIDED RIGHT BREAST AND RIGHT AXILLA CORE NEEDLE BIOPSY ULTRASOUND GUIDED LEFT BREAST CORE NEEDLE BIOPSY COMPARISON:  Previous exam(s). PROCEDURE: I met with the patient and we discussed the procedure of ultrasound-guided biopsy, including benefits and alternatives. We discussed the high likelihood of a successful procedure. We discussed the risks of the procedure, including infection, bleeding, tissue injury, clip migration, and inadequate sampling. Informed written consent was given. The usual time-out protocol was performed immediately prior to the procedure. RIGHT BREAST 10 O'CLOCK: MASS: Lesion quadrant: UPPER-OUTER Using sterile technique and 1% Lidocaine as local anesthetic, under direct ultrasound visualization, a 14 gauge spring-loaded device was used to perform biopsy of the mass in the right breast at the 10 o'clock position using a  lateral to medial approach. At the conclusion of the procedure a heart shaped tissue marker clip was deployed into the biopsy cavity. Follow up 2 view mammogram was performed and dictated separately. RIGHT AXILLA: LYMPH NODE: Lesion quadrant: UPPER-OUTER Using sterile technique and 1% Lidocaine as local anesthetic, under direct ultrasound visualization, a 14 gauge spring-loaded device was used to perform biopsy of the morphologically abnormal lymph node in the right axilla using a inferior to superior approach. At the conclusion of the procedure a HydroMARK tissue marker clip was deployed into the biopsy cavity. Follow up 2 view mammogram was performed and dictated separately. LEFT BREAST 2 O'CLOCK: MASS: Lesion quadrant: UPPER-OUTER Using sterile technique and 1% Lidocaine as local anesthetic, under direct ultrasound visualization, a 14 gauge spring-loaded device was used to perform biopsy of the mass in the left breast at the 2 o'clock position using a lateral to medial approach. At the conclusion of the procedure a venous shaped tissue marker clip was deployed into the biopsy cavity. Follow up 2 view mammogram was performed and dictated separately. IMPRESSION: 1. Ultrasound-guided biopsy of the mass in the right breast at the 10 o'clock position. 2.  Ultrasound-guided biopsy of the lymph node in the right axilla. 3. Ultrasound-guided biopsy of the mass in the left breast at the 2 o'clock position. Electronically Signed: By: Everlean Alstrom M.D. 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 This was found to be concordant by Dr. Everlean Alstrom. 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 This was found to be concordant by Dr. Everlean Alstrom. Pathology revealed FIBROADENOMA  of the LEFT breast, 2:00 3cmfn. This was found to be concordant by Dr. Everlean Alstrom. Pathology results were discussed with the patient by telephone. The patient reported doing well after the biopsies with tenderness at the sites. Post biopsy instructions and care were reviewed and questions were answered. The patient was encouraged to call [facility] for any additional concerns. Al Pimple RN Nurse Navigator and BCCCP Coordinator has arranged appointment with Dr. Delight Hoh of [facility]. Pathology results reported by Stacie Acres RN on 11/13/2019. Electronically Signed   By: Everlean Alstrom M.D.   On: 11/13/2019 08:48   Result Date: 11/17/2019 CLINICAL DATA:  54 year old female with a  suspicious mass in the right breast at the 10 o'clock position. Abnormal lymph node in the right axilla, and indeterminate mass in the left breast at the 2 o'clock position. EXAM: ULTRASOUND GUIDED RIGHT BREAST AND RIGHT AXILLA CORE NEEDLE BIOPSY ULTRASOUND GUIDED LEFT BREAST CORE NEEDLE BIOPSY COMPARISON:  Previous exam(s). PROCEDURE: I met with the patient and we discussed the procedure of ultrasound-guided biopsy, including benefits and alternatives. We discussed the high likelihood of a successful procedure. We discussed the risks of the procedure, including infection, bleeding, tissue injury, clip migration, and inadequate sampling. Informed written consent was given. The usual time-out protocol was performed immediately prior to the procedure. RIGHT BREAST 10 O'CLOCK: MASS: Lesion quadrant: UPPER-OUTER Using sterile technique and 1% Lidocaine as local anesthetic, under direct ultrasound visualization, a 14 gauge spring-loaded device was used to perform biopsy of the mass in the right breast at the 10 o'clock position using a lateral to medial approach. At the conclusion of the procedure a heart shaped tissue marker clip was deployed into the  biopsy cavity. Follow up 2 view mammogram was performed and dictated separately. RIGHT AXILLA: LYMPH NODE: Lesion quadrant: UPPER-OUTER Using sterile technique and 1% Lidocaine as local anesthetic, under direct ultrasound visualization, a 14 gauge spring-loaded device was used to perform biopsy of the morphologically abnormal lymph node in the right axilla using a inferior to superior approach. At the conclusion of the procedure a HydroMARK tissue marker clip was deployed into the biopsy cavity. Follow up 2 view mammogram was performed and dictated separately. LEFT BREAST 2 O'CLOCK: MASS: Lesion quadrant: UPPER-OUTER Using sterile technique and 1% Lidocaine as local anesthetic, under direct ultrasound visualization, a 14 gauge spring-loaded device was used to perform biopsy of the mass in the left breast at the 2 o'clock position using a lateral to medial approach. At the conclusion of the procedure a venous shaped tissue marker clip was deployed into the biopsy cavity. Follow up 2 view mammogram was performed and dictated separately. IMPRESSION: 1. Ultrasound-guided biopsy of the mass in the right breast at the 10 o'clock position. 2.  Ultrasound-guided biopsy of the lymph node in the right axilla. 3. Ultrasound-guided biopsy of the mass in the left breast at the 2 o'clock position. Electronically Signed: By: Everlean Alstrom M.D. 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  PLAN:    1. 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  Case also discussed with surgery.  Given the size and stage of patient's tumor, she will benefit from neoadjuvant chemotherapy with Adriamycin, Cytoxan and Udenyca support followed by weekly Taxol.  MUGA scan from November 26, 2019 revealed an EF of approximately 61%. CT scan of the  chest, abdomen, and pelvis on December 01, 2019 reviewed independently with no obvious evidence of metastatic disease.  Patient has now had port placement.  Patient received cycle 1 of 4 of Adriamycin and Cytoxan last week and tolerated treatment relatively well.  Return to clinic in 1 week as previously scheduled for further evaluation and consideration of cycle 2.   2.  History of DCIS: Status post lumpectomy and XRT.  Patient reports she did not take any adjuvant hormonal therapy.  Given this history, have referred patient to genetic counseling for evaluation and testing. 3.  Anxiety/insomnia: Patient was given a prescription for Xanax today. 4.  Leukocytosis: Likely secondary  to [place].  Monitor. 5.  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  Monitor. 6.  Hypokalemia: Mild.  Patient was given dietary recommendations.   Patient expressed understanding and was in agreement with this plan. She also understands that She can call clinic at any time with any questions, concerns, or complaints.   Cancer Staging Primary cancer of upper outer quadrant of right female breast [facility]) Staging form: Breast, AJCC 8th Edition - Clinical stage from 11/26/2019: Stage IIIA (cT2, cN2a, cM0, G3, ER+, PR+, HER2-) - Signed by Lloyd Huger, MD on 11/26/2019   Lloyd Huger, MD   12/08/2019 10:28 AM

## 2019-12-03 NOTE — Telephone Encounter (Signed)
Telephone call to patient for follow up after receiving first infusion.   Patient states infusion went great.  States eating good and trying to drinking plenty of fluids.   Denies any vomiting but has experience some nausea but relieved after taking anti-nausea meds.  Encouraged patient to call for any concerns or questions.

## 2019-12-03 NOTE — Progress Notes (Signed)
[place]  Telephone:([phone] [phone]  Patient Care Team: Patient, No Pcp Per as PCP - General (General Practice) Kelsey Junker, RN as Registered Nurse Kelsey Demark, RN as Oncology Nurse Navigator (Oncology)   Name of the patient: Kelsey Russo  [id number]  Apr 16, 1966   Date of visit: 12/03/19  Diagnosis-stage IIIa ER/PR positive HER-2/neu negative right breast cancer  Chief complaint/Reason for visit- Initial Meeting for [facility], preparing for starting chemotherapy  I connected with Kelsey Russo on 12/03/19 at  9:30 AM EDT by telephone visit and verified that I am speaking with the correct person using two identifiers.   I discussed the limitations, risks, security and privacy concerns of performing an evaluation and management service by telemedicine and the availability of in-person appointments. I also discussed with the patient that there may be a patient responsible charge related to this service. The patient expressed understanding and agreed to proceed.   Other persons participating in the visit and their role in the encounter: None  Patient's location: Home Provider's location: Office  Heme/Onc history:  Oncology History  Primary cancer of upper outer quadrant of right female breast ([place])  11/19/2019 Initial Diagnosis   Primary cancer of upper outer quadrant of right female breast ([place])   11/26/2019 Cancer Staging   Staging form: Breast, AJCC 8th Edition - Clinical stage from 11/26/2019: Stage IIIA (cT2, cN2a, cM0, G3, ER+, PR+, HER2-) - Signed by Lloyd Huger, MD on 11/26/2019   12/02/2019 -  Chemotherapy   The patient had DOXOrubicin (ADRIAMYCIN) chemo injection 114 mg, 60 mg/m2 = 114 mg, Intravenous,  Once, 1 of 4 cycles Administration: 114 mg (12/02/2019) palonosetron (ALOXI) injection 0.25 mg, 0.25 mg, Intravenous,  Once, 1 of 4 cycles Administration: 0.25 mg  (12/02/2019) pegfilgrastim-cbqv (UDENYCA) injection 6 mg, 6 mg, Subcutaneous, Once, 1 of 4 cycles cyclophosphamide (CYTOXAN) 1,140 mg in sodium chloride 0.9 % 250 mL chemo infusion, 600 mg/m2 = 1,140 mg, Intravenous,  Once, 1 of 4 cycles Administration: 1,140 mg (12/02/2019) PACLitaxel (TAXOL) 150 mg in sodium chloride 0.9 % 250 mL chemo infusion (</= '80mg'$ /m2), 80 mg/m2 = 150 mg, Intravenous,  Once, 0 of 12 cycles fosaprepitant (EMEND) 150 mg in sodium chloride 0.9 % 145 mL IVPB, 150 mg, Intravenous,  Once, 1 of 4 cycles Administration: 150 mg (12/02/2019)  for chemotherapy treatment.      Interval history-Kelsey Russo is a 54 year old female who presents to chemo care clinic today for initial meeting in preparation for starting chemotherapy. I introduced the chemo care clinic and we discussed that the role of the clinic is to assist those who are at an increased risk of emergency room visits and/or complications during the course of chemotherapy treatment. We discussed that the increased risk takes into account factors such as age, performance status, and co-morbidities. We also discussed that for some, this might include barriers to care such as not having a primary care provider, lack of insurance/transportation, or not being able to afford medications. We discussed that the goal of the program is to help prevent unplanned ER visits and help reduce complications during chemotherapy. We do this by discussing specific risk factors to each individual and identifying ways that we can help improve these risk factors and reduce barriers to care.   ECOG FS:0 - Asymptomatic  Review of systems- Review of Systems  Constitutional: Negative.  Negative for chills, fever, malaise/fatigue and weight loss.  HENT: Negative  for congestion, ear pain and tinnitus.   Eyes: Negative.  Negative for blurred vision and double vision.  Respiratory: Negative.  Negative for cough, sputum production and shortness of breath.     Cardiovascular: Negative.  Negative for chest pain, palpitations and leg swelling.  Gastrointestinal: Negative.  Negative for abdominal pain, constipation, diarrhea, nausea and vomiting.  Genitourinary: Negative for dysuria, frequency and urgency.  Musculoskeletal: Negative for back pain and falls.  Skin: Negative.  Negative for rash.  Neurological: Negative.  Negative for weakness and headaches.  Endo/Heme/Allergies: Negative.  Does not bruise/bleed easily.  Psychiatric/Behavioral: Negative.  Negative for depression. The patient is not nervous/anxious and does not have insomnia.     Current treatment-Adriamycin Cytoxan  Allergies  Allergen Reactions  . Imitrex [Sumatriptan] Anaphylaxis  . Penicillins Rash    Did it involve swelling of the face/tongue/throat, SOB, or low BP? Unknown Did it involve sudden or severe rash/hives, skin peeling, or any reaction on the inside of your mouth or nose? Unknown Did you need to seek medical attention at a hospital or doctor's office? Unknown When did it last happen?? If all above answers are "NO", may proceed with cephalosporin use.     Past Medical History:  Diagnosis Date  . Brain tumor (benign) ([place])   . Breast cancer ([place]) 1999   Status post right breast lumpectomy and radiation  . COPD (chronic obstructive pulmonary disease) ([place])   . Depression   . History of chemotherapy 1999  . History of seizure disorder   . Hypertension   . Personal history of radiation therapy 1999  . Pneumonia     Past Surgical History:  Procedure Laterality Date  . BRAIN SURGERY    . Brain tumor resection  1991  . BREAST BIOPSY Right 1999   Positive  . BREAST BIOPSY Right 11/10/2019   [place] bx 10:00 heart marker, path pending  . BREAST BIOPSY Right 11/10/2019   [place] bx axilla, hydromark 4, path pending  . BREAST BIOPSY Left 11/10/2019   [place] bx 2:00, venus marker, path pending  . PORTACATH PLACEMENT Left 11/30/2019   Procedure: INSERTION PORT-A-CATH;   Surgeon: Robert Bellow, MD;  Location: [facility] ORS;  Service: General;  Laterality: Left;  . Right breast lumpectomy    . TONSILLECTOMY    . TUBAL LIGATION      Social History   Socioeconomic History  . Marital status: Married    Spouse name: Not on file  . Number of children: Not on file  . Years of education: Not on file  . Highest education level: Not on file  Occupational History  . Not on file  Tobacco Use  . Smoking status: Current Every Day Smoker    Packs/day: 0.50    Types: Cigarettes  . Smokeless tobacco: Never Used  Substance and Sexual Activity  . Alcohol use: No    Alcohol/week: 0.0 standard drinks  . Drug use: No    Comment: Marijuana a year ago 2020  . Sexual activity: Not on file  Other Topics Concern  . Not on file  Social History Narrative   Lives at home with husband.   Social Determinants of Health   Financial Resource Strain:   . Difficulty of Paying Living Expenses:   Food Insecurity:   . Worried About Charity fundraiser in the Last Year:   . Arboriculturist in the Last Year:   Transportation Needs:   . Film/video editor (Medical):   Marland Kitchen  Lack of Transportation (Non-Medical):   Physical Activity:   . Days of Exercise per Week:   . Minutes of Exercise per Session:   Stress:   . Feeling of Stress :   Social Connections:   . Frequency of Communication with Friends and Family:   . Frequency of Social Gatherings with Friends and Family:   . Attends Religious Services:   . Active Member of Clubs or Organizations:   . Attends Archivist Meetings:   Marland Kitchen Marital Status:   Intimate Partner Violence:   . Fear of Current or Ex-Partner:   . Emotionally Abused:   Marland Kitchen Physically Abused:   . Sexually Abused:     Family History  Problem Relation Age of Onset  . Depression Mother   . CAD Father   . Hypertension Father   . Lung cancer Father   . Breast cancer Maternal Grandmother      Current Outpatient Medications:  .   acetaminophen (TYLENOL) 500 MG tablet, Take 500-1,000 mg by mouth every 6 (six) hours as needed (for pain.)., Disp: , Rfl:  .  albuterol (VENTOLIN HFA) 108 (90 Base) MCG/ACT inhaler, Inhale 1-2 puffs into the lungs every 6 (six) hours as needed for wheezing or shortness of breath., Disp: , Rfl:  .  amLODipine (NORVASC) 10 MG tablet, Take 10 mg by mouth daily. , Disp: , Rfl:  .  lidocaine-prilocaine (EMLA) cream, Apply to affected area once, Disp: 30 g, Rfl: 3 .  Melatonin 5 MG TABS, Take 1 tablet by mouth at bedtime and may repeat dose one time if needed., Disp: , Rfl:  .  ondansetron (ZOFRAN) 8 MG tablet, Take 1 tablet (8 mg total) by mouth 2 (two) times daily as needed., Disp: 60 tablet, Rfl: 2 .  prochlorperazine (COMPAZINE) 10 MG tablet, Take 1 tablet (10 mg total) by mouth every 6 (six) hours as needed (Nausea or vomiting)., Disp: 60 tablet, Rfl: 2 .  Pseudoeph-Doxylamine-DM-APAP (NYQUIL PO), Take 1-2 capsules by mouth at bedtime as needed (sleep.). , Disp: , Rfl:  .  tetrahydrozoline 0.05 % ophthalmic solution, Place 1-2 drops into both eyes 3 (three) times daily as needed (dry/irritated eyes)., Disp: , Rfl:   Physical exam: There were no vitals filed for this visit. Limited due to virtual platform  CMP Latest Ref Rng & Units 12/02/2019  Glucose 70 - 99 mg/dL 95  BUN 6 - 20 mg/dL 16  Creatinine 0.44 - 1.00 mg/dL 0.73  Sodium 135 - 145 mmol/L 135  Potassium 3.5 - 5.1 mmol/L 3.7  Chloride 98 - 111 mmol/L 103  CO2 22 - 32 mmol/L 22  Calcium 8.9 - 10.3 mg/dL 9.1  Total Protein 6.5 - 8.1 g/dL 8.1  Total Bilirubin 0.3 - 1.2 mg/dL 0.3  Alkaline Phos 38 - 126 U/L 120  AST 15 - 41 U/L 17  ALT 0 - 44 U/L 15   CBC Latest Ref Rng & Units 12/02/2019  WBC 4.0 - 10.5 K/uL 11.6(H)  Hemoglobin 12.0 - 15.0 g/dL 14.5  Hematocrit 36.0 - 46.0 % 42.3  Platelets 150 - 400 K/uL 270    No images are attached to the encounter.  CT Chest W Contrast  Result Date: 12/01/2019 CLINICAL DATA:  Newly  diagnosed right breast cancer. Pretreatment staging. EXAM: CT CHEST, ABDOMEN, AND PELVIS WITH CONTRAST TECHNIQUE: Multidetector CT imaging of the chest, abdomen and pelvis was performed following the standard protocol during bolus administration of intravenous contrast. CONTRAST:  118m OMNIPAQUE IOHEXOL 300 MG/ML  SOLN COMPARISON:  Report only from abdominal CT 05/17/2004. FINDINGS: CT CHEST FINDINGS Cardiovascular: Mild atherosclerosis of the aorta, great vessels and coronary arteries. There is a left subclavian Port-A-Cath which extends to the lower SVC level. The heart size is normal. There is no pericardial effusion. Mediastinum/Nodes: There are asymmetric right axillary lymph nodes which are not pathologically enlarged, measuring up to 8 mm short axis on image 17/2. No enlarged mediastinal, hilar or internal mammary lymph nodes. The thyroid gland, trachea and esophagus demonstrate no significant findings. Lungs/Pleura: There is no pleural effusion. Moderate to severe centrilobular emphysema with mild biapical scarring. There is additional linear scarring or atelectasis at both lung bases. No suspicious nodularity. Musculoskeletal/Chest wall: Central right breast mass measuring up to 2.0 cm on image 27/2. No other chest wall mass or suspicious osseous findings. CT ABDOMEN AND PELVIS FINDINGS Hepatobiliary: There are several hepatic cysts, measuring up to 3.3 cm centrally in the left hepatic lobe on image 55/2. No suspicious hepatic findings. No evidence of gallstones, gallbladder wall thickening or biliary dilatation. Pancreas: Unremarkable. No pancreatic ductal dilatation or surrounding inflammatory changes. Spleen: Normal in size without focal abnormality. Adrenals/Urinary Tract: Both adrenal glands appear normal. Small bilateral renal cysts. No evidence of renal mass, urinary tract calculus or hydronephrosis. The bladder appears normal. Stomach/Bowel: No evidence of bowel wall thickening, distention or  surrounding inflammatory change. Mild sigmoid colon diverticular changes. Vascular/Lymphatic: There are no enlarged abdominal or pelvic lymph nodes. Aortic and branch vessel atherosclerosis. No acute vascular findings. Reproductive: The uterus and ovaries appear normal. Other: Intact anterior abdominal wall. No ascites or peritoneal nodularity. Musculoskeletal: No acute or significant osseous findings. Multilevel spondylosis with mild osseous foraminal narrowing bilaterally at L5-S1. There are sacral Tarlov cysts. IMPRESSION: 1. Central right breast mass consistent with primary breast cancer. There are asymmetric right axillary lymph nodes which are not pathologically enlarged. Patient has known right axillary nodal disease by previous biopsy 11/10/2019. 2. No other evidence of metastatic disease in the chest, abdomen or pelvis. 3. Hepatic and renal cysts. 4. Aortic Atherosclerosis (ICD10-I70.0) and Emphysema (ICD10-J43.9). Electronically Signed   By: Richardean Sale M.D.   On: 12/01/2019 13:24   NM Cardiac Muga Rest  Result Date: 11/26/2019 CLINICAL DATA:  RIGHT breast cancer, pre cardiotoxic chemotherapy EXAM: NUCLEAR MEDICINE CARDIAC BLOOD POOL IMAGING (MUGA) TECHNIQUE: Cardiac multi-gated acquisition was performed at rest following intravenous injection of Tc-44mlabeled red blood cells. RADIOPHARMACEUTICALS:  21.7 mCi Tc-966mertechnetate in-vitro labeled red blood cells IV COMPARISON:  None FINDINGS: Calculated LEFT ventricular ejection fraction is 60.8%, normal. Study was obtained at a cardiac rate of 74 bpm. Patient was rhythmic during imaging. Cine analysis of the LEFT ventricle in 3 projections demonstrates normal LEFT ventricular wall motion. IMPRESSION: Normal LEFT ventricular ejection fraction of 60.8% with normal LV wall motion. Electronically Signed   By: MaLavonia Dana.D.   On: 11/26/2019 16:18   CT Abdomen Pelvis W Contrast  Result Date: 12/01/2019 CLINICAL DATA:  Newly diagnosed right breast  cancer. Pretreatment staging. EXAM: CT CHEST, ABDOMEN, AND PELVIS WITH CONTRAST TECHNIQUE: Multidetector CT imaging of the chest, abdomen and pelvis was performed following the standard protocol during bolus administration of intravenous contrast. CONTRAST:  10070mMNIPAQUE IOHEXOL 300 MG/ML  SOLN COMPARISON:  Report only from abdominal CT 05/17/2004. FINDINGS: CT CHEST FINDINGS Cardiovascular: Mild atherosclerosis of the aorta, great vessels and coronary arteries. There is a left subclavian Port-A-Cath which extends to the lower SVC level. The heart size is normal. There is  no pericardial effusion. Mediastinum/Nodes: There are asymmetric right axillary lymph nodes which are not pathologically enlarged, measuring up to 8 mm short axis on image 17/2. No enlarged mediastinal, hilar or internal mammary lymph nodes. The thyroid gland, trachea and esophagus demonstrate no significant findings. Lungs/Pleura: There is no pleural effusion. Moderate to severe centrilobular emphysema with mild biapical scarring. There is additional linear scarring or atelectasis at both lung bases. No suspicious nodularity. Musculoskeletal/Chest wall: Central right breast mass measuring up to 2.0 cm on image 27/2. No other chest wall mass or suspicious osseous findings. CT ABDOMEN AND PELVIS FINDINGS Hepatobiliary: There are several hepatic cysts, measuring up to 3.3 cm centrally in the left hepatic lobe on image 55/2. No suspicious hepatic findings. No evidence of gallstones, gallbladder wall thickening or biliary dilatation. Pancreas: Unremarkable. No pancreatic ductal dilatation or surrounding inflammatory changes. Spleen: Normal in size without focal abnormality. Adrenals/Urinary Tract: Both adrenal glands appear normal. Small bilateral renal cysts. No evidence of renal mass, urinary tract calculus or hydronephrosis. The bladder appears normal. Stomach/Bowel: No evidence of bowel wall thickening, distention or surrounding inflammatory  change. Mild sigmoid colon diverticular changes. Vascular/Lymphatic: There are no enlarged abdominal or pelvic lymph nodes. Aortic and branch vessel atherosclerosis. No acute vascular findings. Reproductive: The uterus and ovaries appear normal. Other: Intact anterior abdominal wall. No ascites or peritoneal nodularity. Musculoskeletal: No acute or significant osseous findings. Multilevel spondylosis with mild osseous foraminal narrowing bilaterally at L5-S1. There are sacral Tarlov cysts. IMPRESSION: 1. Central right breast mass consistent with primary breast cancer. There are asymmetric right axillary lymph nodes which are not pathologically enlarged. Patient has known right axillary nodal disease by previous biopsy 11/10/2019. 2. No other evidence of metastatic disease in the chest, abdomen or pelvis. 3. Hepatic and renal cysts. 4. Aortic Atherosclerosis (ICD10-I70.0) and Emphysema (ICD10-J43.9). Electronically Signed   By: Richardean Sale M.D.   On: 12/01/2019 13:24   DG Chest Port 1 View  Result Date: 11/30/2019 CLINICAL DATA:  Follow-up Port-A-Cath placement. EXAM: PORTABLE CHEST 1 VIEW COMPARISON:  11/26/2019 FINDINGS: Port-A-Cath placed from a left subclavian approach. Tip is at the [facility] RA junction or just within the proximal right atrium. Patient has taken a poor inspiration. Allowing for that, the lungs are probably clear. Can not rule out an element of mild atelectasis or fluid overload, but favor findings are secondary to poor inspiration. No pneumothorax. IMPRESSION: Port-A-Cath tip at the SVC RA junction or just within the right atrium. No pneumothorax. Poor inspiration. See above. Electronically Signed   By: Nelson Chimes M.D.   On: 11/30/2019 11:09   DG C-Arm 1-60 Min-No Report  Result Date: 11/30/2019 Fluoroscopy was utilized by the requesting physician.  No radiographic interpretation.   MS CLIP PLACEMENT LEFT  Result Date: 11/10/2019 CLINICAL DATA:  Post ultrasound-guided biopsy of a  mass in the right breast at the 10 o'clock position, ultrasound-guided biopsy of an abnormal lymph node in the right axilla and ultrasound-guided biopsy of a mass in the left breast at the 2 o'clock position. EXAM: DIAGNOSTIC BILATERAL MAMMOGRAM POST ULTRASOUND BIOPSY COMPARISON:  Previous exam(s). FINDINGS: Mammographic images were obtained following ultrasound-guided biopsy of a mass in the right breast at the 10 o'clock position, ultrasound-guided biopsy of an abnormal lymph node in the right axilla and ultrasound-guided biopsy of a mass in the left breast at the 2 o'clock position. Heart shaped biopsy marking clip is present at the site of the biopsied mass in the right breast at the 10 o'clock  position. The biopsied lymph node in the right axilla could not be included in the field of view. A venous shaped biopsy marking clip is present at the site of the biopsied mass in the left breast at the 2 o'clock position. IMPRESSION: 1. Heart shaped biopsy marking clip at site of biopsied mass in the right breast at the 10 o'clock position. 2. Biopsied lymph node could not be included in the field of view on the post biopsy mammogram. 3. Venus shaped biopsy marking clip at site of biopsied mass in the left breast at the 2 o'clock position. Final Assessment: Post Procedure Mammograms for Marker Placement Electronically Signed   By: Everlean Alstrom M.D.   On: 11/10/2019 09:40   MS CLIP PLACEMENT RIGHT  Result Date: 11/10/2019 CLINICAL DATA:  Post ultrasound-guided biopsy of a mass in the right breast at the 10 o'clock position, ultrasound-guided biopsy of an abnormal lymph node in the right axilla and ultrasound-guided biopsy of a mass in the left breast at the 2 o'clock position. EXAM: DIAGNOSTIC BILATERAL MAMMOGRAM POST ULTRASOUND BIOPSY COMPARISON:  Previous exam(s). FINDINGS: Mammographic images were obtained following ultrasound-guided biopsy of a mass in the right breast at the 10 o'clock position,  ultrasound-guided biopsy of an abnormal lymph node in the right axilla and ultrasound-guided biopsy of a mass in the left breast at the 2 o'clock position. Heart shaped biopsy marking clip is present at the site of the biopsied mass in the right breast at the 10 o'clock position. The biopsied lymph node in the right axilla could not be included in the field of view. A venous shaped biopsy marking clip is present at the site of the biopsied mass in the left breast at the 2 o'clock position. IMPRESSION: 1. Heart shaped biopsy marking clip at site of biopsied mass in the right breast at the 10 o'clock position. 2. Biopsied lymph node could not be included in the field of view on the post biopsy mammogram. 3. Venus shaped biopsy marking clip at site of biopsied mass in the left breast at the 2 o'clock position. Final Assessment: Post Procedure Mammograms for Marker Placement Electronically Signed   By: Everlean Alstrom M.D.   On: 11/10/2019 09:40   [place] LT BREAST BX W LOC DEV 1ST LESION IMG BX SPEC US GUIDE  Addendum Date: 11/17/2019   ADDENDUM REPORT: 11/13/2019 08:48 ADDENDUM: Pathology revealed GRADE III INVASIVE MAMMARY CARCINOMA of the RIGHT breast, 10:00 2cmfn. This was found to be concordant by Dr. Everlean Alstrom. 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 This was found to be concordant by Dr. Everlean Alstrom. Pathology revealed FIBROADENOMA of the LEFT breast, 2:00 3cmfn. This was found to be concordant by Dr. Everlean Alstrom. Pathology results were discussed with the patient by telephone. The patient reported doing well after the biopsies with tenderness at the sites. Post biopsy instructions and care were reviewed and questions were answered. The patient was encouraged to call [facility] for any additional concerns. Al Pimple RN Nurse Navigator and BCCCP Coordinator has arranged appointment with Dr. Delight Hoh of [facility] Pathology results reported by Stacie Acres RN on 11/13/2019. Electronically Signed   By: Everlean Alstrom M.D.   On: 11/13/2019 08:48   Result Date: 11/17/2019 CLINICAL DATA:  54 year old female with a suspicious mass in the right breast at the 10 o'clock position. Abnormal lymph node in the right axilla, and indeterminate mass  in the left breast at the 2 o'clock position. EXAM: ULTRASOUND GUIDED RIGHT BREAST AND RIGHT AXILLA CORE NEEDLE BIOPSY ULTRASOUND GUIDED LEFT BREAST CORE NEEDLE BIOPSY COMPARISON:  Previous exam(s). PROCEDURE: I met with the patient and we discussed the procedure of ultrasound-guided biopsy, including benefits and alternatives. We discussed the high likelihood of a successful procedure. We discussed the risks of the procedure, including infection, bleeding, tissue injury, clip migration, and inadequate sampling. Informed written consent was given. The usual time-out protocol was performed immediately prior to the procedure. RIGHT BREAST 10 O'CLOCK: MASS: Lesion quadrant: UPPER-OUTER Using sterile technique and 1% Lidocaine as local anesthetic, under direct ultrasound visualization, a 14 gauge spring-loaded device was used to perform biopsy of the mass in the right breast at the 10 o'clock position using a lateral to medial approach. At the conclusion of the procedure a heart shaped tissue marker clip was deployed into the biopsy cavity. Follow up 2 view mammogram was performed and dictated separately. RIGHT AXILLA: LYMPH NODE: Lesion quadrant: UPPER-OUTER Using sterile technique and 1% Lidocaine as local anesthetic, under direct ultrasound visualization, a 14 gauge spring-loaded device was used to perform biopsy of the morphologically abnormal lymph node in the right axilla using a inferior to superior approach. At the conclusion of the procedure a HydroMARK tissue marker clip was deployed into the biopsy cavity. Follow up 2 view mammogram was  performed and dictated separately. LEFT BREAST 2 O'CLOCK: MASS: Lesion quadrant: UPPER-OUTER Using sterile technique and 1% Lidocaine as local anesthetic, under direct ultrasound visualization, a 14 gauge spring-loaded device was used to perform biopsy of the mass in the left breast at the 2 o'clock position using a lateral to medial approach. At the conclusion of the procedure a venous shaped tissue marker clip was deployed into the biopsy cavity. Follow up 2 view mammogram was performed and dictated separately. IMPRESSION: 1. Ultrasound-guided biopsy of the mass in the right breast at the 10 o'clock position. 2.  Ultrasound-guided biopsy of the lymph node in the right axilla. 3. Ultrasound-guided biopsy of the mass in the left breast at the 2 o'clock position. Electronically Signed: By: Everlean Alstrom M.D. 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 This was found to be concordant by Dr. Everlean Alstrom. 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 This was found to be concordant by Dr. Everlean Alstrom. Pathology revealed FIBROADENOMA of the LEFT breast, 2:00 3cmfn. This was found to be concordant by Dr. Everlean Alstrom. Pathology results were discussed with the patient by telephone. The patient reported doing well after the biopsies with tenderness at the sites. Post biopsy instructions and care were reviewed and questions were answered. The patient was encouraged to call [facility] for any additional concerns. Al Pimple RN Nurse Navigator and BCCCP Coordinator has arranged appointment with Dr. Delight Hoh of [facility]. Pathology results reported by Stacie Acres RN on 11/13/2019. Electronically  Signed   By: Everlean Alstrom M.D.   On: 11/13/2019 08:48   Result Date: 11/17/2019 CLINICAL DATA:  54 year old female with a suspicious mass in the right breast at the 10 o'clock position. Abnormal lymph node in the right axilla, and indeterminate mass in the left breast at the 2 o'clock  position. EXAM: ULTRASOUND GUIDED RIGHT BREAST AND RIGHT AXILLA CORE NEEDLE BIOPSY ULTRASOUND GUIDED LEFT BREAST CORE NEEDLE BIOPSY COMPARISON:  Previous exam(s). PROCEDURE: I met with the patient and we discussed the procedure of ultrasound-guided biopsy, including benefits and alternatives. We discussed the high likelihood of a successful procedure. We discussed the risks of the procedure, including infection, bleeding, tissue injury, clip migration, and inadequate sampling. Informed written consent was given. The usual time-out protocol was performed immediately prior to the procedure. RIGHT BREAST 10 O'CLOCK: MASS: Lesion quadrant: UPPER-OUTER Using sterile technique and 1% Lidocaine as local anesthetic, under direct ultrasound visualization, a 14 gauge spring-loaded device was used to perform biopsy of the mass in the right breast at the 10 o'clock position using a lateral to medial approach. At the conclusion of the procedure a heart shaped tissue marker clip was deployed into the biopsy cavity. Follow up 2 view mammogram was performed and dictated separately. RIGHT AXILLA: LYMPH NODE: Lesion quadrant: UPPER-OUTER Using sterile technique and 1% Lidocaine as local anesthetic, under direct ultrasound visualization, a 14 gauge spring-loaded device was used to perform biopsy of the morphologically abnormal lymph node in the right axilla using a inferior to superior approach. At the conclusion of the procedure a HydroMARK tissue marker clip was deployed into the biopsy cavity. Follow up 2 view mammogram was performed and dictated separately. LEFT BREAST 2 O'CLOCK: MASS: Lesion quadrant: UPPER-OUTER Using sterile technique and 1%  Lidocaine as local anesthetic, under direct ultrasound visualization, a 14 gauge spring-loaded device was used to perform biopsy of the mass in the left breast at the 2 o'clock position using a lateral to medial approach. At the conclusion of the procedure a venous shaped tissue marker clip was deployed into the biopsy cavity. Follow up 2 view mammogram was performed and dictated separately. IMPRESSION: 1. Ultrasound-guided biopsy of the mass in the right breast at the 10 o'clock position. 2.  Ultrasound-guided biopsy of the lymph node in the right axilla. 3. Ultrasound-guided biopsy of the mass in the left breast at the 2 o'clock position. Electronically Signed: By: Everlean Alstrom M.D. 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 This was found to be concordant by Dr. Everlean Alstrom. 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 This was found to be concordant by Dr. Everlean Alstrom. Pathology revealed FIBROADENOMA of the LEFT breast, 2:00 3cmfn. This was found to be concordant by Dr. Everlean Alstrom. Pathology results were discussed with the patient by telephone. The patient reported doing well after the biopsies with tenderness at the sites. Post biopsy instructions and care were reviewed and questions were answered. The patient was encouraged to call [facility] for any additional concerns. Al Pimple RN Nurse Navigator and BCCCP Coordinator has arranged appointment with Dr. Delight Hoh of [facility]. Pathology results reported by Stacie Acres RN on 11/13/2019. Electronically Signed   By: Everlean Alstrom M.D.   On: 11/13/2019 08:48   Result Date: 11/17/2019 CLINICAL DATA:  54 year old female  with a suspicious mass in the right breast at the 10 o'clock position. Abnormal lymph node in the right axilla, and indeterminate mass in the left breast at the 2 o'clock position. EXAM: ULTRASOUND GUIDED RIGHT BREAST AND  RIGHT AXILLA CORE NEEDLE BIOPSY ULTRASOUND GUIDED LEFT BREAST CORE NEEDLE BIOPSY COMPARISON:  Previous exam(s). PROCEDURE: I met with the patient and we discussed the procedure of ultrasound-guided biopsy, including benefits and alternatives. We discussed the high likelihood of a successful procedure. We discussed the risks of the procedure, including infection, bleeding, tissue injury, clip migration, and inadequate sampling. Informed written consent was given. The usual time-out protocol was performed immediately prior to the procedure. RIGHT BREAST 10 O'CLOCK: MASS: Lesion quadrant: UPPER-OUTER Using sterile technique and 1% Lidocaine as local anesthetic, under direct ultrasound visualization, a 14 gauge spring-loaded device was used to perform biopsy of the mass in the right breast at the 10 o'clock position using a lateral to medial approach. At the conclusion of the procedure a heart shaped tissue marker clip was deployed into the biopsy cavity. Follow up 2 view mammogram was performed and dictated separately. RIGHT AXILLA: LYMPH NODE: Lesion quadrant: UPPER-OUTER Using sterile technique and 1% Lidocaine as local anesthetic, under direct ultrasound visualization, a 14 gauge spring-loaded device was used to perform biopsy of the morphologically abnormal lymph node in the right axilla using a inferior to superior approach. At the conclusion of the procedure a HydroMARK tissue marker clip was deployed into the biopsy cavity. Follow up 2 view mammogram was performed and dictated separately. LEFT BREAST 2 O'CLOCK: MASS: Lesion quadrant: UPPER-OUTER Using sterile technique and 1% Lidocaine as local anesthetic, under direct ultrasound visualization, a 14 gauge spring-loaded device was used to  perform biopsy of the mass in the left breast at the 2 o'clock position using a lateral to medial approach. At the conclusion of the procedure a venous shaped tissue marker clip was deployed into the biopsy cavity. Follow up 2 view mammogram was performed and dictated separately. IMPRESSION: 1. Ultrasound-guided biopsy of the mass in the right breast at the 10 o'clock position. 2.  Ultrasound-guided biopsy of the lymph node in the right axilla. 3. Ultrasound-guided biopsy of the mass in the left breast at the 2 o'clock position. Electronically Signed: By: Everlean Alstrom M.D. On: 11/10/2019 09:38     Assessment and plan- Patient is a 54 y.o. female who presents to [facility] for initial meeting in preparation for starting chemotherapy for the treatment of breast cancer.   1. HPI: Patient presented back in early March 2021 for a right breast mass to her PCP prompting imaging and biopsy which revealed stage IIIa ER/PR positive HER-2/neu equivocal of right breast.  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  She underwent a lumpectomy and had radiation but did not receive adjuvant tamoxifen at that time.  Mrs. Ellwood received her first cycle of AC yesterday.  She notes extreme fatigue with mild diarrhea and some nausea.  Overall stable.  She knows to call our clinic with any concerns.  2. Chemo Care Clinic/High Risk for ER/Hospitalization during chemotherapy- We discussed the role of the chemo care clinic and identified patient specific risk factors. I discussed that patient was identified as high risk primarily based on: Comorbidities and recurrence  Patient has past medical history positive for: Past Medical History:  Diagnosis Date  . Brain tumor (benign) ([place])   . Breast cancer ([place]) 1999   Status post right breast lumpectomy and radiation  . COPD (chronic obstructive pulmonary disease) ([place])   . Depression   . History of chemotherapy 1999  . History of seizure  disorder   . Hypertension   . Personal history of  radiation therapy 1999  . Pneumonia     Patient has past surgical history positive for: Past Surgical History:  Procedure Laterality Date  . BRAIN SURGERY    . Brain tumor resection  1991  . BREAST BIOPSY Right 1999   Positive  . BREAST BIOPSY Right 11/10/2019   [place] bx 10:00 heart marker, path pending  . BREAST BIOPSY Right 11/10/2019   [place] bx axilla, hydromark 4, path pending  . BREAST BIOPSY Left 11/10/2019   [place] bx 2:00, venus marker, path pending  . PORTACATH PLACEMENT Left 11/30/2019   Procedure: INSERTION PORT-A-CATH;  Surgeon: Robert Bellow, MD;  Location: [facility] ORS;  Service: General;  Laterality: Left;  . Right breast lumpectomy    . TONSILLECTOMY    . TUBAL LIGATION      Based on our high risk symptom management report; this patient has a high risk of ED utilization.  The percentage below indicates how "at risk "  this patient based on the factors in this table within one year.   General Risk Score: 5  Values used to calculate this score:   Points  Metrics      0        Age: 41      1        Hospital Admissions: 1      3        ED Visits: 10      0        Has Chronic Obstructive Pulmonary Disease: No      0        Has Diabetes: No      0        Has Congestive Heart Failure: No      0        Has liver disease: No      0        Has Depression: No      0        Current PCP: No Pcp Per Patient      1        Has Medicaid: Yes   3. We discussed that social determinants of health may have significant impacts on health and outcomes for cancer patients.  Today we discussed specific social determinants of performance status, alcohol use, depression, financial needs, food insecurity, housing, interpersonal violence, social connections, stress, tobacco use, and transportation.    After lengthy discussion the following were identified as areas of need: None at this time.   Outpatient services: We discussed options including  home based and outpatient services, DME and care program. We discusssed that patients who participate in regular physical activity report fewer negative impacts of cancer and treatments and report less fatigue.   Financial Concerns: We discussed that living with cancer can create tremendous financial burden.  We discussed options for assistance. I asked that if assistance is needed in affording medications or paying bills to please let us know so that we can provide assistance. We discussed options for food including social services, [employer] ($50 every 2 weeks) and onsite food pantry.  We will also notify [facility] to see if cancer center can provide additional support.  Referral to Social work: Introduced Education officer, museum Elease Etienne and the services he can provide such as support with [employer], cell phone and gas vouchers.   Support groups: We discussed options for support groups at the cancer center. If interested, please notify nurse navigator to enroll. We  discussed options for managing stress including healthy eating, exercise as well as participating in no charge counseling services at the cancer center and support groups.  If these are of interest, patient can notify either myself or primary nursing team.We discussed options for management including medications and referral to quit Smart program  Transportation: We discussed options for transportation including acta, paratransit, bus routes, link transit, taxi/uber/lyft, and cancer center [facility].  I have notified primary oncology team who will help assist with arranging Lucianne Lei transportation for appointments when/if needed. We also discussed options for transportation on short notice/acute visits.  Palliative care services: We have palliative care services available in the cancer center to discuss goals of care and advanced care planning.  Please let us know if you have any questions or would like to speak to our palliative nurse  practitioner.  Symptom Management Clinic: We discussed our symptom management clinic which is available for acute concerns while receiving treatment such as nausea, vomiting or diarrhea.  We can be reached via telephone at [phone] or through my chart.  We are available for virtual or in person visits on the same day from 830 to 4 PM Monday through Friday. She denies needing specific assistance at this time and She will be followed by Dr. Gary Fleet clinic team.   Plan: Discussed symptom management clinic. Discussed palliative care services. Discussed resources that are available here at the cancer center. Discussed medications and new prescriptions to begin treatment such as anti-nausea or steroids.   Disposition: RTC on 12/08/19 for labs only.  RTC on 12/16/19 for md assessment and cycle 2 AC.   Visit Diagnosis 1. Primary cancer of upper outer quadrant of right female breast [facility])     Patient expressed understanding and was in agreement with this plan. She also understands that She can call clinic at any time with any questions, concerns, or complaints.   I provided 15 minutes of non face-to-face telephone visit time during this encounter, and > 50% was spent counseling as documented under my assessment & plan.   [place] [facility] [place]  CC: Dr. Grayland Ormond

## 2019-12-03 NOTE — Progress Notes (Signed)
Patient ID: Kelsey Russo, female   DOB: 1966/05/01, 54 y.o.   MRN: [id number] Patient experiencing some mild nausea, and diarrhea.  Taking anti emetics.  Encouraged to take imodium if diarrhea continues.  Per Dr. Grayland Ormond patient can take COVID vaccine after completion of 4 cycles of Adriamycin,Cytoxin regimen of chemotherapy.

## 2019-12-04 ENCOUNTER — Other Ambulatory Visit: Payer: Self-pay

## 2019-12-04 ENCOUNTER — Inpatient Hospital Stay: Payer: Medicaid Other

## 2019-12-04 DIAGNOSIS — F419 Anxiety disorder, unspecified: Secondary | ICD-10-CM | POA: Diagnosis not present

## 2019-12-04 DIAGNOSIS — Z5189 Encounter for other specified aftercare: Secondary | ICD-10-CM | POA: Diagnosis not present

## 2019-12-04 DIAGNOSIS — C50411 Malignant neoplasm of upper-outer quadrant of right female breast: Secondary | ICD-10-CM | POA: Diagnosis present

## 2019-12-04 DIAGNOSIS — Z801 Family history of malignant neoplasm of trachea, bronchus and lung: Secondary | ICD-10-CM | POA: Diagnosis not present

## 2019-12-04 DIAGNOSIS — F17210 Nicotine dependence, cigarettes, uncomplicated: Secondary | ICD-10-CM | POA: Diagnosis not present

## 2019-12-04 DIAGNOSIS — Z170 Estrogen receptor positive status [ER+]: Secondary | ICD-10-CM | POA: Diagnosis not present

## 2019-12-04 DIAGNOSIS — R519 Headache, unspecified: Secondary | ICD-10-CM | POA: Diagnosis not present

## 2019-12-04 DIAGNOSIS — J449 Chronic obstructive pulmonary disease, unspecified: Secondary | ICD-10-CM | POA: Diagnosis not present

## 2019-12-04 DIAGNOSIS — Z5111 Encounter for antineoplastic chemotherapy: Secondary | ICD-10-CM | POA: Diagnosis present

## 2019-12-04 DIAGNOSIS — G4700 Insomnia, unspecified: Secondary | ICD-10-CM | POA: Diagnosis not present

## 2019-12-04 DIAGNOSIS — E876 Hypokalemia: Secondary | ICD-10-CM | POA: Diagnosis not present

## 2019-12-04 DIAGNOSIS — D72829 Elevated white blood cell count, unspecified: Secondary | ICD-10-CM | POA: Diagnosis not present

## 2019-12-04 DIAGNOSIS — Z803 Family history of malignant neoplasm of breast: Secondary | ICD-10-CM | POA: Diagnosis not present

## 2019-12-04 MED ORDER — PEGFILGRASTIM-CBQV 6 MG/0.6ML ~~LOC~~ SOSY
6.0000 mg | PREFILLED_SYRINGE | Freq: Once | SUBCUTANEOUS | Status: AC
  Administered 2019-12-04: 6 mg via SUBCUTANEOUS
  Filled 2019-12-04: qty 0.6

## 2019-12-07 ENCOUNTER — Encounter: Payer: Self-pay | Admitting: Oncology

## 2019-12-07 NOTE — Progress Notes (Signed)
Patient states her only concern is she has not been able to sleep since she started treatment. She was taking melatonin but is not helping.

## 2019-12-08 ENCOUNTER — Inpatient Hospital Stay (HOSPITAL_BASED_OUTPATIENT_CLINIC_OR_DEPARTMENT_OTHER): Payer: Medicaid Other | Admitting: Oncology

## 2019-12-08 ENCOUNTER — Other Ambulatory Visit: Payer: Self-pay

## 2019-12-08 ENCOUNTER — Inpatient Hospital Stay: Payer: Medicaid Other

## 2019-12-08 ENCOUNTER — Other Ambulatory Visit: Payer: Self-pay | Admitting: *Deleted

## 2019-12-08 VITALS — BP 144/94 | HR 96 | Temp 97.9°F | Resp 20 | Wt 166.7 lb

## 2019-12-08 DIAGNOSIS — C50411 Malignant neoplasm of upper-outer quadrant of right female breast: Secondary | ICD-10-CM

## 2019-12-08 DIAGNOSIS — Z5111 Encounter for antineoplastic chemotherapy: Secondary | ICD-10-CM | POA: Diagnosis not present

## 2019-12-08 LAB — COMPREHENSIVE METABOLIC PANEL
ALT: 10 U/L (ref 0–44)
AST: 16 U/L (ref 15–41)
Albumin: 4.1 g/dL (ref 3.5–5.0)
Alkaline Phosphatase: 144 U/L — ABNORMAL HIGH (ref 38–126)
Anion gap: 9 (ref 5–15)
BUN: 13 mg/dL (ref 6–20)
CO2: 24 mmol/L (ref 22–32)
Calcium: 9.1 mg/dL (ref 8.9–10.3)
Chloride: 102 mmol/L (ref 98–111)
Creatinine, Ser: 0.61 mg/dL (ref 0.44–1.00)
GFR calc Af Amer: >60 mL/min (ref >60)
GFR calc non Af Amer: >60 mL/min (ref >60)
Glucose, Bld: 110 mg/dL — ABNORMAL HIGH (ref 70–99)
Potassium: 3.4 mmol/L — ABNORMAL LOW (ref 3.5–5.1)
Sodium: 135 mmol/L (ref 135–145)
Total Bilirubin: 0.5 mg/dL (ref 0.3–1.2)
Total Protein: 7.5 g/dL (ref 6.5–8.1)

## 2019-12-08 LAB — CBC WITH DIFFERENTIAL/PLATELET
Abs Immature Granulocytes: 0.1 10*3/uL — ABNORMAL HIGH (ref 0.00–0.07)
Band Neutrophils: 6 %
Basophils Absolute: 0.1 10*3/uL (ref 0.0–0.1)
Basophils Relative: 1 %
Eosinophils Absolute: 0.4 10*3/uL (ref 0.0–0.5)
Eosinophils Relative: 3 %
HCT: 41.7 % (ref 36.0–46.0)
Hemoglobin: 14.2 g/dL (ref 12.0–15.0)
Lymphocytes Relative: 10 %
Lymphs Abs: 1.2 10*3/uL (ref 0.7–4.0)
MCH: 31 pg (ref 26.0–34.0)
MCHC: 34.1 g/dL (ref 30.0–36.0)
MCV: 91 fL (ref 80.0–100.0)
Metamyelocytes Relative: 1 %
Monocytes Absolute: 0.2 10*3/uL (ref 0.1–1.0)
Monocytes Relative: 2 %
Neutro Abs: 10 10*3/uL — ABNORMAL HIGH (ref 1.7–7.7)
Neutrophils Relative %: 77 %
Platelets: 194 10*3/uL (ref 150–400)
RBC: 4.58 MIL/uL (ref 3.87–5.11)
RDW: 12 % (ref 11.5–15.5)
Smear Review: NORMAL
WBC: 12 10*3/uL — ABNORMAL HIGH (ref 4.0–10.5)
nRBC: 0 % (ref 0.0–0.2)

## 2019-12-08 MED ORDER — ALPRAZOLAM 0.25 MG PO TABS: 0.2500 mg | ORAL_TABLET | Freq: Every evening | ORAL | 0 refills | Status: DC | PRN

## 2019-12-08 NOTE — Telephone Encounter (Signed)
Prescription printed and did not go to pharmacy

## 2019-12-09 NOTE — Progress Notes (Signed)
Pharmacist Chemotherapy Monitoring - Follow Up Assessment    I verify that I have reviewed each item in the below checklist:  . Regimen for the patient is scheduled for the appropriate day and plan matches scheduled date. Marland Kitchen Appropriate non-routine labs are ordered dependent on drug ordered. . If applicable, additional medications reviewed and ordered per protocol based on lifetime cumulative doses and/or treatment regimen.   Plan for follow-up and/or issues identified: No . I-vent associated with next due treatment: No . MD and/or nursing notified: No  Kelsey Russo K 12/09/2019 8:42 AM

## 2019-12-11 NOTE — Progress Notes (Signed)
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  INTERVAL HISTORY: Patient returns to clinic today for further evaluation and consideration of cycle 2 of Adriamycin and Cytoxan.  She has dysphagia, but otherwise feels well.  She continues to have insomnia.  She has no neurologic complaints.  She denies any recent fevers or illnesses.  She has a good appetite and denies weight loss.  She has no chest pain, shortness of breath, cough, or hemoptysis.  She denies any nausea, vomiting, constipation, or diarrhea.  She has no urinary complaints.  Patient offers no further specific complaints today.  REVIEW OF SYSTEMS:   Review of Systems  Constitutional: Negative.  Negative for fever, malaise/fatigue and weight loss.  HENT: Positive for sore throat.   Respiratory: Negative.  Negative for cough, hemoptysis and shortness of breath.   Cardiovascular: Negative.  Negative for chest pain and leg swelling.  Gastrointestinal: Negative.  Negative for abdominal pain.  Genitourinary: Negative.  Negative for dysuria.  Musculoskeletal: Negative.  Negative for back pain.  Skin: Negative.  Negative for rash.  Neurological: Negative for dizziness, focal weakness, weakness and headaches.  Psychiatric/Behavioral: The patient is nervous/anxious and has insomnia.     As per HPI. Otherwise, a complete review of systems is negative.  PAST MEDICAL HISTORY: Past Medical History:  Diagnosis Date   Brain tumor (benign) ([place])    Breast cancer ([place]) 1999   Status post right breast lumpectomy and radiation   COPD (chronic  obstructive pulmonary disease) ([place])    Depression    History of chemotherapy 1999   History of seizure disorder    Hypertension    Personal history of radiation therapy 1999   Pneumonia     PAST SURGICAL HISTORY: Past Surgical History:  Procedure Laterality Date   BRAIN SURGERY     Brain tumor resection  1991   BREAST BIOPSY Right 1999   Positive   BREAST BIOPSY Right 11/10/2019   [place] bx 10:00 heart marker, path pending   BREAST BIOPSY Right 11/10/2019   [place] bx axilla, hydromark 4, path pending   BREAST BIOPSY Left 11/10/2019   [place] bx 2:00, venus marker, path pending   PORTACATH PLACEMENT Left 11/30/2019   Procedure: INSERTION PORT-A-CATH;  Surgeon: Robert Bellow, MD;  Location: [facility] ORS;  Service: General;  Laterality: Left;   Right breast lumpectomy     TONSILLECTOMY     TUBAL LIGATION      FAMILY HISTORY: Family History  Problem Relation Age of Onset   Depression Mother    CAD Father    Hypertension Father    Lung cancer Father    Breast cancer Maternal Grandmother     ADVANCED DIRECTIVES (Y/N):  N  HEALTH MAINTENANCE: Social History   Tobacco Use   Smoking status: Current Every Day Smoker    Packs/day: 0.50    Types: Cigarettes   Smokeless tobacco: Never Used  Substance Use Topics   Alcohol use: No    Alcohol/week: 0.0 standard drinks   Drug use: No    Comment: Marijuana a year ago 2020     Colonoscopy:  PAP:  Bone density:  Lipid panel:  Allergies  Allergen Reactions   Imitrex [Sumatriptan] Anaphylaxis   Penicillins Rash    Did it involve swelling of the face/tongue/throat, SOB, or low BP? Unknown Did it involve sudden or severe rash/hives, skin peeling, or any reaction on the inside of your mouth or nose? Unknown Did you need to seek medical attention at a hospital or doctor's office? Unknown When did it last happen?? If all above answers are NO, may proceed with cephalosporin use.     Current Outpatient  Medications  Medication Sig Dispense Refill   acetaminophen (TYLENOL) 500 MG tablet Take 500-1,000 mg by mouth every 6 (six) hours as needed (for pain.).     albuterol (VENTOLIN HFA) 108 (90 Base) MCG/ACT inhaler Inhale 1-2 puffs into the lungs every 6 (six) hours as needed for wheezing or shortness of breath.     ALPRAZolam (XANAX) 0.25 MG tablet Take 1 tablet (0.25 mg total) by mouth at bedtime as needed for anxiety. 30 tablet 0   amLODipine (NORVASC) 10 MG tablet Take 10 mg by mouth daily.      lidocaine-prilocaine (EMLA) cream Apply to affected area once 30 g 3   Melatonin 5 MG TABS Take 1 tablet by mouth at bedtime and may repeat dose one time if needed.     ondansetron (ZOFRAN) 8 MG tablet Take 1 tablet (8 mg total) by mouth 2 (two) times daily as needed. 60 tablet 2   prochlorperazine (COMPAZINE) 10 MG tablet Take 1 tablet (10 mg total) by mouth every 6 (six) hours as needed (Nausea or vomiting). 60 tablet 2   Pseudoeph-Doxylamine-DM-APAP (NYQUIL PO) Take 1-2 capsules by mouth at bedtime as needed (sleep.).      tetrahydrozoline 0.05 % ophthalmic solution Place 1-2 drops into both eyes 3 (three) times daily as needed (dry/irritated eyes).     nystatin (MYCOSTATIN) 100000 UNIT/ML suspension Use as directed 5 mLs (500,000 Units total) in the mouth or throat 4 (four) times daily. Swish and swallow 43ms 4 times daily 60 mL 0   No current facility-administered medications for this visit.    OBJECTIVE: Vitals:   12/16/19 0937  BP: 118/78  Pulse: 81  Resp: 18  Temp: (!) 95.1 F (35.1 C)  SpO2: 100%     Body mass index is 26.27 kg/m.    ECOG FS:0 - Asymptomatic  General: Well-developed, well-nourished, no acute distress. Eyes: Pink conjunctiva, anicteric sclera. HEENT: Normocephalic, moist mucous membranes.  Thrush noted on oropharynx. Lungs: No audible wheezing or coughing. Heart: Regular rate and rhythm. Abdomen: Soft, nontender, no obvious distention. Musculoskeletal:  No edema, cyanosis, or clubbing. Neuro: Alert, answering all questions appropriately. Cranial nerves grossly intact. Skin: No rashes or petechiae noted. Psych: Normal affect.   LAB RESULTS:  Lab Results  Component Value Date   NA 136 12/16/2019   K 4.2 12/16/2019   CL 103 12/16/2019   CO2 23 12/16/2019   GLUCOSE 91 12/16/2019   BUN 13 12/16/2019   CREATININE 0.77 12/16/2019   CALCIUM 9.5 12/16/2019   PROT 7.4 12/16/2019   ALBUMIN 4.1 12/16/2019   AST 18 12/16/2019   ALT 18 12/16/2019   ALKPHOS 131 (H) 12/16/2019   BILITOT 0.4 12/16/2019   GFRNONAA >60 12/16/2019   GFRAA >60 12/16/2019    Lab Results  Component Value Date   WBC 16.3 (H) 12/16/2019   NEUTROABS 10.9 (H) 12/16/2019   HGB 13.6 12/16/2019   HCT 39.9 12/16/2019   MCV 89.7 12/16/2019   PLT 233  12/16/2019     STUDIES: CT Chest W Contrast  Result Date: 12/01/2019 CLINICAL DATA:  Newly diagnosed right breast cancer. Pretreatment staging. EXAM: CT CHEST, ABDOMEN, AND PELVIS WITH CONTRAST TECHNIQUE: Multidetector CT imaging of the chest, abdomen and pelvis was performed following the standard protocol during bolus administration of intravenous contrast. CONTRAST:  138m OMNIPAQUE IOHEXOL 300 MG/ML  SOLN COMPARISON:  Report only from abdominal CT 05/17/2004. FINDINGS: CT CHEST FINDINGS Cardiovascular: Mild atherosclerosis of the aorta, great vessels and coronary arteries. There is a left subclavian Port-A-Cath which extends to the lower SVC level. The heart size is normal. There is no pericardial effusion. Mediastinum/Nodes: There are asymmetric right axillary lymph nodes which are not pathologically enlarged, measuring up to 8 mm short axis on image 17/2. No enlarged mediastinal, hilar or internal mammary lymph nodes. The thyroid gland, trachea and esophagus demonstrate no significant findings. Lungs/Pleura: There is no pleural effusion. Moderate to severe centrilobular emphysema with mild biapical scarring. There is  additional linear scarring or atelectasis at both lung bases. No suspicious nodularity. Musculoskeletal/Chest wall: Central right breast mass measuring up to 2.0 cm on image 27/2. No other chest wall mass or suspicious osseous findings. CT ABDOMEN AND PELVIS FINDINGS Hepatobiliary: There are several hepatic cysts, measuring up to 3.3 cm centrally in the left hepatic lobe on image 55/2. No suspicious hepatic findings. No evidence of gallstones, gallbladder wall thickening or biliary dilatation. Pancreas: Unremarkable. No pancreatic ductal dilatation or surrounding inflammatory changes. Spleen: Normal in size without focal abnormality. Adrenals/Urinary Tract: Both adrenal glands appear normal. Small bilateral renal cysts. No evidence of renal mass, urinary tract calculus or hydronephrosis. The bladder appears normal. Stomach/Bowel: No evidence of bowel wall thickening, distention or surrounding inflammatory change. Mild sigmoid colon diverticular changes. Vascular/Lymphatic: There are no enlarged abdominal or pelvic lymph nodes. Aortic and branch vessel atherosclerosis. No acute vascular findings. Reproductive: The uterus and ovaries appear normal. Other: Intact anterior abdominal wall. No ascites or peritoneal nodularity. Musculoskeletal: No acute or significant osseous findings. Multilevel spondylosis with mild osseous foraminal narrowing bilaterally at L5-S1. There are sacral Tarlov cysts. IMPRESSION: 1. Central right breast mass consistent with primary breast cancer. There are asymmetric right axillary lymph nodes which are not pathologically enlarged. Patient has known right axillary nodal disease by previous biopsy 11/10/2019. 2. No other evidence of metastatic disease in the chest, abdomen or pelvis. 3. Hepatic and renal cysts. 4. Aortic Atherosclerosis (ICD10-I70.0) and Emphysema (ICD10-J43.9). Electronically Signed   By: WRichardean SaleM.D.   On: 12/01/2019 13:24   NM Cardiac Muga Rest  Result Date:  11/26/2019 CLINICAL DATA:  RIGHT breast cancer, pre cardiotoxic chemotherapy EXAM: NUCLEAR MEDICINE CARDIAC BLOOD POOL IMAGING (MUGA) TECHNIQUE: Cardiac multi-gated acquisition was performed at rest following intravenous injection of Tc-91mabeled red blood cells. RADIOPHARMACEUTICALS:  21.7 mCi [id number] in-vitro labeled red blood cells IV COMPARISON:  None FINDINGS: Calculated LEFT ventricular ejection fraction is 60.8%, normal. Study was obtained at a cardiac rate of 74 bpm. Patient was rhythmic during imaging. Cine analysis of the LEFT ventricle in 3 projections demonstrates normal LEFT ventricular wall motion. IMPRESSION: Normal LEFT ventricular ejection fraction of 60.8% with normal LV wall motion. Electronically Signed   By: MarLavonia DanaD.   On: 11/26/2019 16:18   CT Abdomen Pelvis W Contrast  Result Date: 12/01/2019 CLINICAL DATA:  Newly diagnosed right breast cancer. Pretreatment staging. EXAM: CT CHEST, ABDOMEN, AND PELVIS WITH CONTRAST TECHNIQUE: Multidetector CT imaging of the chest, abdomen and pelvis was performed  following the standard protocol during bolus administration of intravenous contrast. CONTRAST:  185m OMNIPAQUE IOHEXOL 300 MG/ML  SOLN COMPARISON:  Report only from abdominal CT 05/17/2004. FINDINGS: CT CHEST FINDINGS Cardiovascular: Mild atherosclerosis of the aorta, great vessels and coronary arteries. There is a left subclavian Port-A-Cath which extends to the lower SVC level. The heart size is normal. There is no pericardial effusion. Mediastinum/Nodes: There are asymmetric right axillary lymph nodes which are not pathologically enlarged, measuring up to 8 mm short axis on image 17/2. No enlarged mediastinal, hilar or internal mammary lymph nodes. The thyroid gland, trachea and esophagus demonstrate no significant findings. Lungs/Pleura: There is no pleural effusion. Moderate to severe centrilobular emphysema with mild biapical scarring. There is additional linear  scarring or atelectasis at both lung bases. No suspicious nodularity. Musculoskeletal/Chest wall: Central right breast mass measuring up to 2.0 cm on image 27/2. No other chest wall mass or suspicious osseous findings. CT ABDOMEN AND PELVIS FINDINGS Hepatobiliary: There are several hepatic cysts, measuring up to 3.3 cm centrally in the left hepatic lobe on image 55/2. No suspicious hepatic findings. No evidence of gallstones, gallbladder wall thickening or biliary dilatation. Pancreas: Unremarkable. No pancreatic ductal dilatation or surrounding inflammatory changes. Spleen: Normal in size without focal abnormality. Adrenals/Urinary Tract: Both adrenal glands appear normal. Small bilateral renal cysts. No evidence of renal mass, urinary tract calculus or hydronephrosis. The bladder appears normal. Stomach/Bowel: No evidence of bowel wall thickening, distention or surrounding inflammatory change. Mild sigmoid colon diverticular changes. Vascular/Lymphatic: There are no enlarged abdominal or pelvic lymph nodes. Aortic and branch vessel atherosclerosis. No acute vascular findings. Reproductive: The uterus and ovaries appear normal. Other: Intact anterior abdominal wall. No ascites or peritoneal nodularity. Musculoskeletal: No acute or significant osseous findings. Multilevel spondylosis with mild osseous foraminal narrowing bilaterally at L5-S1. There are sacral Tarlov cysts. IMPRESSION: 1. Central right breast mass consistent with primary breast cancer. There are asymmetric right axillary lymph nodes which are not pathologically enlarged. Patient has known right axillary nodal disease by previous biopsy 11/10/2019. 2. No other evidence of metastatic disease in the chest, abdomen or pelvis. 3. Hepatic and renal cysts. 4. Aortic Atherosclerosis (ICD10-I70.0) and Emphysema (ICD10-J43.9). Electronically Signed   By: WRichardean SaleM.D.   On: 12/01/2019 13:24   DG Chest Port 1 View  Result Date: 11/30/2019 CLINICAL  DATA:  Follow-up Port-A-Cath placement. EXAM: PORTABLE CHEST 1 VIEW COMPARISON:  11/26/2019 FINDINGS: Port-A-Cath placed from a left subclavian approach. Tip is at the [facility] junction or just within the proximal right atrium. Patient has taken a poor inspiration. Allowing for that, the lungs are probably clear. Can not rule out an element of mild atelectasis or fluid overload, but favor findings are secondary to poor inspiration. No pneumothorax. IMPRESSION: Port-A-Cath tip at the SVC RA junction or just within the right atrium. No pneumothorax. Poor inspiration. See above. Electronically Signed   By: MNelson ChimesM.D.   On: 11/30/2019 11:09   DG C-Arm 1-60 Min-No Report  Result Date: 11/30/2019 Fluoroscopy was utilized by the requesting physician.  No radiographic interpretation.    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  PLAN:    1. 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  Case also discussed with surgery.  Given the size and stage of patient's tumor, she will benefit from neoadjuvant chemotherapy with Adriamycin, Cytoxan and Udenyca  support followed by weekly Taxol.  MUGA scan from November 26, 2019 revealed an EF of approximately 61%. CT scan of the chest, abdomen, and pelvis on December 01, 2019 reviewed independently with no obvious evidence of metastatic disease.  Patient has now had port placement.  Proceed with cycle 2 of 4 of Adriamycin and Cytoxan.  Return to clinic in 2 days for [facility] and then in 2 weeks for further evaluation and consideration of cycle 3.   2.  History of DCIS: Status post lumpectomy and XRT.  Patient reports she did not take any adjuvant hormonal therapy.  Given this history, have referred patient to genetic counseling for evaluation and testing. 3.  Anxiety/insomnia: Continue Xanax.  Patient  has been instructed to increase her dose to 0.5 mg at night as needed. 4.  Leukocytosis: Likely secondary to Udenyca.  Monitor. 5.  Headache: Patient does not complain of this today. 6.  Hypokalemia: Resolved.  Patient was previously given dietary recommendations. 7.  Dysphagia/sore throat: Possible thrush.  Patient was given a prescription for nystatin swish and swallow.  Patient expressed understanding and was in agreement with this plan. She also understands that She can call clinic at any time with any questions, concerns, or complaints.   Cancer Staging Primary cancer of upper outer quadrant of right female breast [facility]) Staging form: Breast, AJCC 8th Edition - Clinical stage from 11/26/2019: Stage IIIA (cT2, cN2a, cM0, G3, ER+, PR+, HER2-) - Signed by Lloyd Huger, MD on 11/26/2019   Lloyd Huger, MD   12/17/2019 6:40 AM

## 2019-12-15 ENCOUNTER — Encounter: Payer: Self-pay | Admitting: Oncology

## 2019-12-15 NOTE — Progress Notes (Signed)
Patient states a lot of things she eats or drinks burn. States started last Friday. Also states her mouth into her throat is sore. She also states she is still having trouble sleeping and nothing seems to help.

## 2019-12-16 ENCOUNTER — Inpatient Hospital Stay: Payer: Medicaid Other

## 2019-12-16 ENCOUNTER — Inpatient Hospital Stay (HOSPITAL_BASED_OUTPATIENT_CLINIC_OR_DEPARTMENT_OTHER): Payer: Medicaid Other | Admitting: Oncology

## 2019-12-16 ENCOUNTER — Encounter: Payer: Self-pay | Admitting: Oncology

## 2019-12-16 ENCOUNTER — Other Ambulatory Visit: Payer: Self-pay

## 2019-12-16 VITALS — BP 118/78 | HR 81 | Temp 95.1°F | Resp 18 | Wt 167.7 lb

## 2019-12-16 DIAGNOSIS — Z5111 Encounter for antineoplastic chemotherapy: Secondary | ICD-10-CM | POA: Diagnosis not present

## 2019-12-16 DIAGNOSIS — C50411 Malignant neoplasm of upper-outer quadrant of right female breast: Secondary | ICD-10-CM

## 2019-12-16 DIAGNOSIS — Z95828 Presence of other vascular implants and grafts: Secondary | ICD-10-CM

## 2019-12-16 DIAGNOSIS — B370 Candidal stomatitis: Secondary | ICD-10-CM | POA: Diagnosis not present

## 2019-12-16 LAB — COMPREHENSIVE METABOLIC PANEL
ALT: 18 U/L (ref 0–44)
AST: 18 U/L (ref 15–41)
Albumin: 4.1 g/dL (ref 3.5–5.0)
Alkaline Phosphatase: 131 U/L — ABNORMAL HIGH (ref 38–126)
Anion gap: 10 (ref 5–15)
BUN: 13 mg/dL (ref 6–20)
CO2: 23 mmol/L (ref 22–32)
Calcium: 9.5 mg/dL (ref 8.9–10.3)
Chloride: 103 mmol/L (ref 98–111)
Creatinine, Ser: 0.77 mg/dL (ref 0.44–1.00)
GFR calc Af Amer: >60 mL/min (ref >60)
GFR calc non Af Amer: >60 mL/min (ref >60)
Glucose, Bld: 91 mg/dL (ref 70–99)
Potassium: 4.2 mmol/L (ref 3.5–5.1)
Sodium: 136 mmol/L (ref 135–145)
Total Bilirubin: 0.4 mg/dL (ref 0.3–1.2)
Total Protein: 7.4 g/dL (ref 6.5–8.1)

## 2019-12-16 LAB — CBC WITH DIFFERENTIAL/PLATELET
Abs Immature Granulocytes: 1.61 10*3/uL — ABNORMAL HIGH (ref 0.00–0.07)
Basophils Absolute: 0.3 10*3/uL — ABNORMAL HIGH (ref 0.0–0.1)
Basophils Relative: 2 %
Eosinophils Absolute: 0.1 10*3/uL (ref 0.0–0.5)
Eosinophils Relative: 1 %
HCT: 39.9 % (ref 36.0–46.0)
Hemoglobin: 13.6 g/dL (ref 12.0–15.0)
Immature Granulocytes: 10 %
Lymphocytes Relative: 13 %
Lymphs Abs: 2.1 10*3/uL (ref 0.7–4.0)
MCH: 30.6 pg (ref 26.0–34.0)
MCHC: 34.1 g/dL (ref 30.0–36.0)
MCV: 89.7 fL (ref 80.0–100.0)
Monocytes Absolute: 1.2 10*3/uL — ABNORMAL HIGH (ref 0.1–1.0)
Monocytes Relative: 7 %
Neutro Abs: 10.9 10*3/uL — ABNORMAL HIGH (ref 1.7–7.7)
Neutrophils Relative %: 67 %
Platelets: 233 10*3/uL (ref 150–400)
RBC: 4.45 MIL/uL (ref 3.87–5.11)
RDW: 12.4 % (ref 11.5–15.5)
WBC: 16.3 10*3/uL — ABNORMAL HIGH (ref 4.0–10.5)
nRBC: 0 % (ref 0.0–0.2)

## 2019-12-16 MED ORDER — SODIUM CHLORIDE 0.9 % IV SOLN
Freq: Once | INTRAVENOUS | Status: AC
  Filled 2019-12-16: qty 250

## 2019-12-16 MED ORDER — SODIUM CHLORIDE 0.9% FLUSH
10.0000 mL | Freq: Once | INTRAVENOUS | Status: AC
  Administered 2019-12-16: 10 mL via INTRAVENOUS
  Filled 2019-12-16: qty 10

## 2019-12-16 MED ORDER — SODIUM CHLORIDE 0.9 % IV SOLN
10.0000 mg | Freq: Once | INTRAVENOUS | Status: AC
  Administered 2019-12-16: 10 mg via INTRAVENOUS
  Filled 2019-12-16: qty 10

## 2019-12-16 MED ORDER — DOXORUBICIN HCL CHEMO IV INJECTION 2 MG/ML
60.0000 mg/m2 | Freq: Once | INTRAVENOUS | Status: AC
  Administered 2019-12-16: 114 mg via INTRAVENOUS
  Filled 2019-12-16: qty 50

## 2019-12-16 MED ORDER — PALONOSETRON HCL INJECTION 0.25 MG/5ML
0.2500 mg | Freq: Once | INTRAVENOUS | Status: AC
  Administered 2019-12-16: 0.25 mg via INTRAVENOUS
  Filled 2019-12-16: qty 5

## 2019-12-16 MED ORDER — SODIUM CHLORIDE 0.9 % IV SOLN
150.0000 mg | Freq: Once | INTRAVENOUS | Status: AC
  Administered 2019-12-16: 150 mg via INTRAVENOUS
  Filled 2019-12-16: qty 150

## 2019-12-16 MED ORDER — HEPARIN SOD (PORK) LOCK FLUSH 100 UNIT/ML IV SOLN
500.0000 [IU] | Freq: Once | INTRAVENOUS | Status: AC | PRN
  Administered 2019-12-16: 500 [IU]
  Filled 2019-12-16: qty 5

## 2019-12-16 MED ORDER — NYSTATIN 100000 UNIT/ML MT SUSP: 5.0000 mL | Freq: Four times a day (QID) | OROMUCOSAL | 0 refills | Status: DC

## 2019-12-16 MED ORDER — SODIUM CHLORIDE 0.9 % IV SOLN
600.0000 mg/m2 | Freq: Once | INTRAVENOUS | Status: AC
  Administered 2019-12-16: 1140 mg via INTRAVENOUS
  Filled 2019-12-16: qty 50

## 2019-12-18 ENCOUNTER — Other Ambulatory Visit: Payer: Self-pay

## 2019-12-18 ENCOUNTER — Inpatient Hospital Stay: Payer: Medicaid Other

## 2019-12-18 DIAGNOSIS — C50411 Malignant neoplasm of upper-outer quadrant of right female breast: Secondary | ICD-10-CM

## 2019-12-18 DIAGNOSIS — Z5111 Encounter for antineoplastic chemotherapy: Secondary | ICD-10-CM | POA: Diagnosis not present

## 2019-12-18 MED ORDER — PEGFILGRASTIM-CBQV 6 MG/0.6ML ~~LOC~~ SOSY
6.0000 mg | PREFILLED_SYRINGE | Freq: Once | SUBCUTANEOUS | Status: AC
  Administered 2019-12-18: 6 mg via SUBCUTANEOUS
  Filled 2019-12-18: qty 0.6

## 2019-12-23 NOTE — Progress Notes (Signed)

## 2019-12-26 NOTE — Progress Notes (Signed)
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  INTERVAL HISTORY: Patient returns to clinic today for further evaluation and consideration of cycle 3 of Adriamycin and Cytoxan.  Her dysphagia has improved.  She currently feels well.  She continues to have insomnia.  She has no neurologic complaints.  She denies any recent fevers or illnesses.  She has a good appetite and denies weight loss.  She has no chest pain, shortness of breath, cough, or hemoptysis.  She denies any nausea, vomiting, constipation, or diarrhea.  She has no urinary complaints.  Patient offers no further specific complaints today.  REVIEW OF SYSTEMS:   Review of Systems  Constitutional: Negative.  Negative for fever, malaise/fatigue and weight loss.  HENT: Negative for sore throat.   Respiratory: Negative.  Negative for cough, hemoptysis and shortness of breath.   Cardiovascular: Negative.  Negative for chest pain and leg swelling.  Gastrointestinal: Negative.  Negative for abdominal pain.  Genitourinary: Negative.  Negative for dysuria.  Musculoskeletal: Positive for back pain.  Skin: Negative.  Negative for rash.  Neurological: Negative for dizziness, focal weakness, weakness and headaches.  Psychiatric/Behavioral: The patient has insomnia. The patient is not nervous/anxious.     As per HPI. Otherwise, a complete review of systems is negative.  PAST MEDICAL HISTORY: Past Medical History:  Diagnosis Date  . Brain tumor (benign) ([place])   . Breast cancer ([place]) 1999   Status post right breast lumpectomy and radiation  .  COPD (chronic obstructive pulmonary disease) ([place])   . Depression   . History of chemotherapy 1999  . History of seizure disorder   . Hypertension   . Personal history of radiation therapy 1999  . Pneumonia     PAST SURGICAL HISTORY: Past Surgical History:  Procedure Laterality Date  . BRAIN SURGERY    . Brain tumor resection  1991  . BREAST BIOPSY Right 1999   Positive  . BREAST BIOPSY Right 11/10/2019   [place] bx 10:00 heart marker, path pending  . BREAST BIOPSY Right 11/10/2019   [place] bx axilla, hydromark 4, path pending  . BREAST BIOPSY Left 11/10/2019   [place] bx 2:00, venus marker, path pending  . PORTACATH PLACEMENT Left 11/30/2019   Procedure: INSERTION PORT-A-CATH;  Surgeon: Robert Bellow, MD;  Location: [facility] ORS;  Service: General;  Laterality: Left;  . Right breast lumpectomy    . TONSILLECTOMY    . TUBAL LIGATION      FAMILY HISTORY: Family History  Problem Relation Age of Onset  . Depression Mother   . CAD Father   . Hypertension Father   . Lung cancer Father   . Breast cancer Maternal Grandmother     ADVANCED DIRECTIVES (Y/N):  N  HEALTH MAINTENANCE: Social History   Tobacco Use  . Smoking status: Current Every Day Smoker    Packs/day: 0.50    Types: Cigarettes  . Smokeless tobacco: Never Used  Substance Use Topics  . Alcohol use: No    Alcohol/week: 0.0 standard drinks  . Drug use: No    Comment: Marijuana a year ago 2020     Colonoscopy:  PAP:  Bone  density:  Lipid panel:  Allergies  Allergen Reactions  . Imitrex [Sumatriptan] Anaphylaxis  . Penicillins Rash    Did it involve swelling of the face/tongue/throat, SOB, or low BP? Unknown Did it involve sudden or severe rash/hives, skin peeling, or any reaction on the inside of your mouth or nose? Unknown Did you need to seek medical attention at a hospital or doctor's office? Unknown When did it last happen?? If all above answers are "NO", may proceed with cephalosporin use.     Current  Outpatient Medications  Medication Sig Dispense Refill  . acetaminophen (TYLENOL) 500 MG tablet Take 500-1,000 mg by mouth every 6 (six) hours as needed (for pain.).    Marland Kitchen albuterol (VENTOLIN HFA) 108 (90 Base) MCG/ACT inhaler Inhale 1-2 puffs into the lungs every 6 (six) hours as needed for wheezing or shortness of breath.    Marland Kitchen amLODipine (NORVASC) 10 MG tablet Take 10 mg by mouth daily.     Marland Kitchen lidocaine-prilocaine (EMLA) cream Apply to affected area once 30 g 3  . Melatonin 5 MG TABS Take 1 tablet by mouth at bedtime and may repeat dose one time if needed.    . ondansetron (ZOFRAN) 8 MG tablet Take 1 tablet (8 mg total) by mouth 2 (two) times daily as needed. 60 tablet 2  . prochlorperazine (COMPAZINE) 10 MG tablet Take 1 tablet (10 mg total) by mouth every 6 (six) hours as needed (Nausea or vomiting). 60 tablet 2  . Pseudoeph-Doxylamine-DM-APAP (NYQUIL PO) Take 1-2 capsules by mouth at bedtime as needed (sleep.).     Marland Kitchen tetrahydrozoline 0.05 % ophthalmic solution Place 1-2 drops into both eyes 3 (three) times daily as needed (dry/irritated eyes).    . ALPRAZolam (XANAX) 0.5 MG tablet Take 1 tablet (0.5 mg total) by mouth at bedtime as needed for anxiety. 30 tablet 0   No current facility-administered medications for this visit.   Facility-Administered Medications Ordered in Other Visits  Medication Dose Route Frequency Provider Last Rate Last Admin  . sodium chloride flush (NS) 0.9 % injection 10 mL  10 mL Intravenous PRN Lloyd Huger, MD        OBJECTIVE: Vitals:   12/30/19 0915  BP: 135/84  Pulse: 97  Resp: 19  Temp: (!) 95.2 F (35.1 C)  SpO2: 99%     Body mass index is 25.62 kg/m.    ECOG FS:0 - Asymptomatic  General: Well-developed, well-nourished, no acute distress. Eyes: Pink conjunctiva, anicteric sclera. HEENT: Normocephalic, moist mucous membranes.  Clear oropharynx. Lungs: No audible wheezing or coughing. Heart: Regular rate and rhythm. Abdomen: Soft, nontender,  no obvious distention. Musculoskeletal: No edema, cyanosis, or clubbing. Neuro: Alert, answering all questions appropriately. Cranial nerves grossly intact. Skin: No rashes or petechiae noted. Psych: Normal affect.   LAB RESULTS:  Lab Results  Component Value Date   NA 137 12/30/2019   K 3.6 12/30/2019   CL 105 12/30/2019   CO2 23 12/30/2019   GLUCOSE 117 (H) 12/30/2019   BUN 10 12/30/2019   CREATININE 0.78 12/30/2019   CALCIUM 9.1 12/30/2019   PROT 7.5 12/30/2019   ALBUMIN 4.1 12/30/2019   AST 21 12/30/2019   ALT 16 12/30/2019   ALKPHOS 123 12/30/2019   BILITOT 0.3 12/30/2019   GFRNONAA >60 12/30/2019   GFRAA >60 12/30/2019    Lab Results  Component Value Date   WBC 20.5 (H) 12/30/2019   NEUTROABS 11.9 (H) 12/30/2019   HGB 13.6 12/30/2019   HCT 39.3 12/30/2019  MCV 89.3 12/30/2019   PLT 235 12/30/2019     STUDIES: CT Chest W Contrast  Result Date: 12/01/2019 CLINICAL DATA:  Newly diagnosed right breast cancer. Pretreatment staging. EXAM: CT CHEST, ABDOMEN, AND PELVIS WITH CONTRAST TECHNIQUE: Multidetector CT imaging of the chest, abdomen and pelvis was performed following the standard protocol during bolus administration of intravenous contrast. CONTRAST:  114m OMNIPAQUE IOHEXOL 300 MG/ML  SOLN COMPARISON:  Report only from abdominal CT 05/17/2004. FINDINGS: CT CHEST FINDINGS Cardiovascular: Mild atherosclerosis of the aorta, great vessels and coronary arteries. There is a left subclavian Port-A-Cath which extends to the lower SVC level. The heart size is normal. There is no pericardial effusion. Mediastinum/Nodes: There are asymmetric right axillary lymph nodes which are not pathologically enlarged, measuring up to 8 mm short axis on image 17/2. No enlarged mediastinal, hilar or internal mammary lymph nodes. The thyroid gland, trachea and esophagus demonstrate no significant findings. Lungs/Pleura: There is no pleural effusion. Moderate to severe centrilobular emphysema  with mild biapical scarring. There is additional linear scarring or atelectasis at both lung bases. No suspicious nodularity. Musculoskeletal/Chest wall: Central right breast mass measuring up to 2.0 cm on image 27/2. No other chest wall mass or suspicious osseous findings. CT ABDOMEN AND PELVIS FINDINGS Hepatobiliary: There are several hepatic cysts, measuring up to 3.3 cm centrally in the left hepatic lobe on image 55/2. No suspicious hepatic findings. No evidence of gallstones, gallbladder wall thickening or biliary dilatation. Pancreas: Unremarkable. No pancreatic ductal dilatation or surrounding inflammatory changes. Spleen: Normal in size without focal abnormality. Adrenals/Urinary Tract: Both adrenal glands appear normal. Small bilateral renal cysts. No evidence of renal mass, urinary tract calculus or hydronephrosis. The bladder appears normal. Stomach/Bowel: No evidence of bowel wall thickening, distention or surrounding inflammatory change. Mild sigmoid colon diverticular changes. Vascular/Lymphatic: There are no enlarged abdominal or pelvic lymph nodes. Aortic and branch vessel atherosclerosis. No acute vascular findings. Reproductive: The uterus and ovaries appear normal. Other: Intact anterior abdominal wall. No ascites or peritoneal nodularity. Musculoskeletal: No acute or significant osseous findings. Multilevel spondylosis with mild osseous foraminal narrowing bilaterally at L5-S1. There are sacral Tarlov cysts. IMPRESSION: 1. Central right breast mass consistent with primary breast cancer. There are asymmetric right axillary lymph nodes which are not pathologically enlarged. Patient has known right axillary nodal disease by previous biopsy 11/10/2019. 2. No other evidence of metastatic disease in the chest, abdomen or pelvis. 3. Hepatic and renal cysts. 4. Aortic Atherosclerosis (ICD10-I70.0) and Emphysema (ICD10-J43.9). Electronically Signed   By: WRichardean SaleM.D.   On: 12/01/2019 13:24   CT  Abdomen Pelvis W Contrast  Result Date: 12/01/2019 CLINICAL DATA:  Newly diagnosed right breast cancer. Pretreatment staging. EXAM: CT CHEST, ABDOMEN, AND PELVIS WITH CONTRAST TECHNIQUE: Multidetector CT imaging of the chest, abdomen and pelvis was performed following the standard protocol during bolus administration of intravenous contrast. CONTRAST:  1029mOMNIPAQUE IOHEXOL 300 MG/ML  SOLN COMPARISON:  Report only from abdominal CT 05/17/2004. FINDINGS: CT CHEST FINDINGS Cardiovascular: Mild atherosclerosis of the aorta, great vessels and coronary arteries. There is a left subclavian Port-A-Cath which extends to the lower SVC level. The heart size is normal. There is no pericardial effusion. Mediastinum/Nodes: There are asymmetric right axillary lymph nodes which are not pathologically enlarged, measuring up to 8 mm short axis on image 17/2. No enlarged mediastinal, hilar or internal mammary lymph nodes. The thyroid gland, trachea and esophagus demonstrate no significant findings. Lungs/Pleura: There is no pleural effusion. Moderate to severe centrilobular emphysema  with mild biapical scarring. There is additional linear scarring or atelectasis at both lung bases. No suspicious nodularity. Musculoskeletal/Chest wall: Central right breast mass measuring up to 2.0 cm on image 27/2. No other chest wall mass or suspicious osseous findings. CT ABDOMEN AND PELVIS FINDINGS Hepatobiliary: There are several hepatic cysts, measuring up to 3.3 cm centrally in the left hepatic lobe on image 55/2. No suspicious hepatic findings. No evidence of gallstones, gallbladder wall thickening or biliary dilatation. Pancreas: Unremarkable. No pancreatic ductal dilatation or surrounding inflammatory changes. Spleen: Normal in size without focal abnormality. Adrenals/Urinary Tract: Both adrenal glands appear normal. Small bilateral renal cysts. No evidence of renal mass, urinary tract calculus or hydronephrosis. The bladder appears  normal. Stomach/Bowel: No evidence of bowel wall thickening, distention or surrounding inflammatory change. Mild sigmoid colon diverticular changes. Vascular/Lymphatic: There are no enlarged abdominal or pelvic lymph nodes. Aortic and branch vessel atherosclerosis. No acute vascular findings. Reproductive: The uterus and ovaries appear normal. Other: Intact anterior abdominal wall. No ascites or peritoneal nodularity. Musculoskeletal: No acute or significant osseous findings. Multilevel spondylosis with mild osseous foraminal narrowing bilaterally at L5-S1. There are sacral Tarlov cysts. IMPRESSION: 1. Central right breast mass consistent with primary breast cancer. There are asymmetric right axillary lymph nodes which are not pathologically enlarged. Patient has known right axillary nodal disease by previous biopsy 11/10/2019. 2. No other evidence of metastatic disease in the chest, abdomen or pelvis. 3. Hepatic and renal cysts. 4. Aortic Atherosclerosis (ICD10-I70.0) and Emphysema (ICD10-J43.9). Electronically Signed   By: Richardean Sale M.D.   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  PLAN:    1. 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  Case also discussed with surgery.  Given the size and stage of patient's tumor, she will benefit from neoadjuvant chemotherapy with Adriamycin, Cytoxan and Udenyca support followed by weekly Taxol.  MUGA scan from November 26, 2019 revealed an EF of approximately 61%. CT scan of the chest, abdomen, and pelvis on December 01, 2019 reviewed independently with no obvious evidence of metastatic disease.  Patient has now had port placement.  Proceed with cycle 3 of Adriamycin and Cytoxan today.  Return to clinic in 2 days for [facility] and then in 2 weeks for further evaluation and  consideration of cycle 4.  2.  History of DCIS: Status post lumpectomy and XRT.  Patient reports she did not take any adjuvant hormonal therapy.  Given this history, have referred patient to genetic counseling for evaluation and testing. 3.  Anxiety/insomnia: Continue 0.5 mg Xanax as needed. 4.  Leukocytosis: Likely secondary to Udenyca.  Monitor. 5.  Headache: Patient does not complain of this today. 6.  Hypokalemia: Resolved.  Patient was previously given dietary recommendations. 7.  Dysphagia/sore throat: Improved.  Continue nystatin as needed. 8.  Back pain: Patient was instructed to use ibuprofen sparingly.  Patient expressed understanding and was in agreement with this plan. She also understands that She can call clinic at any time with any questions, concerns, or complaints.   Cancer Staging Primary cancer of upper outer quadrant of right female breast [facility]) Staging form: Breast, AJCC 8th Edition - Clinical stage from 11/26/2019: Stage IIIA (cT2, cN2a, cM0, G3, ER+, PR+, HER2-) - Signed by Lloyd Huger, MD on 11/26/2019   Lloyd Huger, MD   12/31/2019 6:46 AM

## 2019-12-29 ENCOUNTER — Encounter: Payer: Self-pay | Admitting: Oncology

## 2019-12-29 NOTE — Progress Notes (Signed)
Patient called for prescreening assessment. She reports she is having some dizziness, headaches, and frequent mid back pain. She rate pain in back at 6. She would like to discuss with provider getting something stronger to help with the pain. She reports tylenol just is not helping. She also reports she is not sleeping well and medication prescribed did not help. Reports some trouble swallowing and feeling like something is stuck in her throat. Has had some mucus and post nasal drip. States she believes this is attributed to allergies.

## 2019-12-30 ENCOUNTER — Other Ambulatory Visit: Payer: Self-pay

## 2019-12-30 ENCOUNTER — Inpatient Hospital Stay (HOSPITAL_BASED_OUTPATIENT_CLINIC_OR_DEPARTMENT_OTHER): Payer: Medicaid Other | Admitting: Oncology

## 2019-12-30 ENCOUNTER — Inpatient Hospital Stay: Payer: Medicaid Other

## 2019-12-30 ENCOUNTER — Other Ambulatory Visit: Payer: Self-pay | Admitting: Emergency Medicine

## 2019-12-30 VITALS — BP 135/84 | HR 97 | Temp 95.2°F | Resp 19 | Wt 163.6 lb

## 2019-12-30 VITALS — BP 116/78 | HR 76 | Resp 16

## 2019-12-30 DIAGNOSIS — C50411 Malignant neoplasm of upper-outer quadrant of right female breast: Secondary | ICD-10-CM

## 2019-12-30 DIAGNOSIS — Z5111 Encounter for antineoplastic chemotherapy: Secondary | ICD-10-CM | POA: Diagnosis not present

## 2019-12-30 DIAGNOSIS — Z95828 Presence of other vascular implants and grafts: Secondary | ICD-10-CM

## 2019-12-30 LAB — COMPREHENSIVE METABOLIC PANEL
ALT: 16 U/L (ref 0–44)
AST: 21 U/L (ref 15–41)
Albumin: 4.1 g/dL (ref 3.5–5.0)
Alkaline Phosphatase: 123 U/L (ref 38–126)
Anion gap: 9 (ref 5–15)
BUN: 10 mg/dL (ref 6–20)
CO2: 23 mmol/L (ref 22–32)
Calcium: 9.1 mg/dL (ref 8.9–10.3)
Chloride: 105 mmol/L (ref 98–111)
Creatinine, Ser: 0.78 mg/dL (ref 0.44–1.00)
GFR calc Af Amer: >60 mL/min (ref >60)
GFR calc non Af Amer: >60 mL/min (ref >60)
Glucose, Bld: 117 mg/dL — ABNORMAL HIGH (ref 70–99)
Potassium: 3.6 mmol/L (ref 3.5–5.1)
Sodium: 137 mmol/L (ref 135–145)
Total Bilirubin: 0.3 mg/dL (ref 0.3–1.2)
Total Protein: 7.5 g/dL (ref 6.5–8.1)

## 2019-12-30 LAB — CBC WITH DIFFERENTIAL/PLATELET
Abs Immature Granulocytes: 4.3 10*3/uL — ABNORMAL HIGH (ref 0.00–0.07)
Band Neutrophils: 10 %
Basophils Absolute: 0.2 10*3/uL — ABNORMAL HIGH (ref 0.0–0.1)
Basophils Relative: 1 %
Eosinophils Absolute: 0 10*3/uL (ref 0.0–0.5)
Eosinophils Relative: 0 %
HCT: 39.3 % (ref 36.0–46.0)
Hemoglobin: 13.6 g/dL (ref 12.0–15.0)
Lymphocytes Relative: 9 %
Lymphs Abs: 1.8 10*3/uL (ref 0.7–4.0)
MCH: 30.9 pg (ref 26.0–34.0)
MCHC: 34.6 g/dL (ref 30.0–36.0)
MCV: 89.3 fL (ref 80.0–100.0)
Metamyelocytes Relative: 13 %
Monocytes Absolute: 2.3 10*3/uL — ABNORMAL HIGH (ref 0.1–1.0)
Monocytes Relative: 11 %
Myelocytes: 8 %
Neutro Abs: 11.9 10*3/uL — ABNORMAL HIGH (ref 1.7–7.7)
Neutrophils Relative %: 48 %
Platelets: 235 10*3/uL (ref 150–400)
RBC: 4.4 MIL/uL (ref 3.87–5.11)
RDW: 13 % (ref 11.5–15.5)
WBC: 20.5 10*3/uL — ABNORMAL HIGH (ref 4.0–10.5)
nRBC: 0.1 % (ref 0.0–0.2)

## 2019-12-30 MED ORDER — ALPRAZOLAM 0.5 MG PO TABS: 0.5000 mg | ORAL_TABLET | Freq: Every evening | ORAL | 0 refills | Status: DC | PRN

## 2019-12-30 MED ORDER — PALONOSETRON HCL INJECTION 0.25 MG/5ML
0.2500 mg | Freq: Once | INTRAVENOUS | Status: AC
  Administered 2019-12-30: 10:00:00 0.25 mg via INTRAVENOUS
  Filled 2019-12-30: qty 5

## 2019-12-30 MED ORDER — SODIUM CHLORIDE 0.9 % IV SOLN
Freq: Once | INTRAVENOUS | Status: AC
  Filled 2019-12-30: qty 250

## 2019-12-30 MED ORDER — SODIUM CHLORIDE 0.9% FLUSH
10.0000 mL | INTRAVENOUS | Status: DC | PRN
  Filled 2019-12-30: qty 10

## 2019-12-30 MED ORDER — HEPARIN SOD (PORK) LOCK FLUSH 100 UNIT/ML IV SOLN
INTRAVENOUS | Status: AC
  Filled 2019-12-30: qty 5

## 2019-12-30 MED ORDER — SODIUM CHLORIDE 0.9 % IV SOLN
600.0000 mg/m2 | Freq: Once | INTRAVENOUS | Status: AC
  Administered 2019-12-30: 1140 mg via INTRAVENOUS
  Filled 2019-12-30: qty 50

## 2019-12-30 MED ORDER — DOXORUBICIN HCL CHEMO IV INJECTION 2 MG/ML
60.0000 mg/m2 | Freq: Once | INTRAVENOUS | Status: AC
  Administered 2019-12-30: 114 mg via INTRAVENOUS
  Filled 2019-12-30: qty 57

## 2019-12-30 MED ORDER — SODIUM CHLORIDE 0.9 % IV SOLN
10.0000 mg | Freq: Once | INTRAVENOUS | Status: AC
  Administered 2019-12-30: 10 mg via INTRAVENOUS
  Filled 2019-12-30: qty 10

## 2019-12-30 MED ORDER — HEPARIN SOD (PORK) LOCK FLUSH 100 UNIT/ML IV SOLN
500.0000 [IU] | Freq: Once | INTRAVENOUS | Status: AC
  Administered 2019-12-30: 500 [IU] via INTRAVENOUS
  Filled 2019-12-30: qty 5

## 2019-12-30 MED ORDER — SODIUM CHLORIDE 0.9 % IV SOLN
150.0000 mg | Freq: Once | INTRAVENOUS | Status: AC
  Administered 2019-12-30: 150 mg via INTRAVENOUS
  Filled 2019-12-30: qty 150

## 2019-12-30 NOTE — Research (Signed)
SWOG [id number] Protocol Consent:  Research RN and Yolande Jolly, RN met with the patient in her exam room prior to her visit with Dr. Grayland Ormond today to review the [phone] Neuropathy Protocol. The patient was open to hearing about the protocol procedures. The protocol consent was reviewed briefly, protocol treatments, alternatives, side effects, potential benefits were explained to the patient. The patient was informed that her participation is strictly voluntary, she will not be paid for participation and she can withdraw from the study at any time. Patient was informed that consent for the protocol can't be obtained until she completes her "[facility]" chemotherapy prior to her Taxane administration in a couple weeks. Research RN's will meet with the patient at her last cycle visit for [facility] to obtain her consent after completion of therapy if she is interested in participating. A copy of the protocol consent and [facility] was provided with Research RN business cards for her to take home and review prior to her last cycle visit. The patient was encouraged to call the research office if she has any questions or concerns prior to that next visit. The patients questions were answered to her reported satisfaction, she is in agreement to review the protocol at home. Dr. Grayland Ormond was notified that the patient is interested in participating currently.   12/30/2019 Jeral Fruit, RN, BSN, [place] am

## 2019-12-31 ENCOUNTER — Other Ambulatory Visit: Payer: Self-pay | Admitting: *Deleted

## 2019-12-31 DIAGNOSIS — C50411 Malignant neoplasm of upper-outer quadrant of right female breast: Secondary | ICD-10-CM

## 2019-12-31 MED ORDER — ALPRAZOLAM 0.5 MG PO TABS: 0.5000 mg | ORAL_TABLET | Freq: Every evening | ORAL | 0 refills | Status: DC | PRN

## 2019-12-31 NOTE — Telephone Encounter (Signed)
Prescription from yesterday printed and did not go to pharmacy

## 2020-01-01 ENCOUNTER — Inpatient Hospital Stay: Payer: Medicaid Other

## 2020-01-01 ENCOUNTER — Other Ambulatory Visit: Payer: Self-pay

## 2020-01-01 DIAGNOSIS — Z5111 Encounter for antineoplastic chemotherapy: Secondary | ICD-10-CM | POA: Diagnosis not present

## 2020-01-01 DIAGNOSIS — C50411 Malignant neoplasm of upper-outer quadrant of right female breast: Secondary | ICD-10-CM

## 2020-01-01 MED ORDER — PEGFILGRASTIM-CBQV 6 MG/0.6ML ~~LOC~~ SOSY
6.0000 mg | PREFILLED_SYRINGE | Freq: Once | SUBCUTANEOUS | Status: AC
  Administered 2020-01-01: 6 mg via SUBCUTANEOUS
  Filled 2020-01-01: qty 0.6

## 2020-01-06 NOTE — Progress Notes (Signed)

## 2020-01-08 NOTE — Progress Notes (Signed)
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  INTERVAL HISTORY: Patient returns to clinic today for further evaluation and consideration of cycle 4 of Adriamycin and Cytoxan.  She continues to have mouth pain and occasional dysphagia.  She also complains of difficulty sleeping.  Finally, she has restless leg and a persistent ache in her pelvis and legs.  She has no neurologic complaints.  She denies any recent fevers or illnesses.  She has a fair appetite and denies weight loss.  She has no chest pain, shortness of breath, cough, or hemoptysis.  She denies any nausea, vomiting, constipation, or diarrhea.  She has no urinary complaints.  Patient offers no further specific complaints today.  REVIEW OF SYSTEMS:   Review of Systems  Constitutional: Negative.  Negative for fever, malaise/fatigue and weight loss.  HENT: Positive for sore throat.   Respiratory: Negative.  Negative for cough, hemoptysis and shortness of breath.   Cardiovascular: Negative.  Negative for chest pain and leg swelling.  Gastrointestinal: Negative.  Negative for abdominal pain.  Genitourinary: Negative.  Negative for dysuria.  Musculoskeletal: Positive for myalgias. Negative for back pain.  Skin: Negative.  Negative for rash.  Neurological: Negative for dizziness, focal weakness, weakness and headaches.  Psychiatric/Behavioral: The patient has insomnia. The patient is not nervous/anxious.     As per HPI. Otherwise, a complete review of systems is negative.  PAST  MEDICAL HISTORY: Past Medical History:  Diagnosis Date  . Brain tumor (benign) ([place])   . Breast cancer ([place]) 1999   Status post right breast lumpectomy and radiation  . COPD (chronic obstructive pulmonary disease) ([place])   . Depression   . History of chemotherapy 1999  . History of seizure disorder   . Hypertension   . Personal history of radiation therapy 1999  . Pneumonia     PAST SURGICAL HISTORY: Past Surgical History:  Procedure Laterality Date  . BRAIN SURGERY    . Brain tumor resection  1991  . BREAST BIOPSY Right 1999   Positive  . BREAST BIOPSY Right 11/10/2019   [place] bx 10:00 heart marker, path pending  . BREAST BIOPSY Right 11/10/2019   [place] bx axilla, hydromark 4, path pending  . BREAST BIOPSY Left 11/10/2019   [place] bx 2:00, venus marker, path pending  . PORTACATH PLACEMENT Left 11/30/2019   Procedure: INSERTION PORT-A-CATH;  Surgeon: Robert Bellow, MD;  Location: [facility] ORS;  Service: General;  Laterality: Left;  . Right breast lumpectomy    . TONSILLECTOMY    . TUBAL LIGATION      FAMILY HISTORY: Family History  Problem Relation Age of Onset  . Depression Mother   . CAD Father   . Hypertension Father   . Lung cancer Father   . Breast cancer Maternal Grandmother     ADVANCED DIRECTIVES (Y/N):  N  HEALTH MAINTENANCE: Social History   Tobacco Use  . Smoking status: Current Every Day Smoker    Packs/day: 0.50    Types: Cigarettes  . Smokeless tobacco: Never Used  Substance Use Topics  . Alcohol use: No  Alcohol/week: 0.0 standard drinks  . Drug use: No    Comment: Marijuana a year ago 2020     Colonoscopy:  PAP:  Bone density:  Lipid panel:  Allergies  Allergen Reactions  . Imitrex [Sumatriptan] Anaphylaxis  . Penicillins Rash    Did it involve swelling of the face/tongue/throat, SOB, or low BP? Unknown Did it involve sudden or severe rash/hives, skin peeling, or any reaction on the inside of your mouth or nose? Unknown Did you need to  seek medical attention at a hospital or doctor's office? Unknown When did it last happen?? If all above answers are "NO", may proceed with cephalosporin use.     Current Outpatient Medications  Medication Sig Dispense Refill  . acetaminophen (TYLENOL) 500 MG tablet Take 500-1,000 mg by mouth every 6 (six) hours as needed (for pain.).    Marland Kitchen albuterol (VENTOLIN HFA) 108 (90 Base) MCG/ACT inhaler Inhale 1-2 puffs into the lungs every 6 (six) hours as needed for wheezing or shortness of breath.    . ALPRAZolam (XANAX) 0.5 MG tablet Take 1 tablet (0.5 mg total) by mouth at bedtime as needed for anxiety. 30 tablet 0  . amLODipine (NORVASC) 10 MG tablet Take 10 mg by mouth daily.     Marland Kitchen lidocaine-prilocaine (EMLA) cream Apply to affected area once 30 g 3  . Melatonin 5 MG TABS Take 1 tablet by mouth at bedtime and may repeat dose one time if needed.    . ondansetron (ZOFRAN) 8 MG tablet Take 1 tablet (8 mg total) by mouth 2 (two) times daily as needed. 60 tablet 2  . prochlorperazine (COMPAZINE) 10 MG tablet Take 1 tablet (10 mg total) by mouth every 6 (six) hours as needed (Nausea or vomiting). 60 tablet 2  . Pseudoeph-Doxylamine-DM-APAP (NYQUIL PO) Take 1-2 capsules by mouth at bedtime as needed (sleep.).     Marland Kitchen tetrahydrozoline 0.05 % ophthalmic solution Place 1-2 drops into both eyes 3 (three) times daily as needed (dry/irritated eyes).     No current facility-administered medications for this visit.   Facility-Administered Medications Ordered in Other Visits  Medication Dose Route Frequency Provider Last Rate Last Admin  . sodium chloride flush (NS) 0.9 % injection 10 mL  10 mL Intravenous PRN Lloyd Huger, MD      . sodium chloride flush (NS) 0.9 % injection 10 mL  10 mL Intravenous PRN Lloyd Huger, MD   10 mL at 01/13/20 0851    OBJECTIVE: Vitals:   01/13/20 0905  BP: 116/81  Pulse: 90  Resp: 20  Temp: (!) 96.7 F (35.9 C)  SpO2: 99%     Body mass index is 25.62  kg/m.    ECOG FS:0 - Asymptomatic  General: Thin, no acute distress. Eyes: Pink conjunctiva, anicteric sclera. HEENT: Normocephalic, moist mucous membranes. Lungs: No audible wheezing or coughing. Heart: Regular rate and rhythm. Abdomen: Soft, nontender, no obvious distention. Musculoskeletal: No edema, cyanosis, or clubbing. Neuro: Alert, answering all questions appropriately. Cranial nerves grossly intact. Skin: No rashes or petechiae noted. Psych: Normal affect.   LAB RESULTS:  Lab Results  Component Value Date   NA 139 01/13/2020   K 3.3 (L) 01/13/2020   CL 106 01/13/2020   CO2 23 01/13/2020   GLUCOSE 111 (H) 01/13/2020   BUN 9 01/13/2020   CREATININE 0.71 01/13/2020   CALCIUM 9.1 01/13/2020   PROT 7.3 01/13/2020   ALBUMIN 3.9 01/13/2020   AST 19 01/13/2020   ALT 12 01/13/2020  ALKPHOS 126 01/13/2020   BILITOT 0.4 01/13/2020   GFRNONAA >60 01/13/2020   GFRAA >60 01/13/2020    Lab Results  Component Value Date   WBC 29.1 (H) 01/13/2020   NEUTROABS PENDING 01/13/2020   HGB 12.6 01/13/2020   HCT 36.2 01/13/2020   MCV 89.4 01/13/2020   PLT 213 01/13/2020     STUDIES: No results found.  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  PLAN:    1. 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  Case also discussed with surgery.  Given the size and stage of patient's tumor, she will benefit from neoadjuvant chemotherapy with Adriamycin, Cytoxan and Udenyca support followed by weekly Taxol.  MUGA scan from November 26, 2019 revealed an EF of approximately 61%. CT scan of the chest, abdomen, and pelvis on December 01, 2019 reviewed independently with no obvious evidence of metastatic disease.  Patient has now had port placement.  Proceed with cycle 4 of Adriamycin and Cytoxan.  Given patient's elevated  white blood cell count, will hold Udenyca.  Return to clinic in 2 weeks for further evaluation and consideration of cycle 1 of 12 of weekly Taxol.   2.  History of DCIS: Status post lumpectomy and XRT.  Patient reports she did not take any adjuvant hormonal therapy.  Given this history, have referred patient to genetic counseling for evaluation and testing. 3.  Anxiety/insomnia: Continue 0.5 mg Xanax as needed.  Patient was instructed that she can increase to 1 mg nightly if necessary. 4.  Leukocytosis: Likely secondary to Udenyca.  Hold Udenyca as above. 5.  Headache: Patient does not complain of this today. 6.  Hypokalemia: Mild.  Patient was previously given dietary recommendations. 7.  Dysphagia/sore throat: Patient was given a prescription for Magic mouthwash today. 8.  Back pain: Patient was instructed to use ibuprofen sparingly. 9.  Myalgias/restless leg: Possibly secondary to Udenyca.  Hold as above.  Patient expressed understanding and was in agreement with this plan. She also understands that She can call clinic at any time with any questions, concerns, or complaints.   Cancer Staging Primary cancer of upper outer quadrant of right female breast [facility]) Staging form: Breast, AJCC 8th Edition - Clinical stage from 11/26/2019: Stage IIIA (cT2, cN2a, cM0, G3, ER+, PR+, HER2-) - Signed by Lloyd Huger, MD on 11/26/2019   Lloyd Huger, MD   01/13/2020 9:43 AM

## 2020-01-12 ENCOUNTER — Encounter: Payer: Self-pay | Admitting: Oncology

## 2020-01-12 NOTE — Progress Notes (Signed)
Patient states her mouth burns and is very tender. Really achy in legs and thighs. Nausea that comes and go She states she is having some brain fog and dizziness that comes and goes.   Patient also expresses at appointment she doesn't feel current dose of xanax is working. She states she is still having frequent anxiety and unable to sleep.

## 2020-01-13 ENCOUNTER — Inpatient Hospital Stay (HOSPITAL_BASED_OUTPATIENT_CLINIC_OR_DEPARTMENT_OTHER): Payer: Medicaid Other | Admitting: Oncology

## 2020-01-13 ENCOUNTER — Inpatient Hospital Stay: Payer: Medicaid Other | Attending: Oncology

## 2020-01-13 ENCOUNTER — Telehealth: Payer: Self-pay

## 2020-01-13 ENCOUNTER — Encounter: Payer: Self-pay | Admitting: Oncology

## 2020-01-13 ENCOUNTER — Other Ambulatory Visit: Payer: Self-pay

## 2020-01-13 ENCOUNTER — Inpatient Hospital Stay: Payer: Medicaid Other

## 2020-01-13 VITALS — BP 116/81 | HR 90 | Temp 96.7°F | Resp 20 | Wt 163.6 lb

## 2020-01-13 DIAGNOSIS — Z170 Estrogen receptor positive status [ER+]: Secondary | ICD-10-CM | POA: Insufficient documentation

## 2020-01-13 DIAGNOSIS — F329 Major depressive disorder, single episode, unspecified: Secondary | ICD-10-CM | POA: Insufficient documentation

## 2020-01-13 DIAGNOSIS — D701 Agranulocytosis secondary to cancer chemotherapy: Secondary | ICD-10-CM | POA: Diagnosis not present

## 2020-01-13 DIAGNOSIS — K1379 Other lesions of oral mucosa: Secondary | ICD-10-CM | POA: Diagnosis not present

## 2020-01-13 DIAGNOSIS — C50411 Malignant neoplasm of upper-outer quadrant of right female breast: Secondary | ICD-10-CM

## 2020-01-13 DIAGNOSIS — Z791 Long term (current) use of non-steroidal anti-inflammatories (NSAID): Secondary | ICD-10-CM | POA: Diagnosis not present

## 2020-01-13 DIAGNOSIS — R42 Dizziness and giddiness: Secondary | ICD-10-CM | POA: Diagnosis not present

## 2020-01-13 DIAGNOSIS — Z5111 Encounter for antineoplastic chemotherapy: Secondary | ICD-10-CM | POA: Insufficient documentation

## 2020-01-13 DIAGNOSIS — Z923 Personal history of irradiation: Secondary | ICD-10-CM | POA: Diagnosis not present

## 2020-01-13 DIAGNOSIS — G4700 Insomnia, unspecified: Secondary | ICD-10-CM | POA: Diagnosis not present

## 2020-01-13 DIAGNOSIS — Z006 Encounter for examination for normal comparison and control in clinical research program: Secondary | ICD-10-CM | POA: Diagnosis present

## 2020-01-13 DIAGNOSIS — M549 Dorsalgia, unspecified: Secondary | ICD-10-CM | POA: Diagnosis not present

## 2020-01-13 DIAGNOSIS — G2581 Restless legs syndrome: Secondary | ICD-10-CM | POA: Diagnosis not present

## 2020-01-13 DIAGNOSIS — J029 Acute pharyngitis, unspecified: Secondary | ICD-10-CM | POA: Diagnosis not present

## 2020-01-13 DIAGNOSIS — D72829 Elevated white blood cell count, unspecified: Secondary | ICD-10-CM | POA: Insufficient documentation

## 2020-01-13 DIAGNOSIS — Z86000 Personal history of in-situ neoplasm of breast: Secondary | ICD-10-CM | POA: Insufficient documentation

## 2020-01-13 DIAGNOSIS — R0682 Tachypnea, not elsewhere classified: Secondary | ICD-10-CM | POA: Diagnosis not present

## 2020-01-13 DIAGNOSIS — R000 Tachycardia, unspecified: Secondary | ICD-10-CM | POA: Insufficient documentation

## 2020-01-13 DIAGNOSIS — E876 Hypokalemia: Secondary | ICD-10-CM | POA: Diagnosis not present

## 2020-01-13 DIAGNOSIS — R110 Nausea: Secondary | ICD-10-CM | POA: Insufficient documentation

## 2020-01-13 DIAGNOSIS — R1310 Dysphagia, unspecified: Secondary | ICD-10-CM | POA: Diagnosis not present

## 2020-01-13 DIAGNOSIS — F17210 Nicotine dependence, cigarettes, uncomplicated: Secondary | ICD-10-CM | POA: Insufficient documentation

## 2020-01-13 DIAGNOSIS — Z95828 Presence of other vascular implants and grafts: Secondary | ICD-10-CM

## 2020-01-13 DIAGNOSIS — Z79899 Other long term (current) drug therapy: Secondary | ICD-10-CM | POA: Diagnosis not present

## 2020-01-13 DIAGNOSIS — F419 Anxiety disorder, unspecified: Secondary | ICD-10-CM | POA: Diagnosis not present

## 2020-01-13 LAB — COMPREHENSIVE METABOLIC PANEL
ALT: 12 U/L (ref 0–44)
AST: 19 U/L (ref 15–41)
Albumin: 3.9 g/dL (ref 3.5–5.0)
Alkaline Phosphatase: 126 U/L (ref 38–126)
Anion gap: 10 (ref 5–15)
BUN: 9 mg/dL (ref 6–20)
CO2: 23 mmol/L (ref 22–32)
Calcium: 9.1 mg/dL (ref 8.9–10.3)
Chloride: 106 mmol/L (ref 98–111)
Creatinine, Ser: 0.71 mg/dL (ref 0.44–1.00)
GFR calc Af Amer: >60 mL/min (ref >60)
GFR calc non Af Amer: >60 mL/min (ref >60)
Glucose, Bld: 111 mg/dL — ABNORMAL HIGH (ref 70–99)
Potassium: 3.3 mmol/L — ABNORMAL LOW (ref 3.5–5.1)
Sodium: 139 mmol/L (ref 135–145)
Total Bilirubin: 0.4 mg/dL (ref 0.3–1.2)
Total Protein: 7.3 g/dL (ref 6.5–8.1)

## 2020-01-13 LAB — CBC WITH DIFFERENTIAL/PLATELET
Abs Immature Granulocytes: 2.62 10*3/uL — ABNORMAL HIGH (ref 0.00–0.07)
Band Neutrophils: 8 %
Basophils Absolute: 0.3 10*3/uL — ABNORMAL HIGH (ref 0.0–0.1)
Basophils Relative: 1 %
Blasts: 0 %
Eosinophils Absolute: 0 10*3/uL (ref 0.0–0.5)
Eosinophils Relative: 0 %
HCT: 36.2 % (ref 36.0–46.0)
Hemoglobin: 12.6 g/dL (ref 12.0–15.0)
Lymphocytes Relative: 9 %
Lymphs Abs: 2.6 10*3/uL (ref 0.7–4.0)
MCH: 31.1 pg (ref 26.0–34.0)
MCHC: 34.8 g/dL (ref 30.0–36.0)
MCV: 89.4 fL (ref 80.0–100.0)
Metamyelocytes Relative: 3 %
Monocytes Absolute: 0.9 10*3/uL (ref 0.1–1.0)
Monocytes Relative: 3 %
Myelocytes: 6 %
Neutro Abs: 22.7 10*3/uL — ABNORMAL HIGH (ref 1.7–7.7)
Neutrophils Relative %: 70 %
Other: 0 %
Platelets: 213 10*3/uL (ref 150–400)
Promyelocytes Relative: 0 %
RBC: 4.05 MIL/uL (ref 3.87–5.11)
RDW: 13.8 % (ref 11.5–15.5)
Smear Review: ADEQUATE
WBC: 29.1 10*3/uL — ABNORMAL HIGH (ref 4.0–10.5)
nRBC: 0 /100 WBC
nRBC: 0.2 % (ref 0.0–0.2)

## 2020-01-13 MED ORDER — SODIUM CHLORIDE 0.9% FLUSH
10.0000 mL | INTRAVENOUS | Status: DC | PRN
  Administered 2020-01-13: 10 mL via INTRAVENOUS
  Filled 2020-01-13: qty 10

## 2020-01-13 MED ORDER — SODIUM CHLORIDE 0.9 % IV SOLN
600.0000 mg/m2 | Freq: Once | INTRAVENOUS | Status: AC
  Administered 2020-01-13: 11:00:00 1140 mg via INTRAVENOUS
  Filled 2020-01-13: qty 50

## 2020-01-13 MED ORDER — PALONOSETRON HCL INJECTION 0.25 MG/5ML
0.2500 mg | Freq: Once | INTRAVENOUS | Status: AC
  Administered 2020-01-13: 0.25 mg via INTRAVENOUS
  Filled 2020-01-13: qty 5

## 2020-01-13 MED ORDER — DOXORUBICIN HCL CHEMO IV INJECTION 2 MG/ML
60.0000 mg/m2 | Freq: Once | INTRAVENOUS | Status: AC
  Administered 2020-01-13: 114 mg via INTRAVENOUS
  Filled 2020-01-13: qty 50

## 2020-01-13 MED ORDER — SODIUM CHLORIDE 0.9 % IV SOLN
Freq: Once | INTRAVENOUS | Status: AC
  Filled 2020-01-13: qty 250

## 2020-01-13 MED ORDER — HEPARIN SOD (PORK) LOCK FLUSH 100 UNIT/ML IV SOLN
500.0000 [IU] | Freq: Once | INTRAVENOUS | Status: AC | PRN
  Administered 2020-01-13: 500 [IU]
  Filled 2020-01-13: qty 5

## 2020-01-13 MED ORDER — SODIUM CHLORIDE 0.9 % IV SOLN
10.0000 mg | Freq: Once | INTRAVENOUS | Status: AC
  Administered 2020-01-13: 10 mg via INTRAVENOUS
  Filled 2020-01-13: qty 10

## 2020-01-13 MED ORDER — SODIUM CHLORIDE 0.9 % IV SOLN
150.0000 mg | Freq: Once | INTRAVENOUS | Status: AC
  Administered 2020-01-13: 150 mg via INTRAVENOUS
  Filled 2020-01-13: qty 150

## 2020-01-13 MED ORDER — HEPARIN SOD (PORK) LOCK FLUSH 100 UNIT/ML IV SOLN
INTRAVENOUS | Status: AC
  Filled 2020-01-13: qty 5

## 2020-01-14 NOTE — Telephone Encounter (Signed)
Spoke with patient in clinic and she is willing to participate in the S1714 protocol. Patient is scheduled to return for consent visit Tuesday Jan 19, 2020 at 1100 am with the research RN. Jeral Fruit, RN, BSN, OCN

## 2020-01-15 ENCOUNTER — Inpatient Hospital Stay: Payer: Medicaid Other

## 2020-01-20 ENCOUNTER — Other Ambulatory Visit: Payer: Self-pay | Admitting: Emergency Medicine

## 2020-01-20 MED ORDER — ALPRAZOLAM 0.5 MG PO TABS
1.0000 mg | ORAL_TABLET | Freq: Every evening | ORAL | 0 refills | Status: DC | PRN
Start: 2020-01-20 — End: 2020-02-17

## 2020-01-20 NOTE — Progress Notes (Signed)

## 2020-01-21 NOTE — Progress Notes (Signed)
Patient requests financial  Assistance, and refill on medication.  Receipts forwarded to [facility], and notified Dr. Grayland Ormond of medication needs.

## 2020-01-22 ENCOUNTER — Other Ambulatory Visit: Payer: Self-pay

## 2020-01-22 ENCOUNTER — Inpatient Hospital Stay (HOSPITAL_BASED_OUTPATIENT_CLINIC_OR_DEPARTMENT_OTHER): Payer: Medicaid Other | Admitting: Oncology

## 2020-01-22 ENCOUNTER — Inpatient Hospital Stay: Payer: Medicaid Other

## 2020-01-22 ENCOUNTER — Telehealth: Payer: Self-pay | Admitting: *Deleted

## 2020-01-22 ENCOUNTER — Encounter: Payer: Self-pay | Admitting: Oncology

## 2020-01-22 VITALS — BP 106/72 | HR 116 | Temp 98.3°F | Resp 22 | Wt 160.6 lb

## 2020-01-22 DIAGNOSIS — Z95828 Presence of other vascular implants and grafts: Secondary | ICD-10-CM

## 2020-01-22 DIAGNOSIS — K047 Periapical abscess without sinus: Secondary | ICD-10-CM | POA: Diagnosis not present

## 2020-01-22 DIAGNOSIS — C50411 Malignant neoplasm of upper-outer quadrant of right female breast: Secondary | ICD-10-CM

## 2020-01-22 DIAGNOSIS — K1379 Other lesions of oral mucosa: Secondary | ICD-10-CM

## 2020-01-22 DIAGNOSIS — Z5111 Encounter for antineoplastic chemotherapy: Secondary | ICD-10-CM | POA: Diagnosis not present

## 2020-01-22 LAB — CBC WITH DIFFERENTIAL/PLATELET
Abs Immature Granulocytes: 0 10*3/uL (ref 0.00–0.07)
Band Neutrophils: 2 %
Basophils Absolute: 0 10*3/uL (ref 0.0–0.1)
Basophils Relative: 1 %
Eosinophils Absolute: 0 10*3/uL (ref 0.0–0.5)
Eosinophils Relative: 0 %
HCT: 31.6 % — ABNORMAL LOW (ref 36.0–46.0)
Hemoglobin: 11 g/dL — ABNORMAL LOW (ref 12.0–15.0)
Lymphocytes Relative: 8 %
Lymphs Abs: 0.2 10*3/uL — ABNORMAL LOW (ref 0.7–4.0)
MCH: 31.1 pg (ref 26.0–34.0)
MCHC: 34.8 g/dL (ref 30.0–36.0)
MCV: 89.3 fL (ref 80.0–100.0)
Monocytes Absolute: 0.1 10*3/uL (ref 0.1–1.0)
Monocytes Relative: 3 %
Neutro Abs: 2.2 10*3/uL (ref 1.7–7.7)
Neutrophils Relative %: 86 %
Platelets: 148 10*3/uL — ABNORMAL LOW (ref 150–400)
RBC: 3.54 MIL/uL — ABNORMAL LOW (ref 3.87–5.11)
RDW: 13.7 % (ref 11.5–15.5)
Smear Review: ADEQUATE
WBC: 2.5 10*3/uL — ABNORMAL LOW (ref 4.0–10.5)
nRBC: 0 % (ref 0.0–0.2)

## 2020-01-22 LAB — COMPREHENSIVE METABOLIC PANEL
ALT: 17 U/L (ref 0–44)
AST: 14 U/L — ABNORMAL LOW (ref 15–41)
Albumin: 4.1 g/dL (ref 3.5–5.0)
Alkaline Phosphatase: 94 U/L (ref 38–126)
Anion gap: 9 (ref 5–15)
BUN: 9 mg/dL (ref 6–20)
CO2: 21 mmol/L — ABNORMAL LOW (ref 22–32)
Calcium: 8.7 mg/dL — ABNORMAL LOW (ref 8.9–10.3)
Chloride: 105 mmol/L (ref 98–111)
Creatinine, Ser: 0.58 mg/dL (ref 0.44–1.00)
GFR calc Af Amer: >60 mL/min (ref >60)
GFR calc non Af Amer: >60 mL/min (ref >60)
Glucose, Bld: 111 mg/dL — ABNORMAL HIGH (ref 70–99)
Potassium: 2.8 mmol/L — ABNORMAL LOW (ref 3.5–5.1)
Sodium: 135 mmol/L (ref 135–145)
Total Bilirubin: 0.5 mg/dL (ref 0.3–1.2)
Total Protein: 7.1 g/dL (ref 6.5–8.1)

## 2020-01-22 MED ORDER — SODIUM CHLORIDE 0.9% FLUSH
10.0000 mL | Freq: Once | INTRAVENOUS | Status: AC
  Administered 2020-01-22: 10 mL via INTRAVENOUS
  Filled 2020-01-22: qty 10

## 2020-01-22 MED ORDER — PREDNISONE 10 MG (21) PO TBPK
ORAL_TABLET | ORAL | 0 refills | Status: DC
Start: 2020-01-22 — End: 2020-02-22

## 2020-01-22 MED ORDER — SODIUM CHLORIDE 0.9 % IV SOLN
INTRAVENOUS | Status: DC
  Filled 2020-01-22 (×2): qty 250

## 2020-01-22 MED ORDER — ONDANSETRON HCL 4 MG/2ML IJ SOLN
8.0000 mg | Freq: Once | INTRAMUSCULAR | Status: AC
  Administered 2020-01-22: 8 mg via INTRAVENOUS
  Filled 2020-01-22: qty 4

## 2020-01-22 MED ORDER — HYDROCODONE-ACETAMINOPHEN 5-325 MG PO TABS: 1.0000 | ORAL_TABLET | Freq: Four times a day (QID) | ORAL | 0 refills | Status: DC | PRN

## 2020-01-22 MED ORDER — POTASSIUM CHLORIDE CRYS ER 20 MEQ PO TBCR: 20.0000 meq | EXTENDED_RELEASE_TABLET | Freq: Two times a day (BID) | ORAL | 0 refills | Status: DC

## 2020-01-22 MED ORDER — MORPHINE SULFATE 2 MG/ML IJ SOLN
2.0000 mg | Freq: Once | INTRAMUSCULAR | Status: AC
  Administered 2020-01-22: 2 mg via INTRAVENOUS
  Filled 2020-01-22: qty 1

## 2020-01-22 MED ORDER — AMOXICILLIN-POT CLAVULANATE 875-125 MG PO TABS: 1.0000 | ORAL_TABLET | Freq: Two times a day (BID) | ORAL | 0 refills | Status: DC

## 2020-01-22 MED ORDER — HEPARIN SOD (PORK) LOCK FLUSH 100 UNIT/ML IV SOLN
500.0000 [IU] | Freq: Once | INTRAVENOUS | Status: AC
  Administered 2020-01-22: 500 [IU]
  Filled 2020-01-22: qty 5

## 2020-01-22 NOTE — Patient Instructions (Signed)
I am so sorry that you are not feeling well today.  While you were here you were given some nausea medicine and some IV fluids.  I also gave you some pain medicine.  Your lab work shows a lower white blood cell count but you are not considered neutropenic.  This is good.  I believe we can keep you outpatient for treatment.  I have called you in a prescription for Augmentin and some steroids.  I also have called you and something for pain.  Please start all these medications today.

## 2020-01-22 NOTE — Progress Notes (Signed)
Symptom Management Consult note [facility]  [phone] [phone]  Patient Care Team: Patient, No Pcp Per as PCP - General (General Practice) Rico Junker, RN as Registered Nurse Theodore Demark, RN as Oncology Nurse Navigator (Oncology) Lloyd Huger, MD as Consulting Physician (Oncology)   Name of the patient: Kelsey Russo  [id number]  01/05/1966   Date of visit: 01/22/2020   Diagnosis- Stage IIIa ER/PR positive  Chief complaint/ Reason for visit- infected tooth/mouth pain  Heme/Onc history:  Oncology History  Primary cancer of upper outer quadrant of right female breast ([place])  11/19/2019 Initial Diagnosis   Primary cancer of upper outer quadrant of right female breast ([place])   11/26/2019 Cancer Staging   Staging form: Breast, AJCC 8th Edition - Clinical stage from 11/26/2019: Stage IIIA (cT2, cN2a, cM0, G3, ER+, PR+, HER2-) - Signed by Lloyd Huger, MD on 11/26/2019   12/02/2019 -  Chemotherapy   The patient had DOXOrubicin (ADRIAMYCIN) chemo injection 114 mg, 60 mg/m2 = 114 mg, Intravenous,  Once, 4 of 4 cycles Administration: 114 mg (12/02/2019), 114 mg (12/16/2019), 114 mg (12/30/2019), 114 mg (01/13/2020) palonosetron (ALOXI) injection 0.25 mg, 0.25 mg, Intravenous,  Once, 4 of 4 cycles Administration: 0.25 mg (12/02/2019), 0.25 mg (12/16/2019), 0.25 mg (12/30/2019), 0.25 mg (01/13/2020) pegfilgrastim-cbqv (UDENYCA) injection 6 mg, 6 mg, Subcutaneous, Once, 4 of 4 cycles Administration: 6 mg (12/04/2019), 6 mg (12/18/2019), 6 mg (01/01/2020) cyclophosphamide (CYTOXAN) 1,140 mg in sodium chloride 0.9 % 250 mL chemo infusion, 600 mg/m2 = 1,140 mg, Intravenous,  Once, 4 of 4 cycles Administration: 1,140 mg (12/02/2019), 1,140 mg (12/16/2019), 1,140 mg (12/30/2019), 1,140 mg (01/13/2020) PACLitaxel (TAXOL) 150 mg in sodium chloride 0.9 % 250 mL chemo infusion (</= '80mg'$ /m2), 80 mg/m2 = 150 mg, Intravenous,  Once, 0 of 12  cycles fosaprepitant (EMEND) 150 mg in sodium chloride 0.9 % 145 mL IVPB, 150 mg, Intravenous,  Once, 4 of 4 cycles Administration: 150 mg (12/02/2019), 150 mg (12/16/2019), 150 mg (12/30/2019), 150 mg (01/13/2020)  for chemotherapy treatment.     Interval history-Mrs. Hommes is a 54 year old female with past medical history significant for benign brain tumor, COPD, depression, seizure disorder with recent diagnosis of stage IIIa ER/PR positive HER-2/neu negative right breast cancer.  She is followed by Dr. Grayland Ormond and is currently status post 4 cycles of TC.  She will begin 12 weeks of Taxol next week.  Today, she has tolerated treatment fairly well.  She did have complaints of mouth pain and occasional dysphagia.  Intermittent insomnia.  She was treated with Magic mouthwash and 0.5 mg Xanax as needed.  Today, patient presents with mouth pain.  Patient stated she woke up this morning with a swollen painful left sided tooth and gum.  States she has history of poor dental hygiene with brittle teeth and tooth decay.  She does not have a dentist.  She denies any injury to her gums or teeth.  She does remember eating a pickle approximately 2 days ago with some sensitivity noted.  She is taken Tylenol without any relief.  She denies any objective fevers.  Does not have a thermometer at home.  Does admit to feeling dizzy and weak.  Appetite is poor.  She has nausea.  Normal bowel movements and no urinary concerns.  ECOG FS:1 - Symptomatic but completely ambulatory  Review of systems- Review of Systems  Constitutional: Negative.  Negative for chills, fever, malaise/fatigue and weight loss.  HENT: Positive  for sore throat. Negative for congestion, ear pain and tinnitus.   Eyes: Negative.  Negative for blurred vision and double vision.  Respiratory: Negative.  Negative for cough, sputum production and shortness of breath.   Cardiovascular: Negative.  Negative for chest pain, palpitations and leg swelling.   Gastrointestinal: Negative.  Negative for abdominal pain, constipation, diarrhea, nausea and vomiting.  Genitourinary: Negative for dysuria, frequency and urgency.  Musculoskeletal: Negative for back pain and falls.  Skin: Negative.  Negative for rash.       Mouth pain/tooth pain  Neurological: Positive for dizziness and weakness. Negative for headaches.  Endo/Heme/Allergies: Negative.  Does not bruise/bleed easily.  Psychiatric/Behavioral: Negative.  Negative for depression. The patient is not nervous/anxious and does not have insomnia.      Current treatment-status post 4 cycles of TC.   Allergies  Allergen Reactions  . Imitrex [Sumatriptan] Anaphylaxis  . Penicillins Rash    Did it involve swelling of the face/tongue/throat, SOB, or low BP? Unknown Did it involve sudden or severe rash/hives, skin peeling, or any reaction on the inside of your mouth or nose? Unknown Did you need to seek medical attention at a hospital or doctor's office? Unknown When did it last happen?? If all above answers are "NO", may proceed with cephalosporin use.      Past Medical History:  Diagnosis Date  . Brain tumor (benign) ([place])   . Breast cancer ([place]) 1999   Status post right breast lumpectomy and radiation  . COPD (chronic obstructive pulmonary disease) ([place])   . Depression   . History of chemotherapy 1999  . History of seizure disorder   . Hypertension   . Personal history of radiation therapy 1999  . Pneumonia      Past Surgical History:  Procedure Laterality Date  . BRAIN SURGERY    . Brain tumor resection  1991  . BREAST BIOPSY Right 1999   Positive  . BREAST BIOPSY Right 11/10/2019   [place] bx 10:00 heart marker, path pending  . BREAST BIOPSY Right 11/10/2019   [place] bx axilla, hydromark 4, path pending  . BREAST BIOPSY Left 11/10/2019   [place] bx 2:00, venus marker, path pending  . PORTACATH PLACEMENT Left 11/30/2019   Procedure: INSERTION PORT-A-CATH;  Surgeon: Robert Bellow,  MD;  Location: [facility] ORS;  Service: General;  Laterality: Left;  . Right breast lumpectomy    . TONSILLECTOMY    . TUBAL LIGATION      Social History   Socioeconomic History  . Marital status: Married    Spouse name: Not on file  . Number of children: Not on file  . Years of education: Not on file  . Highest education level: Not on file  Occupational History  . Not on file  Tobacco Use  . Smoking status: Current Every Day Smoker    Packs/day: 0.50    Types: Cigarettes  . Smokeless tobacco: Never Used  Substance and Sexual Activity  . Alcohol use: No    Alcohol/week: 0.0 standard drinks  . Drug use: No    Comment: Marijuana a year ago 2020  . Sexual activity: Not on file  Other Topics Concern  . Not on file  Social History Narrative   Lives at home with husband.   Social Determinants of Health   Financial Resource Strain:   . Difficulty of Paying Living Expenses:   Food Insecurity:   . Worried About Charity fundraiser in the Last Year:   . Ran  Out of Food in the Last Year:   Transportation Needs:   . Lack of Transportation (Medical):   Marland Kitchen Lack of Transportation (Non-Medical):   Physical Activity:   . Days of Exercise per Week:   . Minutes of Exercise per Session:   Stress:   . Feeling of Stress :   Social Connections:   . Frequency of Communication with Friends and Family:   . Frequency of Social Gatherings with Friends and Family:   . Attends Religious Services:   . Active Member of Clubs or Organizations:   . Attends Archivist Meetings:   Marland Kitchen Marital Status:   Intimate Partner Violence:   . Fear of Current or Ex-Partner:   . Emotionally Abused:   Marland Kitchen Physically Abused:   . Sexually Abused:     Family History  Problem Relation Age of Onset  . Depression Mother   . CAD Father   . Hypertension Father   . Lung cancer Father   . Breast cancer Maternal Grandmother      Current Outpatient Medications:  .  acetaminophen (TYLENOL) 500 MG tablet,  Take 500-1,000 mg by mouth every 6 (six) hours as needed (for pain.)., Disp: , Rfl:  .  albuterol (VENTOLIN HFA) 108 (90 Base) MCG/ACT inhaler, Inhale 1-2 puffs into the lungs every 6 (six) hours as needed for wheezing or shortness of breath., Disp: , Rfl:  .  ALPRAZolam (XANAX) 0.5 MG tablet, Take 2 tablets (1 mg total) by mouth at bedtime as needed for anxiety., Disp: 60 tablet, Rfl: 0 .  amLODipine (NORVASC) 10 MG tablet, Take 10 mg by mouth daily. , Disp: , Rfl:  .  lidocaine-prilocaine (EMLA) cream, Apply to affected area once, Disp: 30 g, Rfl: 3 .  Melatonin 5 MG TABS, Take 1 tablet by mouth at bedtime and may repeat dose one time if needed., Disp: , Rfl:  .  ondansetron (ZOFRAN) 8 MG tablet, Take 1 tablet (8 mg total) by mouth 2 (two) times daily as needed., Disp: 60 tablet, Rfl: 2 .  prochlorperazine (COMPAZINE) 10 MG tablet, Take 1 tablet (10 mg total) by mouth every 6 (six) hours as needed (Nausea or vomiting)., Disp: 60 tablet, Rfl: 2 .  Pseudoeph-Doxylamine-DM-APAP (NYQUIL PO), Take 1-2 capsules by mouth at bedtime as needed (sleep.). , Disp: , Rfl:  .  tetrahydrozoline 0.05 % ophthalmic solution, Place 1-2 drops into both eyes 3 (three) times daily as needed (dry/irritated eyes)., Disp: , Rfl:  No current facility-administered medications for this visit.  Facility-Administered Medications Ordered in Other Visits:  .  sodium chloride flush (NS) 0.9 % injection 10 mL, 10 mL, Intravenous, PRN, Grayland Ormond, Kathlene November, MD .  sodium chloride flush (NS) 0.9 % injection 10 mL, 10 mL, Intravenous, PRN, Lloyd Huger, MD, 10 mL at 01/13/20 9563  Physical exam: There were no vitals filed for this visit. Physical Exam Constitutional:      Appearance: Normal appearance.    HENT:     Head: Normocephalic and atraumatic.     Mouth/Throat:   Eyes:     Pupils: Pupils are equal, round, and reactive to light.  Cardiovascular:     Rate and Rhythm: Normal rate and regular rhythm.     Heart  sounds: Normal heart sounds. No murmur.  Pulmonary:     Effort: Pulmonary effort is normal.     Breath sounds: Normal breath sounds. No wheezing.  Abdominal:     General: Bowel sounds are normal. There is no  distension.     Palpations: Abdomen is soft.     Tenderness: There is no abdominal tenderness.  Musculoskeletal:        General: Normal range of motion.     Cervical back: Normal range of motion.  Skin:    General: Skin is warm and dry.     Findings: No rash.  Neurological:     Mental Status: She is alert and oriented to person, place, and time.  Psychiatric:        Judgment: Judgment normal.      CMP Latest Ref Rng & Units 01/13/2020  Glucose 70 - 99 mg/dL 111(H)  BUN 6 - 20 mg/dL 9  Creatinine 0.44 - 1.00 mg/dL 0.71  Sodium 135 - 145 mmol/L 139  Potassium 3.5 - 5.1 mmol/L 3.3(L)  Chloride 98 - 111 mmol/L 106  CO2 22 - 32 mmol/L 23  Calcium 8.9 - 10.3 mg/dL 9.1  Total Protein 6.5 - 8.1 g/dL 7.3  Total Bilirubin 0.3 - 1.2 mg/dL 0.4  Alkaline Phos 38 - 126 U/L 126  AST 15 - 41 U/L 19  ALT 0 - 44 U/L 12   CBC Latest Ref Rng & Units 01/13/2020  WBC 4.0 - 10.5 K/uL 29.1(H)  Hemoglobin 12.0 - 15.0 g/dL 12.6  Hematocrit 36.0 - 46.0 % 36.2  Platelets 150 - 400 K/uL 213    No images are attached to the encounter.  No results found.   Assessment and plan- Patient is a 54 y.o. female who presents to symptom management for concerns of a tooth abscess and infection x2 days.  On assessment, patient's face, cheek and gum are swollen and painful.  Poor dentition noted.  Will check lab work for neutropenia given recent chemotherapy as she is afebrile in clinic.  She is tachycardic heart rate of 116 and tachypneic with a respiratory rate of 22.  Oxygen saturations are 99%.  She is a 9 out of 10 pain  Complains of dizziness-will hydrate with a liter of normal saline-improved. Complains of nausea-will give 8 mg Zofran-improved Complains of pain-we will give 2 mg  morphine-improved  Plan: IV hydration-Vitals improved 8 mg IV Zofran 2 mg IV morphine Lab work- ANC 2200, K+ 2.8,  RX Augmentin X 5 days RX prednisone taper  RX Norco 5-'325mg'$  q 6 hours prn for pain RX Potassium 20 meq BID X 5 days Urgent dental appointment. She was instructed to report directly to the emergency department for worsening of her symptoms.  Very low threshold for admission.  Patient wishes to remain outpatient.  Disposition: RTC as scheduled next week for md assesment, labs and Cycle 1 taxol.   Visit Diagnosis No diagnosis found.  Patient expressed understanding and was in agreement with this plan. She also understands that She can call clinic at any time with any questions, concerns, or complaints.   Greater than 50% was spent in counseling and coordination of care with this patient including but not limited to discussion of the relevant topics above (See A&P) including, but not limited to diagnosis and management of acute and chronic medical conditions.   Thank you for allowing me to participate in the care of this very pleasant patient.    Jacquelin Hawking, NP [place] [facility] Cell - [phone] Pager- [phone] 01/22/2020 1:11 PM

## 2020-01-22 NOTE — Telephone Encounter (Signed)
Patient called reporting that she thinks she has an infected tooth and is asking what she should do. Please advise

## 2020-01-22 NOTE — Progress Notes (Signed)
Pt here reporting left lower tooth pain and gum swelling that has bene there for several days, as well as dizziness off and on for two days, intermittent nausea, and feeling hot and cold.

## 2020-01-22 NOTE — Progress Notes (Signed)
Port a cath de accessed by Judson Roch, Therapist, sports

## 2020-01-23 NOTE — Progress Notes (Signed)
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  INTERVAL HISTORY: Patient returns to clinic today for further evaluation and consideration of cycle 1 of weekly Taxol.  She recently was seen in symptom management clinic for tooth abscess and is currently taking antibiotics.  She continues to have mouth pain, but her swelling and pain have significantly improved.  She has increased weakness and fatigue.   She has no neurologic complaints.  She denies any recent fevers or illnesses.  She has a poor appetite and denies weight loss.  She has no chest pain, shortness of breath, cough, or hemoptysis.  She denies any nausea, vomiting, constipation, or diarrhea.  She has no urinary complaints.  Patient offers no further specific complaints today.  REVIEW OF SYSTEMS:   Review of Systems  Constitutional: Negative.  Negative for fever, malaise/fatigue and weight loss.  HENT: Positive for sore throat.   Respiratory: Negative.  Negative for cough, hemoptysis and shortness of breath.   Cardiovascular: Negative.  Negative for chest pain and leg swelling.  Gastrointestinal: Negative.  Negative for abdominal pain.  Genitourinary: Negative.  Negative for dysuria.  Musculoskeletal: Positive for myalgias. Negative for back pain.  Skin: Negative.  Negative for rash.  Neurological: Negative for dizziness, focal weakness, weakness and headaches.  Psychiatric/Behavioral: The patient has insomnia. The patient is not nervous/anxious.     As per HPI. Otherwise, a  complete review of systems is negative.  PAST MEDICAL HISTORY: Past Medical History:  Diagnosis Date  . Brain tumor (benign) ([place])   . Breast cancer ([place]) 1999   Status post right breast lumpectomy and radiation  . COPD (chronic obstructive pulmonary disease) ([place])   . Depression   . History of chemotherapy 1999  . History of seizure disorder   . Hypertension   . Personal history of radiation therapy 1999  . Pneumonia     PAST SURGICAL HISTORY: Past Surgical History:  Procedure Laterality Date  . BRAIN SURGERY    . Brain tumor resection  1991  . BREAST BIOPSY Right 1999   Positive  . BREAST BIOPSY Right 11/10/2019   [place] bx 10:00 heart marker, path pending  . BREAST BIOPSY Right 11/10/2019   [place] bx axilla, hydromark 4, path pending  . BREAST BIOPSY Left 11/10/2019   [place] bx 2:00, venus marker, path pending  . PORTACATH PLACEMENT Left 11/30/2019   Procedure: INSERTION PORT-A-CATH;  Surgeon: Robert Bellow, MD;  Location: [facility] ORS;  Service: General;  Laterality: Left;  . Right breast lumpectomy    . TONSILLECTOMY    . TUBAL LIGATION      FAMILY HISTORY: Family History  Problem Relation Age of Onset  . Depression Mother   . CAD Father   . Hypertension Father   . Lung cancer Father   . Breast cancer Maternal Grandmother     ADVANCED DIRECTIVES (Y/N):  N  HEALTH MAINTENANCE: Social History   Tobacco Use  . Smoking status: Current Every Day Smoker    Packs/day: 0.50    Types: Cigarettes  . Smokeless tobacco: Never Used  Substance Use Topics  .  Alcohol use: No    Alcohol/week: 0.0 standard drinks  . Drug use: No    Comment: Marijuana a year ago 2020     Colonoscopy:  PAP:  Bone density:  Lipid panel:  Allergies  Allergen Reactions  . Imitrex [Sumatriptan] Anaphylaxis  . Penicillins Rash    Did it involve swelling of the face/tongue/throat, SOB, or low BP? Unknown Did it involve sudden or severe rash/hives, skin peeling, or any reaction on the inside  of your mouth or nose? Unknown Did you need to seek medical attention at a hospital or doctor's office? Unknown When did it last happen?? If all above answers are "NO", may proceed with cephalosporin use.     Current Outpatient Medications  Medication Sig Dispense Refill  . acetaminophen (TYLENOL) 500 MG tablet Take 500-1,000 mg by mouth every 6 (six) hours as needed (for pain.).    Marland Kitchen albuterol (VENTOLIN HFA) 108 (90 Base) MCG/ACT inhaler Inhale 1-2 puffs into the lungs every 6 (six) hours as needed for wheezing or shortness of breath.    . ALPRAZolam (XANAX) 0.5 MG tablet Take 2 tablets (1 mg total) by mouth at bedtime as needed for anxiety. 60 tablet 0  . amLODipine (NORVASC) 10 MG tablet Take 10 mg by mouth daily.     Marland Kitchen amoxicillin-clavulanate (AUGMENTIN) 875-125 MG tablet Take 1 tablet by mouth 2 (two) times daily. 20 tablet 0  . HYDROcodone-acetaminophen (NORCO) 5-325 MG tablet Take 1 tablet by mouth every 6 (six) hours as needed for moderate pain. 30 tablet 0  . lidocaine-prilocaine (EMLA) cream Apply to affected area once 30 g 3  . Melatonin 5 MG TABS Take 1 tablet by mouth at bedtime and may repeat dose one time if needed.    . ondansetron (ZOFRAN) 8 MG tablet Take 1 tablet (8 mg total) by mouth 2 (two) times daily as needed. 60 tablet 2  . predniSONE (STERAPRED UNI-PAK 21 TAB) 10 MG (21) TBPK tablet Take as directed 21 tablet 0  . prochlorperazine (COMPAZINE) 10 MG tablet Take 1 tablet (10 mg total) by mouth every 6 (six) hours as needed (Nausea or vomiting). 60 tablet 2  . Pseudoeph-Doxylamine-DM-APAP (NYQUIL PO) Take 1-2 capsules by mouth at bedtime as needed (sleep.).     Marland Kitchen tetrahydrozoline 0.05 % ophthalmic solution Place 1-2 drops into both eyes 3 (three) times daily as needed (dry/irritated eyes).    . potassium chloride SA (KLOR-CON) 20 MEQ tablet Take 1 tablet (20 mEq total) by mouth 2 (two) times daily. (Patient not taking: Reported on 01/27/2020) 10 tablet 0   No current  facility-administered medications for this visit.   Facility-Administered Medications Ordered in Other Visits  Medication Dose Route Frequency Provider Last Rate Last Admin  . sodium chloride flush (NS) 0.9 % injection 10 mL  10 mL Intravenous PRN Lloyd Huger, MD      . sodium chloride flush (NS) 0.9 % injection 10 mL  10 mL Intravenous PRN Lloyd Huger, MD   10 mL at 01/13/20 0851    OBJECTIVE: Vitals:   01/27/20 0856  BP: (!) 141/94  Pulse: (!) 107  Temp: 97.7 F (36.5 C)  SpO2: 99%     Body mass index is 25.53 kg/m.    ECOG FS:0 - Asymptomatic  General: Thin, no acute distress. Eyes: Pink conjunctiva, anicteric sclera.  Mild swelling along the left jawline, without erythema or drainage. HEENT: Normocephalic, moist mucous membranes. Lungs: No audible wheezing or coughing. Heart: Regular rate and  rhythm. Abdomen: Soft, nontender, no obvious distention. Musculoskeletal: No edema, cyanosis, or clubbing. Neuro: Alert, answering all questions appropriately. Cranial nerves grossly intact. Skin: No rashes or petechiae noted. Psych: Normal affect.   LAB RESULTS:  Lab Results  Component Value Date   NA 134 (L) 01/27/2020   K 3.0 (L) 01/27/2020   CL 101 01/27/2020   CO2 24 01/27/2020   GLUCOSE 102 (H) 01/27/2020   BUN 13 01/27/2020   CREATININE 0.70 01/27/2020   CALCIUM 9.2 01/27/2020   PROT 7.2 01/27/2020   ALBUMIN 3.9 01/27/2020   AST 23 01/27/2020   ALT 30 01/27/2020   ALKPHOS 90 01/27/2020   BILITOT 0.4 01/27/2020   GFRNONAA >60 01/27/2020   GFRAA >60 01/27/2020    Lab Results  Component Value Date   WBC 3.2 (L) 01/27/2020   NEUTROABS 0.9 (L) 01/27/2020   HGB 11.5 (L) 01/27/2020   HCT 32.9 (L) 01/27/2020   MCV 89.2 01/27/2020   PLT 260 01/27/2020     STUDIES: No results found.  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  PLAN:    1. Stage IIIa ER/PR positive, HER-2  negative invasive carcinoma of the upper outer quadrant right breast: Imaging and pathology reviewed independently.  Case also discussed with surgery.  Given the size and stage of patient's tumor, she will benefit from neoadjuvant chemotherapy with Adriamycin, Cytoxan and Udenyca support followed by weekly Taxol.  MUGA scan from November 26, 2019 revealed an EF of approximately 61%. CT scan of the chest, abdomen, and pelvis on December 01, 2019 reviewed independently with no obvious evidence of metastatic disease.  Patient has now had port placement.  Patient has now completed Adriamycin and Cytoxan.  Will delay cycle 1 of Taxol secondary to ongoing tooth abscess.  Patient will instead receive 1 L IV fluids.  She has been instructed to complete her antibiotic course and return to clinic in 1 week for reconsideration of cycle 1 of 12 of Taxol.    2.  History of DCIS: Status post lumpectomy and XRT.  Patient reports she did not take any adjuvant hormonal therapy.  Given this history, have referred patient to genetic counseling for evaluation and testing. 3.  Anxiety/insomnia: Continue 0.5 mg Xanax as needed.  Patient was instructed that she can increase to 1 mg nightly if necessary. 4.  Neutropenia: Mild.  Delay treatment 1 week as above. 5.  Headache: Patient does not complain of this today. 6.  Hypokalemia: Improved.  Patient was previously given dietary recommendations.  Consider oral supplementation if no improvement at next lab draw. 7.  Tooth abscess: Continue antibiotics as prescribed. 8.  Back pain: Patient was instructed to use ibuprofen sparingly. 9.  Myalgias/restless leg: Possibly secondary to Udenyca.  Patient expressed understanding and was in agreement with this plan. She also understands that She can call clinic at any time with any questions, concerns, or complaints.   Cancer Staging Primary cancer of upper outer quadrant of right female breast [facility]) Staging form: Breast, AJCC 8th Edition -  Clinical stage from 11/26/2019: Stage IIIA (cT2, cN2a, cM0, G3, ER+, PR+, HER2-) - Signed by Lloyd Huger, MD on 11/26/2019   Lloyd Huger, MD   01/27/2020 2:34 PM

## 2020-01-25 ENCOUNTER — Telehealth: Payer: Self-pay | Admitting: Oncology

## 2020-01-25 NOTE — Telephone Encounter (Signed)
Re: Follow-up Mouth pain  Called and spoke with patient this morning to see how she was feeling.  Patient states she continues to have swelling to her left cheek and gum.  Pain is about the same.  Swelling has improved some.  She is unable to take her potassium supplements because she becomes nauseated.  She is drinking plenty of fluids and frequently urinates.  She declines additional IV fluids today in clinic.  States she is able to consume soft foods.  She has been compliant with her antibiotics and steroids.  She is taking her pain medicine as prescribed with some relief.  She is scheduled to return to clinic on Wednesday for evaluation prior to next cycle of chemotherapy.  I encouraged her to call clinic if symptoms worsened or fail to improve.  Faythe Casa, NP 01/25/2020 9:47 AM

## 2020-01-26 ENCOUNTER — Other Ambulatory Visit: Payer: Self-pay

## 2020-01-26 DIAGNOSIS — C50919 Malignant neoplasm of unspecified site of unspecified female breast: Secondary | ICD-10-CM

## 2020-01-27 ENCOUNTER — Other Ambulatory Visit: Payer: Self-pay

## 2020-01-27 ENCOUNTER — Inpatient Hospital Stay: Payer: Medicaid Other

## 2020-01-27 ENCOUNTER — Telehealth: Payer: Self-pay | Admitting: Oncology

## 2020-01-27 ENCOUNTER — Encounter: Payer: Self-pay | Admitting: Oncology

## 2020-01-27 ENCOUNTER — Inpatient Hospital Stay (HOSPITAL_BASED_OUTPATIENT_CLINIC_OR_DEPARTMENT_OTHER): Payer: Medicaid Other | Admitting: Oncology

## 2020-01-27 VITALS — BP 141/94 | HR 107 | Temp 97.7°F | Wt 163.0 lb

## 2020-01-27 DIAGNOSIS — C50919 Malignant neoplasm of unspecified site of unspecified female breast: Secondary | ICD-10-CM

## 2020-01-27 DIAGNOSIS — C50411 Malignant neoplasm of upper-outer quadrant of right female breast: Secondary | ICD-10-CM

## 2020-01-27 DIAGNOSIS — Z006 Encounter for examination for normal comparison and control in clinical research program: Secondary | ICD-10-CM | POA: Diagnosis not present

## 2020-01-27 DIAGNOSIS — Z5111 Encounter for antineoplastic chemotherapy: Secondary | ICD-10-CM | POA: Diagnosis not present

## 2020-01-27 LAB — COMPREHENSIVE METABOLIC PANEL
ALT: 30 U/L (ref 0–44)
AST: 23 U/L (ref 15–41)
Albumin: 3.9 g/dL (ref 3.5–5.0)
Alkaline Phosphatase: 90 U/L (ref 38–126)
Anion gap: 9 (ref 5–15)
BUN: 13 mg/dL (ref 6–20)
CO2: 24 mmol/L (ref 22–32)
Calcium: 9.2 mg/dL (ref 8.9–10.3)
Chloride: 101 mmol/L (ref 98–111)
Creatinine, Ser: 0.7 mg/dL (ref 0.44–1.00)
GFR calc Af Amer: >60 mL/min (ref >60)
GFR calc non Af Amer: >60 mL/min (ref >60)
Glucose, Bld: 102 mg/dL — ABNORMAL HIGH (ref 70–99)
Potassium: 3 mmol/L — ABNORMAL LOW (ref 3.5–5.1)
Sodium: 134 mmol/L — ABNORMAL LOW (ref 135–145)
Total Bilirubin: 0.4 mg/dL (ref 0.3–1.2)
Total Protein: 7.2 g/dL (ref 6.5–8.1)

## 2020-01-27 LAB — CBC WITH DIFFERENTIAL/PLATELET
Abs Immature Granulocytes: 0.02 10*3/uL (ref 0.00–0.07)
Basophils Absolute: 0.1 10*3/uL (ref 0.0–0.1)
Basophils Relative: 2 %
Eosinophils Absolute: 0 10*3/uL (ref 0.0–0.5)
Eosinophils Relative: 1 %
HCT: 32.9 % — ABNORMAL LOW (ref 36.0–46.0)
Hemoglobin: 11.5 g/dL — ABNORMAL LOW (ref 12.0–15.0)
Immature Granulocytes: 1 %
Lymphocytes Relative: 36 %
Lymphs Abs: 1.2 10*3/uL (ref 0.7–4.0)
MCH: 31.2 pg (ref 26.0–34.0)
MCHC: 35 g/dL (ref 30.0–36.0)
MCV: 89.2 fL (ref 80.0–100.0)
Monocytes Absolute: 0.9 10*3/uL (ref 0.1–1.0)
Monocytes Relative: 30 %
Neutro Abs: 0.9 10*3/uL — ABNORMAL LOW (ref 1.7–7.7)
Neutrophils Relative %: 30 %
Platelets: 260 10*3/uL (ref 150–400)
RBC: 3.69 MIL/uL — ABNORMAL LOW (ref 3.87–5.11)
RDW: 15.5 % (ref 11.5–15.5)
WBC: 3.2 10*3/uL — ABNORMAL LOW (ref 4.0–10.5)
nRBC: 0 % (ref 0.0–0.2)

## 2020-01-27 MED ORDER — SODIUM CHLORIDE 0.9 % IV SOLN
Freq: Once | INTRAVENOUS | Status: AC
  Filled 2020-01-27: qty 250

## 2020-01-27 MED ORDER — HEPARIN SOD (PORK) LOCK FLUSH 100 UNIT/ML IV SOLN
500.0000 [IU] | Freq: Once | INTRAVENOUS | Status: AC
  Administered 2020-01-27: 500 [IU] via INTRAVENOUS
  Filled 2020-01-27: qty 5

## 2020-01-27 MED ORDER — SODIUM CHLORIDE 0.9% FLUSH
10.0000 mL | Freq: Once | INTRAVENOUS | Status: AC
  Administered 2020-01-27: 10 mL via INTRAVENOUS
  Filled 2020-01-27: qty 10

## 2020-01-27 NOTE — Progress Notes (Signed)
Patient here today for follow up. States she is having some weakness and pain in left side of mouth due to abscessed tooth. She rates pain today at 5. Duration since last Wednesday. She also states she has been experiencing some dizziness. She is currently taking antibiotics and pain meds to help with tooth. She also complains of herpes like bump on right side of lower lip.

## 2020-01-27 NOTE — Research (Signed)
SWOG [id number] Peripheral Neuropathy Protocol Consent Visit:  Patient in to the cancer center today unaccompanied for her first Taxol treatment and [id number] consent visit. The protocol consent was presented to the patient with specific explanation of the protocol treatment, alternatives, potential risks, side effects, and potential benefits. The patients questions were answered to her reported satisfaction. The patient was informed that her participation in the clinical trial is voluntary, she will not be paid, and she can withdraw from her participation at any time if she so chooses. The patient signed the consent and [employer] version dated 07/12/2019 as well as the optional lab studies, registration procedures will now be initiated. The patient was provided a copy of the signed informed consent for her records. Dr. Grayland Ormond examined the patient and decided to withhold her first Taxol treatment today due to her continued tooth infection, and need for hydration. She will be re-scheduled to start in one week. Research RN explained to the patient that we will begin her schedule on protocol with that appointment and complete all of her baseline assessments at that time. She is in agreement with all plans currently. Research RN thanked the patient for her willingness to participate in the clinical trials.   Jeral Fruit, RN, BSN, OCN 01/27/2020  0908 am

## 2020-01-29 NOTE — Progress Notes (Signed)
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  INTERVAL HISTORY: Patient returns to clinic today for further evaluation and reconsideration of cycle 1 of weekly Taxol.  Her tooth abscess and mouth pain have now resolved.  Her weakness and fatigue have improved as well.  She has no neurologic complaints.  She denies any recent fevers or illnesses.  Her appetite has improved and she denies weight loss.  She has no chest pain, shortness of breath, cough, or hemoptysis.  She denies any nausea, vomiting, constipation, or diarrhea.  She has no urinary complaints.  Patient offers no further specific complaints today.  REVIEW OF SYSTEMS:   Review of Systems  Constitutional: Positive for malaise/fatigue. Negative for fever and weight loss.  HENT: Negative for sore throat.   Respiratory: Negative.  Negative for cough, hemoptysis and shortness of breath.   Cardiovascular: Negative.  Negative for chest pain and leg swelling.  Gastrointestinal: Negative.  Negative for abdominal pain.  Genitourinary: Negative.  Negative for dysuria.  Musculoskeletal: Negative.  Negative for back pain and myalgias.  Skin: Negative.  Negative for rash.  Neurological: Positive for weakness. Negative for dizziness, focal weakness and headaches.  Psychiatric/Behavioral: The patient has insomnia. The patient is not nervous/anxious.     As per HPI. Otherwise, a complete review of systems is negative.  PAST MEDICAL HISTORY: Past Medical History:  Diagnosis Date   Brain  tumor (benign) ([place])    Breast cancer ([place]) 1999   Status post right breast lumpectomy and radiation   COPD (chronic obstructive pulmonary disease) ([place])    Depression    History of chemotherapy 1999   History of seizure disorder    Hypertension    Personal history of radiation therapy 1999   Pneumonia     PAST SURGICAL HISTORY: Past Surgical History:  Procedure Laterality Date   BRAIN SURGERY     Brain tumor resection  1991   BREAST BIOPSY Right 1999   Positive   BREAST BIOPSY Right 11/10/2019   [place] bx 10:00 heart marker, path pending   BREAST BIOPSY Right 11/10/2019   [place] bx axilla, hydromark 4, path pending   BREAST BIOPSY Left 11/10/2019   [place] bx 2:00, venus marker, path pending   PORTACATH PLACEMENT Left 11/30/2019   Procedure: INSERTION PORT-A-CATH;  Surgeon: Robert Bellow, MD;  Location: [facility] ORS;  Service: General;  Laterality: Left;   Right breast lumpectomy     TONSILLECTOMY     TUBAL LIGATION      FAMILY HISTORY: Family History  Problem Relation Age of Onset   Depression Mother    CAD Father    Hypertension Father    Lung cancer Father    Breast cancer Maternal Grandmother     ADVANCED DIRECTIVES (Y/N):  N  HEALTH MAINTENANCE: Social History   Tobacco Use   Smoking status: Current Every Day Smoker    Packs/day: 0.50    Types: Cigarettes   Smokeless tobacco: Never Used  Substance Use Topics   Alcohol use: No    Alcohol/week: 0.0 standard drinks   Drug use: No  Comment: Marijuana a year ago 2020     Colonoscopy:  PAP:  Bone density:  Lipid panel:  Allergies  Allergen Reactions   Imitrex [Sumatriptan] Anaphylaxis   Penicillins Rash    Did it involve swelling of the face/tongue/throat, SOB, or low BP? Unknown Did it involve sudden or severe rash/hives, skin peeling, or any reaction on the inside of your mouth or nose? Unknown Did you need to seek medical attention at a hospital or doctor's office?  Unknown When did it last happen?? If all above answers are NO, may proceed with cephalosporin use.     Current Outpatient Medications  Medication Sig Dispense Refill   acetaminophen (TYLENOL) 500 MG tablet Take 500-1,000 mg by mouth every 6 (six) hours as needed (for pain.).     albuterol (VENTOLIN HFA) 108 (90 Base) MCG/ACT inhaler Inhale 1-2 puffs into the lungs every 6 (six) hours as needed for wheezing or shortness of breath.     ALPRAZolam (XANAX) 0.5 MG tablet Take 2 tablets (1 mg total) by mouth at bedtime as needed for anxiety. 60 tablet 0   amLODipine (NORVASC) 10 MG tablet Take 10 mg by mouth daily.      amoxicillin-clavulanate (AUGMENTIN) 875-125 MG tablet Take 1 tablet by mouth 2 (two) times daily. 20 tablet 0   HYDROcodone-acetaminophen (NORCO) 5-325 MG tablet Take 1 tablet by mouth every 6 (six) hours as needed for moderate pain. 30 tablet 0   lidocaine-prilocaine (EMLA) cream Apply to affected area once 30 g 3   Melatonin 5 MG TABS Take 1 tablet by mouth at bedtime and may repeat dose one time if needed.     ondansetron (ZOFRAN) 8 MG tablet TAKE 1 TABLET BY MOUTH 2 TIMES DAILY AS NEEDED 60 tablet 2   potassium chloride SA (KLOR-CON) 20 MEQ tablet Take 1 tablet (20 mEq total) by mouth 2 (two) times daily. 10 tablet 0   predniSONE (STERAPRED UNI-PAK 21 TAB) 10 MG (21) TBPK tablet Take as directed 21 tablet 0   prochlorperazine (COMPAZINE) 10 MG tablet TAKE 1 TABLET BY MOUTH EVERY 6 HOURS AS NEEDED (NAUSEA OR VOMITING) 60 tablet 2   Pseudoeph-Doxylamine-DM-APAP (NYQUIL PO) Take 1-2 capsules by mouth at bedtime as needed (sleep.).      tetrahydrozoline 0.05 % ophthalmic solution Place 1-2 drops into both eyes 3 (three) times daily as needed (dry/irritated eyes).     No current facility-administered medications for this visit.   Facility-Administered Medications Ordered in Other Visits  Medication Dose Route Frequency Provider Last Rate Last Admin   sodium  chloride flush (NS) 0.9 % injection 10 mL  10 mL Intravenous PRN Lloyd Huger, MD       sodium chloride flush (NS) 0.9 % injection 10 mL  10 mL Intravenous PRN Lloyd Huger, MD   10 mL at 01/13/20 0851    OBJECTIVE: Vitals:   02/03/20 0856  BP: (!) 135/97  Pulse: (!) 113  Resp: 20  Temp: (!) 96.5 F (35.8 C)  SpO2: 98%     Body mass index is 24.98 kg/m.    ECOG FS:0 - Asymptomatic  General: Thin, no acute distress. Eyes: Pink conjunctiva, anicteric sclera. HEENT: Normocephalic, moist mucous membranes.  Jaw swelling has resolved. Lungs: No audible wheezing or coughing. Heart: Regular rate and rhythm. Abdomen: Soft, nontender, no obvious distention. Musculoskeletal: No edema, cyanosis, or clubbing. Neuro: Alert, answering all questions appropriately. Cranial nerves grossly intact. Skin: No rashes or petechiae noted. Psych: Normal affect.  LAB RESULTS:  Lab Results  Component Value Date   NA 137 02/03/2020   K 3.0 (L) 02/03/2020   CL 102 02/03/2020   CO2 23 02/03/2020   GLUCOSE 116 (H) 02/03/2020   BUN 7 02/03/2020   CREATININE 0.58 02/03/2020   CALCIUM 9.3 02/03/2020   PROT 7.7 02/03/2020   ALBUMIN 4.1 02/03/2020   AST 21 02/03/2020   ALT 20 02/03/2020   ALKPHOS 102 02/03/2020   BILITOT 0.5 02/03/2020   GFRNONAA >60 02/03/2020   GFRAA >60 02/03/2020    Lab Results  Component Value Date   WBC 12.3 (H) 02/03/2020   NEUTROABS 9.2 (H) 02/03/2020   HGB 12.9 02/03/2020   HCT 37.5 02/03/2020   MCV 90.6 02/03/2020   PLT 371 02/03/2020     STUDIES: No results found.  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  PLAN:    1. 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  Case also discussed with surgery.  Given the size and stage of patient's tumor, she will benefit from  neoadjuvant chemotherapy with Adriamycin, Cytoxan and Udenyca support followed by weekly Taxol.  MUGA scan from November 26, 2019 revealed an EF of approximately 61%. CT scan of the chest, abdomen, and pelvis on December 01, 2019 reviewed independently with no obvious evidence of metastatic disease.  Patient has now had port placement.  Patient has now completed Adriamycin and Cytoxan.  Proceed with cycle 1 of 12 of weekly Taxol today.  Return to clinic in 1 week for further evaluation and consideration of cycle 2.     2.  History of DCIS: Status post lumpectomy and XRT.  Patient reports she did not take any adjuvant hormonal therapy.  Given this history, have referred patient to genetic counseling for evaluation and testing. 3.  Anxiety/insomnia: Continue 0.5 mg Xanax as needed.  Patient was instructed that she can increase to 1 mg nightly if necessary. 4.  Neutropenia: Resolved. 5.  Headache: Patient does not complain of this today. 6.  Hypokalemia: Patient was previously given oral supplementation, which she was not taking.  Have instructed to reinitiate treatment. 7.  Tooth abscess: Resolved.  Patient will require an appointment with dentistry or oral surgery at the conclusion of her treatments. 8.  Back pain: Patient was instructed to use ibuprofen sparingly. 9.  Myalgias/restless leg: Patient does not complain of this today.  Patient expressed understanding and was in agreement with this plan. She also understands that She can call clinic at any time with any questions, concerns, or complaints.   Cancer Staging Primary cancer of upper outer quadrant of right female breast [facility]) Staging form: Breast, AJCC 8th Edition - Clinical stage from 11/26/2019: Stage IIIA (cT2, cN2a, cM0, G3, ER+, PR+, HER2-) - Signed by Lloyd Huger, MD on 11/26/2019   Lloyd Huger, MD   02/04/2020 6:45 AM

## 2020-01-29 NOTE — Telephone Encounter (Signed)
error 

## 2020-02-02 ENCOUNTER — Other Ambulatory Visit: Payer: Self-pay | Admitting: Oncology

## 2020-02-02 DIAGNOSIS — C50411 Malignant neoplasm of upper-outer quadrant of right female breast: Secondary | ICD-10-CM

## 2020-02-02 NOTE — Progress Notes (Signed)
Called patient to go prescreening questions. Patient stated she was having a headache and shortness of breathe with movement.

## 2020-02-03 ENCOUNTER — Encounter: Payer: Self-pay | Admitting: Oncology

## 2020-02-03 ENCOUNTER — Inpatient Hospital Stay: Payer: Medicaid Other

## 2020-02-03 ENCOUNTER — Inpatient Hospital Stay (HOSPITAL_BASED_OUTPATIENT_CLINIC_OR_DEPARTMENT_OTHER): Payer: Medicaid Other | Admitting: Oncology

## 2020-02-03 ENCOUNTER — Inpatient Hospital Stay: Payer: Medicaid Other | Attending: Oncology

## 2020-02-03 ENCOUNTER — Other Ambulatory Visit: Payer: Self-pay

## 2020-02-03 VITALS — BP 132/84 | HR 93 | Resp 18

## 2020-02-03 VITALS — BP 135/97 | HR 113 | Temp 96.5°F | Resp 20 | Wt 159.5 lb

## 2020-02-03 DIAGNOSIS — C50411 Malignant neoplasm of upper-outer quadrant of right female breast: Secondary | ICD-10-CM | POA: Diagnosis not present

## 2020-02-03 DIAGNOSIS — Z006 Encounter for examination for normal comparison and control in clinical research program: Secondary | ICD-10-CM | POA: Diagnosis not present

## 2020-02-03 DIAGNOSIS — Z5111 Encounter for antineoplastic chemotherapy: Secondary | ICD-10-CM | POA: Diagnosis present

## 2020-02-03 LAB — COMPREHENSIVE METABOLIC PANEL
ALT: 20 U/L (ref 0–44)
AST: 21 U/L (ref 15–41)
Albumin: 4.1 g/dL (ref 3.5–5.0)
Alkaline Phosphatase: 102 U/L (ref 38–126)
Anion gap: 12 (ref 5–15)
BUN: 7 mg/dL (ref 6–20)
CO2: 23 mmol/L (ref 22–32)
Calcium: 9.3 mg/dL (ref 8.9–10.3)
Chloride: 102 mmol/L (ref 98–111)
Creatinine, Ser: 0.58 mg/dL (ref 0.44–1.00)
GFR calc Af Amer: >60 mL/min (ref >60)
GFR calc non Af Amer: >60 mL/min (ref >60)
Glucose, Bld: 116 mg/dL — ABNORMAL HIGH (ref 70–99)
Potassium: 3 mmol/L — ABNORMAL LOW (ref 3.5–5.1)
Sodium: 137 mmol/L (ref 135–145)
Total Bilirubin: 0.5 mg/dL (ref 0.3–1.2)
Total Protein: 7.7 g/dL (ref 6.5–8.1)

## 2020-02-03 LAB — CBC WITH DIFFERENTIAL/PLATELET
Abs Immature Granulocytes: 0.18 10*3/uL — ABNORMAL HIGH (ref 0.00–0.07)
Basophils Absolute: 0.1 10*3/uL (ref 0.0–0.1)
Basophils Relative: 1 %
Eosinophils Absolute: 0.1 10*3/uL (ref 0.0–0.5)
Eosinophils Relative: 1 %
HCT: 37.5 % (ref 36.0–46.0)
Hemoglobin: 12.9 g/dL (ref 12.0–15.0)
Immature Granulocytes: 2 %
Lymphocytes Relative: 10 %
Lymphs Abs: 1.2 10*3/uL (ref 0.7–4.0)
MCH: 31.2 pg (ref 26.0–34.0)
MCHC: 34.4 g/dL (ref 30.0–36.0)
MCV: 90.6 fL (ref 80.0–100.0)
Monocytes Absolute: 1.7 10*3/uL — ABNORMAL HIGH (ref 0.1–1.0)
Monocytes Relative: 13 %
Neutro Abs: 9.2 10*3/uL — ABNORMAL HIGH (ref 1.7–7.7)
Neutrophils Relative %: 73 %
Platelets: 371 10*3/uL (ref 150–400)
RBC: 4.14 MIL/uL (ref 3.87–5.11)
RDW: 16.4 % — ABNORMAL HIGH (ref 11.5–15.5)
WBC: 12.3 10*3/uL — ABNORMAL HIGH (ref 4.0–10.5)
nRBC: 0 % (ref 0.0–0.2)

## 2020-02-03 MED ORDER — DIPHENHYDRAMINE HCL 50 MG/ML IJ SOLN
25.0000 mg | Freq: Once | INTRAMUSCULAR | Status: AC
  Administered 2020-02-03: 25 mg via INTRAVENOUS
  Filled 2020-02-03: qty 1

## 2020-02-03 MED ORDER — SODIUM CHLORIDE 0.9 % IV SOLN
10.0000 mg | Freq: Once | INTRAVENOUS | Status: AC
  Administered 2020-02-03: 10 mg via INTRAVENOUS
  Filled 2020-02-03: qty 10

## 2020-02-03 MED ORDER — SODIUM CHLORIDE 0.9 % IV SOLN
80.0000 mg/m2 | Freq: Once | INTRAVENOUS | Status: AC
  Administered 2020-02-03: 150 mg via INTRAVENOUS
  Filled 2020-02-03: qty 25

## 2020-02-03 MED ORDER — SODIUM CHLORIDE 0.9 % IV SOLN
Freq: Once | INTRAVENOUS | Status: AC
  Filled 2020-02-03: qty 250

## 2020-02-03 MED ORDER — SODIUM CHLORIDE 0.9% FLUSH
10.0000 mL | INTRAVENOUS | Status: DC | PRN
  Administered 2020-02-03: 10 mL via INTRAVENOUS
  Filled 2020-02-03: qty 10

## 2020-02-03 MED ORDER — HEPARIN SOD (PORK) LOCK FLUSH 100 UNIT/ML IV SOLN
INTRAVENOUS | Status: AC
  Filled 2020-02-03: qty 5

## 2020-02-03 MED ORDER — FAMOTIDINE IN NACL 20-0.9 MG/50ML-% IV SOLN
20.0000 mg | Freq: Once | INTRAVENOUS | Status: AC
  Administered 2020-02-03: 20 mg via INTRAVENOUS
  Filled 2020-02-03: qty 50

## 2020-02-03 MED ORDER — HEPARIN SOD (PORK) LOCK FLUSH 100 UNIT/ML IV SOLN
500.0000 [IU] | Freq: Once | INTRAVENOUS | Status: AC
  Administered 2020-02-03: 500 [IU] via INTRAVENOUS
  Filled 2020-02-03: qty 5

## 2020-02-03 NOTE — Research (Signed)
SWOG [id number] Baseline Visit:   Patient in to the cancer center unaccompanied for her first Taxol treatment and baseline visit for S1714 Neuropathy Study. Protocol mandatory and optional central labs were drawn from her left chest port and taken to the lab for processing at 0832 am. The patient completed her protocol questionnaires while she was waiting to see Dr. Grayland Ormond without any difficulty. The neuropathy assessment was then completed in an exam room with Yolande Jolly, RN (research nurse) to assist with times. Dr. Grayland Ormond examined patient and deemed alright to receive her first treatment, no further signs of infection. Dr. Grayland Ormond completed the baseline physician CTCAE assessment and on-study form for the protocol. The patient does not have any peripheral motor or sensory neuropathy, parasthesia, neuralgia or dysesthesia at baseline. Her feet are extremely sensitive to the microfilament and sharp instruments used for the testing. She has no complaints of adverse effects from her prior chemotherapy treatment at this time. Patient was escorted to the infusion room for her scheduled treatment and report given to [place], Therapist, sports.  Specific review of central lab procedures prior to the end of the Taxol treatment provided, infusion sign provided with research RN phone number for her to call when time is close. Patient is aware of all procedures required and is in agreement with participation.   Allyson, RN called to inform research RN that Taxol would be completed in about 10 minutes at 1320, research RN carried required optional central lab collection equipment to the infusion room. Central labs were collected via peripheral right AC 22 gauge at 1319 per protocol requirements, green tube on ice, vials labeled with date and time of collection. The Taxol infusion began at 1156 am and completed at 1329 pm via her left chest port. The patients research schedule was reviewed in detail with her to her voiced  satisfaction. She was encouraged to call the research department at any time if she has any questions or concerns. The patient is aware the central labs will be repeated again in [redacted] weeks along with the neuropathy assessment. She denies any problems or concerns currently.   Jeral Fruit, RN, BSN, OCN 1536 pm

## 2020-02-05 NOTE — Progress Notes (Signed)
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  INTERVAL HISTORY: Patient returns to clinic today for further evaluation and consideration of cycle 2 of weekly Taxol.  She tolerated her first treatment well without significant side effects.  This past weekend, she did notice some heart palpitations and states she fainted x2.  She continues to have chronic weakness and fatigue.  She has no other neurologic complaints.  She denies any recent fevers or illnesses.  Her appetite has improved and she denies weight loss.  She has no chest pain, shortness of breath, cough, or hemoptysis.  She denies any nausea, vomiting, constipation, or diarrhea.  She has no urinary complaints.  Patient offers no further specific complaints today.  REVIEW OF SYSTEMS:   Review of Systems  Constitutional: Positive for malaise/fatigue. Negative for fever and weight loss.  HENT: Negative for sore throat.   Respiratory: Negative.  Negative for cough, hemoptysis and shortness of breath.   Cardiovascular: Positive for palpitations. Negative for chest pain and leg swelling.  Gastrointestinal: Negative.  Negative for abdominal pain.  Genitourinary: Negative.  Negative for dysuria.  Musculoskeletal: Negative.  Negative for back pain and myalgias.  Skin: Negative.  Negative for rash.  Neurological: Positive for dizziness and weakness. Negative for focal weakness and headaches.  Psychiatric/Behavioral: The patient has  insomnia. The patient is not nervous/anxious.     As per HPI. Otherwise, a complete review of systems is negative.  PAST MEDICAL HISTORY: Past Medical History:  Diagnosis Date  . Brain tumor (benign) ([place])   . Breast cancer ([place]) 1999   Status post right breast lumpectomy and radiation  . COPD (chronic obstructive pulmonary disease) ([place])   . Depression   . History of chemotherapy 1999  . History of seizure disorder   . Hypertension   . Personal history of radiation therapy 1999  . Pneumonia     PAST SURGICAL HISTORY: Past Surgical History:  Procedure Laterality Date  . BRAIN SURGERY    . Brain tumor resection  1991  . BREAST BIOPSY Right 1999   Positive  . BREAST BIOPSY Right 11/10/2019   [place] bx 10:00 heart marker, path pending  . BREAST BIOPSY Right 11/10/2019   [place] bx axilla, hydromark 4, path pending  . BREAST BIOPSY Left 11/10/2019   [place] bx 2:00, venus marker, path pending  . PORTACATH PLACEMENT Left 11/30/2019   Procedure: INSERTION PORT-A-CATH;  Surgeon: Robert Bellow, MD;  Location: [facility] ORS;  Service: General;  Laterality: Left;  . Right breast lumpectomy    . TONSILLECTOMY    . TUBAL LIGATION      FAMILY HISTORY: Family History  Problem Relation Age of Onset  . Depression Mother   . CAD Father   . Hypertension Father   . Lung cancer Father   . Breast cancer Maternal Grandmother     ADVANCED DIRECTIVES (Y/N):  N  HEALTH MAINTENANCE: Social History   Tobacco Use  . Smoking status: Current Every Day Smoker    Packs/day: 0.50    Types: Cigarettes  . Smokeless  tobacco: Never Used  Vaping Use  . Vaping Use: Never used  Substance Use Topics  . Alcohol use: No    Alcohol/week: 0.0 standard drinks  . Drug use: No    Comment: Marijuana a year ago 2020     Colonoscopy:  PAP:  Bone density:  Lipid panel:  Allergies  Allergen Reactions  . Imitrex [Sumatriptan] Anaphylaxis  . Penicillins Rash    Did it involve swelling of the  face/tongue/throat, SOB, or low BP? Unknown Did it involve sudden or severe rash/hives, skin peeling, or any reaction on the inside of your mouth or nose? Unknown Did you need to seek medical attention at a hospital or doctor's office? Unknown When did it last happen?? If all above answers are "NO", may proceed with cephalosporin use.     Current Outpatient Medications  Medication Sig Dispense Refill  . acetaminophen (TYLENOL) 500 MG tablet Take 500-1,000 mg by mouth every 6 (six) hours as needed (for pain.).    Marland Kitchen albuterol (VENTOLIN HFA) 108 (90 Base) MCG/ACT inhaler Inhale 1-2 puffs into the lungs every 6 (six) hours as needed for wheezing or shortness of breath.    . ALPRAZolam (XANAX) 0.5 MG tablet Take 2 tablets (1 mg total) by mouth at bedtime as needed for anxiety. 60 tablet 0  . amLODipine (NORVASC) 10 MG tablet Take 10 mg by mouth daily.     Marland Kitchen amoxicillin-clavulanate (AUGMENTIN) 875-125 MG tablet Take 1 tablet by mouth 2 (two) times daily. 20 tablet 0  . HYDROcodone-acetaminophen (NORCO) 5-325 MG tablet Take 1 tablet by mouth every 6 (six) hours as needed for moderate pain. 30 tablet 0  . lidocaine-prilocaine (EMLA) cream Apply to affected area once 30 g 3  . Melatonin 5 MG TABS Take 1 tablet by mouth at bedtime and may repeat dose one time if needed.    . ondansetron (ZOFRAN) 8 MG tablet TAKE 1 TABLET BY MOUTH 2 TIMES DAILY AS NEEDED 60 tablet 2  . potassium chloride SA (KLOR-CON) 20 MEQ tablet Take 1 tablet (20 mEq total) by mouth 2 (two) times daily. 10 tablet 0  . predniSONE (STERAPRED UNI-PAK 21 TAB) 10 MG (21) TBPK tablet Take as directed 21 tablet 0  . prochlorperazine (COMPAZINE) 10 MG tablet TAKE 1 TABLET BY MOUTH EVERY 6 HOURS AS NEEDED (NAUSEA OR VOMITING) 60 tablet 2  . Pseudoeph-Doxylamine-DM-APAP (NYQUIL PO) Take 1-2 capsules by mouth at bedtime as needed (sleep.).     Marland Kitchen tetrahydrozoline 0.05 % ophthalmic solution Place 1-2 drops into both eyes 3 (three) times daily as  needed (dry/irritated eyes).     No current facility-administered medications for this visit.   Facility-Administered Medications Ordered in Other Visits  Medication Dose Route Frequency Provider Last Rate Last Admin  . sodium chloride flush (NS) 0.9 % injection 10 mL  10 mL Intravenous PRN Lloyd Huger, MD      . sodium chloride flush (NS) 0.9 % injection 10 mL  10 mL Intravenous PRN Lloyd Huger, MD   10 mL at 01/13/20 0851    OBJECTIVE: Vitals:   02/09/20 1329  BP: 124/86  Pulse: (!) 105  Temp: 98 F (36.7 C)  SpO2: 98%     Body mass index is 25.23 kg/m.    ECOG FS:0 - Asymptomatic  General: Thin, no acute distress. Eyes: Pink conjunctiva, anicteric sclera. HEENT: Normocephalic, moist mucous membranes. Lungs: No audible wheezing or coughing. Heart: Regular rate and rhythm. Abdomen: Soft, nontender, no obvious distention. Musculoskeletal:  No edema, cyanosis, or clubbing. Neuro: Alert, answering all questions appropriately. Cranial nerves grossly intact. Skin: No rashes or petechiae noted. Psych: Normal affect.   LAB RESULTS:  Lab Results  Component Value Date   NA 136 02/10/2020   K 4.0 02/10/2020   CL 102 02/10/2020   CO2 24 02/10/2020   GLUCOSE 101 (H) 02/10/2020   BUN 12 02/10/2020   CREATININE 0.77 02/10/2020   CALCIUM 9.2 02/10/2020   PROT 7.3 02/10/2020   ALBUMIN 4.2 02/10/2020   AST 21 02/10/2020   ALT 17 02/10/2020   ALKPHOS 88 02/10/2020   BILITOT 0.6 02/10/2020   GFRNONAA >60 02/10/2020   GFRAA >60 02/10/2020    Lab Results  Component Value Date   WBC 8.8 02/10/2020   NEUTROABS 6.6 02/10/2020   HGB 12.3 02/10/2020   HCT 36.1 02/10/2020   MCV 91.9 02/10/2020   PLT 235 02/10/2020     STUDIES: No results found.  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  PLAN:    1. Stage IIIa ER/PR positive, HER-2 negative invasive carcinoma of the upper outer  quadrant right breast: Imaging and pathology reviewed independently.  Case also discussed with surgery.  Given the size and stage of patient's tumor, she will benefit from neoadjuvant chemotherapy with Adriamycin, Cytoxan and Udenyca support followed by weekly Taxol.  MUGA scan from November 26, 2019 revealed an EF of approximately 61%. CT scan of the chest, abdomen, and pelvis on December 01, 2019 reviewed independently with no obvious evidence of metastatic disease.  Patient has now had port placement.  Patient has now completed Adriamycin and Cytoxan.  Proceed with cycle 2 of 12 of weekly Taxol today.  Return to clinic in 1 week for further evaluation and consideration of cycle 3. 2.  History of DCIS: Status post lumpectomy and XRT.  Patient reports she did not take any adjuvant hormonal therapy.  Given this history, have referred patient to genetic counseling for evaluation and testing. 3.  Anxiety/insomnia: Continue 0.5 mg Xanax as needed.  Patient was instructed that she can increase to 1 mg nightly if necessary. 4.  Neutropenia: Resolved. 5.  Dizziness: Patient will receive an additional 1 L of IV fluids today. 6.  Hypokalemia: Resolved.  Continue oral supplementation. 7.  Tooth abscess: Resolved.  Patient will require an appointment with dentistry or oral surgery at the conclusion of her treatments. 8.  Back pain: Patient was instructed to use ibuprofen sparingly. 9.  Myalgias/restless leg: Patient does not complain of this today. 10.  Palpitations/syncope: Patient was given a referral to cardiology for further evaluation.  Patient expressed understanding and was in agreement with this plan. She also understands that She can call clinic at any time with any questions, concerns, or complaints.   Cancer Staging Primary cancer of upper outer quadrant of right female breast [facility]) Staging form: Breast, AJCC 8th Edition - Clinical stage from 11/26/2019: Stage IIIA (cT2, cN2a, cM0, G3, ER+, PR+, HER2-) -  Signed by Lloyd Huger, MD on 11/26/2019   Lloyd Huger, MD   02/11/2020 8:37 AM

## 2020-02-09 ENCOUNTER — Encounter: Payer: Self-pay | Admitting: Oncology

## 2020-02-09 NOTE — Progress Notes (Signed)
States she was having some nausea on Thursday and nothing helped. On Friday patient reports heart palpitations and dizziness then passed out 2 times. Patient states she hit her head and suffered a knot. Patient would like to discuss if could treatment be affecting her heart and has concerns. She reports feeling very weak, having no appetite, and feeling very nervous.

## 2020-02-10 ENCOUNTER — Inpatient Hospital Stay: Payer: Medicaid Other

## 2020-02-10 ENCOUNTER — Other Ambulatory Visit: Payer: Self-pay

## 2020-02-10 ENCOUNTER — Inpatient Hospital Stay (HOSPITAL_BASED_OUTPATIENT_CLINIC_OR_DEPARTMENT_OTHER): Payer: Medicaid Other | Admitting: Oncology

## 2020-02-10 VITALS — BP 124/86 | HR 105 | Temp 98.0°F | Wt 161.1 lb

## 2020-02-10 VITALS — BP 131/89 | HR 112 | Resp 20

## 2020-02-10 DIAGNOSIS — C50411 Malignant neoplasm of upper-outer quadrant of right female breast: Secondary | ICD-10-CM

## 2020-02-10 DIAGNOSIS — R42 Dizziness and giddiness: Secondary | ICD-10-CM | POA: Diagnosis not present

## 2020-02-10 DIAGNOSIS — R002 Palpitations: Secondary | ICD-10-CM | POA: Diagnosis not present

## 2020-02-10 DIAGNOSIS — R55 Syncope and collapse: Secondary | ICD-10-CM | POA: Diagnosis not present

## 2020-02-10 DIAGNOSIS — Z5111 Encounter for antineoplastic chemotherapy: Secondary | ICD-10-CM | POA: Diagnosis not present

## 2020-02-10 LAB — COMPREHENSIVE METABOLIC PANEL
ALT: 17 U/L (ref 0–44)
AST: 21 U/L (ref 15–41)
Albumin: 4.2 g/dL (ref 3.5–5.0)
Alkaline Phosphatase: 88 U/L (ref 38–126)
Anion gap: 10 (ref 5–15)
BUN: 12 mg/dL (ref 6–20)
CO2: 24 mmol/L (ref 22–32)
Calcium: 9.2 mg/dL (ref 8.9–10.3)
Chloride: 102 mmol/L (ref 98–111)
Creatinine, Ser: 0.77 mg/dL (ref 0.44–1.00)
GFR calc Af Amer: >60 mL/min (ref >60)
GFR calc non Af Amer: >60 mL/min (ref >60)
Glucose, Bld: 101 mg/dL — ABNORMAL HIGH (ref 70–99)
Potassium: 4 mmol/L (ref 3.5–5.1)
Sodium: 136 mmol/L (ref 135–145)
Total Bilirubin: 0.6 mg/dL (ref 0.3–1.2)
Total Protein: 7.3 g/dL (ref 6.5–8.1)

## 2020-02-10 LAB — CBC WITH DIFFERENTIAL/PLATELET
Abs Immature Granulocytes: 0.08 10*3/uL — ABNORMAL HIGH (ref 0.00–0.07)
Basophils Absolute: 0.1 10*3/uL (ref 0.0–0.1)
Basophils Relative: 1 %
Eosinophils Absolute: 0.1 10*3/uL (ref 0.0–0.5)
Eosinophils Relative: 1 %
HCT: 36.1 % (ref 36.0–46.0)
Hemoglobin: 12.3 g/dL (ref 12.0–15.0)
Immature Granulocytes: 1 %
Lymphocytes Relative: 15 %
Lymphs Abs: 1.3 10*3/uL (ref 0.7–4.0)
MCH: 31.3 pg (ref 26.0–34.0)
MCHC: 34.1 g/dL (ref 30.0–36.0)
MCV: 91.9 fL (ref 80.0–100.0)
Monocytes Absolute: 0.6 10*3/uL (ref 0.1–1.0)
Monocytes Relative: 7 %
Neutro Abs: 6.6 10*3/uL (ref 1.7–7.7)
Neutrophils Relative %: 75 %
Platelets: 235 10*3/uL (ref 150–400)
RBC: 3.93 MIL/uL (ref 3.87–5.11)
RDW: 15.5 % (ref 11.5–15.5)
WBC: 8.8 10*3/uL (ref 4.0–10.5)
nRBC: 0 % (ref 0.0–0.2)

## 2020-02-10 MED ORDER — SODIUM CHLORIDE 0.9 % IV SOLN
Freq: Once | INTRAVENOUS | Status: AC
  Filled 2020-02-10: qty 250

## 2020-02-10 MED ORDER — HEPARIN SOD (PORK) LOCK FLUSH 100 UNIT/ML IV SOLN
INTRAVENOUS | Status: AC
  Filled 2020-02-10: qty 5

## 2020-02-10 MED ORDER — SODIUM CHLORIDE 0.9 % IV SOLN
10.0000 mg | Freq: Once | INTRAVENOUS | Status: AC
  Administered 2020-02-10: 10 mg via INTRAVENOUS
  Filled 2020-02-10: qty 10

## 2020-02-10 MED ORDER — HEPARIN SOD (PORK) LOCK FLUSH 100 UNIT/ML IV SOLN
500.0000 [IU] | Freq: Once | INTRAVENOUS | Status: DC | PRN
  Filled 2020-02-10: qty 5

## 2020-02-10 MED ORDER — FAMOTIDINE IN NACL 20-0.9 MG/50ML-% IV SOLN
20.0000 mg | Freq: Once | INTRAVENOUS | Status: AC
  Administered 2020-02-10: 20 mg via INTRAVENOUS
  Filled 2020-02-10: qty 50

## 2020-02-10 MED ORDER — SODIUM CHLORIDE 0.9 % IV SOLN
80.0000 mg/m2 | Freq: Once | INTRAVENOUS | Status: AC
  Administered 2020-02-10: 150 mg via INTRAVENOUS
  Filled 2020-02-10: qty 25

## 2020-02-10 MED ORDER — SODIUM CHLORIDE 0.9% FLUSH
10.0000 mL | Freq: Once | INTRAVENOUS | Status: AC
  Administered 2020-02-10: 10 mL via INTRAVENOUS
  Filled 2020-02-10: qty 10

## 2020-02-10 MED ORDER — SODIUM CHLORIDE 0.9% FLUSH
10.0000 mL | INTRAVENOUS | Status: DC | PRN
  Filled 2020-02-10: qty 10

## 2020-02-10 MED ORDER — HEPARIN SOD (PORK) LOCK FLUSH 100 UNIT/ML IV SOLN
500.0000 [IU] | Freq: Once | INTRAVENOUS | Status: AC
  Administered 2020-02-10: 500 [IU] via INTRAVENOUS
  Filled 2020-02-10: qty 5

## 2020-02-10 MED ORDER — DIPHENHYDRAMINE HCL 50 MG/ML IJ SOLN
25.0000 mg | Freq: Once | INTRAMUSCULAR | Status: AC
  Administered 2020-02-10: 25 mg via INTRAVENOUS
  Filled 2020-02-10: qty 1

## 2020-02-10 NOTE — Progress Notes (Signed)
HR 99 sitting, 112 standing.  Ok to proceed per MD, will give 1L fluid today.

## 2020-02-10 NOTE — Research (Signed)
DCP-001 Consent Visit:  Research nurse met with the patient today after her appointment with Dr. Grayland Ormond in the infusion room prior to her next Taxol treatment. Consent / Hipaa for DCP-001 version 02-24-2019 was reviewed with the patient in detail at this time. All procedures, potential benefits, potential risks, side effects and alternatives were explained to the patient with her voiced satisfaction. The patient states she has no questions. She is aware that her participation is voluntary, she will not be paid for participation and she can withdraw consent at any time if she so chooses. A copy of the signed ICF / Hipaa was provided to the patient for her records. All protocol questions were reviewed with the patient for the protocol. The patient will be registered to the protocol today and questionnaire completed. Jeral Fruit, RN, BSN, OCN 02/10/2020 0940 am

## 2020-02-12 ENCOUNTER — Ambulatory Visit: Payer: Medicaid Other | Admitting: Cardiology

## 2020-02-15 NOTE — Progress Notes (Signed)
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  INTERVAL HISTORY: Patient returns to clinic today for further evaluation and consideration of cycle 3 of weekly Taxol.  She had increased nausea 1 day after her last treatment as well as some mild dizziness, but otherwise has felt well.  She does not report any increased palpitations or syncope this past week.  She continues to have chronic weakness and fatigue.  She has no other neurologic complaints.  She denies any recent fevers or illnesses.  Her appetite has improved and she denies weight loss.  She has no chest pain, shortness of breath, cough, or hemoptysis.  She denies any vomiting, constipation, or diarrhea.  She has no urinary complaints.  Patient offers no further specific complaints today.  REVIEW OF SYSTEMS:   Review of Systems  Constitutional: Positive for malaise/fatigue. Negative for fever and weight loss.  HENT: Negative for sore throat.   Respiratory: Negative.  Negative for cough, hemoptysis and shortness of breath.   Cardiovascular: Negative for chest pain, palpitations and leg swelling.  Gastrointestinal: Positive for nausea. Negative for abdominal pain.  Genitourinary: Negative.  Negative for dysuria.  Musculoskeletal: Negative.  Negative for back pain and myalgias.  Skin: Negative.  Negative for rash.  Neurological: Positive for dizziness and weakness. Negative for focal weakness and headaches.  Psychiatric/Behavioral:  The patient has insomnia. The patient is not nervous/anxious.     As per HPI. Otherwise, a complete review of systems is negative.  PAST MEDICAL HISTORY: Past Medical History:  Diagnosis Date  . Brain tumor (benign) ([place])   . Breast cancer ([place]) 1999   Status post right breast lumpectomy and radiation  . COPD (chronic obstructive pulmonary disease) ([place])   . Depression   . History of chemotherapy 1999  . History of seizure disorder   . Hypertension   . Personal history of radiation therapy 1999  . Pneumonia     PAST SURGICAL HISTORY: Past Surgical History:  Procedure Laterality Date  . BRAIN SURGERY    . Brain tumor resection  1991  . BREAST BIOPSY Right 1999   Positive  . BREAST BIOPSY Right 11/10/2019   [place] bx 10:00 heart marker, path pending  . BREAST BIOPSY Right 11/10/2019   [place] bx axilla, hydromark 4, path pending  . BREAST BIOPSY Left 11/10/2019   [place] bx 2:00, venus marker, path pending  . PORTACATH PLACEMENT Left 11/30/2019   Procedure: INSERTION PORT-A-CATH;  Surgeon: Robert Bellow, MD;  Location: [facility] ORS;  Service: General;  Laterality: Left;  . Right breast lumpectomy    . TONSILLECTOMY    . TUBAL LIGATION      FAMILY HISTORY: Family History  Problem Relation Age of Onset  . Depression Mother   . CAD Father   . Hypertension Father   . Lung cancer Father   . Breast cancer Maternal Grandmother     ADVANCED DIRECTIVES (Y/N):  N  HEALTH MAINTENANCE: Social History   Tobacco Use  . Smoking status: Current Every Day Smoker    Packs/day: 0.50  Types: Cigarettes  . Smokeless tobacco: Never Used  Vaping Use  . Vaping Use: Never used  Substance Use Topics  . Alcohol use: No    Alcohol/week: 0.0 standard drinks  . Drug use: No    Comment: Marijuana a year ago 2020     Colonoscopy:  PAP:  Bone density:  Lipid panel:  Allergies  Allergen Reactions  . Imitrex [Sumatriptan] Anaphylaxis  . Penicillins Rash    Did it involve swelling of the  face/tongue/throat, SOB, or low BP? Unknown Did it involve sudden or severe rash/hives, skin peeling, or any reaction on the inside of your mouth or nose? Unknown Did you need to seek medical attention at a hospital or doctor's office? Unknown When did it last happen?? If all above answers are "NO", may proceed with cephalosporin use.     Current Outpatient Medications  Medication Sig Dispense Refill  . acetaminophen (TYLENOL) 500 MG tablet Take 500-1,000 mg by mouth every 6 (six) hours as needed (for pain.).    Marland Kitchen albuterol (VENTOLIN HFA) 108 (90 Base) MCG/ACT inhaler Inhale 1-2 puffs into the lungs every 6 (six) hours as needed for wheezing or shortness of breath.    Marland Kitchen amLODipine (NORVASC) 10 MG tablet Take 10 mg by mouth daily.     Marland Kitchen HYDROcodone-acetaminophen (NORCO) 5-325 MG tablet Take 1 tablet by mouth every 6 (six) hours as needed for moderate pain. 30 tablet 0  . lidocaine-prilocaine (EMLA) cream Apply to affected area once 30 g 3  . Melatonin 5 MG TABS Take 1 tablet by mouth at bedtime and may repeat dose one time if needed.    . ondansetron (ZOFRAN) 8 MG tablet TAKE 1 TABLET BY MOUTH 2 TIMES DAILY AS NEEDED 60 tablet 2  . potassium chloride SA (KLOR-CON) 20 MEQ tablet Take 1 tablet (20 mEq total) by mouth 2 (two) times daily. 10 tablet 0  . predniSONE (STERAPRED UNI-PAK 21 TAB) 10 MG (21) TBPK tablet Take as directed 21 tablet 0  . prochlorperazine (COMPAZINE) 10 MG tablet TAKE 1 TABLET BY MOUTH EVERY 6 HOURS AS NEEDED (NAUSEA OR VOMITING) 60 tablet 2  . Pseudoeph-Doxylamine-DM-APAP (NYQUIL PO) Take 1-2 capsules by mouth at bedtime as needed (sleep.).     Marland Kitchen tetrahydrozoline 0.05 % ophthalmic solution Place 1-2 drops into both eyes 3 (three) times daily as needed (dry/irritated eyes).    . ALPRAZolam (XANAX) 0.5 MG tablet Take 2 tablets (1 mg total) by mouth at bedtime as needed for anxiety. 60 tablet 0   No current facility-administered medications for this visit.    Facility-Administered Medications Ordered in Other Visits  Medication Dose Route Frequency Provider Last Rate Last Admin  . famotidine (PEPCID) IVPB 20 mg premix  20 mg Intravenous Once Lloyd Huger, MD 100 mL/hr at 02/17/20 1025 20 mg at 02/17/20 1025  . heparin lock flush 100 unit/mL  500 Units Intracatheter Once PRN Lloyd Huger, MD      . PACLitaxel (TAXOL) 150 mg in sodium chloride 0.9 % 250 mL chemo infusion (</= '80mg'$ /m2)  80 mg/m2 (Treatment Plan Recorded) Intravenous Once Lloyd Huger, MD      . sodium chloride flush (NS) 0.9 % injection 10 mL  10 mL Intravenous PRN Lloyd Huger, MD      . sodium chloride flush (NS) 0.9 % injection 10 mL  10 mL Intravenous PRN Lloyd Huger, MD   10 mL at 01/13/20 0851    OBJECTIVE: Vitals:   02/17/20 2423  BP: (S) (!) 89/64  Pulse: 87  Resp: 18  Temp: 98 F (36.7 C)  SpO2: 100%     Body mass index is 25.14 kg/m.    ECOG FS:0 - Asymptomatic  General: Thin, no acute distress. Eyes: Pink conjunctiva, anicteric sclera. HEENT: Normocephalic, moist mucous membranes. Lungs: No audible wheezing or coughing. Heart: Regular rate and rhythm. Abdomen: Soft, nontender, no obvious distention. Musculoskeletal: No edema, cyanosis, or clubbing. Neuro: Alert, answering all questions appropriately. Cranial nerves grossly intact. Skin: No rashes or petechiae noted. Psych: Normal affect.  LAB RESULTS:  Lab Results  Component Value Date   NA 134 (L) 02/17/2020   K 4.1 02/17/2020   CL 101 02/17/2020   CO2 24 02/17/2020   GLUCOSE 95 02/17/2020   BUN 10 02/17/2020   CREATININE 0.74 02/17/2020   CALCIUM 9.1 02/17/2020   PROT 7.4 02/17/2020   ALBUMIN 4.1 02/17/2020   AST 24 02/17/2020   ALT 21 02/17/2020   ALKPHOS 90 02/17/2020   BILITOT 0.6 02/17/2020   GFRNONAA >60 02/17/2020   GFRAA >60 02/17/2020    Lab Results  Component Value Date   WBC 6.2 02/17/2020   NEUTROABS 4.4 02/17/2020   HGB 12.2 02/17/2020    HCT 35.6 (L) 02/17/2020   MCV 93.9 02/17/2020   PLT 259 02/17/2020     STUDIES: No results found.  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  PLAN:    1. 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  Case also discussed with surgery.  Given the size and stage of patient's tumor, she will benefit from neoadjuvant chemotherapy with Adriamycin, Cytoxan and Udenyca support followed by weekly Taxol.  MUGA scan from November 26, 2019 revealed an EF of approximately 61%. CT scan of the chest, abdomen, and pelvis on December 01, 2019 reviewed independently with no obvious evidence of metastatic disease.  Patient has now had port placement.  Patient has now completed Adriamycin and Cytoxan.  Proceed with cycle 3 of 12 of weekly Taxol today.  Return to clinic in 1 week for further evaluation and consideration of cycle 4.  2.  History of DCIS: Status post lumpectomy and XRT.  Patient reports she did not take any adjuvant hormonal therapy.  Given this history, have referred patient to genetic counseling for evaluation and testing. 3.  Anxiety/insomnia: Continue 1 mg Xanax nightly as needed.  Patient was given a refill today. 4.  Neutropenia: Resolved. 5.  Dizziness: Patient will receive 1 L of IV fluids today and will plan on giving fluids with each Taxol infusion. 6.  Hypokalemia: Resolved.  Continue oral supplementation. 7.  Tooth abscess: Resolved.  Patient will require an appointment with dentistry or oral surgery at the conclusion of her treatments. 8.  Back pain: Patient was instructed to use ibuprofen sparingly. 9.  Myalgias/restless leg: Patient does not complain of this today. 10.  Palpitations/syncope: Patient has an appointment with cardiology next week.  Patient expressed understanding and was in agreement with this plan.  She also understands that She can call clinic at any time with any questions, concerns, or complaints.   Cancer Staging Primary cancer of upper outer quadrant of right female breast [facility]) Staging form: Breast, AJCC 8th Edition - Clinical stage from 11/26/2019: Stage IIIA (cT2, cN2a, cM0, G3, ER+, PR+, HER2-) - Signed by Lloyd Huger, MD on 11/26/2019  Lloyd Huger, MD   02/17/2020 10:52 AM

## 2020-02-16 ENCOUNTER — Encounter: Payer: Self-pay | Admitting: Oncology

## 2020-02-16 NOTE — Progress Notes (Signed)
Patient called for pre screening. States she has heachache rates pain at 4. Reports she is having trouble with constipation. Has been taking miralax but states it is not working. Would like to discuss with provider. Also would like to discuss possible different medication to treat nausea. States current meds are not helping. Requesting refill of alprazolam. Denies other concerns at this time.

## 2020-02-17 ENCOUNTER — Other Ambulatory Visit: Payer: Self-pay | Admitting: Oncology

## 2020-02-17 ENCOUNTER — Inpatient Hospital Stay: Payer: Medicaid Other

## 2020-02-17 ENCOUNTER — Other Ambulatory Visit: Payer: Self-pay

## 2020-02-17 ENCOUNTER — Inpatient Hospital Stay (HOSPITAL_BASED_OUTPATIENT_CLINIC_OR_DEPARTMENT_OTHER): Payer: Medicaid Other | Admitting: Oncology

## 2020-02-17 VITALS — BP 89/64 | HR 87 | Temp 98.0°F | Resp 18 | Wt 160.5 lb

## 2020-02-17 VITALS — BP 105/70 | HR 75

## 2020-02-17 DIAGNOSIS — C50411 Malignant neoplasm of upper-outer quadrant of right female breast: Secondary | ICD-10-CM

## 2020-02-17 DIAGNOSIS — Z5111 Encounter for antineoplastic chemotherapy: Secondary | ICD-10-CM | POA: Diagnosis not present

## 2020-02-17 LAB — COMPREHENSIVE METABOLIC PANEL
ALT: 21 U/L (ref 0–44)
AST: 24 U/L (ref 15–41)
Albumin: 4.1 g/dL (ref 3.5–5.0)
Alkaline Phosphatase: 90 U/L (ref 38–126)
Anion gap: 9 (ref 5–15)
BUN: 10 mg/dL (ref 6–20)
CO2: 24 mmol/L (ref 22–32)
Calcium: 9.1 mg/dL (ref 8.9–10.3)
Chloride: 101 mmol/L (ref 98–111)
Creatinine, Ser: 0.74 mg/dL (ref 0.44–1.00)
GFR calc Af Amer: >60 mL/min (ref >60)
GFR calc non Af Amer: >60 mL/min (ref >60)
Glucose, Bld: 95 mg/dL (ref 70–99)
Potassium: 4.1 mmol/L (ref 3.5–5.1)
Sodium: 134 mmol/L — ABNORMAL LOW (ref 135–145)
Total Bilirubin: 0.6 mg/dL (ref 0.3–1.2)
Total Protein: 7.4 g/dL (ref 6.5–8.1)

## 2020-02-17 LAB — CBC WITH DIFFERENTIAL/PLATELET
Abs Immature Granulocytes: 0.04 10*3/uL (ref 0.00–0.07)
Basophils Absolute: 0.1 10*3/uL (ref 0.0–0.1)
Basophils Relative: 1 %
Eosinophils Absolute: 0.1 10*3/uL (ref 0.0–0.5)
Eosinophils Relative: 2 %
HCT: 35.6 % — ABNORMAL LOW (ref 36.0–46.0)
Hemoglobin: 12.2 g/dL (ref 12.0–15.0)
Immature Granulocytes: 1 %
Lymphocytes Relative: 18 %
Lymphs Abs: 1.1 10*3/uL (ref 0.7–4.0)
MCH: 32.2 pg (ref 26.0–34.0)
MCHC: 34.3 g/dL (ref 30.0–36.0)
MCV: 93.9 fL (ref 80.0–100.0)
Monocytes Absolute: 0.5 10*3/uL (ref 0.1–1.0)
Monocytes Relative: 8 %
Neutro Abs: 4.4 10*3/uL (ref 1.7–7.7)
Neutrophils Relative %: 70 %
Platelets: 259 10*3/uL (ref 150–400)
RBC: 3.79 MIL/uL — ABNORMAL LOW (ref 3.87–5.11)
RDW: 16.4 % — ABNORMAL HIGH (ref 11.5–15.5)
WBC: 6.2 10*3/uL (ref 4.0–10.5)
nRBC: 0 % (ref 0.0–0.2)

## 2020-02-17 MED ORDER — SODIUM CHLORIDE 0.9 % IV SOLN
80.0000 mg/m2 | Freq: Once | INTRAVENOUS | Status: AC
  Administered 2020-02-17: 150 mg via INTRAVENOUS
  Filled 2020-02-17: qty 25

## 2020-02-17 MED ORDER — FAMOTIDINE IN NACL 20-0.9 MG/50ML-% IV SOLN
20.0000 mg | Freq: Once | INTRAVENOUS | Status: AC
  Administered 2020-02-17: 20 mg via INTRAVENOUS
  Filled 2020-02-17: qty 50

## 2020-02-17 MED ORDER — DIPHENHYDRAMINE HCL 50 MG/ML IJ SOLN
25.0000 mg | Freq: Once | INTRAMUSCULAR | Status: AC
  Administered 2020-02-17: 25 mg via INTRAVENOUS
  Filled 2020-02-17: qty 1

## 2020-02-17 MED ORDER — HEPARIN SOD (PORK) LOCK FLUSH 100 UNIT/ML IV SOLN
500.0000 [IU] | Freq: Once | INTRAVENOUS | Status: AC | PRN
  Administered 2020-02-17: 500 [IU]
  Filled 2020-02-17: qty 5

## 2020-02-17 MED ORDER — SODIUM CHLORIDE 0.9 % IV SOLN
10.0000 mg | Freq: Once | INTRAVENOUS | Status: AC
  Administered 2020-02-17: 10 mg via INTRAVENOUS
  Filled 2020-02-17: qty 10

## 2020-02-17 MED ORDER — SODIUM CHLORIDE 0.9 % IV SOLN
Freq: Once | INTRAVENOUS | Status: AC
  Filled 2020-02-17: qty 250

## 2020-02-17 MED ORDER — ALPRAZOLAM 0.5 MG PO TABS: 1.0000 mg | ORAL_TABLET | Freq: Every evening | ORAL | 0 refills | Status: DC | PRN

## 2020-02-17 NOTE — Progress Notes (Signed)
1000: Per Dr. Grayland Ormond okay to proceed with Taxol treatment with b/p 89/64. Pt to receive 1 Liter NS over one hour. **see MAR**.   1210: Pt stable at discharge.

## 2020-02-18 NOTE — Progress Notes (Signed)
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  INTERVAL HISTORY: Patient returns to clinic today for further evaluation and consideration of cycle 4 of weekly Taxol.  She continues to have chronic weakness and fatigue.  She also has persistent depression.  She does not complain of any further dizziness or heart palpitations and was evaluated by cardiology earlier this week.  Patient has a heart monitor in place.  She has no neurologic complaints.  She denies any recent fevers or illnesses.  Her appetite has improved and she denies weight loss.  She has no chest pain, shortness of breath, cough, or hemoptysis.  She denies any vomiting, constipation, or diarrhea.  She has no urinary complaints.  Patient offers no further specific complaints today.  REVIEW OF SYSTEMS:   Review of Systems  Constitutional: Positive for malaise/fatigue. Negative for fever and weight loss.  HENT: Negative for sore throat.   Respiratory: Negative.  Negative for cough, hemoptysis and shortness of breath.   Cardiovascular: Negative for chest pain, palpitations and leg swelling.  Gastrointestinal: Positive for nausea. Negative for abdominal pain.  Genitourinary: Negative.  Negative for dysuria.  Musculoskeletal: Negative.  Negative for back pain and myalgias.  Skin: Negative.  Negative for rash.  Neurological: Positive for weakness. Negative for dizziness, focal weakness and headaches.  Psychiatric/Behavioral:  Positive for depression. The patient has insomnia. The patient is not nervous/anxious.     As per HPI. Otherwise, a complete review of systems is negative.  PAST MEDICAL HISTORY: Past Medical History:  Diagnosis Date  . Brain tumor (benign) ([place])   . Breast cancer ([place]) 1999   Status post right breast lumpectomy and radiation  . COPD (chronic obstructive pulmonary disease) ([place])   . Depression   . History of chemotherapy 1999  . History of seizure disorder   . Hypertension   . Personal history of radiation therapy 1999  . Pneumonia     PAST SURGICAL HISTORY: Past Surgical History:  Procedure Laterality Date  . BRAIN SURGERY    . Brain tumor resection  1991  . BREAST BIOPSY Right 1999   Positive  . BREAST BIOPSY Right 11/10/2019   [place] bx 10:00 heart marker, path pending  . BREAST BIOPSY Right 11/10/2019   [place] bx axilla, hydromark 4, path pending  . BREAST BIOPSY Left 11/10/2019   [place] bx 2:00, venus marker, path pending  . PORTACATH PLACEMENT Left 11/30/2019   Procedure: INSERTION PORT-A-CATH;  Surgeon: Robert Bellow, MD;  Location: [facility] ORS;  Service: General;  Laterality: Left;  . Right breast lumpectomy    . TONSILLECTOMY    . TUBAL LIGATION      FAMILY HISTORY: Family History  Problem Relation Age of Onset  . Depression Mother   . CAD Father   . Hypertension Father   . Lung cancer Father   . Coronary artery disease Father 74  . Breast cancer Maternal Grandmother     ADVANCED DIRECTIVES (Y/N):  N  HEALTH MAINTENANCE: Social History   Tobacco Use  . Smoking status: Current Every Day  Smoker    Packs/day: 0.50    Years: 40.00    Pack years: 20.00    Types: Cigarettes  . Smokeless tobacco: Never Used  Vaping Use  . Vaping Use: Never used  Substance Use Topics  . Alcohol use: No    Alcohol/week: 0.0 standard drinks  . Drug use: Yes    Frequency: 3.0 times per week    Types: Marijuana     Colonoscopy:  PAP:  Bone density:  Lipid  panel:  Allergies  Allergen Reactions  . Imitrex [Sumatriptan] Anaphylaxis  . Penicillins Rash    Did it involve swelling of the face/tongue/throat, SOB, or low BP? Unknown Did it involve sudden or severe rash/hives, skin peeling, or any reaction on the inside of your mouth or nose? Unknown Did you need to seek medical attention at a hospital or doctor's office? Unknown When did it last happen?? If all above answers are "NO", may proceed with cephalosporin use.     Current Outpatient Medications  Medication Sig Dispense Refill  . acetaminophen (TYLENOL) 500 MG tablet Take 500-1,000 mg by mouth every 6 (six) hours as needed (for pain.).    Marland Kitchen albuterol (VENTOLIN HFA) 108 (90 Base) MCG/ACT inhaler Inhale 1-2 puffs into the lungs every 6 (six) hours as needed for wheezing or shortness of breath.    . ALPRAZolam (XANAX) 0.5 MG tablet Take 2 tablets (1 mg total) by mouth at bedtime as needed for anxiety. 60 tablet 0  . DULoxetine (CYMBALTA) 20 MG capsule Take 2 capsules (40 mg total) by mouth daily. 30 capsule 2  . lidocaine-prilocaine (EMLA) cream Apply to affected area once 30 g 3  . Melatonin 5 MG TABS Take 1 tablet by mouth at bedtime and may repeat dose one time if needed.    . ondansetron (ZOFRAN) 8 MG tablet TAKE 1 TABLET BY MOUTH 2 TIMES DAILY AS NEEDED 60 tablet 2  . potassium chloride SA (KLOR-CON) 20 MEQ tablet Take 1 tablet (20 mEq total) by mouth 2 (two) times daily. 10 tablet 0  . prochlorperazine (COMPAZINE) 10 MG tablet TAKE 1 TABLET BY MOUTH EVERY 6 HOURS AS NEEDED (NAUSEA OR VOMITING) 60 tablet 2  . Pseudoeph-Doxylamine-DM-APAP (NYQUIL PO) Take 1-2 capsules by mouth at bedtime as needed (sleep.).     Marland Kitchen tetrahydrozoline 0.05 % ophthalmic solution Place 1-2 drops into both eyes 3 (three) times daily as needed (dry/irritated eyes).     No current facility-administered medications for this visit.   Facility-Administered Medications Ordered in Other Visits  Medication Dose  Route Frequency Provider Last Rate Last Admin  . sodium chloride flush (NS) 0.9 % injection 10 mL  10 mL Intravenous PRN Lloyd Huger, MD      . sodium chloride flush (NS) 0.9 % injection 10 mL  10 mL Intravenous PRN Lloyd Huger, MD   10 mL at 01/13/20 0851    OBJECTIVE: Vitals:   02/24/20 0852  BP: 139/90  Pulse: 100  Temp: 98.2 F (36.8 C)  SpO2: 99%     Body mass index is 25.78 kg/m.    ECOG FS:0 - Asymptomatic  General: Thin, no acute distress. Eyes: Pink conjunctiva, anicteric sclera. HEENT: Normocephalic, moist mucous membranes. Lungs: No audible wheezing or coughing. Heart: Regular rate and rhythm. Abdomen: Soft, nontender, no obvious distention. Musculoskeletal: No edema, cyanosis, or clubbing. Neuro: Alert, answering all questions appropriately. Cranial nerves grossly intact. Skin: No rashes or petechiae noted. Psych: Normal affect.  LAB RESULTS:  Lab Results  Component Value Date   NA 135 02/24/2020   K 4.1 02/24/2020   CL 104 02/24/2020   CO2 22 02/24/2020   GLUCOSE 106 (H) 02/24/2020   BUN 15 02/24/2020   CREATININE 0.92 02/24/2020   CALCIUM 9.0 02/24/2020   PROT 7.5 02/24/2020   ALBUMIN 4.2 02/24/2020   AST 25 02/24/2020   ALT 23 02/24/2020   ALKPHOS 95 02/24/2020   BILITOT 0.5 02/24/2020   GFRNONAA >60 02/24/2020   GFRAA >60 02/24/2020    Lab Results  Component Value Date   WBC 8.2 02/24/2020   NEUTROABS 6.6 02/24/2020   HGB 12.6 02/24/2020   HCT 36.5 02/24/2020   MCV 94.8 02/24/2020   PLT 253 02/24/2020     STUDIES: No results found.  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  PLAN:    1. 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  Case also discussed with surgery.  Given the size and stage of patient's tumor, she will benefit from neoadjuvant  chemotherapy with Adriamycin, Cytoxan and Udenyca support followed by weekly Taxol.  MUGA scan from November 26, 2019 revealed an EF of approximately 61%. CT scan of the chest, abdomen, and pelvis on December 01, 2019 reviewed independently with no obvious evidence of metastatic disease.  Patient completed 4 cycles of Adriamycin and Cytoxan.  Proceed with cycle 4 of 12 of weekly Taxol today.  Return to clinic in 1 week for further evaluation and consideration of cycle 5.  2.  History of DCIS: Status post lumpectomy and XRT.  Patient reports she did not take any adjuvant hormonal therapy.  Given this history, have referred patient to genetic counseling for evaluation and testing. 3.  Anxiety/insomnia: Continue 1 mg Xanax nightly as needed.  4.  Neutropenia: Resolved. 5.  Hypokalemia: Resolved.  Continue oral supplementation. 6.  Tooth abscess: Resolved.  Patient will require an appointment with dentistry or oral surgery at the conclusion of her treatments. 7.  Back pain: Patient was instructed to use ibuprofen sparingly. 8.  Myalgias/restless leg: Patient does not complain of this today. 9.  Palpitations/syncope: Appreciate cardiology input.  Unlikely cardiac in nature, but patient has a heart monitor in place for the next 2 weeks. 10.  Depression: Patient was given a prescription for Cymbalta.  Patient expressed understanding and was in agreement with this plan. She also understands that She can call clinic at any time with any questions, concerns, or complaints.   Cancer Staging Primary cancer of upper outer quadrant of right female breast [facility]) Staging form: Breast, AJCC 8th Edition - Clinical stage from 11/26/2019: Stage IIIA (cT2, cN2a, cM0, G3, ER+, PR+, HER2-) - Signed by Lloyd Huger, MD on 11/26/2019   Lloyd Huger, MD   02/24/2020 5:00 PM

## 2020-02-22 ENCOUNTER — Encounter: Payer: Self-pay | Admitting: Internal Medicine

## 2020-02-22 ENCOUNTER — Ambulatory Visit (INDEPENDENT_AMBULATORY_CARE_PROVIDER_SITE_OTHER): Payer: Medicaid Other | Admitting: Internal Medicine

## 2020-02-22 ENCOUNTER — Ambulatory Visit (INDEPENDENT_AMBULATORY_CARE_PROVIDER_SITE_OTHER): Payer: Medicaid Other

## 2020-02-22 ENCOUNTER — Other Ambulatory Visit: Payer: Self-pay

## 2020-02-22 VITALS — BP 127/87 | HR 94 | Ht 67.0 in | Wt 165.2 lb

## 2020-02-22 DIAGNOSIS — R55 Syncope and collapse: Secondary | ICD-10-CM | POA: Diagnosis not present

## 2020-02-22 DIAGNOSIS — R002 Palpitations: Secondary | ICD-10-CM

## 2020-02-22 DIAGNOSIS — I10 Essential (primary) hypertension: Secondary | ICD-10-CM

## 2020-02-22 NOTE — Patient Instructions (Signed)
Medication Instructions:  Your physician has recommended you make the following change in your medication:  1- STOP Amlodipine.   *If you need a refill on your cardiac medications before your next appointment, please call your pharmacy*   Lab Work: none If you have labs (blood work) drawn today and your tests are completely normal, you will receive your results only by: Marland Kitchen MyChart Message (if you have MyChart) OR . A paper copy in the mail If you have any lab test that is abnormal or we need to change your treatment, we will call you to review the results.   Testing/Procedures: 1-   Your physician has requested that you have an echocardiogram. Echocardiography is a painless test that uses sound waves to create images of your heart. It provides your doctor with information about the size and shape of your heart and how well your heart's chambers and valves are working. This procedure takes approximately one hour. There are no restrictions for this procedure. You may get an IV, if needed, to receive an ultrasound enhancing agent through to better visualize your heart.    2-  ZIO MONITOR FOR 14 DAYS Your physician has recommended that you wear a Zio monitor. This monitor is a medical device that records the heart's electrical activity. Doctors most often use these monitors to diagnose arrhythmias. Arrhythmias are problems with the speed or rhythm of the heartbeat. The monitor is a small device applied to your chest. You can wear one while you do your normal daily activities. While wearing this monitor if you have any symptoms to push the button and record what you felt. Once you have worn this monitor for the period of time provider prescribed (Usually 14 days), you will return the monitor device in the postage paid box. Once it is returned they will download the data collected and provide [place] with a report which the provider will then review and we will call you with those results. Important  tips:  1. Avoid showering during the first 24 hours of wearing the monitor. 2. Avoid excessive sweating to help maximize wear time. 3. Do not submerge the device, no hot tubs, and no swimming pools. 4. Keep any lotions or oils away from the patch. 5. After 24 hours you may shower with the patch on. Take brief showers with your back facing the shower head.  6. Do not remove patch once it has been placed because that will interrupt data and decrease adhesive wear time. 7. Push the button when you have any symptoms and write down what you were feeling. 8. Once you have completed wearing your monitor, remove and place into box which has postage paid and place in your outgoing mailbox.  9. If for some reason you have misplaced your box then call our office and we can provide another box and/or mail it off for you.    Follow-Up: At [facility], you and your health needs are our priority.  As part of our continuing mission to provide you with exceptional heart care, we have created designated Provider Care Teams.  These Care Teams include your primary Cardiologist (physician) and Advanced Practice Providers (APPs -  Physician Assistants and Nurse Practitioners) who all work together to provide you with the care you need, when you need it.  We recommend signing up for the patient portal called "MyChart".  Sign up information is provided on this After Visit Summary.  MyChart is used to connect with patients for Virtual Visits (Telemedicine).  Patients are able to view lab/test results, encounter notes, upcoming appointments, etc.  Non-urgent messages can be sent to your provider as well.   To learn more about what you can do with MyChart, go to [email or web address].    Your next appointment:   6 week(s)  The format for your next appointment:   In Person  Provider:    You may see DR Harrell Gave END or one of the following Advanced Practice Providers on your designated Care Team:     Murray Hodgkins, NP  Christell Faith, PA-C  Marrianne Mood, PA-C    Echocardiogram An echocardiogram is a procedure that uses painless sound waves (ultrasound) to produce an image of the heart. Images from an echocardiogram can provide important information about:  Signs of coronary artery disease (CAD).  Aneurysm detection. An aneurysm is a weak or damaged part of an artery wall that bulges out from the normal force of blood pumping through the body.  Heart size and shape. Changes in the size or shape of the heart can be associated with certain conditions, including heart failure, aneurysm, and CAD.  Heart muscle function.  Heart valve function.  Signs of a past heart attack.  Fluid buildup around the heart.  Thickening of the heart muscle.  A tumor or infectious growth around the heart valves. Tell a health care provider about:  Any allergies you have.  All medicines you are taking, including vitamins, herbs, eye drops, creams, and over-the-counter medicines.  Any blood disorders you have.  Any surgeries you have had.  Any medical conditions you have.  Whether you are pregnant or may be pregnant. What are the risks? Generally, this is a safe procedure. However, problems may occur, including:  Allergic reaction to dye (contrast) that may be used during the procedure. What happens before the procedure? No specific preparation is needed. You may eat and drink normally. What happens during the procedure?   An IV tube may be inserted into one of your veins.  You may receive contrast through this tube. A contrast is an injection that improves the quality of the pictures from your heart.  A gel will be applied to your chest.  A wand-like tool (transducer) will be moved over your chest. The gel will help to transmit the sound waves from the transducer.  The sound waves will harmlessly bounce off of your heart to allow the heart images to be captured in real-time  motion. The images will be recorded on a computer. The procedure may vary among health care providers and hospitals. What happens after the procedure?  You may return to your normal, everyday life, including diet, activities, and medicines, unless your health care provider tells you not to do that. Summary  An echocardiogram is a procedure that uses painless sound waves (ultrasound) to produce an image of the heart.  Images from an echocardiogram can provide important information about the size and shape of your heart, heart muscle function, heart valve function, and fluid buildup around your heart.  You do not need to do anything to prepare before this procedure. You may eat and drink normally.  After the echocardiogram is completed, you may return to your normal, everyday life, unless your health care provider tells you not to do that. This information is not intended to replace advice given to you by your health care provider. Make sure you discuss any questions you have with your health care provider. Document Revised: 12/11/2018 Document Reviewed: 09/22/2016 Elsevier Patient Education  [employer].

## 2020-02-22 NOTE — Progress Notes (Signed)
New Outpatient Visit Date: 02/22/2020  Referring Provider: Lloyd Huger, MD [address] [place] [place],  [place] [id number]  Chief Complaint: Syncope  HPI:  Kelsey Russo is a 54 y.o. female who is being seen today for the evaluation of syncope at the request of Dr. Grayland Ormond. She has a history of right breast cancer and is currently undergoing chemotherapy, as well as hypertension, COPD, seizure disorder, and depression.  Ms. Parmar reports 2 episodes of fainting following her most recent round of chemotherapy.  She had been nauseated the day before, though she has been trying to eat and drink as much as possible.  Since starting chemotherapy, her blood pressure has trended down.  Despite this, she has been taking amlodipine with the exception of the last 2 days, having run out.  During both episodes of fainting, she was walking and began to feel lightheadedness before briefly blacking out.  She endorses associated palpitations and notes that it feels like her heart races quite a bit.  Ms. Suminski has not had any chest pain or shortness of breath.  She notes occasional dependent ankle edema when she is on her feet a lot.  She reports having undergone an echocardiogram about 6 years ago through her family practitioner to obtain a "baseline."  Ms. Roediger reports having had seizures a few decades ago following removal of a benign brain tumor.  Her more recent fainting spells are distinctly different than what she experienced with her seizures.  --------------------------------------------------------------------------------------------------  Cardiovascular History & Procedures: Cardiovascular Problems:  Syncope and palpitations  Risk Factors:  Hypertension and tobacco use  Cath/PCI:  None  CV Surgery:  None  EP Procedures and Devices:  None  Non-Invasive Evaluation(s):  MUGA (11/26/2019): LVEF 61% with normal wall motion.  Recent CV Pertinent Labs: Lab Results  Component  Value Date   K 4.1 02/17/2020   BUN 10 02/17/2020   CREATININE 0.74 02/17/2020    --------------------------------------------------------------------------------------------------  Past Medical History:  Diagnosis Date  . Brain tumor (benign) ([place])   . Breast cancer ([place]) 1999   Status post right breast lumpectomy and radiation  . COPD (chronic obstructive pulmonary disease) ([place])   . Depression   . History of chemotherapy 1999  . History of seizure disorder   . Hypertension   . Personal history of radiation therapy 1999  . Pneumonia     Past Surgical History:  Procedure Laterality Date  . BRAIN SURGERY    . Brain tumor resection  1991  . BREAST BIOPSY Right 1999   Positive  . BREAST BIOPSY Right 11/10/2019   [place] bx 10:00 heart marker, path pending  . BREAST BIOPSY Right 11/10/2019   [place] bx axilla, hydromark 4, path pending  . BREAST BIOPSY Left 11/10/2019   [place] bx 2:00, venus marker, path pending  . PORTACATH PLACEMENT Left 11/30/2019   Procedure: INSERTION PORT-A-CATH;  Surgeon: Robert Bellow, MD;  Location: [facility] ORS;  Service: General;  Laterality: Left;  . Right breast lumpectomy    . TONSILLECTOMY    . TUBAL LIGATION      Current Meds  Medication Sig  . acetaminophen (TYLENOL) 500 MG tablet Take 500-1,000 mg by mouth every 6 (six) hours as needed (for pain.).  Marland Kitchen albuterol (VENTOLIN HFA) 108 (90 Base) MCG/ACT inhaler Inhale 1-2 puffs into the lungs every 6 (six) hours as needed for wheezing or shortness of breath.  . ALPRAZolam (XANAX) 0.5 MG tablet Take 2 tablets (1 mg total) by mouth at bedtime  as needed for anxiety.  Marland Kitchen amLODipine (NORVASC) 10 MG tablet Take 10 mg by mouth daily.   Marland Kitchen lidocaine-prilocaine (EMLA) cream Apply to affected area once  . Melatonin 5 MG TABS Take 1 tablet by mouth at bedtime and may repeat dose one time if needed.  . ondansetron (ZOFRAN) 8 MG tablet TAKE 1 TABLET BY MOUTH 2 TIMES DAILY AS NEEDED  . potassium chloride SA (KLOR-CON) 20  MEQ tablet Take 1 tablet (20 mEq total) by mouth 2 (two) times daily.  . prochlorperazine (COMPAZINE) 10 MG tablet TAKE 1 TABLET BY MOUTH EVERY 6 HOURS AS NEEDED (NAUSEA OR VOMITING)  . Pseudoeph-Doxylamine-DM-APAP (NYQUIL PO) Take 1-2 capsules by mouth at bedtime as needed (sleep.).   Marland Kitchen tetrahydrozoline 0.05 % ophthalmic solution Place 1-2 drops into both eyes 3 (three) times daily as needed (dry/irritated eyes).    Allergies: Imitrex [sumatriptan] and Penicillins  Social History   Tobacco Use  . Smoking status: Current Every Day Smoker    Packs/day: 0.50    Types: Cigarettes  . Smokeless tobacco: Never Used  Vaping Use  . Vaping Use: Never used  Substance Use Topics  . Alcohol use: No    Alcohol/week: 0.0 standard drinks  . Drug use: No    Comment: Marijuana a year ago 2020    Family History  Problem Relation Age of Onset  . Depression Mother   . CAD Father   . Hypertension Father   . Lung cancer Father   . Breast cancer Maternal Grandmother     Review of Systems: A 12-system review of systems was performed and was negative except as noted in the HPI.  --------------------------------------------------------------------------------------------------  Physical Exam: Pulse 94   Ht 5\' 7"  (1.702 m)   Wt 165 lb 4 oz (75 kg)   SpO2 95%   BMI 25.88 kg/m   Position Blood pressure (mmHg) Heart rate (bpm)  Lying  119/82  92  Sitting  112/85  99  Standing  124/90  108  Standing (3 minutes)  122/89  103    General: NAD. HEENT: No conjunctival pallor or scleral icterus. Facemask in place. Neck: Supple without lymphadenopathy, thyromegaly, JVD, or HJR. No carotid bruit. Lungs: Normal work of breathing. Clear to auscultation bilaterally without wheezes or crackles. Heart: Regular rate and rhythm without murmurs, rubs, or gallops. Non-displaced PMI. Abd: Bowel sounds present. Soft, NT/ND without hepatosplenomegaly Ext: No lower extremity edema. Radial, PT, and DP pulses  are 2+ bilaterally Skin: Warm and dry without rash. Neuro: CNIII-XII intact. Strength and fine-touch sensation intact in upper and lower extremities bilaterally. Psych: Normal mood and affect.  EKG: Normal sinus rhythm without abnormality.  Lab Results  Component Value Date   WBC 6.2 02/17/2020   HGB 12.2 02/17/2020   HCT 35.6 (L) 02/17/2020   MCV 93.9 02/17/2020   PLT 259 02/17/2020    Lab Results  Component Value Date   NA 134 (L) 02/17/2020   K 4.1 02/17/2020   CL 101 02/17/2020   CO2 24 02/17/2020   BUN 10 02/17/2020   CREATININE 0.74 02/17/2020   GLUCOSE 95 02/17/2020   ALT 21 02/17/2020   Lab Results  Component Value Date   TSH 1.706 07/13/2015    --------------------------------------------------------------------------------------------------  ASSESSMENT AND PLAN: Syncope and palpitations: I suspect that recent syncopal episodes were due to orthostatic hypotension in the setting of nausea vomiting and decreased oral intake following chemotherapy.  Ongoing amlodipine use likely exacerbated this.  MUGA in March was notable for  normal LVEF.  Examination today is unremarkable other than borderline positive rise in heart rate from lying to standing.  I have recommended that we discontinue amlodipine and for Ms. Hage to try to increase her fluid intake.  We will obtain a transthoracic echocardiogram and 14-day event monitor for further evaluation.  Pending completion of our work-up, I have advised Ms. Lamson to remain refrain from driving or operating heavy machinery.  Hypertension: Blood pressure recently has been running normal to low, likely related to chemotherapy effects and decreased oral intake.  As above, we will hold amlodipine.  Follow-up: Return to clinic in 6 weeks.  [name], MD 02/22/2020 4:30 PM

## 2020-02-23 ENCOUNTER — Encounter: Payer: Self-pay | Admitting: Internal Medicine

## 2020-02-23 ENCOUNTER — Encounter: Payer: Self-pay | Admitting: Oncology

## 2020-02-23 ENCOUNTER — Telehealth: Payer: Self-pay | Admitting: Internal Medicine

## 2020-02-23 DIAGNOSIS — R55 Syncope and collapse: Secondary | ICD-10-CM | POA: Insufficient documentation

## 2020-02-23 DIAGNOSIS — I10 Essential (primary) hypertension: Secondary | ICD-10-CM | POA: Insufficient documentation

## 2020-02-23 DIAGNOSIS — R002 Palpitations: Secondary | ICD-10-CM | POA: Insufficient documentation

## 2020-02-23 NOTE — Progress Notes (Signed)
Patient prescreened for appointment. Patient reports she has been having right flank pain for the last couple of days and isn't sure if it's muscular or if it's her kidney. Went to the cardiologist yesterday and is wearing a zio monitor.

## 2020-02-23 NOTE — Telephone Encounter (Signed)
Attempted to schedule. No ans no vm   Needs echo in 2 weeks and 6 wk fu visit app/ end from checkout 02/23/20

## 2020-02-24 ENCOUNTER — Other Ambulatory Visit: Payer: Self-pay

## 2020-02-24 ENCOUNTER — Inpatient Hospital Stay: Payer: Medicaid Other

## 2020-02-24 ENCOUNTER — Inpatient Hospital Stay (HOSPITAL_BASED_OUTPATIENT_CLINIC_OR_DEPARTMENT_OTHER): Payer: Medicaid Other | Admitting: Oncology

## 2020-02-24 VITALS — BP 139/90 | HR 100 | Temp 98.2°F | Wt 164.6 lb

## 2020-02-24 VITALS — BP 138/94 | HR 93

## 2020-02-24 DIAGNOSIS — C50411 Malignant neoplasm of upper-outer quadrant of right female breast: Secondary | ICD-10-CM | POA: Diagnosis not present

## 2020-02-24 DIAGNOSIS — Z5111 Encounter for antineoplastic chemotherapy: Secondary | ICD-10-CM | POA: Diagnosis not present

## 2020-02-24 LAB — COMPREHENSIVE METABOLIC PANEL
ALT: 23 U/L (ref 0–44)
AST: 25 U/L (ref 15–41)
Albumin: 4.2 g/dL (ref 3.5–5.0)
Alkaline Phosphatase: 95 U/L (ref 38–126)
Anion gap: 9 (ref 5–15)
BUN: 15 mg/dL (ref 6–20)
CO2: 22 mmol/L (ref 22–32)
Calcium: 9 mg/dL (ref 8.9–10.3)
Chloride: 104 mmol/L (ref 98–111)
Creatinine, Ser: 0.92 mg/dL (ref 0.44–1.00)
GFR calc Af Amer: >60 mL/min (ref >60)
GFR calc non Af Amer: >60 mL/min (ref >60)
Glucose, Bld: 106 mg/dL — ABNORMAL HIGH (ref 70–99)
Potassium: 4.1 mmol/L (ref 3.5–5.1)
Sodium: 135 mmol/L (ref 135–145)
Total Bilirubin: 0.5 mg/dL (ref 0.3–1.2)
Total Protein: 7.5 g/dL (ref 6.5–8.1)

## 2020-02-24 LAB — CBC WITH DIFFERENTIAL/PLATELET
Abs Immature Granulocytes: 0.07 10*3/uL (ref 0.00–0.07)
Basophils Absolute: 0 10*3/uL (ref 0.0–0.1)
Basophils Relative: 1 %
Eosinophils Absolute: 0.1 10*3/uL (ref 0.0–0.5)
Eosinophils Relative: 2 %
HCT: 36.5 % (ref 36.0–46.0)
Hemoglobin: 12.6 g/dL (ref 12.0–15.0)
Immature Granulocytes: 1 %
Lymphocytes Relative: 11 %
Lymphs Abs: 0.9 10*3/uL (ref 0.7–4.0)
MCH: 32.7 pg (ref 26.0–34.0)
MCHC: 34.5 g/dL (ref 30.0–36.0)
MCV: 94.8 fL (ref 80.0–100.0)
Monocytes Absolute: 0.4 10*3/uL (ref 0.1–1.0)
Monocytes Relative: 5 %
Neutro Abs: 6.6 10*3/uL (ref 1.7–7.7)
Neutrophils Relative %: 80 %
Platelets: 253 10*3/uL (ref 150–400)
RBC: 3.85 MIL/uL — ABNORMAL LOW (ref 3.87–5.11)
RDW: 15.9 % — ABNORMAL HIGH (ref 11.5–15.5)
WBC: 8.2 10*3/uL (ref 4.0–10.5)
nRBC: 0 % (ref 0.0–0.2)

## 2020-02-24 MED ORDER — HEPARIN SOD (PORK) LOCK FLUSH 100 UNIT/ML IV SOLN
500.0000 [IU] | Freq: Once | INTRAVENOUS | Status: AC | PRN
  Administered 2020-02-24: 500 [IU]
  Filled 2020-02-24: qty 5

## 2020-02-24 MED ORDER — SODIUM CHLORIDE 0.9 % IV SOLN
Freq: Once | INTRAVENOUS | Status: AC
  Filled 2020-02-24: qty 250

## 2020-02-24 MED ORDER — FAMOTIDINE IN NACL 20-0.9 MG/50ML-% IV SOLN
20.0000 mg | Freq: Once | INTRAVENOUS | Status: AC
  Administered 2020-02-24: 20 mg via INTRAVENOUS
  Filled 2020-02-24: qty 50

## 2020-02-24 MED ORDER — HEPARIN SOD (PORK) LOCK FLUSH 100 UNIT/ML IV SOLN
INTRAVENOUS | Status: AC
  Filled 2020-02-24: qty 5

## 2020-02-24 MED ORDER — DIPHENHYDRAMINE HCL 50 MG/ML IJ SOLN
25.0000 mg | Freq: Once | INTRAMUSCULAR | Status: AC
  Administered 2020-02-24: 25 mg via INTRAVENOUS
  Filled 2020-02-24: qty 1

## 2020-02-24 MED ORDER — SODIUM CHLORIDE 0.9 % IV SOLN
80.0000 mg/m2 | Freq: Once | INTRAVENOUS | Status: AC
  Administered 2020-02-24: 150 mg via INTRAVENOUS
  Filled 2020-02-24: qty 25

## 2020-02-24 MED ORDER — DULOXETINE HCL 20 MG PO CPEP: 40.0000 mg | ORAL_CAPSULE | Freq: Every day | ORAL | 2 refills | Status: DC

## 2020-02-24 MED ORDER — SODIUM CHLORIDE 0.9 % IV SOLN
10.0000 mg | Freq: Once | INTRAVENOUS | Status: AC
  Administered 2020-02-24: 10 mg via INTRAVENOUS
  Filled 2020-02-24: qty 10

## 2020-02-25 ENCOUNTER — Telehealth: Payer: Self-pay | Admitting: Internal Medicine

## 2020-02-25 NOTE — Progress Notes (Signed)
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  INTERVAL HISTORY: Patient returns to clinic today for further evaluation and consideration of cycle 5 of weekly Taxol.  She continues to have insomnia as well as depressive symptoms.  She has chronic weakness and fatigue.  She also has persistent back pain.  She does not complain of any further dizziness or heart palpitations and was evaluated by cardiology.  Patient has a heart monitor in place.  She has no neurologic complaints.  She denies any recent fevers or illnesses.  Her appetite has improved and she denies weight loss.  She has no chest pain, shortness of breath, cough, or hemoptysis.  She denies any nausea, vomiting, constipation, or diarrhea.  She has no urinary complaints.  Patient offers no further specific complaints today.  REVIEW OF SYSTEMS:   Review of Systems  Constitutional: Positive for malaise/fatigue. Negative for fever and weight loss.  HENT: Negative for sore throat.   Respiratory: Negative.  Negative for cough, hemoptysis and shortness of breath.   Cardiovascular: Negative for chest pain, palpitations and leg swelling.  Gastrointestinal: Negative.  Negative for abdominal pain and nausea.  Genitourinary: Negative.  Negative for dysuria.  Musculoskeletal: Positive for back pain. Negative for myalgias.  Skin: Negative.  Negative for rash.  Neurological: Positive for weakness. Negative for dizziness, focal weakness and  headaches.  Psychiatric/Behavioral: Positive for depression. The patient has insomnia. The patient is not nervous/anxious.     As per HPI. Otherwise, a complete review of systems is negative.  PAST MEDICAL HISTORY: Past Medical History:  Diagnosis Date  . Brain tumor (benign) ([place])   . Breast cancer ([place]) 1999   Status post right breast lumpectomy and radiation  . COPD (chronic obstructive pulmonary disease) ([place])   . Depression   . History of chemotherapy 1999  . History of seizure disorder   . Hypertension   . Personal history of radiation therapy 1999  . Pneumonia     PAST SURGICAL HISTORY: Past Surgical History:  Procedure Laterality Date  . BRAIN SURGERY    . Brain tumor resection  1991  . BREAST BIOPSY Right 1999   Positive  . BREAST BIOPSY Right 11/10/2019   [place] bx 10:00 heart marker, path pending  . BREAST BIOPSY Right 11/10/2019   [place] bx axilla, hydromark 4, path pending  . BREAST BIOPSY Left 11/10/2019   [place] bx 2:00, venus marker, path pending  . PORTACATH PLACEMENT Left 11/30/2019   Procedure: INSERTION PORT-A-CATH;  Surgeon: Robert Bellow, MD;  Location: [facility] ORS;  Service: General;  Laterality: Left;  . Right breast lumpectomy    . TONSILLECTOMY    . TUBAL LIGATION      FAMILY HISTORY: Family History  Problem Relation Age of Onset  . Depression Mother   . CAD Father   . Hypertension Father   . Lung cancer Father   . Coronary artery disease Father 70  . Breast cancer Maternal Grandmother     ADVANCED DIRECTIVES (Y/N):  N  HEALTH MAINTENANCE: Social History   Tobacco  Use  . Smoking status: Current Every Day Smoker    Packs/day: 0.50    Years: 40.00    Pack years: 20.00    Types: Cigarettes  . Smokeless tobacco: Never Used  Vaping Use  . Vaping Use: Never used  Substance Use Topics  . Alcohol use: No    Alcohol/week: 0.0 standard drinks  . Drug use: Yes    Frequency: 3.0 times per week    Types: Marijuana      Colonoscopy:  PAP:  Bone density:  Lipid panel:  Allergies  Allergen Reactions  . Imitrex [Sumatriptan] Anaphylaxis  . Penicillins Rash    Did it involve swelling of the face/tongue/throat, SOB, or low BP? Unknown Did it involve sudden or severe rash/hives, skin peeling, or any reaction on the inside of your mouth or nose? Unknown Did you need to seek medical attention at a hospital or doctor's office? Unknown When did it last happen?? If all above answers are "NO", may proceed with cephalosporin use.     Current Outpatient Medications  Medication Sig Dispense Refill  . acetaminophen (TYLENOL) 500 MG tablet Take 500-1,000 mg by mouth every 6 (six) hours as needed (for pain.).    Marland Kitchen albuterol (VENTOLIN HFA) 108 (90 Base) MCG/ACT inhaler Inhale 1-2 puffs into the lungs every 6 (six) hours as needed for wheezing or shortness of breath.    . ALPRAZolam (XANAX) 0.5 MG tablet Take 2 tablets (1 mg total) by mouth at bedtime as needed for anxiety. 60 tablet 0  . amLODipine (NORVASC) 5 MG tablet Take 1 tablet (5 mg total) by mouth daily. 30 tablet 6  . DULoxetine (CYMBALTA) 20 MG capsule Take 2 capsules (40 mg total) by mouth daily. 30 capsule 2  . lidocaine-prilocaine (EMLA) cream Apply to affected area once 30 g 3  . Melatonin 5 MG TABS Take 1 tablet by mouth at bedtime and may repeat dose one time if needed.    . ondansetron (ZOFRAN) 8 MG tablet TAKE 1 TABLET BY MOUTH 2 TIMES DAILY AS NEEDED 60 tablet 2  . potassium chloride SA (KLOR-CON) 20 MEQ tablet Take 1 tablet (20 mEq total) by mouth 2 (two) times daily. 10 tablet 0  . prochlorperazine (COMPAZINE) 10 MG tablet TAKE 1 TABLET BY MOUTH EVERY 6 HOURS AS NEEDED (NAUSEA OR VOMITING) 60 tablet 2  . Pseudoeph-Doxylamine-DM-APAP (NYQUIL PO) Take 1-2 capsules by mouth at bedtime as needed (sleep.).     Marland Kitchen tetrahydrozoline 0.05 % ophthalmic solution Place 1-2 drops into both eyes 3 (three) times daily as needed (dry/irritated eyes).     No  current facility-administered medications for this visit.   Facility-Administered Medications Ordered in Other Visits  Medication Dose Route Frequency Provider Last Rate Last Admin  . sodium chloride flush (NS) 0.9 % injection 10 mL  10 mL Intravenous PRN Lloyd Huger, MD      . sodium chloride flush (NS) 0.9 % injection 10 mL  10 mL Intravenous PRN Lloyd Huger, MD   10 mL at 01/13/20 0851    OBJECTIVE: Vitals:   03/02/20 0921  BP: (!) 139/95  Pulse: (!) 103  Temp: 99 F (37.2 C)  SpO2: 99%     Body mass index is 25.12 kg/m.    ECOG FS:0 - Asymptomatic  General: Thin, no acute distress. Eyes: Pink conjunctiva, anicteric sclera. HEENT: Normocephalic, moist mucous membranes. Lungs: No audible wheezing or coughing. Heart: Regular rate and rhythm. Abdomen: Soft, nontender, no obvious distention. Musculoskeletal: No  edema, cyanosis, or clubbing. Neuro: Alert, answering all questions appropriately. Cranial nerves grossly intact. Skin: No rashes or petechiae noted. Psych: Normal affect.  LAB RESULTS:  Lab Results  Component Value Date   NA 132 (L) 03/02/2020   K 3.6 03/02/2020   CL 100 03/02/2020   CO2 22 03/02/2020   GLUCOSE 102 (H) 03/02/2020   BUN 9 03/02/2020   CREATININE 0.79 03/02/2020   CALCIUM 9.0 03/02/2020   PROT 7.7 03/02/2020   ALBUMIN 4.2 03/02/2020   AST 28 03/02/2020   ALT 25 03/02/2020   ALKPHOS 97 03/02/2020   BILITOT 0.4 03/02/2020   GFRNONAA >60 03/02/2020   GFRAA >60 03/02/2020    Lab Results  Component Value Date   WBC 7.6 03/02/2020   NEUTROABS 5.8 03/02/2020   HGB 13.0 03/02/2020   HCT 37.8 03/02/2020   MCV 95.5 03/02/2020   PLT 283 03/02/2020     STUDIES: ECHOCARDIOGRAM COMPLETE  Result Date: 03/01/2020    ECHOCARDIOGRAM REPORT   Patient Name:   DEZIRE TURK Date of Exam: 03/01/2020 Medical Rec #:  [id number]        Height:       67.0 in Accession #:    [id number]       Weight:       164.6 lb Date of Birth:   [birth year]       BSA:          1.862 m Patient Age:    58 years         BP:           132/88 mmHg Patient Gender: F                HR:           92 bpm. Exam Location:  [place] Procedure: 2D Echo, Cardiac Doppler and Color Doppler Indications:    R55 Syncope  History:        Patient has no prior history of Echocardiogram examinations.                 COPD; Risk Factors:Current Smoker, Hypertension and                 Chemotherapy.  Sonographer:    Pilar Jarvis RDMS, RVT, RDCS Referring Phys: [id number] CHRISTOPHER END  Sonographer Comments: Suboptimal parasternal window. IMPRESSIONS  1. Left ventricular ejection fraction, by estimation, is 60 to 65%. The left ventricle has normal function. The left ventricle has no regional wall motion abnormalities. There is mild left ventricular hypertrophy. Left ventricular diastolic parameters are consistent with Grade I diastolic dysfunction (impaired relaxation). Elevated left atrial pressure.  2. Right ventricular systolic function is normal. The right ventricular size is normal.  3. The mitral valve is grossly normal. Trivial mitral valve regurgitation. No evidence of mitral stenosis.  4. The aortic valve was not well visualized. Aortic valve regurgitation is not visualized. No aortic stenosis is present.  5. Pulmonic valve regurgitation not well assessed.  6. The inferior vena cava is normal in size with greater than 50% respiratory variability, suggesting right atrial pressure of 3 mmHg. FINDINGS  Left Ventricle: Left ventricular ejection fraction, by estimation, is 60 to 65%. The left ventricle has normal function. The left ventricle has no regional wall motion abnormalities. The left ventricular internal cavity size was normal in size. There is  mild left ventricular hypertrophy. Left ventricular diastolic parameters are consistent with Grade I diastolic dysfunction (impaired relaxation). Elevated left atrial pressure. Right  Ventricle: The right ventricular size is normal.  Right vetricular wall thickness was not assessed. Right ventricular systolic function is normal. Left Atrium: Left atrial size was normal in size. Right Atrium: Right atrial size was normal in size. Pericardium: There is no evidence of pericardial effusion. Mitral Valve: The mitral valve is grossly normal. Trivial mitral valve regurgitation. No evidence of mitral valve stenosis. Tricuspid Valve: The tricuspid valve is grossly normal. Tricuspid valve regurgitation is trivial. Aortic Valve: The aortic valve was not well visualized. Aortic valve regurgitation is not visualized. No aortic stenosis is present. Aortic valve mean gradient measures 3.0 mmHg. Aortic valve peak gradient measures 5.6 mmHg. Aortic valve area, by VTI measures 4.37 cm. Pulmonic Valve: The pulmonic valve was not well visualized. Pulmonic valve regurgitation not well assessed. Aorta: The aortic root and ascending aorta are structurally normal, with no evidence of dilitation. Pulmonary Artery: The pulmonary artery is not well seen. Venous: The inferior vena cava is normal in size with greater than 50% respiratory variability, suggesting right atrial pressure of 3 mmHg. IAS/Shunts: The interatrial septum was not well visualized.  LEFT VENTRICLE PLAX 2D LVIDd:         3.62 cm     Diastology LVIDs:         2.75 cm     LV e' lateral:   8.81 cm/s LV PW:         1.28 cm     LV E/e' lateral: 9.3 LV IVS:        1.21 cm     LV e' medial:    5.00 cm/s LVOT diam:     2.20 cm     LV E/e' medial:  16.4 LV SV:         79 LV SV Index:   42          2D Longitudinal Strain LVOT Area:     3.80 cm    2D Strain GLS Avg:     -15.8 %  LV Volumes (MOD) LV vol d, MOD A2C: 64.4 ml LV vol d, MOD A4C: 72.2 ml LV vol s, MOD A2C: 22.8 ml LV vol s, MOD A4C: 28.4 ml LV SV MOD A2C:     41.6 ml LV SV MOD A4C:     72.2 ml LV SV MOD BP:      44.3 ml RIGHT VENTRICLE            IVC RV Basal diam:  3.20 cm    IVC diam: 1.60 cm RV S prime:     9.03 cm/s TAPSE (M-mode): 1.8 cm LEFT  ATRIUM             Index       RIGHT ATRIUM           Index LA diam:        3.00 cm 1.61 cm/m  RA Area:     11.20 cm LA Vol (A2C):   54.8 ml 29.44 ml/m RA Volume:   25.10 ml  13.48 ml/m LA Vol (A4C):   49.4 ml 26.54 ml/m LA Biplane Vol: 52.1 ml 27.99 ml/m  AORTIC VALVE                   PULMONIC VALVE AV Area (Vmax):    3.77 cm    PV Vmax:       1.11 m/s AV Area (Vmean):   3.59 cm    PV Peak grad:  4.9 mmHg AV Area (VTI):  4.37 cm AV Vmax:           118.00 cm/s AV Vmean:          81.000 cm/s AV VTI:            0.180 m AV Peak Grad:      5.6 mmHg AV Mean Grad:      3.0 mmHg LVOT Vmax:         117.00 cm/s LVOT Vmean:        76.500 cm/s LVOT VTI:          0.207 m LVOT/AV VTI ratio: 1.15  AORTA Ao Root diam: 3.30 cm Ao Asc diam:  2.90 cm Ao Arch diam: 2.8 cm MITRAL VALVE MV Area (PHT): 1.97 cm     SHUNTS MV Decel Time: 386 msec     Systemic VTI:  0.21 m MV E velocity: 82.00 cm/s   Systemic Diam: 2.20 cm MV A velocity: 139.00 cm/s MV E/A ratio:  0.59 Christopher End MD Electronically signed by [name] MD Signature Date/Time: 03/01/2020/6:45:52 PM    Final     ASSESSMENT: Stage IIIa ER/PR positive, 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  PLAN:    1. 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  Case also discussed with surgery.  Given the size and stage of patient's tumor, she will benefit from neoadjuvant chemotherapy with Adriamycin, Cytoxan and Udenyca support followed by weekly Taxol.  MUGA scan from November 26, 2019 revealed an EF of approximately 61%. CT scan of the chest, abdomen, and pelvis on December 01, 2019 reviewed independently with no obvious evidence of metastatic disease.  Patient completed 4 cycles of Adriamycin and Cytoxan.  Proceed with cycle 5 of 12 of weekly Taxol today.  Return to clinic in 1 week for further evaluation and consideration  of cycle 6. 2.  History of DCIS: Status post lumpectomy and XRT.  Patient reports she did not take any adjuvant hormonal therapy.  Given this history, have referred patient to genetic counseling for evaluation and testing. 3.  Anxiety/insomnia: Continue 1 mg Xanax nightly as needed.  4.  Neutropenia: Resolved. 5.  Hypokalemia: Resolved.  Continue oral supplementation. 6.  Tooth abscess: Resolved.  Patient will require an appointment with dentistry or oral surgery at the conclusion of her treatments. 7.  Back pain: Patient was instructed to use ibuprofen sparingly.  She was also given a prescription for tramadol today. 8.  Myalgias/restless leg: Patient does not complain of this today. 9.  Palpitations/syncope: Appreciate cardiology input.  Unlikely cardiac in nature, but patient has a heart monitor in place.  Return to cardiology as scheduled. 10.  Depression: Patient has not noticed a difference in on Cymbalta, but has been instructed to continue taking as prescribed.  Patient expressed understanding and was in agreement with this plan. She also understands that She can call clinic at any time with any questions, concerns, or complaints.   Cancer Staging Primary cancer of upper outer quadrant of right female breast [facility]) Staging form: Breast, AJCC 8th Edition - Clinical stage from 11/26/2019: Stage IIIA (cT2, cN2a, cM0, G3, ER+, PR+, HER2-) - Signed by Lloyd Huger, MD on 11/26/2019   Lloyd Huger, MD   03/03/2020 6:47 AM

## 2020-02-25 NOTE — Telephone Encounter (Signed)
Patient notified and verbalized understanding of recommendations. She will let us know if BP stays elevated.

## 2020-02-25 NOTE — Telephone Encounter (Signed)
Pt c/o medication issue:  1. Name of Medication: amlodpine   2. How are you currently taking this medication (dosage and times per day)? 10 mg, stopped taking as of Monday per Dr. Saunders Revel  3. Are you having a reaction (difficulty breathing--STAT)?   4. What is your medication issue? BP is up to 160/110 this morning

## 2020-02-25 NOTE — Telephone Encounter (Signed)
Patient calling back.  From OV 02/22/20 with Dr End: "Hypertension: Blood pressure recently has been running normal to low, likely related to chemotherapy effects and decreased oral intake.  As above, we will hold amlodipine."  Patient reports she's been pushing water over the past several days as well.  Patient has been taking BP since office visit (no other values to report) but this morning was 160/110. Had patient retake while on the phone with me and was 125/95, HR 75. Denies H/a, blurred vision, chest pain or shortness of breath. Had some lower extremity swelling yesterday afternoon but she was on her feet all day.  Advised patient to continue to monitor BP/HR around the same time each day and let us know if greater than 140/90.  Routing to Dr End for review.

## 2020-02-25 NOTE — Telephone Encounter (Signed)
No answer. Left message to call back.   

## 2020-02-25 NOTE — Telephone Encounter (Signed)
I agree with monitoring blood pressure at home and deferring reinitiation of amlodipine right now, given recent hypotension and associated syncope.  Kelsey Russo should minimize her salt intake and let us know if her blood pressure is consistently above 140/90.  [name], MD [facility]

## 2020-03-01 ENCOUNTER — Ambulatory Visit (INDEPENDENT_AMBULATORY_CARE_PROVIDER_SITE_OTHER): Payer: Medicaid Other

## 2020-03-01 ENCOUNTER — Other Ambulatory Visit: Payer: Self-pay

## 2020-03-01 ENCOUNTER — Telehealth: Payer: Self-pay | Admitting: Internal Medicine

## 2020-03-01 DIAGNOSIS — R55 Syncope and collapse: Secondary | ICD-10-CM

## 2020-03-01 DIAGNOSIS — C50411 Malignant neoplasm of upper-outer quadrant of right female breast: Secondary | ICD-10-CM

## 2020-03-01 NOTE — Telephone Encounter (Signed)
The patient saw Dr. Saunders Revel on 02/22/20 and amlodipine was stopped at that time due to syncope.  She had called back on 02/25/20 with reports of a BP at 160/110. This was retaken at that time and BP was 125/95 while on the phone with Anderson Malta, RN for. Dr. Saunders Revel.  She was instructed at that time to continue to monitor her BP and call if consistently 140/90.  The patient is calling back today with updated numbers.   To Dr. Saunders Revel to review.

## 2020-03-01 NOTE — Telephone Encounter (Signed)
Blood pressures appear somewhat labile but overall mildly elevated.  I suggest that Kelsey Russo begin taking amlodipine 5 mg daily.  [name], MD [facility]

## 2020-03-01 NOTE — Telephone Encounter (Signed)
Pt c/o BP issue: STAT if pt c/o blurred vision, one-sided weakness or slurred speech  1. What are your last 5 BP readings?  6/29 165/104 6/28 152/10 6/27 139/100 6/26 120/95 6/25 152/99  2. Are you having any other symptoms (ex. Dizziness, headache, blurred vision, passed out)? Asymptomatic, undergoing chemo at this time  3. What is your BP issue? Running higher than 140/90

## 2020-03-02 ENCOUNTER — Telehealth: Payer: Self-pay | Admitting: General Practice

## 2020-03-02 ENCOUNTER — Encounter: Payer: Self-pay | Admitting: Oncology

## 2020-03-02 ENCOUNTER — Inpatient Hospital Stay: Payer: Medicaid Other

## 2020-03-02 ENCOUNTER — Inpatient Hospital Stay (HOSPITAL_BASED_OUTPATIENT_CLINIC_OR_DEPARTMENT_OTHER): Payer: Medicaid Other | Admitting: Oncology

## 2020-03-02 VITALS — HR 96

## 2020-03-02 VITALS — BP 139/95 | HR 103 | Temp 99.0°F | Wt 160.4 lb

## 2020-03-02 DIAGNOSIS — Z006 Encounter for examination for normal comparison and control in clinical research program: Secondary | ICD-10-CM | POA: Diagnosis not present

## 2020-03-02 DIAGNOSIS — C50411 Malignant neoplasm of upper-outer quadrant of right female breast: Secondary | ICD-10-CM

## 2020-03-02 DIAGNOSIS — Z5111 Encounter for antineoplastic chemotherapy: Secondary | ICD-10-CM | POA: Diagnosis not present

## 2020-03-02 LAB — COMPREHENSIVE METABOLIC PANEL
ALT: 25 U/L (ref 0–44)
AST: 28 U/L (ref 15–41)
Albumin: 4.2 g/dL (ref 3.5–5.0)
Alkaline Phosphatase: 97 U/L (ref 38–126)
Anion gap: 10 (ref 5–15)
BUN: 9 mg/dL (ref 6–20)
CO2: 22 mmol/L (ref 22–32)
Calcium: 9 mg/dL (ref 8.9–10.3)
Chloride: 100 mmol/L (ref 98–111)
Creatinine, Ser: 0.79 mg/dL (ref 0.44–1.00)
GFR calc Af Amer: >60 mL/min (ref >60)
GFR calc non Af Amer: >60 mL/min (ref >60)
Glucose, Bld: 102 mg/dL — ABNORMAL HIGH (ref 70–99)
Potassium: 3.6 mmol/L (ref 3.5–5.1)
Sodium: 132 mmol/L — ABNORMAL LOW (ref 135–145)
Total Bilirubin: 0.4 mg/dL (ref 0.3–1.2)
Total Protein: 7.7 g/dL (ref 6.5–8.1)

## 2020-03-02 LAB — CBC WITH DIFFERENTIAL/PLATELET
Abs Immature Granulocytes: 0.03 10*3/uL (ref 0.00–0.07)
Basophils Absolute: 0 10*3/uL (ref 0.0–0.1)
Basophils Relative: 1 %
Eosinophils Absolute: 0.1 10*3/uL (ref 0.0–0.5)
Eosinophils Relative: 1 %
HCT: 37.8 % (ref 36.0–46.0)
Hemoglobin: 13 g/dL (ref 12.0–15.0)
Immature Granulocytes: 0 %
Lymphocytes Relative: 14 %
Lymphs Abs: 1.1 10*3/uL (ref 0.7–4.0)
MCH: 32.8 pg (ref 26.0–34.0)
MCHC: 34.4 g/dL (ref 30.0–36.0)
MCV: 95.5 fL (ref 80.0–100.0)
Monocytes Absolute: 0.5 10*3/uL (ref 0.1–1.0)
Monocytes Relative: 7 %
Neutro Abs: 5.8 10*3/uL (ref 1.7–7.7)
Neutrophils Relative %: 77 %
Platelets: 283 10*3/uL (ref 150–400)
RBC: 3.96 MIL/uL (ref 3.87–5.11)
RDW: 16 % — ABNORMAL HIGH (ref 11.5–15.5)
WBC: 7.6 10*3/uL (ref 4.0–10.5)
nRBC: 0 % (ref 0.0–0.2)

## 2020-03-02 MED ORDER — AMLODIPINE BESYLATE 5 MG PO TABS: 5.0000 mg | ORAL_TABLET | Freq: Every day | ORAL | 6 refills | Status: DC

## 2020-03-02 MED ORDER — SODIUM CHLORIDE 0.9 % IV SOLN
Freq: Once | INTRAVENOUS | Status: AC
  Filled 2020-03-02: qty 250

## 2020-03-02 MED ORDER — HEPARIN SOD (PORK) LOCK FLUSH 100 UNIT/ML IV SOLN
500.0000 [IU] | Freq: Once | INTRAVENOUS | Status: AC | PRN
  Administered 2020-03-02: 500 [IU]
  Filled 2020-03-02: qty 5

## 2020-03-02 MED ORDER — FAMOTIDINE IN NACL 20-0.9 MG/50ML-% IV SOLN
20.0000 mg | Freq: Once | INTRAVENOUS | Status: AC
  Administered 2020-03-02: 20 mg via INTRAVENOUS
  Filled 2020-03-02: qty 50

## 2020-03-02 MED ORDER — HEPARIN SOD (PORK) LOCK FLUSH 100 UNIT/ML IV SOLN
INTRAVENOUS | Status: AC
  Filled 2020-03-02: qty 5

## 2020-03-02 MED ORDER — SODIUM CHLORIDE 0.9 % IV SOLN
10.0000 mg | Freq: Once | INTRAVENOUS | Status: AC
  Administered 2020-03-02: 10 mg via INTRAVENOUS
  Filled 2020-03-02: qty 10

## 2020-03-02 MED ORDER — DIPHENHYDRAMINE HCL 50 MG/ML IJ SOLN
25.0000 mg | Freq: Once | INTRAMUSCULAR | Status: AC
  Administered 2020-03-02: 25 mg via INTRAVENOUS
  Filled 2020-03-02: qty 1

## 2020-03-02 MED ORDER — SODIUM CHLORIDE 0.9 % IV SOLN
80.0000 mg/m2 | Freq: Once | INTRAVENOUS | Status: AC
  Administered 2020-03-02: 150 mg via INTRAVENOUS
  Filled 2020-03-02: qty 25

## 2020-03-02 NOTE — Telephone Encounter (Signed)
I spoke with the patient.  I have advised her of Dr. Darnelle Bos recommendations to resume amlodipine but at 5 mg once daily. She is aware to continue to monitor her BP and if over the next 5 days she is still consistently > 093 systolic or <235 systolic, she will give [place] a call back.  The patient voices understanding of the above recommendations and is agreeable.   RX for amlodipine 5 mg once daily sent to [facility].

## 2020-03-02 NOTE — Telephone Encounter (Signed)
Individual has been contacted and confirmed that they have insurance. Not eligible to become a pt.

## 2020-03-02 NOTE — Research (Addendum)
SWOG D0518 Week 4 Assessment:  Patient in to clinic for her scheduled week 4 assessment unaccompanied this am. Patient states she is really fatigued today, but still able to perform ADL's and up during the day. Infusion room RN accessed port without any difficulty and drew research central labs per protocol. Specimens were taken to the lab for processing immediately with the green top tube on ice per requirement.  The patient was escorted to the waiting area for her appointment with Dr. Grayland Ormond, research questionnaires were provided to the patient for her to complete prior to her visit. Dr. Grayland Ormond in to see patient and assess solicited AE's for which the patient complains of some numbness to her toes only. States the numbness is intermittent, not constant and currently does not affect her ability to walk or perform ADL's. The neuropathy assessment was completed prior to her infusion with Yolande Jolly, RN assistance using her right arm and right leg ( dominant side ). The patient was able to detect sensation and sharpness with the neuropen monofilament and neurotip to all 10 sites of her right foot. The tuning fork was completed to her right arm and leg without any difficulty. All of the protocol required assessments were completed and the patient was assisted back to the waiting area for her scheduled infusion room appointment. Research nurse reminded the patient she will see her again for her next assessment at 8 weeks in July. The patient was encouraged to call if she has any questions or concerns before her next visit. The patient voiced understanding of all procedures explained and is in agreement with them.    EMILLIE CHASEN [id number]  03/02/2020  Adverse Event Log  Study/Protocol: Q4210 Cycle: Week 4  Event Grade Onset Date Resolved Date Drug Name Attribution Treatment Comments   Dysesthesia          Neuralgia          Paresthesia          Peripheral Motor Neuropathy           Peripheral Sensory Neuropathy  Grade 1  03/02/2020    Probable  None    Fatigue Grade 1 03/02/2020   Possible None   Jeral Fruit, RN, BSN, OCN 03/02/2020 1025 am

## 2020-03-02 NOTE — Progress Notes (Signed)
Patient reports still having trouble staying asleep. She doesn't feel cymbalta is working. She also reports some back pain, she rates pain at 5. She states she had some pain while swallowing food over the weekend. Was not sure if that was just a side effect from treatment. She is still having problems with nausea and states medication doesn't seem to help. Still experiencing some dizziness and numbness in toes.

## 2020-03-02 NOTE — Telephone Encounter (Signed)
Attempted to call the patient. No answer- I left a message to please call back.  

## 2020-03-03 MED ORDER — TRAMADOL HCL 50 MG PO TABS: 50.0000 mg | ORAL_TABLET | Freq: Two times a day (BID) | ORAL | 0 refills | Status: DC

## 2020-03-06 NOTE — Progress Notes (Signed)
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  INTERVAL HISTORY: Patient returns to clinic today for further evaluation and consideration of cycle 6 of weekly Taxol.  She continues to have insomnia and depressive symptoms that are unchanged since starting Cymbalta.  She has chronic weakness and fatigue and occasional nausea. She does not complain of any further dizziness or heart palpitations and was evaluated by cardiology. She has no neurologic complaints.  She denies any recent fevers or illnesses.  Her appetite has improved and she denies weight loss.  She has no chest pain, shortness of breath, cough, or hemoptysis.  She denies any vomiting, constipation, or diarrhea.  She has no urinary complaints.  Patient offers no further specific complaints today.  REVIEW OF SYSTEMS:   Review of Systems  Constitutional: Positive for malaise/fatigue. Negative for fever and weight loss.  HENT: Negative for sore throat.   Respiratory: Negative.  Negative for cough, hemoptysis and shortness of breath.   Cardiovascular: Negative for chest pain, palpitations and leg swelling.  Gastrointestinal: Positive for nausea. Negative for abdominal pain.  Genitourinary: Negative.  Negative for dysuria.  Musculoskeletal: Positive for back pain. Negative for myalgias.  Skin: Negative.  Negative for rash.  Neurological: Positive for weakness. Negative for dizziness, focal weakness and headaches.   Psychiatric/Behavioral: Positive for depression. The patient has insomnia. The patient is not nervous/anxious.     As per HPI. Otherwise, a complete review of systems is negative.  PAST MEDICAL HISTORY: Past Medical History:  Diagnosis Date  . Brain tumor (benign) ([place])   . Breast cancer ([place]) 1999   Status post right breast lumpectomy and radiation  . COPD (chronic obstructive pulmonary disease) ([place])   . Depression   . History of chemotherapy 1999  . History of seizure disorder   . Hypertension   . Personal history of radiation therapy 1999  . Pneumonia     PAST SURGICAL HISTORY: Past Surgical History:  Procedure Laterality Date  . BRAIN SURGERY    . Brain tumor resection  1991  . BREAST BIOPSY Right 1999   Positive  . BREAST BIOPSY Right 11/10/2019   [place] bx 10:00 heart marker, path pending  . BREAST BIOPSY Right 11/10/2019   [place] bx axilla, hydromark 4, path pending  . BREAST BIOPSY Left 11/10/2019   [place] bx 2:00, venus marker, path pending  . PORTACATH PLACEMENT Left 11/30/2019   Procedure: INSERTION PORT-A-CATH;  Surgeon: Robert Bellow, MD;  Location: [facility] ORS;  Service: General;  Laterality: Left;  . Right breast lumpectomy    . TONSILLECTOMY    . TUBAL LIGATION      FAMILY HISTORY: Family History  Problem Relation Age of Onset  . Depression Mother   . CAD Father   . Hypertension Father   . Lung cancer Father   . Coronary artery disease Father 34  . Breast cancer Maternal Grandmother     ADVANCED DIRECTIVES (Y/N):  N  HEALTH MAINTENANCE: Social History   Tobacco Use  . Smoking status: Current Every Day Smoker  Packs/day: 0.50    Years: 40.00    Pack years: 20.00    Types: Cigarettes  . Smokeless tobacco: Never Used  Vaping Use  . Vaping Use: Never used  Substance Use Topics  . Alcohol use: No    Alcohol/week: 0.0 standard drinks  . Drug use: Yes    Frequency: 3.0 times per week    Types: Marijuana     Colonoscopy:  PAP:  Bone density:   Lipid panel:  Allergies  Allergen Reactions  . Imitrex [Sumatriptan] Anaphylaxis  . Penicillins Rash    Did it involve swelling of the face/tongue/throat, SOB, or low BP? Unknown Did it involve sudden or severe rash/hives, skin peeling, or any reaction on the inside of your mouth or nose? Unknown Did you need to seek medical attention at a hospital or doctor's office? Unknown When did it last happen?? If all above answers are "NO", may proceed with cephalosporin use.     Current Outpatient Medications  Medication Sig Dispense Refill  . acetaminophen (TYLENOL) 500 MG tablet Take 500-1,000 mg by mouth every 6 (six) hours as needed (for pain.).    Marland Kitchen albuterol (VENTOLIN HFA) 108 (90 Base) MCG/ACT inhaler Inhale 1-2 puffs into the lungs every 6 (six) hours as needed for wheezing or shortness of breath.    . ALPRAZolam (XANAX) 0.5 MG tablet Take 2 tablets (1 mg total) by mouth at bedtime as needed for anxiety. 60 tablet 0  . amLODipine (NORVASC) 5 MG tablet Take 1 tablet (5 mg total) by mouth daily. 30 tablet 6  . DULoxetine (CYMBALTA) 20 MG capsule Take 2 capsules (40 mg total) by mouth daily. 30 capsule 2  . lidocaine-prilocaine (EMLA) cream Apply to affected area once 30 g 3  . Melatonin 5 MG TABS Take 1 tablet by mouth at bedtime and may repeat dose one time if needed.    . ondansetron (ZOFRAN) 8 MG tablet TAKE 1 TABLET BY MOUTH 2 TIMES DAILY AS NEEDED 60 tablet 2  . potassium chloride SA (KLOR-CON) 20 MEQ tablet Take 1 tablet (20 mEq total) by mouth 2 (two) times daily. 10 tablet 0  . prochlorperazine (COMPAZINE) 10 MG tablet TAKE 1 TABLET BY MOUTH EVERY 6 HOURS AS NEEDED (NAUSEA OR VOMITING) 60 tablet 2  . Pseudoeph-Doxylamine-DM-APAP (NYQUIL PO) Take 1-2 capsules by mouth at bedtime as needed (sleep.).     Marland Kitchen tetrahydrozoline 0.05 % ophthalmic solution Place 1-2 drops into both eyes 3 (three) times daily as needed (dry/irritated eyes).    . traMADol (ULTRAM) 50 MG tablet Take 1 tablet  (50 mg total) by mouth 2 (two) times daily. 60 tablet 0   No current facility-administered medications for this visit.   Facility-Administered Medications Ordered in Other Visits  Medication Dose Route Frequency Provider Last Rate Last Admin  . sodium chloride flush (NS) 0.9 % injection 10 mL  10 mL Intravenous PRN Lloyd Huger, MD      . sodium chloride flush (NS) 0.9 % injection 10 mL  10 mL Intravenous PRN Lloyd Huger, MD   10 mL at 01/13/20 0851    OBJECTIVE: Vitals:   03/09/20 0851  BP: 121/83  Pulse: 96  Resp: 20  Temp: 97.8 F (36.6 C)  SpO2: 97%     Body mass index is 24.93 kg/m.    ECOG FS:0 - Asymptomatic  General: Well-developed, well-nourished, no acute distress. Eyes: Pink conjunctiva, anicteric sclera. HEENT: Normocephalic, moist mucous membranes. Lungs: No audible wheezing or coughing. Heart:  Regular rate and rhythm. Abdomen: Soft, nontender, no obvious distention. Musculoskeletal: No edema, cyanosis, or clubbing. Neuro: Alert, answering all questions appropriately. Cranial nerves grossly intact. Skin: No rashes or petechiae noted. Psych: Normal affect.   LAB RESULTS:  Lab Results  Component Value Date   NA 132 (L) 03/09/2020   K 3.2 (L) 03/09/2020   CL 97 (L) 03/09/2020   CO2 24 03/09/2020   GLUCOSE 142 (H) 03/09/2020   BUN 8 03/09/2020   CREATININE 0.89 03/09/2020   CALCIUM 9.0 03/09/2020   PROT 7.2 03/09/2020   ALBUMIN 3.9 03/09/2020   AST 19 03/09/2020   ALT 14 03/09/2020   ALKPHOS 94 03/09/2020   BILITOT 0.5 03/09/2020   GFRNONAA >60 03/09/2020   GFRAA >60 03/09/2020    Lab Results  Component Value Date   WBC 6.9 03/09/2020   NEUTROABS 5.1 03/09/2020   HGB 12.2 03/09/2020   HCT 34.5 (L) 03/09/2020   MCV 94.5 03/09/2020   PLT 289 03/09/2020     STUDIES: ECHOCARDIOGRAM COMPLETE  Result Date: 03/01/2020    ECHOCARDIOGRAM REPORT   Patient Name:   TIANNA BAUS Date of Exam: 03/01/2020 Medical Rec #:  [id number]         Height:       67.0 in Accession #:    [id number]       Weight:       164.6 lb Date of Birth:  06-01-1966       BSA:          1.862 m Patient Age:    53 years         BP:           132/88 mmHg Patient Gender: F                HR:           92 bpm. Exam Location:  [place] Procedure: 2D Echo, Cardiac Doppler and Color Doppler Indications:    R55 Syncope  History:        Patient has no prior history of Echocardiogram examinations.                 COPD; Risk Factors:Current Smoker, Hypertension and                 Chemotherapy.  Sonographer:    Quentin Ore RDMS, RVT, RDCS Referring Phys: [id number] CHRISTOPHER END  Sonographer Comments: Suboptimal parasternal window. IMPRESSIONS  1. Left ventricular ejection fraction, by estimation, is 60 to 65%. The left ventricle has normal function. The left ventricle has no regional wall motion abnormalities. There is mild left ventricular hypertrophy. Left ventricular diastolic parameters are consistent with Grade I diastolic dysfunction (impaired relaxation). Elevated left atrial pressure.  2. Right ventricular systolic function is normal. The right ventricular size is normal.  3. The mitral valve is grossly normal. Trivial mitral valve regurgitation. No evidence of mitral stenosis.  4. The aortic valve was not well visualized. Aortic valve regurgitation is not visualized. No aortic stenosis is present.  5. Pulmonic valve regurgitation not well assessed.  6. The inferior vena cava is normal in size with greater than 50% respiratory variability, suggesting right atrial pressure of 3 mmHg. FINDINGS  Left Ventricle: Left ventricular ejection fraction, by estimation, is 60 to 65%. The left ventricle has normal function. The left ventricle has no regional wall motion abnormalities. The left ventricular internal cavity size was normal in size. There is  mild left ventricular hypertrophy. Left ventricular  diastolic parameters are consistent with Grade I diastolic dysfunction (impaired  relaxation). Elevated left atrial pressure. Right Ventricle: The right ventricular size is normal. Right vetricular wall thickness was not assessed. Right ventricular systolic function is normal. Left Atrium: Left atrial size was normal in size. Right Atrium: Right atrial size was normal in size. Pericardium: There is no evidence of pericardial effusion. Mitral Valve: The mitral valve is grossly normal. Trivial mitral valve regurgitation. No evidence of mitral valve stenosis. Tricuspid Valve: The tricuspid valve is grossly normal. Tricuspid valve regurgitation is trivial. Aortic Valve: The aortic valve was not well visualized. Aortic valve regurgitation is not visualized. No aortic stenosis is present. Aortic valve mean gradient measures 3.0 mmHg. Aortic valve peak gradient measures 5.6 mmHg. Aortic valve area, by VTI measures 4.37 cm. Pulmonic Valve: The pulmonic valve was not well visualized. Pulmonic valve regurgitation not well assessed. Aorta: The aortic root and ascending aorta are structurally normal, with no evidence of dilitation. Pulmonary Artery: The pulmonary artery is not well seen. Venous: The inferior vena cava is normal in size with greater than 50% respiratory variability, suggesting right atrial pressure of 3 mmHg. IAS/Shunts: The interatrial septum was not well visualized.  LEFT VENTRICLE PLAX 2D LVIDd:         3.62 cm     Diastology LVIDs:         2.75 cm     LV e' lateral:   8.81 cm/s LV PW:         1.28 cm     LV E/e' lateral: 9.3 LV IVS:        1.21 cm     LV e' medial:    5.00 cm/s LVOT diam:     2.20 cm     LV E/e' medial:  16.4 LV SV:         79 LV SV Index:   42          2D Longitudinal Strain LVOT Area:     3.80 cm    2D Strain GLS Avg:     -15.8 %  LV Volumes (MOD) LV vol d, MOD A2C: 64.4 ml LV vol d, MOD A4C: 72.2 ml LV vol s, MOD A2C: 22.8 ml LV vol s, MOD A4C: 28.4 ml LV SV MOD A2C:     41.6 ml LV SV MOD A4C:     72.2 ml LV SV MOD BP:      44.3 ml RIGHT VENTRICLE            IVC RV  Basal diam:  3.20 cm    IVC diam: 1.60 cm RV S prime:     9.03 cm/s TAPSE (M-mode): 1.8 cm LEFT ATRIUM             Index       RIGHT ATRIUM           Index LA diam:        3.00 cm 1.61 cm/m  RA Area:     11.20 cm LA Vol (A2C):   54.8 ml 29.44 ml/m RA Volume:   25.10 ml  13.48 ml/m LA Vol (A4C):   49.4 ml 26.54 ml/m LA Biplane Vol: 52.1 ml 27.99 ml/m  AORTIC VALVE                   PULMONIC VALVE AV Area (Vmax):    3.77 cm    PV Vmax:       1.11 m/s AV Area (Vmean):  3.59 cm    PV Peak grad:  4.9 mmHg AV Area (VTI):     4.37 cm AV Vmax:           118.00 cm/s AV Vmean:          81.000 cm/s AV VTI:            0.180 m AV Peak Grad:      5.6 mmHg AV Mean Grad:      3.0 mmHg LVOT Vmax:         117.00 cm/s LVOT Vmean:        76.500 cm/s LVOT VTI:          0.207 m LVOT/AV VTI ratio: 1.15  AORTA Ao Root diam: 3.30 cm Ao Asc diam:  2.90 cm Ao Arch diam: 2.8 cm MITRAL VALVE MV Area (PHT): 1.97 cm     SHUNTS MV Decel Time: 386 msec     Systemic VTI:  0.21 m MV E velocity: 82.00 cm/s   Systemic Diam: 2.20 cm MV A velocity: 139.00 cm/s MV E/A ratio:  0.59 Christopher End MD Electronically signed by [name] MD Signature Date/Time: 03/01/2020/6:45:52 PM    Final     ASSESSMENT: Stage IIIa ER/PR positive, 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  PLAN:    1. 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  Case also discussed with surgery.  Given the size and stage of patient's tumor, she will benefit from neoadjuvant chemotherapy with Adriamycin, Cytoxan and Udenyca support followed by weekly Taxol.  MUGA scan from November 26, 2019 revealed an EF of approximately 61%. CT scan of the chest, abdomen, and pelvis on December 01, 2019 reviewed independently with no obvious evidence of metastatic disease.  Patient completed 4 cycles of Adriamycin and Cytoxan.  Proceed with cycle 6 of  weekly Taxol today.  Return to clinic in 1 week for laboratory work and treatment only and then in 2 weeks for further evaluation and consideration of cycle 8.   2.  History of DCIS: Status post lumpectomy and XRT.  Patient reports she did not take any adjuvant hormonal therapy.  Given this history, have referred patient to genetic counseling for evaluation and testing. 3.  Anxiety/insomnia: Continue 1 mg Xanax nightly as needed.  4.  Neutropenia: Resolved. 5.  Hypokalemia: Resolved.  Patient agreed to take her oral supplementation on a regular basis. 6.  Tooth abscess: Resolved.  Patient will require an appointment with dentistry or oral surgery at the conclusion of her treatments. 7.  Back pain: Patient was instructed to use ibuprofen sparingly.  Continue tramadol as needed. 8.  Myalgias/restless leg: Patient does not complain of this today. 9.  Palpitations/syncope: Appreciate cardiology input.  Follow-up with them as indicated. 10.  Depression: Increase Cymbalta to 60 mg daily. Patient expressed understanding and was in agreement with this plan. She also understands that She can call clinic at any time with any questions, concerns, or complaints.   Cancer Staging Primary cancer of upper outer quadrant of right female breast [facility]) Staging form: Breast, AJCC 8th Edition - Clinical stage from 11/26/2019: Stage IIIA (cT2, cN2a, cM0, G3, ER+, PR+, HER2-) - Signed by Lloyd Huger, MD on 11/26/2019   Lloyd Huger, MD   03/09/2020 1:41 PM

## 2020-03-08 ENCOUNTER — Ambulatory Visit: Payer: Medicaid Other | Admitting: Family

## 2020-03-09 ENCOUNTER — Inpatient Hospital Stay: Payer: Medicaid Other

## 2020-03-09 ENCOUNTER — Other Ambulatory Visit: Payer: Self-pay

## 2020-03-09 ENCOUNTER — Encounter: Payer: Self-pay | Admitting: Oncology

## 2020-03-09 ENCOUNTER — Telehealth: Payer: Self-pay | Admitting: General Practice

## 2020-03-09 ENCOUNTER — Inpatient Hospital Stay: Payer: Medicaid Other | Attending: Oncology

## 2020-03-09 ENCOUNTER — Inpatient Hospital Stay (HOSPITAL_BASED_OUTPATIENT_CLINIC_OR_DEPARTMENT_OTHER): Payer: Medicaid Other | Admitting: Oncology

## 2020-03-09 VITALS — BP 121/83 | HR 96 | Temp 97.8°F | Resp 20 | Wt 159.2 lb

## 2020-03-09 DIAGNOSIS — C50411 Malignant neoplasm of upper-outer quadrant of right female breast: Secondary | ICD-10-CM

## 2020-03-09 DIAGNOSIS — Z5111 Encounter for antineoplastic chemotherapy: Secondary | ICD-10-CM | POA: Insufficient documentation

## 2020-03-09 DIAGNOSIS — Z006 Encounter for examination for normal comparison and control in clinical research program: Secondary | ICD-10-CM | POA: Insufficient documentation

## 2020-03-09 DIAGNOSIS — Z95828 Presence of other vascular implants and grafts: Secondary | ICD-10-CM

## 2020-03-09 LAB — CBC WITH DIFFERENTIAL/PLATELET
Abs Immature Granulocytes: 0.04 10*3/uL (ref 0.00–0.07)
Basophils Absolute: 0.1 10*3/uL (ref 0.0–0.1)
Basophils Relative: 1 %
Eosinophils Absolute: 0.1 10*3/uL (ref 0.0–0.5)
Eosinophils Relative: 1 %
HCT: 34.5 % — ABNORMAL LOW (ref 36.0–46.0)
Hemoglobin: 12.2 g/dL (ref 12.0–15.0)
Immature Granulocytes: 1 %
Lymphocytes Relative: 14 %
Lymphs Abs: 1 10*3/uL (ref 0.7–4.0)
MCH: 33.4 pg (ref 26.0–34.0)
MCHC: 35.4 g/dL (ref 30.0–36.0)
MCV: 94.5 fL (ref 80.0–100.0)
Monocytes Absolute: 0.6 10*3/uL (ref 0.1–1.0)
Monocytes Relative: 9 %
Neutro Abs: 5.1 10*3/uL (ref 1.7–7.7)
Neutrophils Relative %: 74 %
Platelets: 289 10*3/uL (ref 150–400)
RBC: 3.65 MIL/uL — ABNORMAL LOW (ref 3.87–5.11)
RDW: 15.5 % (ref 11.5–15.5)
WBC: 6.9 10*3/uL (ref 4.0–10.5)
nRBC: 0 % (ref 0.0–0.2)

## 2020-03-09 LAB — COMPREHENSIVE METABOLIC PANEL
ALT: 14 U/L (ref 0–44)
AST: 19 U/L (ref 15–41)
Albumin: 3.9 g/dL (ref 3.5–5.0)
Alkaline Phosphatase: 94 U/L (ref 38–126)
Anion gap: 11 (ref 5–15)
BUN: 8 mg/dL (ref 6–20)
CO2: 24 mmol/L (ref 22–32)
Calcium: 9 mg/dL (ref 8.9–10.3)
Chloride: 97 mmol/L — ABNORMAL LOW (ref 98–111)
Creatinine, Ser: 0.89 mg/dL (ref 0.44–1.00)
GFR calc Af Amer: >60 mL/min (ref >60)
GFR calc non Af Amer: >60 mL/min (ref >60)
Glucose, Bld: 142 mg/dL — ABNORMAL HIGH (ref 70–99)
Potassium: 3.2 mmol/L — ABNORMAL LOW (ref 3.5–5.1)
Sodium: 132 mmol/L — ABNORMAL LOW (ref 135–145)
Total Bilirubin: 0.5 mg/dL (ref 0.3–1.2)
Total Protein: 7.2 g/dL (ref 6.5–8.1)

## 2020-03-09 MED ORDER — FAMOTIDINE IN NACL 20-0.9 MG/50ML-% IV SOLN
20.0000 mg | Freq: Once | INTRAVENOUS | Status: AC
  Administered 2020-03-09: 20 mg via INTRAVENOUS
  Filled 2020-03-09: qty 50

## 2020-03-09 MED ORDER — DIPHENHYDRAMINE HCL 50 MG/ML IJ SOLN
25.0000 mg | Freq: Once | INTRAMUSCULAR | Status: AC
  Administered 2020-03-09: 25 mg via INTRAVENOUS
  Filled 2020-03-09: qty 1

## 2020-03-09 MED ORDER — SODIUM CHLORIDE 0.9% FLUSH
10.0000 mL | INTRAVENOUS | Status: DC | PRN
  Administered 2020-03-09: 10 mL via INTRAVENOUS
  Filled 2020-03-09: qty 10

## 2020-03-09 MED ORDER — HEPARIN SOD (PORK) LOCK FLUSH 100 UNIT/ML IV SOLN
500.0000 [IU] | Freq: Once | INTRAVENOUS | Status: AC | PRN
  Administered 2020-03-09: 500 [IU]
  Filled 2020-03-09: qty 5

## 2020-03-09 MED ORDER — DULOXETINE HCL 20 MG PO CPEP: 60.0000 mg | ORAL_CAPSULE | Freq: Every day | ORAL | 2 refills | Status: DC

## 2020-03-09 MED ORDER — SODIUM CHLORIDE 0.9 % IV SOLN
Freq: Once | INTRAVENOUS | Status: AC
  Filled 2020-03-09: qty 250

## 2020-03-09 MED ORDER — SODIUM CHLORIDE 0.9 % IV SOLN
10.0000 mg | Freq: Once | INTRAVENOUS | Status: AC
  Administered 2020-03-09: 10 mg via INTRAVENOUS
  Filled 2020-03-09: qty 10

## 2020-03-09 MED ORDER — HEPARIN SOD (PORK) LOCK FLUSH 100 UNIT/ML IV SOLN
INTRAVENOUS | Status: AC
  Filled 2020-03-09: qty 5

## 2020-03-09 MED ORDER — SODIUM CHLORIDE 0.9 % IV SOLN
80.0000 mg/m2 | Freq: Once | INTRAVENOUS | Status: AC
  Administered 2020-03-09: 150 mg via INTRAVENOUS
  Filled 2020-03-09: qty 25

## 2020-03-09 NOTE — Progress Notes (Signed)
Patient stated that she is having problems sleeping and that she is also having frequent headaches. Level 8. Monday patient stated she had a headache so bad it made her vomit. Patient stated she has also been experiencing some dizziness.

## 2020-03-09 NOTE — Progress Notes (Signed)
Sodium 132 and Potassium 3.2, Per Dr. Grayland Ormond okay to proceed with treatment as scheduled.

## 2020-03-09 NOTE — Telephone Encounter (Signed)
Individual has been contacted and notified that they are now ineligible to become a pt as they now have medicaid.

## 2020-03-10 ENCOUNTER — Other Ambulatory Visit: Payer: Self-pay | Admitting: Emergency Medicine

## 2020-03-10 ENCOUNTER — Ambulatory Visit: Payer: Self-pay | Admitting: Physician Assistant

## 2020-03-10 DIAGNOSIS — C50411 Malignant neoplasm of upper-outer quadrant of right female breast: Secondary | ICD-10-CM

## 2020-03-10 MED ORDER — DULOXETINE HCL 20 MG PO CPEP: 60.0000 mg | ORAL_CAPSULE | Freq: Every day | ORAL | 2 refills | Status: DC

## 2020-03-15 ENCOUNTER — Other Ambulatory Visit: Payer: Self-pay | Admitting: Oncology

## 2020-03-15 NOTE — Telephone Encounter (Signed)
Josh- Please advise 

## 2020-03-16 ENCOUNTER — Inpatient Hospital Stay: Payer: Medicaid Other

## 2020-03-16 ENCOUNTER — Other Ambulatory Visit: Payer: Self-pay

## 2020-03-16 VITALS — BP 128/88 | HR 87 | Temp 97.8°F | Resp 20 | Wt 156.6 lb

## 2020-03-16 DIAGNOSIS — Z5111 Encounter for antineoplastic chemotherapy: Secondary | ICD-10-CM | POA: Diagnosis not present

## 2020-03-16 DIAGNOSIS — C50411 Malignant neoplasm of upper-outer quadrant of right female breast: Secondary | ICD-10-CM

## 2020-03-16 LAB — CBC WITH DIFFERENTIAL/PLATELET
Abs Immature Granulocytes: 0.02 10*3/uL (ref 0.00–0.07)
Basophils Absolute: 0 10*3/uL (ref 0.0–0.1)
Basophils Relative: 1 %
Eosinophils Absolute: 0.1 10*3/uL (ref 0.0–0.5)
Eosinophils Relative: 1 %
HCT: 34.6 % — ABNORMAL LOW (ref 36.0–46.0)
Hemoglobin: 12.2 g/dL (ref 12.0–15.0)
Immature Granulocytes: 0 %
Lymphocytes Relative: 19 %
Lymphs Abs: 1 10*3/uL (ref 0.7–4.0)
MCH: 33.8 pg (ref 26.0–34.0)
MCHC: 35.3 g/dL (ref 30.0–36.0)
MCV: 95.8 fL (ref 80.0–100.0)
Monocytes Absolute: 0.6 10*3/uL (ref 0.1–1.0)
Monocytes Relative: 10 %
Neutro Abs: 3.7 10*3/uL (ref 1.7–7.7)
Neutrophils Relative %: 69 %
Platelets: 275 10*3/uL (ref 150–400)
RBC: 3.61 MIL/uL — ABNORMAL LOW (ref 3.87–5.11)
RDW: 14.6 % (ref 11.5–15.5)
WBC: 5.3 10*3/uL (ref 4.0–10.5)
nRBC: 0 % (ref 0.0–0.2)

## 2020-03-16 LAB — COMPREHENSIVE METABOLIC PANEL
ALT: 15 U/L (ref 0–44)
AST: 20 U/L (ref 15–41)
Albumin: 4 g/dL (ref 3.5–5.0)
Alkaline Phosphatase: 83 U/L (ref 38–126)
Anion gap: 9 (ref 5–15)
BUN: 6 mg/dL (ref 6–20)
CO2: 23 mmol/L (ref 22–32)
Calcium: 8.6 mg/dL — ABNORMAL LOW (ref 8.9–10.3)
Chloride: 98 mmol/L (ref 98–111)
Creatinine, Ser: 0.87 mg/dL (ref 0.44–1.00)
GFR calc Af Amer: >60 mL/min (ref >60)
GFR calc non Af Amer: >60 mL/min (ref >60)
Glucose, Bld: 87 mg/dL (ref 70–99)
Potassium: 3.3 mmol/L — ABNORMAL LOW (ref 3.5–5.1)
Sodium: 130 mmol/L — ABNORMAL LOW (ref 135–145)
Total Bilirubin: 0.5 mg/dL (ref 0.3–1.2)
Total Protein: 7 g/dL (ref 6.5–8.1)

## 2020-03-16 MED ORDER — HEPARIN SOD (PORK) LOCK FLUSH 100 UNIT/ML IV SOLN
500.0000 [IU] | Freq: Once | INTRAVENOUS | Status: AC | PRN
  Administered 2020-03-16: 500 [IU]
  Filled 2020-03-16: qty 5

## 2020-03-16 MED ORDER — DIPHENHYDRAMINE HCL 50 MG/ML IJ SOLN
25.0000 mg | Freq: Once | INTRAMUSCULAR | Status: AC
  Administered 2020-03-16: 25 mg via INTRAVENOUS
  Filled 2020-03-16: qty 1

## 2020-03-16 MED ORDER — SODIUM CHLORIDE 0.9 % IV SOLN
80.0000 mg/m2 | Freq: Once | INTRAVENOUS | Status: AC
  Administered 2020-03-16: 150 mg via INTRAVENOUS
  Filled 2020-03-16: qty 25

## 2020-03-16 MED ORDER — ALPRAZOLAM 0.5 MG PO TABS: 1.0000 mg | ORAL_TABLET | Freq: Every evening | ORAL | 0 refills | Status: DC | PRN

## 2020-03-16 MED ORDER — SODIUM CHLORIDE 0.9% FLUSH
10.0000 mL | Freq: Once | INTRAVENOUS | Status: AC
  Administered 2020-03-16: 10 mL via INTRAVENOUS
  Filled 2020-03-16: qty 10

## 2020-03-16 MED ORDER — SODIUM CHLORIDE 0.9 % IV SOLN
10.0000 mg | Freq: Once | INTRAVENOUS | Status: AC
  Administered 2020-03-16: 10 mg via INTRAVENOUS
  Filled 2020-03-16: qty 10

## 2020-03-16 MED ORDER — SODIUM CHLORIDE 0.9 % IV SOLN
Freq: Once | INTRAVENOUS | Status: AC
  Filled 2020-03-16: qty 250

## 2020-03-16 MED ORDER — HEPARIN SOD (PORK) LOCK FLUSH 100 UNIT/ML IV SOLN
INTRAVENOUS | Status: AC
  Filled 2020-03-16: qty 5

## 2020-03-16 MED ORDER — FAMOTIDINE IN NACL 20-0.9 MG/50ML-% IV SOLN
20.0000 mg | Freq: Once | INTRAVENOUS | Status: AC
  Administered 2020-03-16: 20 mg via INTRAVENOUS
  Filled 2020-03-16: qty 50

## 2020-03-17 NOTE — Progress Notes (Signed)
Patient assistance with July and August rent through [facility].

## 2020-03-22 ENCOUNTER — Encounter: Payer: Self-pay | Admitting: Oncology

## 2020-03-22 NOTE — Progress Notes (Signed)
Patient was called for pre screening. She states she has had some nausea usually on weekends. She report headache and rates pain at 3. She also reports some diarrhea last Friday and Saturday. No other questions or concerns mentioned at this time.

## 2020-03-23 ENCOUNTER — Inpatient Hospital Stay: Payer: Medicaid Other

## 2020-03-23 ENCOUNTER — Inpatient Hospital Stay (HOSPITAL_BASED_OUTPATIENT_CLINIC_OR_DEPARTMENT_OTHER): Payer: Medicaid Other | Admitting: Oncology

## 2020-03-23 ENCOUNTER — Other Ambulatory Visit: Payer: Self-pay

## 2020-03-23 VITALS — BP 110/79 | HR 81 | Temp 97.6°F | Resp 20 | Wt 155.0 lb

## 2020-03-23 VITALS — BP 100/77 | HR 87 | Temp 98.0°F | Wt 155.8 lb

## 2020-03-23 DIAGNOSIS — R197 Diarrhea, unspecified: Secondary | ICD-10-CM

## 2020-03-23 DIAGNOSIS — Z95828 Presence of other vascular implants and grafts: Secondary | ICD-10-CM

## 2020-03-23 DIAGNOSIS — I959 Hypotension, unspecified: Secondary | ICD-10-CM | POA: Diagnosis not present

## 2020-03-23 DIAGNOSIS — G4700 Insomnia, unspecified: Secondary | ICD-10-CM

## 2020-03-23 DIAGNOSIS — C50411 Malignant neoplasm of upper-outer quadrant of right female breast: Secondary | ICD-10-CM | POA: Diagnosis not present

## 2020-03-23 DIAGNOSIS — Z5111 Encounter for antineoplastic chemotherapy: Secondary | ICD-10-CM | POA: Diagnosis not present

## 2020-03-23 LAB — CBC WITH DIFFERENTIAL/PLATELET
Abs Immature Granulocytes: 0.04 10*3/uL (ref 0.00–0.07)
Basophils Absolute: 0 10*3/uL (ref 0.0–0.1)
Basophils Relative: 1 %
Eosinophils Absolute: 0.1 10*3/uL (ref 0.0–0.5)
Eosinophils Relative: 2 %
HCT: 36.7 % (ref 36.0–46.0)
Hemoglobin: 12.9 g/dL (ref 12.0–15.0)
Immature Granulocytes: 1 %
Lymphocytes Relative: 20 %
Lymphs Abs: 1.1 10*3/uL (ref 0.7–4.0)
MCH: 33.5 pg (ref 26.0–34.0)
MCHC: 35.1 g/dL (ref 30.0–36.0)
MCV: 95.3 fL (ref 80.0–100.0)
Monocytes Absolute: 0.4 10*3/uL (ref 0.1–1.0)
Monocytes Relative: 8 %
Neutro Abs: 3.7 10*3/uL (ref 1.7–7.7)
Neutrophils Relative %: 68 %
Platelets: 245 10*3/uL (ref 150–400)
RBC: 3.85 MIL/uL — ABNORMAL LOW (ref 3.87–5.11)
RDW: 13.8 % (ref 11.5–15.5)
WBC: 5.4 10*3/uL (ref 4.0–10.5)
nRBC: 0 % (ref 0.0–0.2)

## 2020-03-23 LAB — COMPREHENSIVE METABOLIC PANEL
ALT: 19 U/L (ref 0–44)
AST: 24 U/L (ref 15–41)
Albumin: 4 g/dL (ref 3.5–5.0)
Alkaline Phosphatase: 81 U/L (ref 38–126)
Anion gap: 9 (ref 5–15)
BUN: 16 mg/dL (ref 6–20)
CO2: 22 mmol/L (ref 22–32)
Calcium: 9 mg/dL (ref 8.9–10.3)
Chloride: 103 mmol/L (ref 98–111)
Creatinine, Ser: 0.75 mg/dL (ref 0.44–1.00)
GFR calc Af Amer: >60 mL/min (ref >60)
GFR calc non Af Amer: >60 mL/min (ref >60)
Glucose, Bld: 94 mg/dL (ref 70–99)
Potassium: 3.5 mmol/L (ref 3.5–5.1)
Sodium: 134 mmol/L — ABNORMAL LOW (ref 135–145)
Total Bilirubin: 0.4 mg/dL (ref 0.3–1.2)
Total Protein: 7.1 g/dL (ref 6.5–8.1)

## 2020-03-23 MED ORDER — DIPHENHYDRAMINE HCL 50 MG/ML IJ SOLN
25.0000 mg | Freq: Once | INTRAMUSCULAR | Status: AC
  Administered 2020-03-23: 25 mg via INTRAVENOUS
  Filled 2020-03-23: qty 1

## 2020-03-23 MED ORDER — DIPHENOXYLATE-ATROPINE 2.5-0.025 MG PO TABS
1.0000 | ORAL_TABLET | Freq: Four times a day (QID) | ORAL | 0 refills | Status: DC | PRN
Start: 2020-03-23 — End: 2020-04-13

## 2020-03-23 MED ORDER — SODIUM CHLORIDE 0.9% FLUSH
10.0000 mL | INTRAVENOUS | Status: DC | PRN
  Administered 2020-03-23: 10 mL via INTRAVENOUS
  Filled 2020-03-23: qty 10

## 2020-03-23 MED ORDER — FAMOTIDINE IN NACL 20-0.9 MG/50ML-% IV SOLN
20.0000 mg | Freq: Once | INTRAVENOUS | Status: AC
  Administered 2020-03-23: 20 mg via INTRAVENOUS
  Filled 2020-03-23: qty 50

## 2020-03-23 MED ORDER — TRAZODONE HCL 50 MG PO TABS: 50.0000 mg | ORAL_TABLET | Freq: Every day | ORAL | 0 refills | Status: DC

## 2020-03-23 MED ORDER — SODIUM CHLORIDE 0.9 % IV SOLN
80.0000 mg/m2 | Freq: Once | INTRAVENOUS | Status: AC
  Administered 2020-03-23: 150 mg via INTRAVENOUS
  Filled 2020-03-23: qty 25

## 2020-03-23 MED ORDER — SODIUM CHLORIDE 0.9 % IV SOLN
10.0000 mg | Freq: Once | INTRAVENOUS | Status: AC
  Administered 2020-03-23: 10 mg via INTRAVENOUS
  Filled 2020-03-23: qty 10

## 2020-03-23 MED ORDER — HEPARIN SOD (PORK) LOCK FLUSH 100 UNIT/ML IV SOLN
500.0000 [IU] | Freq: Once | INTRAVENOUS | Status: AC | PRN
  Administered 2020-03-23: 500 [IU]
  Filled 2020-03-23: qty 5

## 2020-03-23 MED ORDER — HEPARIN SOD (PORK) LOCK FLUSH 100 UNIT/ML IV SOLN
INTRAVENOUS | Status: AC
  Filled 2020-03-23: qty 5

## 2020-03-23 MED ORDER — SODIUM CHLORIDE 0.9 % IV SOLN
Freq: Once | INTRAVENOUS | Status: AC
  Filled 2020-03-23: qty 250

## 2020-03-23 NOTE — Progress Notes (Signed)
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  INTERVAL HISTORY: Mrs. Pangborn is a 54 year old female who presents to clinic today for consideration of cycle 7 of weekly Taxol.  She continues to have insomnia even after initiating Xanax at bedtime.  Endorses chronic fatigue and weakness that is worse the first few days after her chemo.  She has occasional nausea but no vomiting.  Notes new diarrhea for several days after her chemotherapy.  Has not tried any medications for this yet.  Consistency is liquid with no distinct smell.  Notes low blood pressures on Friday and Saturday after treatment ranging from 60-10 systolic.  She does not feel dizzy but is concerned about another syncopal episode.  She denies any recent fevers or illness.  Her appetite is stable.  She denies any chest pain, shortness of breath, cough or hemoptysis.   REVIEW OF SYSTEMS:   Review of Systems  Constitutional: Positive for malaise/fatigue. Negative for chills, fever and weight loss.  HENT: Negative for congestion, ear pain and tinnitus.   Eyes: Negative.  Negative for blurred vision and double vision.  Respiratory: Negative.  Negative for cough, sputum production and shortness of breath.   Cardiovascular: Negative.  Negative for chest pain, palpitations and leg swelling.  Gastrointestinal: Positive for diarrhea and nausea. Negative for abdominal pain, constipation and vomiting.  Genitourinary: Negative for dysuria,  frequency and urgency.  Musculoskeletal: Negative for back pain and falls.  Skin: Negative.  Negative for rash.  Neurological: Positive for weakness. Negative for headaches.  Endo/Heme/Allergies: Negative.  Does not bruise/bleed easily.  Psychiatric/Behavioral: Positive for depression. The patient has insomnia. The patient is not nervous/anxious.     As per HPI. Otherwise, a complete review of systems is negative.  PAST MEDICAL HISTORY: Past Medical History:  Diagnosis Date  . Brain tumor (benign) ([place])   . Breast cancer ([place]) 1999   Status post right breast lumpectomy and radiation  . COPD (chronic obstructive pulmonary disease) ([place])   . Depression   . History of chemotherapy 1999  . History of seizure disorder   . Hypertension   . Personal history of radiation therapy 1999  . Pneumonia     PAST SURGICAL HISTORY: Past Surgical History:  Procedure Laterality Date  . BRAIN SURGERY    . Brain tumor resection  1991  . BREAST BIOPSY Right 1999   Positive  . BREAST BIOPSY Right 11/10/2019   [place] bx 10:00 heart marker, path pending  . BREAST BIOPSY Right 11/10/2019   [place] bx axilla, hydromark 4, path pending  . BREAST BIOPSY Left 11/10/2019   [place] bx 2:00, venus marker, path pending  . PORTACATH PLACEMENT Left 11/30/2019   Procedure: INSERTION PORT-A-CATH;  Surgeon: Robert Bellow, MD;  Location: [facility] ORS;  Service: General;  Laterality: Left;  . Right breast lumpectomy    . TONSILLECTOMY    . TUBAL LIGATION      FAMILY HISTORY: Family History  Problem Relation Age of Onset  . Depression Mother   . CAD Father   . Hypertension Father   .  Lung cancer Father   . Coronary artery disease Father 39  . Breast cancer Maternal Grandmother     ADVANCED DIRECTIVES (Y/N):  N  HEALTH MAINTENANCE: Social History   Tobacco Use  . Smoking status: Current Every Day Smoker    Packs/day: 0.50    Years: 40.00    Pack years: 20.00    Types: Cigarettes  . Smokeless tobacco: Never  Used  Vaping Use  . Vaping Use: Never used  Substance Use Topics  . Alcohol use: No    Alcohol/week: 0.0 standard drinks  . Drug use: Yes    Frequency: 3.0 times per week    Types: Marijuana     Colonoscopy:  PAP:  Bone density:  Lipid panel:  Allergies  Allergen Reactions  . Imitrex [Sumatriptan] Anaphylaxis  . Penicillins Rash    Did it involve swelling of the face/tongue/throat, SOB, or low BP? Unknown Did it involve sudden or severe rash/hives, skin peeling, or any reaction on the inside of your mouth or nose? Unknown Did you need to seek medical attention at a hospital or doctor's office? Unknown When did it last happen?? If all above answers are "NO", may proceed with cephalosporin use.     Current Outpatient Medications  Medication Sig Dispense Refill  . acetaminophen (TYLENOL) 500 MG tablet Take 500-1,000 mg by mouth every 6 (six) hours as needed (for pain.).    Marland Kitchen albuterol (VENTOLIN HFA) 108 (90 Base) MCG/ACT inhaler Inhale 1-2 puffs into the lungs every 6 (six) hours as needed for wheezing or shortness of breath.    . ALPRAZolam (XANAX) 0.5 MG tablet Take 2 tablets (1 mg total) by mouth at bedtime as needed for anxiety. 60 tablet 0  . amLODipine (NORVASC) 5 MG tablet Take 1 tablet (5 mg total) by mouth daily. 30 tablet 6  . DULoxetine (CYMBALTA) 20 MG capsule Take 3 capsules (60 mg total) by mouth daily. 90 capsule 2  . lidocaine-prilocaine (EMLA) cream Apply to affected area once 30 g 3  . Melatonin 5 MG TABS Take 1 tablet by mouth at bedtime and may repeat dose one time if needed.    . ondansetron (ZOFRAN) 8 MG tablet TAKE 1 TABLET BY MOUTH 2 TIMES DAILY AS NEEDED 60 tablet 2  . potassium chloride SA (KLOR-CON) 20 MEQ tablet Take 1 tablet (20 mEq total) by mouth 2 (two) times daily. 10 tablet 0  . prochlorperazine (COMPAZINE) 10 MG tablet TAKE 1 TABLET BY MOUTH EVERY 6 HOURS AS NEEDED (NAUSEA OR VOMITING) 60 tablet 2  . Pseudoeph-Doxylamine-DM-APAP (NYQUIL PO)  Take 1-2 capsules by mouth at bedtime as needed (sleep.).     Marland Kitchen tetrahydrozoline 0.05 % ophthalmic solution Place 1-2 drops into both eyes 3 (three) times daily as needed (dry/irritated eyes).    . traMADol (ULTRAM) 50 MG tablet Take 1 tablet (50 mg total) by mouth 2 (two) times daily. 60 tablet 0  . Vilazodone HCl (VIIBRYD) 20 MG TABS Take 20 mg by mouth daily. (Patient not taking: Reported on 03/22/2020)     No current facility-administered medications for this visit.   Facility-Administered Medications Ordered in Other Visits  Medication Dose Route Frequency Provider Last Rate Last Admin  . sodium chloride flush (NS) 0.9 % injection 10 mL  10 mL Intravenous PRN Lloyd Huger, MD      . sodium chloride flush (NS) 0.9 % injection 10 mL  10 mL Intravenous PRN Lloyd Huger, MD   10 mL at 01/13/20 0851  .  sodium chloride flush (NS) 0.9 % injection 10 mL  10 mL Intravenous PRN Lloyd Huger, MD   10 mL at 03/23/20 0850    OBJECTIVE: There were no vitals filed for this visit.   There is no height or weight on file to calculate BMI.    ECOG FS:0 - Asymptomatic  Physical Exam Constitutional:      Appearance: Normal appearance.  HENT:     Head: Normocephalic and atraumatic.  Eyes:     Pupils: Pupils are equal, round, and reactive to light.  Cardiovascular:     Rate and Rhythm: Normal rate and regular rhythm.     Heart sounds: Normal heart sounds. No murmur heard.   Pulmonary:     Effort: Pulmonary effort is normal.     Breath sounds: Normal breath sounds. No wheezing.  Abdominal:     General: Bowel sounds are normal. There is no distension.     Palpations: Abdomen is soft.     Tenderness: There is no abdominal tenderness.  Musculoskeletal:        General: Normal range of motion.     Cervical back: Normal range of motion.  Skin:    General: Skin is warm and dry.     Findings: No rash.  Neurological:     Mental Status: She is alert and oriented to person, place, and  time.  Psychiatric:        Judgment: Judgment normal.     LAB RESULTS:  Lab Results  Component Value Date   NA 130 (L) 03/16/2020   K 3.3 (L) 03/16/2020   CL 98 03/16/2020   CO2 23 03/16/2020   GLUCOSE 87 03/16/2020   BUN 6 03/16/2020   CREATININE 0.87 03/16/2020   CALCIUM 8.6 (L) 03/16/2020   PROT 7.0 03/16/2020   ALBUMIN 4.0 03/16/2020   AST 20 03/16/2020   ALT 15 03/16/2020   ALKPHOS 83 03/16/2020   BILITOT 0.5 03/16/2020   GFRNONAA >60 03/16/2020   GFRAA >60 03/16/2020    Lab Results  Component Value Date   WBC 5.4 03/23/2020   NEUTROABS 3.7 03/23/2020   HGB 12.9 03/23/2020   HCT 36.7 03/23/2020   MCV 95.3 03/23/2020   PLT 245 03/23/2020     STUDIES: ECHOCARDIOGRAM COMPLETE  Result Date: 03/01/2020    ECHOCARDIOGRAM REPORT   Patient Name:   LASHUNDA GREIS Date of Exam: 03/01/2020 Medical Rec #:  [id number]        Height:       67.0 in Accession #:    [id number]       Weight:       164.6 lb Date of Birth:  05/16/1966       BSA:          1.862 m Patient Age:    12 years         BP:           132/88 mmHg Patient Gender: F                HR:           92 bpm. Exam Location:   Procedure: 2D Echo, Cardiac Doppler and Color Doppler Indications:    R55 Syncope  History:        Patient has no prior history of Echocardiogram examinations.                 COPD; Risk Factors:Current Smoker, Hypertension and  Chemotherapy.  Sonographer:    Pilar Jarvis RDMS, RVT, RDCS Referring Phys: [id number] CHRISTOPHER END  Sonographer Comments: Suboptimal parasternal window. IMPRESSIONS  1. Left ventricular ejection fraction, by estimation, is 60 to 65%. The left ventricle has normal function. The left ventricle has no regional wall motion abnormalities. There is mild left ventricular hypertrophy. Left ventricular diastolic parameters are consistent with Grade I diastolic dysfunction (impaired relaxation). Elevated left atrial pressure.  2. Right ventricular systolic function  is normal. The right ventricular size is normal.  3. The mitral valve is grossly normal. Trivial mitral valve regurgitation. No evidence of mitral stenosis.  4. The aortic valve was not well visualized. Aortic valve regurgitation is not visualized. No aortic stenosis is present.  5. Pulmonic valve regurgitation not well assessed.  6. The inferior vena cava is normal in size with greater than 50% respiratory variability, suggesting right atrial pressure of 3 mmHg. FINDINGS  Left Ventricle: Left ventricular ejection fraction, by estimation, is 60 to 65%. The left ventricle has normal function. The left ventricle has no regional wall motion abnormalities. The left ventricular internal cavity size was normal in size. There is  mild left ventricular hypertrophy. Left ventricular diastolic parameters are consistent with Grade I diastolic dysfunction (impaired relaxation). Elevated left atrial pressure. Right Ventricle: The right ventricular size is normal. Right vetricular wall thickness was not assessed. Right ventricular systolic function is normal. Left Atrium: Left atrial size was normal in size. Right Atrium: Right atrial size was normal in size. Pericardium: There is no evidence of pericardial effusion. Mitral Valve: The mitral valve is grossly normal. Trivial mitral valve regurgitation. No evidence of mitral valve stenosis. Tricuspid Valve: The tricuspid valve is grossly normal. Tricuspid valve regurgitation is trivial. Aortic Valve: The aortic valve was not well visualized. Aortic valve regurgitation is not visualized. No aortic stenosis is present. Aortic valve mean gradient measures 3.0 mmHg. Aortic valve peak gradient measures 5.6 mmHg. Aortic valve area, by VTI measures 4.37 cm. Pulmonic Valve: The pulmonic valve was not well visualized. Pulmonic valve regurgitation not well assessed. Aorta: The aortic root and ascending aorta are structurally normal, with no evidence of dilitation. Pulmonary Artery: The  pulmonary artery is not well seen. Venous: The inferior vena cava is normal in size with greater than 50% respiratory variability, suggesting right atrial pressure of 3 mmHg. IAS/Shunts: The interatrial septum was not well visualized.  LEFT VENTRICLE PLAX 2D LVIDd:         3.62 cm     Diastology LVIDs:         2.75 cm     LV e' lateral:   8.81 cm/s LV PW:         1.28 cm     LV E/e' lateral: 9.3 LV IVS:        1.21 cm     LV e' medial:    5.00 cm/s LVOT diam:     2.20 cm     LV E/e' medial:  16.4 LV SV:         79 LV SV Index:   42          2D Longitudinal Strain LVOT Area:     3.80 cm    2D Strain GLS Avg:     -15.8 %  LV Volumes (MOD) LV vol d, MOD A2C: 64.4 ml LV vol d, MOD A4C: 72.2 ml LV vol s, MOD A2C: 22.8 ml LV vol s, MOD A4C: 28.4 ml LV SV MOD A2C:  41.6 ml LV SV MOD A4C:     72.2 ml LV SV MOD BP:      44.3 ml RIGHT VENTRICLE            IVC RV Basal diam:  3.20 cm    IVC diam: 1.60 cm RV S prime:     9.03 cm/s TAPSE (M-mode): 1.8 cm LEFT ATRIUM             Index       RIGHT ATRIUM           Index LA diam:        3.00 cm 1.61 cm/m  RA Area:     11.20 cm LA Vol (A2C):   54.8 ml 29.44 ml/m RA Volume:   25.10 ml  13.48 ml/m LA Vol (A4C):   49.4 ml 26.54 ml/m LA Biplane Vol: 52.1 ml 27.99 ml/m  AORTIC VALVE                   PULMONIC VALVE AV Area (Vmax):    3.77 cm    PV Vmax:       1.11 m/s AV Area (Vmean):   3.59 cm    PV Peak grad:  4.9 mmHg AV Area (VTI):     4.37 cm AV Vmax:           118.00 cm/s AV Vmean:          81.000 cm/s AV VTI:            0.180 m AV Peak Grad:      5.6 mmHg AV Mean Grad:      3.0 mmHg LVOT Vmax:         117.00 cm/s LVOT Vmean:        76.500 cm/s LVOT VTI:          0.207 m LVOT/AV VTI ratio: 1.15  AORTA Ao Root diam: 3.30 cm Ao Asc diam:  2.90 cm Ao Arch diam: 2.8 cm MITRAL VALVE MV Area (PHT): 1.97 cm     SHUNTS MV Decel Time: 386 msec     Systemic VTI:  0.21 m MV E velocity: 82.00 cm/s   Systemic Diam: 2.20 cm MV A velocity: 139.00 cm/s MV E/A ratio:  0.59 Christopher  End MD Electronically signed by [name] MD Signature Date/Time: 03/01/2020/6:45:52 PM    Final     ASSESSMENT: Stage IIIa ER/PR positive, 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  PLAN:    1. 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  Case also discussed with surgery.  Given the size and stage of patient's tumor, she will benefit from neoadjuvant chemotherapy with Adriamycin, Cytoxan and Udenyca support followed by weekly Taxol.  MUGA scan from November 26, 2019 revealed an EF of approximately 61%. CT scan of the chest, abdomen, and pelvis on December 01, 2019 reviewed independently with no obvious evidence of metastatic disease.  Patient completed 4 cycles of Adriamycin and Cytoxan.  Proceed with cycle 8 of weekly Taxol today.  Return to clinic in 1 week for laboratory work and treatment only and then in 2 weeks for further evaluation and consideration of cycle 10.   2.  History of DCIS: Status post lumpectomy and XRT.  Patient reports she did not take any adjuvant hormonal therapy.  Given this history, have referred patient to genetic counseling for evaluation and testing. 3.  Anxiety/insomnia: Refractory to 1 mg Xanax at bedtime.  Can trial trazodone 50 mg at bedtime.  May increase to 100 mg if still unable to stay asleep.  I asked her to hold her Xanax if trying trazodone due to oversedation.  Prescription sent. 4.  Hypokalemia: Resolved.  5.  Hypotension: Have asked her to keep a diary of her blood pressures.  If her blood pressure systolically is below 90 I have asked her to hold.  6.  Depression: Increase Cymbalta to 60 mg daily. 7.  Diarrhea: Likely secondary to Taxol.  Can try Lomotil.  Prescription sent. 8.  Dehydration: I have asked given her history of electrolyte imbalance for her to drink plenty of water.  Electrolyte replacement packets sent home  with patient.  We will touch base next week  Patient expressed understanding and was in agreement with this plan. She also understands that She can call clinic at any time with any questions, concerns, or complaints.   Cancer Staging Primary cancer of upper outer quadrant of right female breast [facility]) Staging form: Breast, AJCC 8th Edition - Clinical stage from 11/26/2019: Stage IIIA (cT2, cN2a, cM0, G3, ER+, PR+, HER2-) - Signed by Lloyd Huger, MD on 11/26/2019   Jacquelin Hawking, NP   03/23/2020 9:05 AM

## 2020-03-28 NOTE — Progress Notes (Signed)
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  INTERVAL HISTORY: Patient returns to clinic today for further evaluation and consideration of cycle 9 of weekly Taxol.  She continues to have chronic weakness and fatigue and occasional dizziness.  She has insomnia and depressive symptoms, but her primary care physician is transitioning medications at this time.  She has no other neurologic complaints.  She denies any recent fevers or illnesses.  She has a fair appetite and she denies weight loss.  She has no chest pain, shortness of breath, cough, or hemoptysis.  She denies any vomiting, constipation, or diarrhea.  She has no urinary complaints.  Patient offers no further specific complaints today.  REVIEW OF SYSTEMS:   Review of Systems  Constitutional: Positive for malaise/fatigue. Negative for fever and weight loss.  HENT: Negative for sore throat.   Respiratory: Negative.  Negative for cough, hemoptysis and shortness of breath.   Cardiovascular: Negative for chest pain, palpitations and leg swelling.  Gastrointestinal: Positive for nausea. Negative for abdominal pain.  Genitourinary: Negative.  Negative for dysuria.  Musculoskeletal: Positive for back pain. Negative for myalgias.  Skin: Negative.  Negative for rash.  Neurological: Positive for dizziness and weakness. Negative for focal weakness and headaches.  Psychiatric/Behavioral: Positive for depression. The patient has insomnia. The  patient is not nervous/anxious.     As per HPI. Otherwise, a complete review of systems is negative.  PAST MEDICAL HISTORY: Past Medical History:  Diagnosis Date  . Brain tumor (benign) ([place])   . Breast cancer ([place]) 1999   Status post right breast lumpectomy and radiation  . COPD (chronic obstructive pulmonary disease) ([place])   . Depression   . History of chemotherapy 1999  . History of seizure disorder   . Hypertension   . Personal history of radiation therapy 1999  . Pneumonia     PAST SURGICAL HISTORY: Past Surgical History:  Procedure Laterality Date  . BRAIN SURGERY    . Brain tumor resection  1991  . BREAST BIOPSY Right 1999   Positive  . BREAST BIOPSY Right 11/10/2019   [place] bx 10:00 heart marker, path pending  . BREAST BIOPSY Right 11/10/2019   [place] bx axilla, hydromark 4, path pending  . BREAST BIOPSY Left 11/10/2019   [place] bx 2:00, venus marker, path pending  . PORTACATH PLACEMENT Left 11/30/2019   Procedure: INSERTION PORT-A-CATH;  Surgeon: Robert Bellow, MD;  Location: [facility] ORS;  Service: General;  Laterality: Left;  . Right breast lumpectomy    . TONSILLECTOMY    . TUBAL LIGATION      FAMILY HISTORY: Family History  Problem Relation Age of Onset  . Depression Mother   . CAD Father   . Hypertension Father   . Lung cancer Father   . Coronary artery disease Father 91  . Breast cancer Maternal Grandmother     ADVANCED DIRECTIVES (Y/N):  N  HEALTH MAINTENANCE: Social History   Tobacco Use  . Smoking status: Current Every Day Smoker    Packs/day: 0.50    Years: 40.00  Pack years: 20.00    Types: Cigarettes  . Smokeless tobacco: Never Used  Vaping Use  . Vaping Use: Never used  Substance Use Topics  . Alcohol use: No    Alcohol/week: 0.0 standard drinks  . Drug use: Yes    Frequency: 3.0 times per week    Types: Marijuana     Colonoscopy:  PAP:  Bone density:  Lipid panel:  Allergies  Allergen Reactions  . Imitrex [Sumatriptan]  Anaphylaxis  . Penicillins Rash    Did it involve swelling of the face/tongue/throat, SOB, or low BP? Unknown Did it involve sudden or severe rash/hives, skin peeling, or any reaction on the inside of your mouth or nose? Unknown Did you need to seek medical attention at a hospital or doctor's office? Unknown When did it last happen?? If all above answers are "NO", may proceed with cephalosporin use.     Current Outpatient Medications  Medication Sig Dispense Refill  . acetaminophen (TYLENOL) 500 MG tablet Take 500-1,000 mg by mouth every 6 (six) hours as needed (for pain.).    Marland Kitchen albuterol (VENTOLIN HFA) 108 (90 Base) MCG/ACT inhaler Inhale 1-2 puffs into the lungs every 6 (six) hours as needed for wheezing or shortness of breath.    . ALPRAZolam (XANAX) 0.5 MG tablet Take 2 tablets (1 mg total) by mouth at bedtime as needed for anxiety. 60 tablet 0  . amLODipine (NORVASC) 5 MG tablet Take 1 tablet (5 mg total) by mouth daily. 30 tablet 6  . diphenoxylate-atropine (LOMOTIL) 2.5-0.025 MG tablet Take 1 tablet by mouth 4 (four) times daily as needed for diarrhea or loose stools. 30 tablet 0  . DULoxetine (CYMBALTA) 20 MG capsule Take 3 capsules (60 mg total) by mouth daily. 90 capsule 2  . lidocaine-prilocaine (EMLA) cream Apply to affected area once 30 g 3  . Melatonin 5 MG TABS Take 1 tablet by mouth at bedtime and may repeat dose one time if needed.    . ondansetron (ZOFRAN) 8 MG tablet TAKE 1 TABLET BY MOUTH 2 TIMES DAILY AS NEEDED 60 tablet 2  . potassium chloride SA (KLOR-CON) 20 MEQ tablet Take 1 tablet (20 mEq total) by mouth 2 (two) times daily. 10 tablet 0  . prochlorperazine (COMPAZINE) 10 MG tablet TAKE 1 TABLET BY MOUTH EVERY 6 HOURS AS NEEDED (NAUSEA OR VOMITING) 60 tablet 2  . Pseudoeph-Doxylamine-DM-APAP (NYQUIL PO) Take 1-2 capsules by mouth at bedtime as needed (sleep.).     Marland Kitchen tetrahydrozoline 0.05 % ophthalmic solution Place 1-2 drops into both eyes 3 (three) times daily as  needed (dry/irritated eyes).    . traZODone (DESYREL) 50 MG tablet Take 1-2 tablets (50-100 mg total) by mouth at bedtime. 30 tablet 0  . Vilazodone HCl (VIIBRYD) 20 MG TABS Take 20 mg by mouth daily.     . traMADol (ULTRAM) 50 MG tablet Take 1 tablet (50 mg total) by mouth 2 (two) times daily. 60 tablet 0   No current facility-administered medications for this visit.   Facility-Administered Medications Ordered in Other Visits  Medication Dose Route Frequency Provider Last Rate Last Admin  . sodium chloride flush (NS) 0.9 % injection 10 mL  10 mL Intravenous PRN Lloyd Huger, MD      . sodium chloride flush (NS) 0.9 % injection 10 mL  10 mL Intravenous PRN Lloyd Huger, MD   10 mL at 01/13/20 0851    OBJECTIVE: Vitals:   03/30/20 1011  BP: (!) 115/92  Pulse:  88  Temp: 98.7 F (37.1 C)  SpO2: 100%     Body mass index is 24.21 kg/m.    ECOG FS:0 - Asymptomatic  General: Well-developed, well-nourished, no acute distress. Eyes: Pink conjunctiva, anicteric sclera. HEENT: Normocephalic, moist mucous membranes. Lungs: No audible wheezing or coughing. Heart: Regular rate and rhythm. Abdomen: Soft, nontender, no obvious distention. Musculoskeletal: No edema, cyanosis, or clubbing. Neuro: Alert, answering all questions appropriately. Cranial nerves grossly intact. Skin: No rashes or petechiae noted. Psych: Normal affect.   LAB RESULTS:  Lab Results  Component Value Date   NA 132 (L) 03/30/2020   K 3.2 (L) 03/30/2020   CL 100 03/30/2020   CO2 23 03/30/2020   GLUCOSE 91 03/30/2020   BUN 13 03/30/2020   CREATININE 0.91 03/30/2020   CALCIUM 9.1 03/30/2020   PROT 7.1 03/30/2020   ALBUMIN 4.2 03/30/2020   AST 22 03/30/2020   ALT 18 03/30/2020   ALKPHOS 77 03/30/2020   BILITOT 0.6 03/30/2020   GFRNONAA >60 03/30/2020   GFRAA >60 03/30/2020    Lab Results  Component Value Date   WBC 4.2 03/30/2020   NEUTROABS 3.0 03/30/2020   HGB 13.1 03/30/2020   HCT 36.3  03/30/2020   MCV 93.3 03/30/2020   PLT 232 03/30/2020     STUDIES: No results found.  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  PLAN:    1. 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  Case also discussed with surgery.  Given the size and stage of patient's tumor, she will benefit from neoadjuvant chemotherapy with Adriamycin, Cytoxan and Udenyca support followed by weekly Taxol.  MUGA scan from November 26, 2019 revealed an EF of approximately 61%. CT scan of the chest, abdomen, and pelvis on December 01, 2019 reviewed independently with no obvious evidence of metastatic disease.  Patient completed 4 cycles of Adriamycin and Cytoxan.  Proceed with cycle 9 of weekly Taxol today.  Patient also receive IV fluids with treatment.  Return to clinic in 1 week for further evaluation and consideration of cycle 10.   2.  History of DCIS: Status post lumpectomy and XRT.  Patient reports she did not take any adjuvant hormonal therapy.  Given this history, have referred patient to genetic counseling for evaluation and testing. 3.  Anxiety/insomnia: Continue 1 mg Xanax nightly as needed.  4.  Neutropenia: Resolved. 5.  Hypokalemia: Potassium 3.2 today.  Continue oral potassium supplementation as prescribed. 6.  Tooth abscess: Resolved.  Patient will require an appointment with dentistry or oral surgery at the conclusion of her treatments. 7.  Back pain: Patient was instructed to use ibuprofen sparingly.  Patient was given a refill of tramadol today.   8.  Myalgias/restless leg: Patient does not complain of this today. 9.  Palpitations/syncope: Appreciate cardiology input.  Follow-up with them as indicated. 10.  Depression: Chronic and unchanged.  Currently being managed by primary care. 11.  Dizziness: IV fluids with treatment as  above.  Patient expressed understanding and was in agreement with this plan. She also understands that She can call clinic at any time with any questions, concerns, or complaints.   Cancer Staging Primary cancer of upper outer quadrant of right female breast [facility]) Staging form: Breast, AJCC 8th Edition - Clinical stage from 11/26/2019: Stage IIIA (cT2, cN2a, cM0, G3, ER+, PR+, HER2-) - Signed by Lloyd Huger,  MD on 11/26/2019   Lloyd Huger, MD   03/31/2020 9:06 PM

## 2020-03-29 ENCOUNTER — Other Ambulatory Visit: Payer: Self-pay

## 2020-03-29 ENCOUNTER — Encounter: Payer: Self-pay | Admitting: Oncology

## 2020-03-29 DIAGNOSIS — C50411 Malignant neoplasm of upper-outer quadrant of right female breast: Secondary | ICD-10-CM

## 2020-03-29 NOTE — Progress Notes (Signed)
Called patient for pre assessment. She states she has been having some issues with allergies for past week. Reports some pain in both hips and rates pain at 6. Also reports her tongue is still sore despite using magic mouthwash.

## 2020-03-30 ENCOUNTER — Other Ambulatory Visit: Payer: Self-pay | Admitting: Oncology

## 2020-03-30 ENCOUNTER — Inpatient Hospital Stay: Payer: Medicaid Other

## 2020-03-30 ENCOUNTER — Encounter: Payer: Self-pay | Admitting: Oncology

## 2020-03-30 ENCOUNTER — Inpatient Hospital Stay (HOSPITAL_BASED_OUTPATIENT_CLINIC_OR_DEPARTMENT_OTHER): Payer: Medicaid Other | Admitting: Oncology

## 2020-03-30 ENCOUNTER — Other Ambulatory Visit: Payer: Self-pay

## 2020-03-30 VITALS — BP 115/92 | HR 88 | Temp 98.7°F | Ht 67.0 in | Wt 154.6 lb

## 2020-03-30 VITALS — BP 128/81 | HR 78 | Resp 20

## 2020-03-30 DIAGNOSIS — Z5111 Encounter for antineoplastic chemotherapy: Secondary | ICD-10-CM | POA: Diagnosis not present

## 2020-03-30 DIAGNOSIS — C50411 Malignant neoplasm of upper-outer quadrant of right female breast: Secondary | ICD-10-CM

## 2020-03-30 DIAGNOSIS — Z006 Encounter for examination for normal comparison and control in clinical research program: Secondary | ICD-10-CM | POA: Diagnosis not present

## 2020-03-30 DIAGNOSIS — Z95828 Presence of other vascular implants and grafts: Secondary | ICD-10-CM

## 2020-03-30 LAB — CBC WITH DIFFERENTIAL/PLATELET
Abs Immature Granulocytes: 0.02 10*3/uL (ref 0.00–0.07)
Basophils Absolute: 0 10*3/uL (ref 0.0–0.1)
Basophils Relative: 1 %
Eosinophils Absolute: 0.1 10*3/uL (ref 0.0–0.5)
Eosinophils Relative: 2 %
HCT: 36.3 % (ref 36.0–46.0)
Hemoglobin: 13.1 g/dL (ref 12.0–15.0)
Immature Granulocytes: 1 %
Lymphocytes Relative: 19 %
Lymphs Abs: 0.8 10*3/uL (ref 0.7–4.0)
MCH: 33.7 pg (ref 26.0–34.0)
MCHC: 36.1 g/dL — ABNORMAL HIGH (ref 30.0–36.0)
MCV: 93.3 fL (ref 80.0–100.0)
Monocytes Absolute: 0.3 10*3/uL (ref 0.1–1.0)
Monocytes Relative: 8 %
Neutro Abs: 3 10*3/uL (ref 1.7–7.7)
Neutrophils Relative %: 69 %
Platelets: 232 10*3/uL (ref 150–400)
RBC: 3.89 MIL/uL (ref 3.87–5.11)
RDW: 13.6 % (ref 11.5–15.5)
WBC: 4.2 10*3/uL (ref 4.0–10.5)
nRBC: 0 % (ref 0.0–0.2)

## 2020-03-30 LAB — COMPREHENSIVE METABOLIC PANEL
ALT: 18 U/L (ref 0–44)
AST: 22 U/L (ref 15–41)
Albumin: 4.2 g/dL (ref 3.5–5.0)
Alkaline Phosphatase: 77 U/L (ref 38–126)
Anion gap: 9 (ref 5–15)
BUN: 13 mg/dL (ref 6–20)
CO2: 23 mmol/L (ref 22–32)
Calcium: 9.1 mg/dL (ref 8.9–10.3)
Chloride: 100 mmol/L (ref 98–111)
Creatinine, Ser: 0.91 mg/dL (ref 0.44–1.00)
GFR calc Af Amer: >60 mL/min (ref >60)
GFR calc non Af Amer: >60 mL/min (ref >60)
Glucose, Bld: 91 mg/dL (ref 70–99)
Potassium: 3.2 mmol/L — ABNORMAL LOW (ref 3.5–5.1)
Sodium: 132 mmol/L — ABNORMAL LOW (ref 135–145)
Total Bilirubin: 0.6 mg/dL (ref 0.3–1.2)
Total Protein: 7.1 g/dL (ref 6.5–8.1)

## 2020-03-30 MED ORDER — HEPARIN SOD (PORK) LOCK FLUSH 100 UNIT/ML IV SOLN
INTRAVENOUS | Status: AC
  Filled 2020-03-30: qty 5

## 2020-03-30 MED ORDER — HEPARIN SOD (PORK) LOCK FLUSH 100 UNIT/ML IV SOLN
500.0000 [IU] | Freq: Once | INTRAVENOUS | Status: AC | PRN
  Administered 2020-03-30: 500 [IU]
  Filled 2020-03-30: qty 5

## 2020-03-30 MED ORDER — SODIUM CHLORIDE 0.9% FLUSH
10.0000 mL | INTRAVENOUS | Status: DC | PRN
  Administered 2020-03-30: 10 mL via INTRAVENOUS
  Filled 2020-03-30: qty 10

## 2020-03-30 MED ORDER — SODIUM CHLORIDE 0.9 % IV SOLN
80.0000 mg/m2 | Freq: Once | INTRAVENOUS | Status: AC
  Administered 2020-03-30: 150 mg via INTRAVENOUS
  Filled 2020-03-30: qty 25

## 2020-03-30 MED ORDER — SODIUM CHLORIDE 0.9 % IV SOLN
10.0000 mg | Freq: Once | INTRAVENOUS | Status: AC
  Administered 2020-03-30: 10 mg via INTRAVENOUS
  Filled 2020-03-30: qty 10

## 2020-03-30 MED ORDER — DIPHENHYDRAMINE HCL 50 MG/ML IJ SOLN
25.0000 mg | Freq: Once | INTRAMUSCULAR | Status: AC
  Administered 2020-03-30: 25 mg via INTRAVENOUS
  Filled 2020-03-30: qty 1

## 2020-03-30 MED ORDER — SODIUM CHLORIDE 0.9 % IV SOLN
Freq: Once | INTRAVENOUS | Status: AC
  Filled 2020-03-30: qty 250

## 2020-03-30 MED ORDER — SODIUM CHLORIDE 0.9 % IV SOLN
Freq: Once | INTRAVENOUS | Status: AC
  Administered 2020-03-30: 1000 mL via INTRAVENOUS
  Filled 2020-03-30: qty 250

## 2020-03-30 MED ORDER — FAMOTIDINE IN NACL 20-0.9 MG/50ML-% IV SOLN
20.0000 mg | Freq: Once | INTRAVENOUS | Status: AC
  Administered 2020-03-30: 20 mg via INTRAVENOUS
  Filled 2020-03-30: qty 50

## 2020-03-30 NOTE — Research (Addendum)
SWOG Y1749 Week 8 Assessment:  Patient in to clinic for her scheduled week 4 assessment unaccompanied this am. Patient states she is really fatigued today, but still able to perform ADL's and up during the day. She states that she does have some prickling sensations to her lower extremities but denies any numbness. She says it feels like pin pricks. Complaints of her nails beginning to turn a darker color, reassured her this is a normal side effect for her treatment.  RN accessed port in the lab without any difficulty and drew research central labs per protocol. Specimens were taken to the lab for processing immediately with the green and red top tubes on ice per protocol requirements.  The patient was escorted to the waiting area for her appointment with Dr. Grayland Ormond, research questionnaires were provided to the patient for her to complete prior to her visit. Dr. Grayland Ormond in to see patient and assess solicited AE's for which the patient complains of some prickling to her toes only. States this is intermittent, not constant and currently does not affect her ability to walk or perform ADL's. Dr. Grayland Ormond completed the neuropathy events form with grade 1 sensory neuropathy and the follow up physician assessment with a toxicity burden of 2. The neuropathy assessment was completed prior to her infusion with Yolande Jolly, RN assistance using her right arm and right leg ( dominant side ). The patient was able to detect sensation and sharpness with the neuropen monofilament and neurotip to all sites of her right foot except her right heel. The tuning fork was completed to her right arm and leg without any difficulty. All of the protocol required assessments were completed and the patient was assisted back to the waiting area for her scheduled infusion room appointment. Research nurse reminded the patient she will see her again for her next assessment at 12 week in August. The patient was encouraged to call if she has  any questions or concerns before her next visit. The patient voiced understanding of all procedures explained and is in agreement with them. Research RN spent approximately one hour with the patient to complete all of her protocol related activities.    RAILYNN BALLO [id number]  03/30/2020  Adverse Event Log  Study/Protocol: [id number] Cycle: Week 8  Event Grade Onset Date Resolved Date Drug Name Attribution Treatment Comments   Dysesthesia          Neuralgia          Paresthesia          Peripheral Motor Neuropathy          Peripheral Sensory Neuropathy  Grade 1  03/02/2020    Probable  None    Fatigue Grade 1 03/02/2020   Possible None   Jeral Fruit, RN, BSN, OCN 03/30/2020  1100 am

## 2020-03-30 NOTE — Progress Notes (Signed)
Today at follow up patient reports that her bp dropped last night to 64/55 and she felt very dizzy. She states she has not taken bp medication today. She feels this may be contributed to her not eating. She states she eats very little and it is very hard to get food down. She feels nauseous when trying to eat.

## 2020-03-31 ENCOUNTER — Other Ambulatory Visit: Payer: Self-pay | Admitting: Oncology

## 2020-03-31 MED ORDER — TRAMADOL HCL 50 MG PO TABS: 50.0000 mg | ORAL_TABLET | Freq: Two times a day (BID) | ORAL | 0 refills | Status: DC

## 2020-04-03 NOTE — Progress Notes (Signed)
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  INTERVAL HISTORY: Patient returns to clinic today for further evaluation and consideration of cycle 10 of weekly Taxol.  She continues to have chronic weakness and fatigue.  She has insomnia, but this is helped by taking 2 tabs of trazodone.  She has depressive symptoms, but her primary care physician is transitioning medications at this time.  She has no other neurologic complaints.  She denies any recent fevers or illnesses.  She has a fair appetite and she denies weight loss.  She has no chest pain, shortness of breath, cough, or hemoptysis.  She denies any vomiting, constipation, or diarrhea.  She admits to increased nausea.  She has no urinary complaints.  Patient offers no further specific complaints today.  REVIEW OF SYSTEMS:   Review of Systems  Constitutional: Positive for malaise/fatigue. Negative for fever and weight loss.  HENT: Negative for sore throat.   Respiratory: Negative.  Negative for cough, hemoptysis and shortness of breath.   Cardiovascular: Negative for chest pain, palpitations and leg swelling.  Gastrointestinal: Positive for nausea. Negative for abdominal pain.  Genitourinary: Negative.  Negative for dysuria.  Musculoskeletal: Positive for back pain. Negative for myalgias.  Skin: Negative.  Negative for rash.  Neurological: Positive for weakness. Negative for dizziness, focal weakness and headaches.   Psychiatric/Behavioral: Positive for depression. The patient has insomnia. The patient is not nervous/anxious.     As per HPI. Otherwise, a complete review of systems is negative.  PAST MEDICAL HISTORY: Past Medical History:  Diagnosis Date  . Brain tumor (benign) ([place])   . Breast cancer ([place]) 1999   Status post right breast lumpectomy and radiation  . COPD (chronic obstructive pulmonary disease) ([place])   . Depression   . History of chemotherapy 1999  . History of seizure disorder   . Hypertension   . Personal history of radiation therapy 1999  . Pneumonia     PAST SURGICAL HISTORY: Past Surgical History:  Procedure Laterality Date  . BRAIN SURGERY    . Brain tumor resection  1991  . BREAST BIOPSY Right 1999   Positive  . BREAST BIOPSY Right 11/10/2019   [place] bx 10:00 heart marker, path pending  . BREAST BIOPSY Right 11/10/2019   [place] bx axilla, hydromark 4, path pending  . BREAST BIOPSY Left 11/10/2019   [place] bx 2:00, venus marker, path pending  . PORTACATH PLACEMENT Left 11/30/2019   Procedure: INSERTION PORT-A-CATH;  Surgeon: Robert Bellow, MD;  Location: [facility] ORS;  Service: General;  Laterality: Left;  . Right breast lumpectomy    . TONSILLECTOMY    . TUBAL LIGATION      FAMILY HISTORY: Family History  Problem Relation Age of Onset  . Depression Mother   . CAD Father   . Hypertension Father   . Lung cancer Father   . Coronary artery disease Father 8  . Breast cancer Maternal Grandmother     ADVANCED DIRECTIVES (Y/N):  N  HEALTH MAINTENANCE: Social History   Tobacco Use  . Smoking status:  Current Every Day Smoker    Packs/day: 0.50    Years: 40.00    Pack years: 20.00    Types: Cigarettes  . Smokeless tobacco: Never Used  Vaping Use  . Vaping Use: Never used  Substance Use Topics  . Alcohol use: No    Alcohol/week: 0.0 standard drinks  . Drug use: Yes    Frequency: 3.0 times per week    Types: Marijuana     Colonoscopy:  PAP:  Bone  density:  Lipid panel:  Allergies  Allergen Reactions  . Imitrex [Sumatriptan] Anaphylaxis  . Penicillins Rash    Did it involve swelling of the face/tongue/throat, SOB, or low BP? Unknown Did it involve sudden or severe rash/hives, skin peeling, or any reaction on the inside of your mouth or nose? Unknown Did you need to seek medical attention at a hospital or doctor's office? Unknown When did it last happen?? If all above answers are "NO", may proceed with cephalosporin use.     Current Outpatient Medications  Medication Sig Dispense Refill  . acetaminophen (TYLENOL) 500 MG tablet Take 500-1,000 mg by mouth every 6 (six) hours as needed (for pain.).    Marland Kitchen albuterol (VENTOLIN HFA) 108 (90 Base) MCG/ACT inhaler Inhale 1-2 puffs into the lungs every 6 (six) hours as needed for wheezing or shortness of breath.    . ALPRAZolam (XANAX) 0.5 MG tablet Take 2 tablets (1 mg total) by mouth at bedtime as needed for anxiety. 60 tablet 0  . amLODipine (NORVASC) 5 MG tablet Take 1 tablet (5 mg total) by mouth daily. 30 tablet 6  . diphenoxylate-atropine (LOMOTIL) 2.5-0.025 MG tablet Take 1 tablet by mouth 4 (four) times daily as needed for diarrhea or loose stools. 30 tablet 0  . lidocaine-prilocaine (EMLA) cream Apply to affected area once 30 g 3  . Melatonin 5 MG TABS Take 1 tablet by mouth at bedtime and may repeat dose one time if needed.    . ondansetron (ZOFRAN) 8 MG tablet TAKE 1 TABLET BY MOUTH 2 TIMES DAILY AS NEEDED 60 tablet 2  . potassium chloride SA (KLOR-CON) 20 MEQ tablet Take 1 tablet (20 mEq total) by mouth 2 (two) times daily. 30 tablet 1  . prochlorperazine (COMPAZINE) 10 MG tablet TAKE 1 TABLET BY MOUTH EVERY 6 HOURS AS NEEDED (NAUSEA OR VOMITING) 60 tablet 2  . Pseudoeph-Doxylamine-DM-APAP (NYQUIL PO) Take 1-2 capsules by mouth at bedtime as needed (sleep.).     Marland Kitchen tetrahydrozoline 0.05 % ophthalmic solution Place 1-2 drops into both eyes 3 (three) times daily as needed  (dry/irritated eyes).    . traMADol (ULTRAM) 50 MG tablet Take 1 tablet (50 mg total) by mouth 2 (two) times daily. 60 tablet 0  . DULoxetine (CYMBALTA) 20 MG capsule Take 3 capsules (60 mg total) by mouth daily. (Patient not taking: Reported on 04/05/2020) 90 capsule 2  . Vilazodone HCl (VIIBRYD) 20 MG TABS Take 20 mg by mouth daily.  (Patient not taking: Reported on 04/05/2020)     No current facility-administered medications for this visit.   Facility-Administered Medications Ordered in Other Visits  Medication Dose Route Frequency Provider Last Rate Last Admin  . 0.9 %  sodium chloride infusion   Intravenous Once Lloyd Huger, MD 500 mL/hr at 04/06/20 1025 Rate Verify at 04/06/20 1025  . sodium chloride flush (NS) 0.9 % injection 10 mL  10 mL Intravenous PRN Lloyd Huger, MD      . sodium chloride flush (NS) 0.9 %  injection 10 mL  10 mL Intravenous PRN Lloyd Huger, MD   10 mL at 01/13/20 0851    OBJECTIVE: Vitals:   04/06/20 0906  BP: 117/83  Pulse: 91  Temp: 99.2 F (37.3 C)  SpO2: 99%     Body mass index is 24.42 kg/m.    ECOG FS:0 - Asymptomatic  General: Well-developed, well-nourished, no acute distress. Eyes: Pink conjunctiva, anicteric sclera. HEENT: Normocephalic, moist mucous membranes. Lungs: No audible wheezing or coughing. Heart: Regular rate and rhythm. Abdomen: Soft, nontender, no obvious distention. Musculoskeletal: No edema, cyanosis, or clubbing. Neuro: Alert, answering all questions appropriately. Cranial nerves grossly intact. Skin: No rashes or petechiae noted. Psych: Normal affect.   LAB RESULTS:  Lab Results  Component Value Date   NA 132 (L) 04/06/2020   K 2.8 (L) 04/06/2020   CL 98 04/06/2020   CO2 24 04/06/2020   GLUCOSE 123 (H) 04/06/2020   BUN <5 (L) 04/06/2020   CREATININE 0.83 04/06/2020   CALCIUM 8.7 (L) 04/06/2020   PROT 6.5 04/06/2020   ALBUMIN 3.5 04/06/2020   AST 39 04/06/2020   ALT 21 04/06/2020   ALKPHOS 85  04/06/2020   BILITOT 0.4 04/06/2020   GFRNONAA >60 04/06/2020   GFRAA >60 04/06/2020    Lab Results  Component Value Date   WBC 3.7 (L) 04/06/2020   NEUTROABS 2.6 04/06/2020   HGB 12.3 04/06/2020   HCT 35.0 (L) 04/06/2020   MCV 95.9 04/06/2020   PLT 215 04/06/2020     STUDIES: LONG TERM MONITOR (3-14 DAYS)  Result Date: 04/04/2020  The patient was monitored for 13 days, 15 hours. However, only 4 days, 4 hours could be analyzed due to artifact.  The predominant rhythm was sinus with rare PACs and PVCs.  No sustained arrhythmia or prolonged pause was observed.  Patient triggered event corresponds to artifact.  Predominately sinus rhythm with rare PACs and PVCs.  No significant arrhythmia noted, though monitoring was degraded by considerable artifact.    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  PLAN:    1. 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  Case also discussed with surgery.  Given the size and stage of patient's tumor, she will benefit from neoadjuvant chemotherapy with Adriamycin, Cytoxan and Udenyca support followed by weekly Taxol.  MUGA scan from November 26, 2019 revealed an EF of approximately 61%. CT scan of the chest, abdomen, and pelvis on December 01, 2019 reviewed independently with no obvious evidence of metastatic disease.  Patient completed 4 cycles of Adriamycin and Cytoxan.  Proceed with cycle 10 of weekly Taxol today.  Patient also receives 1 L of IV fluids with every treatment.  A referral was made back to surgery for evaluation.  Return to clinic in 1 week for further evaluation and consideration of cycle 11.    2.  History of DCIS: Status post lumpectomy and XRT.  Patient reports she did not take any adjuvant hormonal therapy.  Given this history, have referred patient to genetic  counseling for evaluation and testing. 3.  Insomnia: Patient was given a prescription for trazodone 100 mg nightly as needed. 4.  Neutropenia: Resolved. 5.  Hypokalemia: Potassium 2.8 today.  Patient admits to being noncompliant with oral potassium supplementation.  Proceed with 40 mEq IV potassium.   6.  Tooth abscess: Resolved.  Patient will require an appointment  with dentistry or oral surgery at the conclusion of her treatments. 7.  Back pain: Patient was instructed to use ibuprofen sparingly.  Continue tramadol as needed.  8.  Myalgias/restless leg: Patient does not complain of this today. 9.  Palpitations/syncope: Appreciate cardiology input.  Patient reports she has follow-up appointment tomorrow. 10.  Depression: Chronic and unchanged.  Currently being managed by primary care. 11.  Dizziness: IV fluids with treatments as above.  Patient expressed understanding and was in agreement with this plan. She also understands that She can call clinic at any time with any questions, concerns, or complaints.   Cancer Staging Primary cancer of upper outer quadrant of right female breast [facility]) Staging form: Breast, AJCC 8th Edition - Clinical stage from 11/26/2019: Stage IIIA (cT2, cN2a, cM0, G3, ER+, PR+, HER2-) - Signed by Lloyd Huger, MD on 11/26/2019   Lloyd Huger, MD   04/06/2020 2:23 PM

## 2020-04-04 ENCOUNTER — Telehealth: Payer: Self-pay | Admitting: Internal Medicine

## 2020-04-04 NOTE — Telephone Encounter (Signed)
Received records request, forwarded to nursing box

## 2020-04-05 ENCOUNTER — Encounter: Payer: Self-pay | Admitting: Oncology

## 2020-04-05 NOTE — Progress Notes (Signed)
Patient stated that her feet has swollen up really bad, this started on Sunday.

## 2020-04-06 ENCOUNTER — Other Ambulatory Visit: Payer: Self-pay | Admitting: *Deleted

## 2020-04-06 ENCOUNTER — Other Ambulatory Visit: Payer: Self-pay | Admitting: Oncology

## 2020-04-06 ENCOUNTER — Inpatient Hospital Stay: Payer: Medicaid Other | Attending: Oncology

## 2020-04-06 ENCOUNTER — Inpatient Hospital Stay: Payer: Medicaid Other

## 2020-04-06 ENCOUNTER — Inpatient Hospital Stay (HOSPITAL_BASED_OUTPATIENT_CLINIC_OR_DEPARTMENT_OTHER): Payer: Medicaid Other | Admitting: Oncology

## 2020-04-06 ENCOUNTER — Other Ambulatory Visit: Payer: Self-pay

## 2020-04-06 VITALS — BP 117/83 | HR 91 | Temp 99.2°F | Wt 155.9 lb

## 2020-04-06 DIAGNOSIS — M791 Myalgia, unspecified site: Secondary | ICD-10-CM | POA: Insufficient documentation

## 2020-04-06 DIAGNOSIS — R55 Syncope and collapse: Secondary | ICD-10-CM | POA: Insufficient documentation

## 2020-04-06 DIAGNOSIS — E876 Hypokalemia: Secondary | ICD-10-CM | POA: Diagnosis not present

## 2020-04-06 DIAGNOSIS — C50411 Malignant neoplasm of upper-outer quadrant of right female breast: Secondary | ICD-10-CM

## 2020-04-06 DIAGNOSIS — F17210 Nicotine dependence, cigarettes, uncomplicated: Secondary | ICD-10-CM | POA: Insufficient documentation

## 2020-04-06 DIAGNOSIS — R630 Anorexia: Secondary | ICD-10-CM | POA: Insufficient documentation

## 2020-04-06 DIAGNOSIS — Z800 Family history of malignant neoplasm of digestive organs: Secondary | ICD-10-CM | POA: Diagnosis not present

## 2020-04-06 DIAGNOSIS — Z5111 Encounter for antineoplastic chemotherapy: Secondary | ICD-10-CM | POA: Insufficient documentation

## 2020-04-06 DIAGNOSIS — R197 Diarrhea, unspecified: Secondary | ICD-10-CM | POA: Diagnosis not present

## 2020-04-06 DIAGNOSIS — Z923 Personal history of irradiation: Secondary | ICD-10-CM | POA: Diagnosis not present

## 2020-04-06 DIAGNOSIS — Z801 Family history of malignant neoplasm of trachea, bronchus and lung: Secondary | ICD-10-CM | POA: Insufficient documentation

## 2020-04-06 DIAGNOSIS — Z86000 Personal history of in-situ neoplasm of breast: Secondary | ICD-10-CM | POA: Diagnosis not present

## 2020-04-06 DIAGNOSIS — F329 Major depressive disorder, single episode, unspecified: Secondary | ICD-10-CM | POA: Insufficient documentation

## 2020-04-06 DIAGNOSIS — G629 Polyneuropathy, unspecified: Secondary | ICD-10-CM | POA: Insufficient documentation

## 2020-04-06 DIAGNOSIS — M549 Dorsalgia, unspecified: Secondary | ICD-10-CM | POA: Diagnosis not present

## 2020-04-06 DIAGNOSIS — R002 Palpitations: Secondary | ICD-10-CM | POA: Insufficient documentation

## 2020-04-06 DIAGNOSIS — Z170 Estrogen receptor positive status [ER+]: Secondary | ICD-10-CM | POA: Insufficient documentation

## 2020-04-06 DIAGNOSIS — R110 Nausea: Secondary | ICD-10-CM | POA: Insufficient documentation

## 2020-04-06 DIAGNOSIS — G2581 Restless legs syndrome: Secondary | ICD-10-CM | POA: Diagnosis not present

## 2020-04-06 LAB — COMPREHENSIVE METABOLIC PANEL
ALT: 21 U/L (ref 0–44)
AST: 39 U/L (ref 15–41)
Albumin: 3.5 g/dL (ref 3.5–5.0)
Alkaline Phosphatase: 85 U/L (ref 38–126)
Anion gap: 10 (ref 5–15)
BUN: <5 mg/dL — ABNORMAL LOW (ref 6–20)
CO2: 24 mmol/L (ref 22–32)
Calcium: 8.7 mg/dL — ABNORMAL LOW (ref 8.9–10.3)
Chloride: 98 mmol/L (ref 98–111)
Creatinine, Ser: 0.83 mg/dL (ref 0.44–1.00)
GFR calc Af Amer: >60 mL/min (ref >60)
GFR calc non Af Amer: >60 mL/min (ref >60)
Glucose, Bld: 123 mg/dL — ABNORMAL HIGH (ref 70–99)
Potassium: 2.8 mmol/L — ABNORMAL LOW (ref 3.5–5.1)
Sodium: 132 mmol/L — ABNORMAL LOW (ref 135–145)
Total Bilirubin: 0.4 mg/dL (ref 0.3–1.2)
Total Protein: 6.5 g/dL (ref 6.5–8.1)

## 2020-04-06 LAB — CBC WITH DIFFERENTIAL/PLATELET
Abs Immature Granulocytes: 0.01 10*3/uL (ref 0.00–0.07)
Basophils Absolute: 0 10*3/uL (ref 0.0–0.1)
Basophils Relative: 1 %
Eosinophils Absolute: 0 10*3/uL (ref 0.0–0.5)
Eosinophils Relative: 1 %
HCT: 35 % — ABNORMAL LOW (ref 36.0–46.0)
Hemoglobin: 12.3 g/dL (ref 12.0–15.0)
Immature Granulocytes: 0 %
Lymphocytes Relative: 16 %
Lymphs Abs: 0.6 10*3/uL — ABNORMAL LOW (ref 0.7–4.0)
MCH: 33.7 pg (ref 26.0–34.0)
MCHC: 35.1 g/dL (ref 30.0–36.0)
MCV: 95.9 fL (ref 80.0–100.0)
Monocytes Absolute: 0.5 10*3/uL (ref 0.1–1.0)
Monocytes Relative: 13 %
Neutro Abs: 2.6 10*3/uL (ref 1.7–7.7)
Neutrophils Relative %: 69 %
Platelets: 215 10*3/uL (ref 150–400)
RBC: 3.65 MIL/uL — ABNORMAL LOW (ref 3.87–5.11)
RDW: 13.9 % (ref 11.5–15.5)
WBC: 3.7 10*3/uL — ABNORMAL LOW (ref 4.0–10.5)
nRBC: 0 % (ref 0.0–0.2)

## 2020-04-06 MED ORDER — SODIUM CHLORIDE 0.9% FLUSH
10.0000 mL | Freq: Once | INTRAVENOUS | Status: AC
  Administered 2020-04-06: 10 mL
  Filled 2020-04-06: qty 10

## 2020-04-06 MED ORDER — HEPARIN SOD (PORK) LOCK FLUSH 100 UNIT/ML IV SOLN
500.0000 [IU] | Freq: Once | INTRAVENOUS | Status: AC | PRN
  Administered 2020-04-06: 500 [IU]
  Filled 2020-04-06: qty 5

## 2020-04-06 MED ORDER — HEPARIN SOD (PORK) LOCK FLUSH 100 UNIT/ML IV SOLN
INTRAVENOUS | Status: AC
  Filled 2020-04-06: qty 5

## 2020-04-06 MED ORDER — DIPHENHYDRAMINE HCL 50 MG/ML IJ SOLN
25.0000 mg | Freq: Once | INTRAMUSCULAR | Status: AC
  Administered 2020-04-06: 25 mg via INTRAVENOUS
  Filled 2020-04-06: qty 1

## 2020-04-06 MED ORDER — SODIUM CHLORIDE 0.9 % IV SOLN
80.0000 mg/m2 | Freq: Once | INTRAVENOUS | Status: AC
  Administered 2020-04-06: 150 mg via INTRAVENOUS
  Filled 2020-04-06: qty 25

## 2020-04-06 MED ORDER — SODIUM CHLORIDE 0.9 % IV SOLN
10.0000 mg | Freq: Once | INTRAVENOUS | Status: AC
  Administered 2020-04-06: 10 mg via INTRAVENOUS
  Filled 2020-04-06: qty 10

## 2020-04-06 MED ORDER — SODIUM CHLORIDE 0.9 % IV SOLN
Freq: Once | INTRAVENOUS | Status: AC
  Filled 2020-04-06: qty 1000

## 2020-04-06 MED ORDER — SODIUM CHLORIDE 0.9 % IV SOLN
Freq: Once | INTRAVENOUS | Status: AC
  Filled 2020-04-06: qty 250

## 2020-04-06 MED ORDER — POTASSIUM CHLORIDE CRYS ER 20 MEQ PO TBCR: 20.0000 meq | EXTENDED_RELEASE_TABLET | Freq: Two times a day (BID) | ORAL | 1 refills | Status: DC

## 2020-04-06 MED ORDER — FAMOTIDINE IN NACL 20-0.9 MG/50ML-% IV SOLN
20.0000 mg | Freq: Once | INTRAVENOUS | Status: AC
  Administered 2020-04-06: 20 mg via INTRAVENOUS
  Filled 2020-04-06: qty 50

## 2020-04-06 MED ORDER — SODIUM CHLORIDE 0.9 % IV SOLN
Freq: Once | INTRAVENOUS | Status: DC
  Filled 2020-04-06: qty 250

## 2020-04-06 MED ORDER — TRAZODONE HCL 50 MG PO TABS: 100.0000 mg | ORAL_TABLET | Freq: Every evening | ORAL | 0 refills | Status: DC | PRN

## 2020-04-07 ENCOUNTER — Encounter: Payer: Self-pay | Admitting: Family

## 2020-04-07 ENCOUNTER — Ambulatory Visit: Payer: Medicaid Other | Admitting: Family

## 2020-04-07 VITALS — BP 120/80 | HR 79 | Ht 67.0 in | Wt 154.6 lb

## 2020-04-07 DIAGNOSIS — R002 Palpitations: Secondary | ICD-10-CM | POA: Diagnosis not present

## 2020-04-07 DIAGNOSIS — I10 Essential (primary) hypertension: Secondary | ICD-10-CM

## 2020-04-07 DIAGNOSIS — R55 Syncope and collapse: Secondary | ICD-10-CM

## 2020-04-07 NOTE — Patient Instructions (Addendum)
Medication Instructions:  No medication changes today.   Continue Amlodipine 5mg  daily. Hold if your systolic blood pressure (the top number) is less than 100.  Call our office if you are having to hold this medication more than twice per week.   *If you need a refill on your cardiac medications before your next appointment, please call your pharmacy*   Lab Work: No lab work today.   Testing/Procedures: Your EKG today shows normal sinus rhythm which is a great result.   Your ZIO monitor showed normal sinus rhythm with an occasional early beat.  Your echocardiogram shows normal heart pumping function, which was mildly stiff which is common in those with history of high blood pressure. Your heart valves were all normal. No evidence of heart failure or any causes of your syncope.   Follow-Up: At [facility], you and your health needs are our priority.  As part of our continuing mission to provide you with exceptional heart care, we have created designated Provider Care Teams.  These Care Teams include your primary Cardiologist (physician) and Advanced Practice Providers (APPs -  Physician Assistants and Nurse Practitioners) who all work together to provide you with the care you need, when you need it.  We recommend signing up for the patient portal called "MyChart".  Sign up information is provided on this After Visit Summary.  MyChart is used to connect with patients for Virtual Visits (Telemedicine).  Patients are able to view lab/test results, encounter notes, upcoming appointments, etc.  Non-urgent messages can be sent to your provider as well.   To learn more about what you can do with MyChart, go to [email or web address].    Your next appointment:  In 2-3 months with Dr. Saunders Revel   Potassium Content of Foods  Potassium is a mineral found in many foods and drinks. It affects how the heart works, and helps keep fluids and minerals balanced in the body. The amount of potassium you  need each day depends on your age and any medical conditions you may have. Talk to your health care provider or dietitian about how much potassium you need. The following lists of foods provide the general serving size for foods and the approximate amount of potassium in each serving, listed in milligrams (mg). Actual values may vary depending on the product and how it is processed. High in potassium The following foods and beverages have 200 mg or more of potassium per serving:  Apricots (raw) - 2 have 200 mg of potassium.  Apricots (dry) - 5 have 200 mg of potassium.  Artichoke - 1 medium has 345 mg of potassium.  Avocado -  fruit has 245 mg of potassium.  Banana - 1 medium fruit has 425 mg of potassium.  [place] or baked beans (canned) -  cup has 280 mg of potassium.  White beans (canned) -  cup has 595 mg potassium.  Beef roast - 3 oz has 320 mg of potassium.  Ground beef - 3 oz has 270 mg of potassium.  Beets (raw or cooked) -  cup has 260 mg of potassium.  Bran muffin - 2 oz has 300 mg of potassium.  Broccoli (cooked) -  cup has 230 mg of potassium.  Brussels sprouts -  cup has 250 mg of potassium.  Cantaloupe -  cup has 215 mg of potassium.  Cereal, 100% bran -  cup has 200-400 mg of potassium.  Cheeseburger -1 single fast food burger has 225-400 mg of potassium.  Chicken -  3 oz has 220 mg of potassium.  Clams (canned) - 3 oz has 535 mg of potassium.  Crab - 3 oz has 225 mg of potassium.  Dates - 5 have 270 mg of potassium.  Dried beans and peas -  cup has 300-475 mg of potassium.  Figs (dried) - 2 have 260 mg of potassium.  Fish (halibut, tuna, cod, snapper) - 3 oz has 480 mg of potassium.  Fish (salmon, haddock, swordfish, perch) - 3 oz has 300 mg of potassium.  Fish (tuna, canned) - 3 oz has 200 mg of potassium.  [place] fries (fast food) - 3 oz has 470 mg of potassium.  Granola with fruit and nuts -  cup has 200 mg of potassium.  Grapefruit  juice -  cup has 200 mg of potassium.  Honeydew melon -  cup has 200 mg of potassium.  Kale (raw) - 1 cup has 300 mg of potassium.  Kiwi - 1 medium fruit has 240 mg of potassium.  Kohlrabi, rutabaga, parsnips -  cup has 280 mg of potassium.  Lentils -  cup has 365 mg of potassium.  Mango - 1 each has 325 mg of potassium.  Milk (nonfat, low-fat, whole, buttermilk) - 1 cup has 350-380 mg of potassium.  Milk (chocolate) - 1 cup has 420 mg of potassium  Molasses - 1 Tbsp has 295 mg of potassium.  Mushrooms -  cup has 280 mg of potassium.  Nectarine - 1 each has 275 mg of potassium.  Nuts (almonds, peanuts, hazelnuts, [place], cashew, mixed) - 1 oz has 200 mg of potassium.  Nuts (pistachios) - 1 oz has 295 mg of potassium.  Orange - 1 fruit has 240 mg of potassium.  Orange juice -  cup has 235 mg of potassium.  Papaya -  medium fruit has 390 mg of potassium.  Peanut butter (chunky) - 2 Tbsp has 240 mg of potassium.  Peanut butter (smooth) - 2 Tbsp has 210 mg of potassium.  Pear - 1 medium (200 mg) of potassium.  Pomegranate - 1 whole fruit has 400 mg of potassium.  Pomegranate juice -  cup has 215 mg of potassium.  Pork - 3 oz has 350 mg of potassium.  Potato chips (salted) - 1 oz has 465 mg of potassium.  Potato (baked with skin) - 1 medium has 925 mg of potassium.  Potato (boiled) -  cup has 255 mg of potassium.  Potato (Mashed) -  cup has 330 mg of potassium.  Prune juice -  cup has 370 mg of potassium.  Prunes - 5 have 305 mg of potassium.  Pudding (chocolate) -  cup has 230 mg of potassium.  Pumpkin (canned) -  cup has 250 mg of potassium.  Raisins (seedless) -  cup has 270 mg of potassium.  Seeds (sunflower or pumpkin) - 1 oz has 240 mg of potassium.  Soy milk - 1 cup has 300 mg of potassium.  Spinach (cooked) - 1/2 cup has 420 mg of potassium.  Spinach (canned) -  cup has 370 mg of potassium.  Sweet potato (baked with skin) - 1  medium has 450 mg of potassium.  Swiss chard -  cup has 480 mg of potassium.  Tomato or vegetable juice -  cup has 275 mg of potassium.  Tomato (sauce or puree) -  cup has 400-550 mg of potassium.  Tomato (raw) - 1 medium has 290 mg of potassium.  Tomato (canned) -  cup has 200-300  mg of potassium.  [place] - 3 oz has 250 mg of potassium.  Wheat germ - 1 oz has 250 mg of potassium.  Winter squash -  cup has 250 mg of potassium.  Yogurt (plain or fruited) - 6 oz has 260-435 mg of potassium.  Zucchini -  cup has 220 mg of potassium. Moderate in potassium The following foods and beverages have 50-200 mg of potassium per serving:  Apple - 1 fruit has 150 mg of potassium  Apple juice -  cup has 150 mg of potassium  Applesauce -  cup has 90 mg of potassium  Apricot nectar -  cup has 140 mg of potassium  Asparagus (small spears) -  cup has 155 mg of potassium  Asparagus (large spears) - 6 have 155 mg of potassium  Bagel (cinnamon raisin) - 1 four-inch bagel has 130 mg of potassium  Bagel (egg or plain) - 1 four- inch bagel has 70 mg of potassium  Beans (green) -  cup has 90 mg of potassium  Beans (yellow) -  cup has 190 mg of potassium  Beer, regular - 12 oz has 100 mg of potassium  Beets (canned) -  cup has 125 mg of potassium  Blackberries -  cup has 115 mg of potassium  Blueberries -  cup has 60 mg of potassium  Bread (whole wheat) - 1 slice has 70 mg of potassium  Broccoli (raw) -  cup has 145 mg of potassium  Cabbage -  cup has 150 mg of potassium  Carrots (cooked or raw) -  cup has 180 mg of potassium  Cauliflower (raw) -  cup has 150 mg of potassium  Celery (raw) -  cup has 155 mg of potassium  Cereal, bran flakes -  cup has 120-150 mg of potassium  Cheese (cottage) -  cup has 110 mg of potassium  Cherries - 10 have 150 mg of potassium  Chocolate - 1 oz bar has 165 mg of potassium  Coffee (brewed) - 6 oz has 90 mg of  potassium  Corn -  cup or 1 ear has 195 mg of potassium  Cucumbers -  cup has 80 mg of potassium  Egg - 1 large egg has 60 mg of potassium  Eggplant -  cup has 60 mg of potassium  Endive (raw) -  cup has 80 mg of potassium  English muffin - 1 has 65 mg of potassium  Fish (ocean perch) - 3 oz has 192 mg of potassium  Frankfurter, beef or pork - 1 has 75 mg of potassium  Fruit cocktail -  cup has 115 mg of potassium  Grape juice -  cup has 170 mg of potassium  Grapefruit -  fruit has 175 mg of potassium  Grapes -  cup has 155 mg of potassium  Greens: kale, turnip, collard -  cup has 110-150 mg of potassium  Ice cream or frozen yogurt (chocolate) -  cup has 175 mg of potassium  Ice cream or frozen yogurt (vanilla) -  cup has 120-150 mg of potassium  Lemons, limes - 1 each has 80 mg of potassium  Lettuce - 1 cup has 100 mg of potassium  Mixed vegetables -  cup has 150 mg of potassium  Mushrooms, raw -  cup has 110 mg of potassium  Nuts (walnuts, pecans, or macadamia) - 1 oz has 125 mg of potassium  Oatmeal -  cup has 80 mg of potassium  Okra -  cup has  110 mg of potassium  Onions -  cup has 120 mg of potassium  Peach - 1 has 185 mg of potassium  Peaches (canned) -  cup has 120 mg of potassium  Pears (canned) -  cup has 120 mg of potassium  Peas, green (frozen) -  cup has 90 mg of potassium  Peppers (Green) -  cup has 130 mg of potassium  Peppers (Red) -  cup has 160 mg of potassium  Pineapple juice -  cup has 165 mg of potassium  Pineapple (fresh or canned) -  cup has 100 mg of potassium  Plums - 1 has 105 mg of potassium  Pudding, vanilla -  cup has 150 mg of potassium  Raspberries -  cup has 90 mg of potassium  Rhubarb -  cup has 115 mg of potassium  Rice, wild -  cup has 80 mg of potassium  Shrimp - 3 oz has 155 mg of potassium  Spinach (raw) - 1 cup has 170 mg of potassium  Strawberries -  cup has 125 mg of  potassium  Summer squash -  cup has 175-200 mg of potassium  Swiss chard (raw) - 1 cup has 135 mg of potassium  Tangerines - 1 fruit has 140 mg of potassium  Tea, brewed - 6 oz has 65 mg of potassium  Turnips -  cup has 140 mg of potassium  Watermelon -  cup has 85 mg of potassium  Wine (Red, table) - 5 oz has 180 mg of potassium  Wine (White, table) - 5 oz 100 mg of potassium

## 2020-04-07 NOTE — Progress Notes (Signed)
Office Visit    Patient Name: Kelsey Russo Date of Encounter: 04/08/2020  Primary Care Provider:  Perrin Maltese, MD Primary Cardiologist:  [name], MD Electrophysiologist:  None   Chief Complaint    [name] is a 54 y.o. female with a hx of syncope, right breast cancer presently undergoing chemotherapy, HTN, COPD, seizure, depression presents today for follow up after ZIO and echoe.   Past Medical History    Past Medical History:  Diagnosis Date  . Brain tumor (benign) ([place])   . Breast cancer ([place]) 1999   Status post right breast lumpectomy and radiation  . COPD (chronic obstructive pulmonary disease) ([place])   . Depression   . History of chemotherapy 1999  . History of seizure disorder   . Hypertension   . Personal history of radiation therapy 1999  . Pneumonia    Past Surgical History:  Procedure Laterality Date  . BRAIN SURGERY    . Brain tumor resection  1991  . BREAST BIOPSY Right 1999   Positive  . BREAST BIOPSY Right 11/10/2019   [place] bx 10:00 heart marker, path pending  . BREAST BIOPSY Right 11/10/2019   [place] bx axilla, hydromark 4, path pending  . BREAST BIOPSY Left 11/10/2019   [place] bx 2:00, venus marker, path pending  . PORTACATH PLACEMENT Left 11/30/2019   Procedure: INSERTION PORT-A-CATH;  Surgeon: Robert Bellow, MD;  Location: [facility] ORS;  Service: General;  Laterality: Left;  . Right breast lumpectomy    . TONSILLECTOMY    . TUBAL LIGATION      Allergies  Allergies  Allergen Reactions  . Imitrex [Sumatriptan] Anaphylaxis  . Penicillins Rash    Did it involve swelling of the face/tongue/throat, SOB, or low BP? Unknown Did it involve sudden or severe rash/hives, skin peeling, or any reaction on the inside of your mouth or nose? Unknown Did you need to seek medical attention at a hospital or doctor's office? Unknown When did it last happen?? If all above answers are "NO", may proceed with cephalosporin use.     History of  Present Illness    Kelsey Russo is a 54 y.o. female with a hx of  syncope, right breast cancer presently undergoing chemotherapy, HTN, COPD, seizure, depression  last seen 02/22/20 by Dr. Saunders Revel.  Seen by Dr. Saunders Revel 02/22/20 after referral from Dr. Grayland Ormond for episode of fainting and syncope. She was recommended to hold her Amlodipine 10mg .  Echo 03/01/20 with LVEF 60-65%, no RWMA, mild LVH, gr1DD, trivial MR. Monitor 03/28/20 worn for 13 days but only 4 days of data due to artifact. Predominantly NSR with rare PVC/PAC and no significant arrhythmia nor pauses.   Since that time she called the office noting elevated BP and was started on Amlodipine at a lower dose of 5mg .   Reports intermittent edema in her bilateral ankles. Improves with elevating her lower extremities.   Spell last Tuesday with BP as low as 66/54 associated with weakness. Reports no recurrent fainting episodes. Does endorse she gets lower blood pressures on days after chemo treatments as she does not eat or drink well. She does check her blood pressure prior to taking her Amlodipine in the evening and holds her dose if her SBP is <100.   Reports no shortness of breath nor dyspnea on exertion. Reports no chest pain, pressure, or tightness. No edema, orthopnea, PND.  EKGs/Labs/Other Studies Reviewed:   The following studies were reviewed today:  Monitor 03/28/20  The patient was monitored for 13 days, 15 hours. However, only 4 days, 4 hours could be analyzed due to artifact.  The predominant rhythm was sinus with rare PACs and PVCs.  No sustained arrhythmia or prolonged pause was observed.  Patient triggered event corresponds to artifact.   Predominately sinus rhythm with rare PACs and PVCs.  No significant arrhythmia noted, though monitoring was degraded by considerable artifact.  Echo 03/01/20  1. Left ventricular ejection fraction, by estimation, is 60 to 65%. The  left ventricle has normal function. The left ventricle  has no regional  wall motion abnormalities. There is mild left ventricular hypertrophy.  Left ventricular diastolic parameters  are consistent with Grade I diastolic dysfunction (impaired relaxation).  Elevated left atrial pressure.   2. Right ventricular systolic function is normal. The right ventricular  size is normal.   3. The mitral valve is grossly normal. Trivial mitral valve  regurgitation. No evidence of mitral stenosis.   4. The aortic valve was not well visualized. Aortic valve regurgitation  is not visualized. No aortic stenosis is present.   5. Pulmonic valve regurgitation not well assessed.   6. The inferior vena cava is normal in size with greater than 50%  respiratory variability, suggesting right atrial pressure of 3 mmHg.    EKG:  EKG is ordered today.  The ekg ordered today demonstrates NSR 79 bpm with no acute ST/T wave changes.   Recent Labs: 04/06/2020: ALT 21; BUN <5; Creatinine, Ser 0.83; Hemoglobin 12.3; Platelets 215; Potassium 2.8; Sodium 132  Recent Lipid Panel No results found for: CHOL, TRIG, HDL, CHOLHDL, VLDL, LDLCALC, LDLDIRECT  Home Medications   Current Meds  Medication Sig  . acetaminophen (TYLENOL) 500 MG tablet Take 500-1,000 mg by mouth every 6 (six) hours as needed (for pain.).  Marland Kitchen albuterol (VENTOLIN HFA) 108 (90 Base) MCG/ACT inhaler Inhale 1-2 puffs into the lungs every 6 (six) hours as needed for wheezing or shortness of breath.  Marland Kitchen amLODipine (NORVASC) 5 MG tablet Take 1 tablet (5 mg total) by mouth daily.  . diphenoxylate-atropine (LOMOTIL) 2.5-0.025 MG tablet Take 1 tablet by mouth 4 (four) times daily as needed for diarrhea or loose stools.  . furosemide (LASIX) 20 MG tablet Take 20 mg by mouth daily.  Marland Kitchen lidocaine-prilocaine (EMLA) cream Apply to affected area once  . Melatonin 5 MG TABS Take 1 tablet by mouth at bedtime and may repeat dose one time if needed.  . ondansetron (ZOFRAN) 8 MG tablet TAKE 1 TABLET BY MOUTH 2 TIMES DAILY AS  NEEDED  . potassium chloride SA (KLOR-CON) 20 MEQ tablet Take 1 tablet (20 mEq total) by mouth 2 (two) times daily.  . prochlorperazine (COMPAZINE) 10 MG tablet TAKE 1 TABLET BY MOUTH EVERY 6 HOURS AS NEEDED (NAUSEA OR VOMITING)  . Pseudoeph-Doxylamine-DM-APAP (NYQUIL PO) Take 1-2 capsules by mouth at bedtime as needed (sleep.).   Marland Kitchen tetrahydrozoline 0.05 % ophthalmic solution Place 1-2 drops into both eyes 3 (three) times daily as needed (dry/irritated eyes).  . traMADol (ULTRAM) 50 MG tablet Take 1 tablet (50 mg total) by mouth 2 (two) times daily.  . traZODone (DESYREL) 50 MG tablet       Review of Systems      All other systems reviewed and are otherwise negative except as noted above.  Physical Exam    VS:  BP 120/80 (BP Location: Left Arm, Patient Position: Sitting, Cuff Size: Normal)   Pulse 79   Ht 5\' 7"  (1.702 m)  Wt 154 lb 9.6 oz (70.1 kg)   SpO2 96%   BMI 24.21 kg/m  , BMI Body mass index is 24.21 kg/m. GEN: Well nourished, well developed, in no acute distress. HEENT: normal. Neck: Supple, no JVD, carotid bruits, or masses. Cardiac: RRR, no murmurs, rubs, or gallops. No clubbing, cyanosis, edema.  Radials/DP/PT 2+ and equal bilaterally.  Respiratory:  Respirations regular and unlabored, clear to auscultation bilaterally. GI: Soft, nontender, nondistended, BS + x 4. MS: No deformity or atrophy. Skin: Warm and dry, no rash. Neuro:  Strength and sensation are intact. Psych: Normal affect.  Assessment & Plan    1. Syncope and palpitations - ZIO and echocardiogram without acute findings. No recurrent syncope. Reports previous episodes of hypotension since reduced dose of Amlodipine. Likely exacerbated by dehydration and eating poorly after her chemo treatment. She is excited to have only 2 more treatments. Increased fluid intake, slow position changes, compression stockings encouraged.   2. HTN - BP overall well controlled. Continue Amlodipine 5mg  daily. Hold for SBP  <100. If she is consistently having to hold doses - she will call our office and we can consider reduced dose to 2.5mg . however, in setting of mild LVH optimal control of BP is important.  3. Hypokalemia - Following with Dr. Grayland Ormond. Received IV potassium yesterday for K of 2.8. Given handout on oral sources of potassium. Encouraged her to take her potassium supplement as prescribed by Dr. Grayland Ormond.   Disposition: Follow up in 2 month(s) with Dr. Saunders Revel or APP   Loel Dubonnet, NP 04/08/2020, 10:46 PM

## 2020-04-08 NOTE — Progress Notes (Signed)
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  INTERVAL HISTORY: Patient returns to clinic today for further evaluation and consideration of cycle 11 of weekly Taxol.  She has had increased diarrhea, nausea, and vomiting this past week.  She also had a mild peripheral neuropathy that has now resolved. She has depressive symptoms, but her primary care physician is transitioning medications at this time.  She has no other neurologic complaints.  She denies any recent fevers or illnesses.  She has a poor appetite, but attributes this to the taste of food.  She has no chest pain, shortness of breath, cough, or hemoptysis.  She has no urinary complaints.  Patient offers no further specific complaints today.  REVIEW OF SYSTEMS:   Review of Systems  Constitutional: Positive for malaise/fatigue. Negative for fever and weight loss.  HENT: Negative for sore throat.   Respiratory: Negative.  Negative for cough, hemoptysis and shortness of breath.   Cardiovascular: Negative for chest pain, palpitations and leg swelling.  Gastrointestinal: Positive for diarrhea, nausea and vomiting. Negative for abdominal pain.  Genitourinary: Negative.  Negative for dysuria.  Musculoskeletal: Positive for back pain. Negative for myalgias.  Skin: Negative.  Negative for rash.  Neurological: Positive for weakness. Negative for dizziness, focal weakness and  headaches.  Psychiatric/Behavioral: Positive for depression. The patient has insomnia. The patient is not nervous/anxious.     As per HPI. Otherwise, a complete review of systems is negative.  PAST MEDICAL HISTORY: Past Medical History:  Diagnosis Date  . Brain tumor (benign) ([place])   . Breast cancer ([place]) 1999   Status post right breast lumpectomy and radiation  . COPD (chronic obstructive pulmonary disease) ([place])   . Depression   . History of chemotherapy 1999  . History of seizure disorder   . Hypertension   . Personal history of radiation therapy 1999  . Pneumonia     PAST SURGICAL HISTORY: Past Surgical History:  Procedure Laterality Date  . BRAIN SURGERY    . Brain tumor resection  1991  . BREAST BIOPSY Right 1999   Positive  . BREAST BIOPSY Right 11/10/2019   [place] bx 10:00 heart marker, path pending  . BREAST BIOPSY Right 11/10/2019   [place] bx axilla, hydromark 4, path pending  . BREAST BIOPSY Left 11/10/2019   [place] bx 2:00, venus marker, path pending  . PORTACATH PLACEMENT Left 11/30/2019   Procedure: INSERTION PORT-A-CATH;  Surgeon: Robert Bellow, MD;  Location: [facility] ORS;  Service: General;  Laterality: Left;  . Right breast lumpectomy    . TONSILLECTOMY    . TUBAL LIGATION      FAMILY HISTORY: Family History  Problem Relation Age of Onset  . Depression Mother   . CAD Father   . Hypertension Father   . Lung cancer Father   . Coronary artery disease Father 14  . Breast cancer Maternal Grandmother     ADVANCED DIRECTIVES (Y/N):  N  HEALTH MAINTENANCE: Social History   Tobacco Use  . Smoking status:  Current Every Day Smoker    Packs/day: 0.50    Years: 40.00    Pack years: 20.00    Types: Cigarettes  . Smokeless tobacco: Never Used  Vaping Use  . Vaping Use: Never used  Substance Use Topics  . Alcohol use: No    Alcohol/week: 0.0 standard drinks  . Drug use: Yes    Frequency: 3.0 times per week    Types: Marijuana      Colonoscopy:  PAP:  Bone density:  Lipid panel:  Allergies  Allergen Reactions  . Imitrex [Sumatriptan] Anaphylaxis  . Penicillins Rash    Did it involve swelling of the face/tongue/throat, SOB, or low BP? Unknown Did it involve sudden or severe rash/hives, skin peeling, or any reaction on the inside of your mouth or nose? Unknown Did you need to seek medical attention at a hospital or doctor's office? Unknown When did it last happen?? If all above answers are "NO", may proceed with cephalosporin use.     Current Outpatient Medications  Medication Sig Dispense Refill  . acetaminophen (TYLENOL) 500 MG tablet Take 500-1,000 mg by mouth every 6 (six) hours as needed (for pain.).    Marland Kitchen albuterol (VENTOLIN HFA) 108 (90 Base) MCG/ACT inhaler Inhale 1-2 puffs into the lungs every 6 (six) hours as needed for wheezing or shortness of breath.    Marland Kitchen amLODipine (NORVASC) 5 MG tablet Take 1 tablet (5 mg total) by mouth daily. 30 tablet 6  . diphenoxylate-atropine (LOMOTIL) 2.5-0.025 MG tablet Take 1 tablet by mouth 4 (four) times daily as needed for diarrhea or loose stools. 30 tablet 0  . lidocaine-prilocaine (EMLA) cream Apply to affected area once 30 g 3  . ondansetron (ZOFRAN) 8 MG tablet TAKE 1 TABLET BY MOUTH 2 TIMES DAILY AS NEEDED 60 tablet 2  . potassium chloride SA (KLOR-CON) 20 MEQ tablet Take 1 tablet (20 mEq total) by mouth 2 (two) times daily. 30 tablet 1  . prochlorperazine (COMPAZINE) 10 MG tablet TAKE 1 TABLET BY MOUTH EVERY 6 HOURS AS NEEDED (NAUSEA OR VOMITING) 60 tablet 2  . Pseudoeph-Doxylamine-DM-APAP (NYQUIL PO) Take 1-2 capsules by mouth at bedtime as needed (sleep.).     Marland Kitchen tetrahydrozoline 0.05 % ophthalmic solution Place 1-2 drops into both eyes 3 (three) times daily as needed (dry/irritated eyes).    . traMADol (ULTRAM) 50 MG tablet Take 1 tablet (50 mg total) by mouth 2 (two) times daily. 60 tablet 0  . traZODone (DESYREL) 50 MG tablet     . furosemide (LASIX) 20  MG tablet Take 20 mg by mouth daily. (Patient not taking: Reported on 04/13/2020)    . Melatonin 5 MG TABS Take 1 tablet by mouth at bedtime and may repeat dose one time if needed. (Patient not taking: Reported on 04/13/2020)     No current facility-administered medications for this visit.   Facility-Administered Medications Ordered in Other Visits  Medication Dose Route Frequency Provider Last Rate Last Admin  . 0.9 %  sodium chloride infusion   Intravenous Once Lloyd Huger, MD 500 mL/hr at 04/06/20 1025 Rate Verify at 04/06/20 1025  . heparin lock flush 100 unit/mL  500 Units Intravenous Once Lloyd Huger, MD      . sodium chloride flush (NS) 0.9 % injection 10 mL  10 mL Intravenous PRN Lloyd Huger, MD      . sodium chloride flush (NS) 0.9 % injection 10 mL  10 mL Intravenous PRN Grayland Ormond Kathlene November, MD  10 mL at 01/13/20 0851    OBJECTIVE: Vitals:   04/13/20 0907  BP: 125/90  Pulse: 92  Resp: 18  Temp: 98.6 F (37 C)  SpO2: 98%     Body mass index is 23.88 kg/m.    ECOG FS:1 - Symptomatic but completely ambulatory  General: Well-developed, well-nourished, no acute distress. Eyes: Pink conjunctiva, anicteric sclera. HEENT: Normocephalic, moist mucous membranes. Lungs: No audible wheezing or coughing. Heart: Regular rate and rhythm. Abdomen: Soft, nontender, no obvious distention. Musculoskeletal: No edema, cyanosis, or clubbing. Neuro: Alert, answering all questions appropriately. Cranial nerves grossly intact. Skin: No rashes or petechiae noted. Psych: Normal affect.    LAB RESULTS:  Lab Results  Component Value Date   NA 135 04/13/2020   K 3.3 (L) 04/13/2020   CL 102 04/13/2020   CO2 22 04/13/2020   GLUCOSE 104 (H) 04/13/2020   BUN 9 04/13/2020   CREATININE 0.91 04/13/2020   CALCIUM 9.0 04/13/2020   PROT 6.9 04/13/2020   ALBUMIN 4.0 04/13/2020   AST 19 04/13/2020   ALT 14 04/13/2020   ALKPHOS 81 04/13/2020   BILITOT 0.4 04/13/2020    GFRNONAA >60 04/13/2020   GFRAA >60 04/13/2020    Lab Results  Component Value Date   WBC 5.6 04/13/2020   NEUTROABS 4.0 04/13/2020   HGB 13.2 04/13/2020   HCT 38.3 04/13/2020   MCV 95.3 04/13/2020   PLT 294 04/13/2020     STUDIES: LONG TERM MONITOR (3-14 DAYS)  Result Date: 04/04/2020  The patient was monitored for 13 days, 15 hours. However, only 4 days, 4 hours could be analyzed due to artifact.  The predominant rhythm was sinus with rare PACs and PVCs.  No sustained arrhythmia or prolonged pause was observed.  Patient triggered event corresponds to artifact.  Predominately sinus rhythm with rare PACs and PVCs.  No significant arrhythmia noted, though monitoring was degraded by considerable artifact.    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  PLAN:    1. 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  Case also discussed with surgery.  Given the size and stage of patient's tumor, she will benefit from neoadjuvant chemotherapy with Adriamycin, Cytoxan and Udenyca support followed by weekly Taxol.  MUGA scan from November 26, 2019 revealed an EF of approximately 61%. CT scan of the chest, abdomen, and pelvis on December 01, 2019 reviewed independently with no obvious evidence of metastatic disease.  Patient completed 4 cycles of Adriamycin and Cytoxan.  Delay cycle 11 of weekly Taxol today secondary to increased nausea and diarrhea.  Patient will instead receive IV fluids and IV potassium.  She also reports that she has an appointment with surgery next week.  Return to clinic in 1 week for further evaluation and reconsideration of cycle 11.    2.  History of DCIS: Status post lumpectomy and XRT.  Patient reports she did not take any adjuvant hormonal therapy.  Given this history, have referred patient to genetic  counseling for evaluation and testing. 3.  Insomnia: Continue trazodone 100 mg nightly as needed. 4.  Neutropenia: Resolved. 5.  Hypokalemia: Potassium improved to 3.3 today.  Patient admits to being noncompliant with oral potassium supplementation.  Proceed with 40 mEq IV potassium. 6.  Tooth abscess: Resolved.  Patient will require an appointment with dentistry or oral surgery at the conclusion of her treatments.  7.  Back pain: Patient was instructed to use ibuprofen sparingly.  Continue tramadol as needed.   8.  Myalgias/restless leg: IV fluids and potassium as above. 9.  Palpitations/syncope: Appreciate cardiology input.  Continue follow-up with cardiology as scheduled.   10.  Depression: Chronic and unchanged.  Currently being managed by primary care. 11.  Diarrhea: Patient was given a refill of Imodium today.  IV fluids as above.  Patient expressed understanding and was in agreement with this plan. She also understands that She can call clinic at any time with any questions, concerns, or complaints.   Cancer Staging Primary cancer of upper outer quadrant of right female breast [facility]) Staging form: Breast, AJCC 8th Edition - Clinical stage from 11/26/2019: Stage IIIA (cT2, cN2a, cM0, G3, ER+, PR+, HER2-) - Signed by Lloyd Huger, MD on 11/26/2019   Lloyd Huger, MD   04/13/2020 9:37 AM

## 2020-04-13 ENCOUNTER — Encounter: Payer: Self-pay | Admitting: Oncology

## 2020-04-13 ENCOUNTER — Inpatient Hospital Stay: Payer: Medicaid Other

## 2020-04-13 ENCOUNTER — Other Ambulatory Visit: Payer: Self-pay

## 2020-04-13 ENCOUNTER — Inpatient Hospital Stay (HOSPITAL_BASED_OUTPATIENT_CLINIC_OR_DEPARTMENT_OTHER): Payer: Medicaid Other | Admitting: Oncology

## 2020-04-13 VITALS — BP 125/90 | HR 92 | Temp 98.6°F | Resp 18 | Wt 152.5 lb

## 2020-04-13 DIAGNOSIS — C50411 Malignant neoplasm of upper-outer quadrant of right female breast: Secondary | ICD-10-CM | POA: Diagnosis not present

## 2020-04-13 DIAGNOSIS — Z5111 Encounter for antineoplastic chemotherapy: Secondary | ICD-10-CM | POA: Diagnosis not present

## 2020-04-13 DIAGNOSIS — E876 Hypokalemia: Secondary | ICD-10-CM

## 2020-04-13 LAB — COMPREHENSIVE METABOLIC PANEL
ALT: 14 U/L (ref 0–44)
AST: 19 U/L (ref 15–41)
Albumin: 4 g/dL (ref 3.5–5.0)
Alkaline Phosphatase: 81 U/L (ref 38–126)
Anion gap: 11 (ref 5–15)
BUN: 9 mg/dL (ref 6–20)
CO2: 22 mmol/L (ref 22–32)
Calcium: 9 mg/dL (ref 8.9–10.3)
Chloride: 102 mmol/L (ref 98–111)
Creatinine, Ser: 0.91 mg/dL (ref 0.44–1.00)
GFR calc Af Amer: >60 mL/min (ref >60)
GFR calc non Af Amer: >60 mL/min (ref >60)
Glucose, Bld: 104 mg/dL — ABNORMAL HIGH (ref 70–99)
Potassium: 3.3 mmol/L — ABNORMAL LOW (ref 3.5–5.1)
Sodium: 135 mmol/L (ref 135–145)
Total Bilirubin: 0.4 mg/dL (ref 0.3–1.2)
Total Protein: 6.9 g/dL (ref 6.5–8.1)

## 2020-04-13 LAB — CBC WITH DIFFERENTIAL/PLATELET
Abs Immature Granulocytes: 0.04 10*3/uL (ref 0.00–0.07)
Basophils Absolute: 0 10*3/uL (ref 0.0–0.1)
Basophils Relative: 1 %
Eosinophils Absolute: 0.1 10*3/uL (ref 0.0–0.5)
Eosinophils Relative: 2 %
HCT: 38.3 % (ref 36.0–46.0)
Hemoglobin: 13.2 g/dL (ref 12.0–15.0)
Immature Granulocytes: 1 %
Lymphocytes Relative: 18 %
Lymphs Abs: 1 10*3/uL (ref 0.7–4.0)
MCH: 32.8 pg (ref 26.0–34.0)
MCHC: 34.5 g/dL (ref 30.0–36.0)
MCV: 95.3 fL (ref 80.0–100.0)
Monocytes Absolute: 0.4 10*3/uL (ref 0.1–1.0)
Monocytes Relative: 7 %
Neutro Abs: 4 10*3/uL (ref 1.7–7.7)
Neutrophils Relative %: 71 %
Platelets: 294 10*3/uL (ref 150–400)
RBC: 4.02 MIL/uL (ref 3.87–5.11)
RDW: 13.5 % (ref 11.5–15.5)
WBC: 5.6 10*3/uL (ref 4.0–10.5)
nRBC: 0 % (ref 0.0–0.2)

## 2020-04-13 MED ORDER — HEPARIN SOD (PORK) LOCK FLUSH 100 UNIT/ML IV SOLN
INTRAVENOUS | Status: AC
  Filled 2020-04-13: qty 5

## 2020-04-13 MED ORDER — SODIUM CHLORIDE 0.9% FLUSH
10.0000 mL | Freq: Once | INTRAVENOUS | Status: AC
  Administered 2020-04-13: 10 mL via INTRAVENOUS
  Filled 2020-04-13: qty 10

## 2020-04-13 MED ORDER — ONDANSETRON HCL 4 MG/2ML IJ SOLN
8.0000 mg | Freq: Once | INTRAMUSCULAR | Status: AC
  Administered 2020-04-13: 8 mg via INTRAVENOUS
  Filled 2020-04-13: qty 4

## 2020-04-13 MED ORDER — SODIUM CHLORIDE 0.9 % IV SOLN: 40.0000 meq | Freq: Once | INTRAVENOUS | Status: DC

## 2020-04-13 MED ORDER — SODIUM CHLORIDE 0.9 % IV SOLN
Freq: Once | INTRAVENOUS | Status: AC
  Filled 2020-04-13: qty 1000

## 2020-04-13 MED ORDER — SODIUM CHLORIDE 0.9 % IV SOLN
Freq: Once | INTRAVENOUS | Status: AC
  Filled 2020-04-13: qty 250

## 2020-04-13 MED ORDER — DIPHENOXYLATE-ATROPINE 2.5-0.025 MG PO TABS: 1.0000 | ORAL_TABLET | Freq: Four times a day (QID) | ORAL | 0 refills | Status: DC | PRN

## 2020-04-13 MED ORDER — HEPARIN SOD (PORK) LOCK FLUSH 100 UNIT/ML IV SOLN
500.0000 [IU] | Freq: Once | INTRAVENOUS | Status: AC
  Administered 2020-04-13: 500 [IU] via INTRAVENOUS
  Filled 2020-04-13: qty 5

## 2020-04-13 MED ORDER — SODIUM CHLORIDE 0.9 % IV SOLN
Freq: Once | INTRAVENOUS | Status: DC
  Filled 2020-04-13: qty 250

## 2020-04-13 NOTE — Progress Notes (Signed)
Patient here for oncology follow-up appointment, expresses complaints or concerns of foot pain/numbness, extreme N/V/D and weakness/ fatigue.

## 2020-04-15 NOTE — Progress Notes (Signed)
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  INTERVAL HISTORY: Patient returns to clinic today for further evaluation and reconsideration of cycle 11 of weekly Taxol.  Although diarrhea has improved, her performance status is still decreased and she has a worsening peripheral neuropathy.  She continues to have nausea had a poor appetite.  She has depressive symptoms, but her primary care physician is transitioning medications at this time.  She has no other neurologic complaints.  She denies any recent fevers or illnesses. She has no chest pain, shortness of breath, cough, or hemoptysis.  She has no urinary complaints.  Patient offers no further specific complaints today.  REVIEW OF SYSTEMS:   Review of Systems  Constitutional: Positive for malaise/fatigue. Negative for fever and weight loss.  HENT: Negative for sore throat.   Respiratory: Negative.  Negative for cough, hemoptysis and shortness of breath.   Cardiovascular: Negative for chest pain, palpitations and leg swelling.  Gastrointestinal: Positive for diarrhea and nausea. Negative for abdominal pain and vomiting.  Genitourinary: Negative.  Negative for dysuria.  Musculoskeletal: Positive for back pain. Negative for myalgias.  Skin: Negative.  Negative for rash.  Neurological: Positive for weakness. Negative for dizziness, focal weakness and headaches.   Psychiatric/Behavioral: Positive for depression. The patient has insomnia. The patient is not nervous/anxious.     As per HPI. Otherwise, a complete review of systems is negative.  PAST MEDICAL HISTORY: Past Medical History:  Diagnosis Date  . Brain tumor (benign) (HCC)   . Breast cancer (HCC) 1999   Status post right breast lumpectomy and radiation  . COPD (chronic obstructive pulmonary disease) (HCC)   . Depression   . History of chemotherapy 1999  . History of seizure disorder   . Hypertension   . Personal history of radiation therapy 1999  . Pneumonia     PAST SURGICAL HISTORY: Past Surgical History:  Procedure Laterality Date  . BRAIN SURGERY    . Brain tumor resection  1991  . BREAST BIOPSY Right 1999   Positive  . BREAST BIOPSY Right 11/10/2019   [place] bx 10:00 heart marker, path pending  . BREAST BIOPSY Right 11/10/2019   [place] bx axilla, hydromark 4, path pending  . BREAST BIOPSY Left 11/10/2019   [place] bx 2:00, venus marker, path pending  . PORTACATH PLACEMENT Left 11/30/2019   Procedure: INSERTION PORT-A-CATH;  Surgeon: Earline Mayotte, MD;  Location: [facility] ORS;  Service: General;  Laterality: Left;  . Right breast lumpectomy    . TONSILLECTOMY    . TUBAL LIGATION      FAMILY HISTORY: Family History  Problem Relation Age of Onset  . Depression Mother   . CAD Father   . Hypertension Father   . Lung cancer Father   . Coronary artery disease Father 62  . Breast cancer Maternal Grandmother     ADVANCED DIRECTIVES (Y/N):  N  HEALTH MAINTENANCE: Social History   Tobacco Use  . Smoking status: Current Every Day Smoker    Packs/day: 0.50  Years: 40.00    Pack years: 20.00    Types: Cigarettes  . Smokeless tobacco: Never Used  Vaping Use  . Vaping Use: Never used  Substance Use Topics  . Alcohol use: No    Alcohol/week: 0.0 standard drinks  . Drug use: Yes    Frequency: 3.0 times per week    Types: Marijuana     Colonoscopy:  PAP:  Bone  density:  Lipid panel:  Allergies  Allergen Reactions  . Imitrex [Sumatriptan] Anaphylaxis  . Penicillins Rash    Did it involve swelling of the face/tongue/throat, SOB, or low BP? Unknown Did it involve sudden or severe rash/hives, skin peeling, or any reaction on the inside of your mouth or nose? Unknown Did you need to seek medical attention at a hospital or doctor's office? Unknown When did it last happen?? If all above answers are "NO", may proceed with cephalosporin use.     Current Outpatient Medications  Medication Sig Dispense Refill  . acetaminophen (TYLENOL) 500 MG tablet Take 500-1,000 mg by mouth every 6 (six) hours as needed (for pain.).    Marland Kitchen albuterol (VENTOLIN HFA) 108 (90 Base) MCG/ACT inhaler Inhale 1-2 puffs into the lungs every 6 (six) hours as needed for wheezing or shortness of breath.    Marland Kitchen amLODipine (NORVASC) 5 MG tablet Take 1 tablet (5 mg total) by mouth daily. 30 tablet 6  . diphenoxylate-atropine (LOMOTIL) 2.5-0.025 MG tablet Take 1 tablet by mouth 4 (four) times daily as needed for diarrhea or loose stools. 30 tablet 0  . lidocaine-prilocaine (EMLA) cream Apply to affected area once 30 g 3  . ondansetron (ZOFRAN) 8 MG tablet TAKE 1 TABLET BY MOUTH 2 TIMES DAILY AS NEEDED 60 tablet 2  . potassium chloride SA (KLOR-CON) 20 MEQ tablet Take 1 tablet (20 mEq total) by mouth 2 (two) times daily. 30 tablet 1  . prochlorperazine (COMPAZINE) 10 MG tablet TAKE 1 TABLET BY MOUTH EVERY 6 HOURS AS NEEDED (NAUSEA OR VOMITING) 60 tablet 2  . Pseudoeph-Doxylamine-DM-APAP (NYQUIL PO) Take 1-2 capsules by mouth at bedtime as needed (sleep.).     Marland Kitchen tetrahydrozoline 0.05 % ophthalmic solution Place 1-2 drops into both eyes 3 (three) times daily as needed (dry/irritated eyes).    . traMADol (ULTRAM) 50 MG tablet Take 1 tablet (50 mg total) by mouth 2 (two) times daily. 60 tablet 0  . traZODone (DESYREL) 50 MG tablet      No current facility-administered medications for this  visit.   Facility-Administered Medications Ordered in Other Visits  Medication Dose Route Frequency Provider Last Rate Last Admin  . 0.9 %  sodium chloride infusion   Intravenous Once Jeralyn Ruths, MD 500 mL/hr at 04/06/20 1025 Rate Verify at 04/06/20 1025  . sodium chloride flush (NS) 0.9 % injection 10 mL  10 mL Intravenous PRN Jeralyn Ruths, MD      . sodium chloride flush (NS) 0.9 % injection 10 mL  10 mL Intravenous PRN Jeralyn Ruths, MD   10 mL at 01/13/20 0851    OBJECTIVE: Vitals:   04/20/20 0913  BP: (!) 122/101  Pulse: (!) 101  Resp: 16  Temp: 99.2 F (37.3 C)  SpO2: 98%     Body mass index is 23.63 kg/m.    ECOG FS:1 - Symptomatic but completely ambulatory  General: Well-developed, well-nourished, no acute distress. Eyes: Pink conjunctiva, anicteric sclera. HEENT: Normocephalic, moist mucous membranes. Lungs: No audible wheezing or coughing. Heart: Regular rate and rhythm.  Abdomen: Soft, nontender, no obvious distention. Musculoskeletal: No edema, cyanosis, or clubbing. Neuro: Alert, answering all questions appropriately. Cranial nerves grossly intact. Skin: No rashes or petechiae noted. Psych: Normal affect.   LAB RESULTS:  Lab Results  Component Value Date   NA 137 04/20/2020   K 3.3 (L) 04/20/2020   CL 103 04/20/2020   CO2 24 04/20/2020   GLUCOSE 102 (H) 04/20/2020   BUN <5 (L) 04/20/2020   CREATININE 0.78 04/20/2020   CALCIUM 9.1 04/20/2020   PROT 7.0 04/20/2020   ALBUMIN 3.9 04/20/2020   AST 19 04/20/2020   ALT 11 04/20/2020   ALKPHOS 91 04/20/2020   BILITOT 0.5 04/20/2020   GFRNONAA >60 04/20/2020   GFRAA >60 04/20/2020    Lab Results  Component Value Date   WBC 7.5 04/20/2020   NEUTROABS 5.6 04/20/2020   HGB 13.6 04/20/2020   HCT 39.1 04/20/2020   MCV 94.4 04/20/2020   PLT 259 04/20/2020     STUDIES: LONG TERM MONITOR (3-14 DAYS)  Result Date: 04/04/2020  The patient was monitored for 13 days, 15 hours. However,  only 4 days, 4 hours could be analyzed due to artifact.  The predominant rhythm was sinus with rare PACs and PVCs.  No sustained arrhythmia or prolonged pause was observed.  Patient triggered event corresponds to artifact.  Predominately sinus rhythm with rare PACs and PVCs.  No significant arrhythmia noted, though monitoring was degraded by considerable artifact.    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  PLAN:    1. 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  Case also discussed with surgery.  Given the size and stage of patient's tumor, she will benefit from neoadjuvant chemotherapy with Adriamycin, Cytoxan and Udenyca support followed by weekly Taxol.  MUGA scan from November 26, 2019 revealed an EF of approximately 61%. CT scan of the chest, abdomen, and pelvis on December 01, 2019 reviewed independently with no obvious evidence of metastatic disease.  Patient completed 4 cycles of Adriamycin and Cytoxan.  She only received 10 infusions of weekly Taxol her last one occurring on April 06, 2020.  Given her persistently decreased performance status and worsening neuropathy, treatment has been discontinued.  Patient has an appointment with surgery on May 10, 2020.  Return to clinic in 6 weeks, postoperatively, for further evaluation and additional treatment planning.    2.  History of DCIS: Status post lumpectomy and XRT.  Patient reports she did not take any adjuvant hormonal therapy.  Given this history, have referred patient to genetic counseling for evaluation and testing. 3.  Insomnia: Patient was given a prescription for Xanax today. 4.  Neutropenia: Resolved. 5.  Hypokalemia: Chronic and unchanged.  Patient admits to being noncompliant with oral potassium supplementation. 6.  Tooth abscess: Resolved.  Patient will  require an appointment with dentistry or oral surgery at the conclusion of her treatments. 7.  Back pain: Patient was instructed to use ibuprofen sparingly.  Continue tramadol as needed.   8.  Myalgias/restless leg: Patient does not complain of this today. 9.  Palpitations/syncope: Appreciate cardiology input.  Continue follow-up with cardiology as scheduled.   10.  Depression: Chronic and unchanged.  Currently being managed by primary care. 11.  Diarrhea: Improved.  Continue Imodium as needed.  Patient expressed understanding and was in agreement with this plan. She also understands that She can  call clinic at any time with any questions, concerns, or complaints.   Cancer Staging Primary cancer of upper outer quadrant of right female breast [facility]) Staging form: Breast, AJCC 8th Edition - Clinical stage from 11/26/2019: Stage IIIA (cT2, cN2a, cM0, G3, ER+, PR+, HER2-) - Signed by Lloyd Huger, MD on 11/26/2019   Lloyd Huger, MD   04/20/2020 10:43 AM

## 2020-04-19 ENCOUNTER — Encounter: Payer: Self-pay | Admitting: Oncology

## 2020-04-19 NOTE — Progress Notes (Signed)
Patient was called for pre assessment. She states that she is having some aches in legs. Would like to discuss getting back on alprazolam. States she is having trouble sleeping at night due to anxiety. She also states she is still having some congestion with cough and may need to start allergy medication. Would like to discuss if she is eligible for 3rd covid shot.

## 2020-04-20 ENCOUNTER — Inpatient Hospital Stay: Payer: Medicaid Other

## 2020-04-20 ENCOUNTER — Inpatient Hospital Stay (HOSPITAL_BASED_OUTPATIENT_CLINIC_OR_DEPARTMENT_OTHER): Payer: Medicaid Other | Admitting: Oncology

## 2020-04-20 ENCOUNTER — Other Ambulatory Visit: Payer: Self-pay

## 2020-04-20 VITALS — BP 122/101 | HR 101 | Temp 99.2°F | Resp 16 | Wt 150.9 lb

## 2020-04-20 DIAGNOSIS — C50411 Malignant neoplasm of upper-outer quadrant of right female breast: Secondary | ICD-10-CM | POA: Diagnosis not present

## 2020-04-20 DIAGNOSIS — Z5111 Encounter for antineoplastic chemotherapy: Secondary | ICD-10-CM | POA: Diagnosis not present

## 2020-04-20 DIAGNOSIS — Z95828 Presence of other vascular implants and grafts: Secondary | ICD-10-CM

## 2020-04-20 LAB — COMPREHENSIVE METABOLIC PANEL
ALT: 11 U/L (ref 0–44)
AST: 19 U/L (ref 15–41)
Albumin: 3.9 g/dL (ref 3.5–5.0)
Alkaline Phosphatase: 91 U/L (ref 38–126)
Anion gap: 10 (ref 5–15)
BUN: <5 mg/dL — ABNORMAL LOW (ref 6–20)
CO2: 24 mmol/L (ref 22–32)
Calcium: 9.1 mg/dL (ref 8.9–10.3)
Chloride: 103 mmol/L (ref 98–111)
Creatinine, Ser: 0.78 mg/dL (ref 0.44–1.00)
GFR calc Af Amer: >60 mL/min (ref >60)
GFR calc non Af Amer: >60 mL/min (ref >60)
Glucose, Bld: 102 mg/dL — ABNORMAL HIGH (ref 70–99)
Potassium: 3.3 mmol/L — ABNORMAL LOW (ref 3.5–5.1)
Sodium: 137 mmol/L (ref 135–145)
Total Bilirubin: 0.5 mg/dL (ref 0.3–1.2)
Total Protein: 7 g/dL (ref 6.5–8.1)

## 2020-04-20 LAB — CBC WITH DIFFERENTIAL/PLATELET
Abs Immature Granulocytes: 0.02 10*3/uL (ref 0.00–0.07)
Basophils Absolute: 0.1 10*3/uL (ref 0.0–0.1)
Basophils Relative: 1 %
Eosinophils Absolute: 0.1 10*3/uL (ref 0.0–0.5)
Eosinophils Relative: 1 %
HCT: 39.1 % (ref 36.0–46.0)
Hemoglobin: 13.6 g/dL (ref 12.0–15.0)
Immature Granulocytes: 0 %
Lymphocytes Relative: 14 %
Lymphs Abs: 1.1 10*3/uL (ref 0.7–4.0)
MCH: 32.9 pg (ref 26.0–34.0)
MCHC: 34.8 g/dL (ref 30.0–36.0)
MCV: 94.4 fL (ref 80.0–100.0)
Monocytes Absolute: 0.7 10*3/uL (ref 0.1–1.0)
Monocytes Relative: 10 %
Neutro Abs: 5.6 10*3/uL (ref 1.7–7.7)
Neutrophils Relative %: 74 %
Platelets: 259 10*3/uL (ref 150–400)
RBC: 4.14 MIL/uL (ref 3.87–5.11)
RDW: 13.5 % (ref 11.5–15.5)
WBC: 7.5 10*3/uL (ref 4.0–10.5)
nRBC: 0 % (ref 0.0–0.2)

## 2020-04-20 MED ORDER — ALPRAZOLAM 0.25 MG PO TABS
0.2500 mg | ORAL_TABLET | Freq: Every evening | ORAL | 0 refills | Status: DC | PRN
Start: 2020-04-20 — End: 2020-10-07

## 2020-04-20 MED ORDER — SODIUM CHLORIDE 0.9% FLUSH
10.0000 mL | Freq: Once | INTRAVENOUS | Status: AC
  Administered 2020-04-20: 10 mL via INTRAVENOUS
  Filled 2020-04-20: qty 10

## 2020-04-20 MED ORDER — HEPARIN SOD (PORK) LOCK FLUSH 100 UNIT/ML IV SOLN
INTRAVENOUS | Status: AC
  Filled 2020-04-20: qty 5

## 2020-04-20 MED ORDER — HEPARIN SOD (PORK) LOCK FLUSH 100 UNIT/ML IV SOLN
500.0000 [IU] | Freq: Once | INTRAVENOUS | Status: AC
  Administered 2020-04-20: 500 [IU] via INTRAVENOUS
  Filled 2020-04-20: qty 5

## 2020-04-27 ENCOUNTER — Other Ambulatory Visit: Payer: Self-pay

## 2020-04-27 DIAGNOSIS — C50411 Malignant neoplasm of upper-outer quadrant of right female breast: Secondary | ICD-10-CM

## 2020-05-02 ENCOUNTER — Inpatient Hospital Stay: Payer: Medicaid Other

## 2020-05-04 DIAGNOSIS — C50411 Malignant neoplasm of upper-outer quadrant of right female breast: Secondary | ICD-10-CM

## 2020-05-04 NOTE — Research (Signed)
SWOG X2527 12 Week Visit:  Patient missed her scheduled lab appointment on Monday May 02, 2020 for her neuropathy assessment and labs for the study. She was seen by Dr. Grayland Ormond on August 18, which is in window for her 12 week follow up visit for the protocol. Research RN called the patient again today and left message for her to call to schedule another time for her to completed the visit requirements prior to May 11, 2020 to still be in the required protocol visit window.  Jeral Fruit, RN, BSN, OCN Date: 05/04/2020 Time: 1134 am

## 2020-05-05 ENCOUNTER — Other Ambulatory Visit: Payer: Self-pay | Admitting: Oncology

## 2020-05-10 ENCOUNTER — Other Ambulatory Visit: Payer: Self-pay | Admitting: General Surgery

## 2020-05-10 DIAGNOSIS — C50411 Malignant neoplasm of upper-outer quadrant of right female breast: Secondary | ICD-10-CM

## 2020-05-12 DIAGNOSIS — C50411 Malignant neoplasm of upper-outer quadrant of right female breast: Secondary | ICD-10-CM

## 2020-05-12 NOTE — Research (Signed)
SWOG P6940 12 Week Visit:  Call placed to patient again today to attempt and schedule her 12 week assessment with central labs, neuropathy assessment, and questionnaires. Message left on secure voice mail for the patient with the information regarding her visit that's required by the study, and for her to return call to the research office. Patient missed her scheduled appointment on 05/02/2020 and has not returned any calls to the research RN.  Requested that she come in either this afternoon or tomorrow to complete this visit, it will only be one day out of window at this time point. Encouraged her to call the research RN back as soon as she can, and hope that she is well. Jeral Fruit, RN, BSN, OCN Date: 05/12/2020 Time: 1450 pm

## 2020-05-17 ENCOUNTER — Institutional Professional Consult (permissible substitution): Payer: Medicaid Other | Admitting: Plastic Surgery

## 2020-05-20 ENCOUNTER — Institutional Professional Consult (permissible substitution): Payer: Medicaid Other | Admitting: Plastic Surgery

## 2020-05-27 ENCOUNTER — Ambulatory Visit (INDEPENDENT_AMBULATORY_CARE_PROVIDER_SITE_OTHER): Payer: Medicaid Other | Admitting: Plastic Surgery

## 2020-05-27 ENCOUNTER — Other Ambulatory Visit: Payer: Self-pay

## 2020-05-27 ENCOUNTER — Encounter: Payer: Self-pay | Admitting: Plastic Surgery

## 2020-05-27 VITALS — BP 102/72 | HR 94 | Temp 98.5°F | Ht 67.0 in | Wt 150.8 lb

## 2020-05-27 DIAGNOSIS — C50411 Malignant neoplasm of upper-outer quadrant of right female breast: Secondary | ICD-10-CM | POA: Diagnosis not present

## 2020-05-27 NOTE — Progress Notes (Deleted)
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  INTERVAL HISTORY: Patient returns to clinic today for further evaluation and reconsideration of cycle 11 of weekly Taxol.  Although diarrhea has improved, her performance status is still decreased and she has a worsening peripheral neuropathy.  She continues to have nausea had a poor appetite.  She has depressive symptoms, but her primary care physician is transitioning medications at this time.  She has no other neurologic complaints.  She denies any recent fevers or illnesses. She has no chest pain, shortness of breath, cough, or hemoptysis.  She has no urinary complaints.  Patient offers no further specific complaints today.  REVIEW OF SYSTEMS:   Review of Systems  Constitutional: Positive for malaise/fatigue. Negative for fever and weight loss.  HENT: Negative for sore throat.   Respiratory: Negative.  Negative for cough, hemoptysis and shortness of breath.   Cardiovascular: Negative for chest pain, palpitations and leg swelling.  Gastrointestinal: Positive for diarrhea and nausea. Negative for abdominal pain and vomiting.  Genitourinary: Negative.  Negative for dysuria.  Musculoskeletal: Positive for back pain. Negative for myalgias.  Skin: Negative.  Negative for rash.  Neurological: Positive for weakness. Negative for dizziness, focal weakness and headaches.    Psychiatric/Behavioral: Positive for depression. The patient has insomnia. The patient is not nervous/anxious.     As per HPI. Otherwise, a complete review of systems is negative.  PAST MEDICAL HISTORY: Past Medical History:  Diagnosis Date  . Brain tumor (benign) ([place])   . Breast cancer ([place]) 1999   Status post right breast lumpectomy and radiation  . COPD (chronic obstructive pulmonary disease) ([place])   . Depression   . History of chemotherapy 1999  . History of seizure disorder   . Hypertension   . Personal history of radiation therapy 1999  . Pneumonia     PAST SURGICAL HISTORY: Past Surgical History:  Procedure Laterality Date  . BRAIN SURGERY    . Brain tumor resection  1991  . BREAST BIOPSY Right 1999   Positive  . BREAST BIOPSY Right 11/10/2019   [place] bx 10:00 heart marker, path pending  . BREAST BIOPSY Right 11/10/2019   [place] bx axilla, hydromark 4, path pending  . BREAST BIOPSY Left 11/10/2019   [place] bx 2:00, venus marker, path pending  . PORTACATH PLACEMENT Left 11/30/2019   Procedure: INSERTION PORT-A-CATH;  Surgeon: Robert Bellow, MD;  Location: [facility] ORS;  Service: General;  Laterality: Left;  . Right breast lumpectomy    . TONSILLECTOMY    . TUBAL LIGATION      FAMILY HISTORY: Family History  Problem Relation Age of Onset  . Depression Mother   . CAD Father   . Hypertension Father   . Lung cancer Father   . Coronary artery disease Father 38  . Breast cancer Maternal Grandmother     ADVANCED DIRECTIVES (Y/N):  N  HEALTH MAINTENANCE: Social History   Tobacco Use  . Smoking status: Current Every Day Smoker    Packs/day:  0.50    Years: 40.00    Pack years: 20.00    Types: Cigarettes  . Smokeless tobacco: Never Used  Vaping Use  . Vaping Use: Never used  Substance Use Topics  . Alcohol use: No    Alcohol/week: 0.0 standard drinks  . Drug use: Yes    Frequency: 3.0 times per week    Types: Marijuana     Colonoscopy:  PAP:  Bone  density:  Lipid panel:  Allergies  Allergen Reactions  . Imitrex [Sumatriptan] Anaphylaxis  . Penicillins Rash    Did it involve swelling of the face/tongue/throat, SOB, or low BP? Unknown Did it involve sudden or severe rash/hives, skin peeling, or any reaction on the inside of your mouth or nose? Unknown Did you need to seek medical attention at a hospital or doctor's office? Unknown When did it last happen?? If all above answers are "NO", may proceed with cephalosporin use.     Current Outpatient Medications  Medication Sig Dispense Refill  . acetaminophen (TYLENOL) 500 MG tablet Take 500-1,000 mg by mouth every 6 (six) hours as needed (for pain.).    Marland Kitchen albuterol (VENTOLIN HFA) 108 (90 Base) MCG/ACT inhaler Inhale 1-2 puffs into the lungs every 6 (six) hours as needed for wheezing or shortness of breath.    . ALPRAZolam (XANAX) 0.25 MG tablet Take 1 tablet (0.25 mg total) by mouth at bedtime as needed for anxiety. 30 tablet 0  . amLODipine (NORVASC) 5 MG tablet Take 1 tablet (5 mg total) by mouth daily. (Patient not taking: Reported on 05/27/2020) 30 tablet 6  . diphenoxylate-atropine (LOMOTIL) 2.5-0.025 MG tablet TAKE 1 TABLET BY MOUTH 4 TIMES DAILY AS NEEDED FOR DIARRHEA OR LOOSE STOOLS 30 tablet 1  . lidocaine-prilocaine (EMLA) cream Apply to affected area once 30 g 3  . ondansetron (ZOFRAN) 8 MG tablet TAKE 1 TABLET BY MOUTH 2 TIMES DAILY AS NEEDED 60 tablet 2  . potassium chloride SA (KLOR-CON) 20 MEQ tablet Take 1 tablet (20 mEq total) by mouth 2 (two) times daily. 30 tablet 1  . prochlorperazine (COMPAZINE) 10 MG tablet TAKE 1 TABLET BY MOUTH EVERY 6 HOURS AS NEEDED (NAUSEA OR VOMITING) 60 tablet 2  . Pseudoeph-Doxylamine-DM-APAP (NYQUIL PO) Take 1-2 capsules by mouth at bedtime as needed (sleep.).  (Patient not taking: Reported on 05/27/2020)    . tetrahydrozoline 0.05 % ophthalmic solution Place 1-2 drops into both eyes 3 (three) times daily as needed (dry/irritated eyes).    .  traMADol (ULTRAM) 50 MG tablet Take 1 tablet (50 mg total) by mouth 2 (two) times daily. 60 tablet 0   No current facility-administered medications for this visit.   Facility-Administered Medications Ordered in Other Visits  Medication Dose Route Frequency Provider Last Rate Last Admin  . 0.9 %  sodium chloride infusion   Intravenous Once Lloyd Huger, MD 500 mL/hr at 04/06/20 1025 Rate Verify at 04/06/20 1025  . sodium chloride flush (NS) 0.9 % injection 10 mL  10 mL Intravenous PRN Lloyd Huger, MD      . sodium chloride flush (NS) 0.9 % injection 10 mL  10 mL Intravenous PRN Lloyd Huger, MD   10 mL at 01/13/20 0851    OBJECTIVE: There were no vitals filed for this visit.   There is no height or weight on file to calculate BMI.    ECOG FS:1 - Symptomatic but completely ambulatory  General: Well-developed, well-nourished, no acute distress. Eyes: Pink conjunctiva, anicteric sclera. HEENT:  Normocephalic, moist mucous membranes. Lungs: No audible wheezing or coughing. Heart: Regular rate and rhythm. Abdomen: Soft, nontender, no obvious distention. Musculoskeletal: No edema, cyanosis, or clubbing. Neuro: Alert, answering all questions appropriately. Cranial nerves grossly intact. Skin: No rashes or petechiae noted. Psych: Normal affect.   LAB RESULTS:  Lab Results  Component Value Date   NA 137 04/20/2020   K 3.3 (L) 04/20/2020   CL 103 04/20/2020   CO2 24 04/20/2020   GLUCOSE 102 (H) 04/20/2020   BUN <5 (L) 04/20/2020   CREATININE 0.78 04/20/2020   CALCIUM 9.1 04/20/2020   PROT 7.0 04/20/2020   ALBUMIN 3.9 04/20/2020   AST 19 04/20/2020   ALT 11 04/20/2020   ALKPHOS 91 04/20/2020   BILITOT 0.5 04/20/2020   GFRNONAA >60 04/20/2020   GFRAA >60 04/20/2020    Lab Results  Component Value Date   WBC 7.5 04/20/2020   NEUTROABS 5.6 04/20/2020   HGB 13.6 04/20/2020   HCT 39.1 04/20/2020   MCV 94.4 04/20/2020   PLT 259 04/20/2020     STUDIES: No  results found.  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  PLAN:    1. 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  Case also discussed with surgery.  Given the size and stage of patient's tumor, she will benefit from neoadjuvant chemotherapy with Adriamycin, Cytoxan and Udenyca support followed by weekly Taxol.  MUGA scan from November 26, 2019 revealed an EF of approximately 61%. CT scan of the chest, abdomen, and pelvis on December 01, 2019 reviewed independently with no obvious evidence of metastatic disease.  Patient completed 4 cycles of Adriamycin and Cytoxan.  She only received 10 infusions of weekly Taxol her last one occurring on April 06, 2020.  Given her persistently decreased performance status and worsening neuropathy, treatment has been discontinued.  Patient has an appointment with surgery on May 10, 2020.  Return to clinic in 6 weeks, postoperatively, for further evaluation and additional treatment planning.    2.  History of DCIS: Status post lumpectomy and XRT.  Patient reports she did not take any adjuvant hormonal therapy.  Given this history, have referred patient to genetic counseling for evaluation and testing. 3.  Insomnia: Patient was given a prescription for Xanax today. 4.  Neutropenia: Resolved. 5.  Hypokalemia: Chronic and unchanged.  Patient admits to being noncompliant with oral potassium supplementation. 6.  Tooth abscess: Resolved.  Patient will require an appointment with dentistry or oral surgery at the conclusion of her treatments. 7.  Back pain: Patient was instructed to use ibuprofen sparingly.  Continue tramadol as needed.   8.  Myalgias/restless leg: Patient does not complain of this today. 9.  Palpitations/syncope: Appreciate cardiology input.  Continue follow-up with cardiology as  scheduled.   10.  Depression: Chronic and unchanged.  Currently being managed by primary care. 11.  Diarrhea: Improved.  Continue Imodium as needed.  Patient expressed understanding and was in agreement with this plan. She also understands that She can call clinic at any time with any questions, concerns, or complaints.   Cancer Staging Primary cancer of upper outer quadrant of right female breast [facility]) Staging form: Breast, AJCC 8th Edition - Clinical stage from 11/26/2019: Stage IIIA (cT2, cN2a, cM0, G3, ER+, PR+, HER2-) - Signed by Lloyd Huger, MD on 11/26/2019   Lloyd Huger, MD  05/27/2020 4:07 PM

## 2020-05-27 NOTE — Progress Notes (Signed)
Patient ID: Kelsey Russo, female    DOB: Jan 07, 1966, 54 y.o.   MRN: [id number]   Chief Complaint  Patient presents with  . Breast Cancer    The patient is a 54 year old female here for a consultation for breast reconstruction.  She had right breast cancer in 1999.  She was treated with a lumpectomy and radiation.  In the past few months she noted a lump in the right breast and underwent a mammogram in March 2021.  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  She had diarrhea neuropathy she is planning on a right mastectomy.   He has a seizure disorder, a benign brain tumor, hypertension and depression.  Her support is her husband and daughter.  She is a smoker.  She does not have diabetes.  He has a recent weight loss of over 20 pounds secondary to the chemotherapy treatments.  The patient states that her her having children.  She is somewhat neutral about it.  She appears to be an A or B size bra cup.  She is not particularly interested in being any larger.  She does have grade 2 ptosis.   Review of Systems  Constitutional: Negative.   HENT: Negative.   Eyes: Negative.   Respiratory: Negative.   Cardiovascular: Negative.   Gastrointestinal: Negative.   Endocrine: Negative.   Genitourinary: Negative.   Musculoskeletal: Positive for back pain.  Neurological: Positive for numbness.  Psychiatric/Behavioral: Negative.     Past Medical History:  Diagnosis Date  . Brain tumor (benign) ([place])   . Breast cancer ([place]) 1999   Status post right breast lumpectomy and radiation  . COPD (chronic obstructive pulmonary disease) ([place])   . Depression   . History of chemotherapy 1999  . History of seizure disorder   . Hypertension   . Personal history of radiation therapy 1999  . Pneumonia     Past Surgical History:  Procedure Laterality Date  . BRAIN SURGERY     . Brain tumor resection  1991  . BREAST BIOPSY Right 1999   Positive  . BREAST BIOPSY Right 11/10/2019   [place] bx 10:00 heart marker, path pending  . BREAST BIOPSY Right 11/10/2019   [place] bx axilla, hydromark 4, path pending  . BREAST BIOPSY Left 11/10/2019   [place] bx 2:00, venus marker, path pending  . PORTACATH PLACEMENT Left 11/30/2019   Procedure: INSERTION PORT-A-CATH;  Surgeon: Robert Bellow, MD;  Location: [facility] ORS;  Service: General;  Laterality: Left;  . Right breast lumpectomy    . TONSILLECTOMY    . TUBAL LIGATION        Current Outpatient Medications:  .  acetaminophen (TYLENOL) 500 MG tablet, Take 500-1,000 mg by mouth every 6 (six) hours as needed (for pain.)., Disp: , Rfl:  .  albuterol (VENTOLIN HFA) 108 (90 Base) MCG/ACT inhaler, Inhale 1-2 puffs into the lungs every 6 (six) hours as needed for wheezing or shortness of breath., Disp: , Rfl:  .  ALPRAZolam (XANAX) 0.25 MG tablet, Take 1 tablet (0.25 mg total) by mouth at bedtime as needed for anxiety., Disp: 30 tablet, Rfl: 0 .  diphenoxylate-atropine (LOMOTIL) 2.5-0.025 MG tablet, TAKE 1 TABLET BY MOUTH 4 TIMES DAILY AS NEEDED FOR DIARRHEA OR LOOSE STOOLS, Disp: 30 tablet, Rfl: 1 .  lidocaine-prilocaine (EMLA) cream, Apply to affected area once, Disp: 30 g, Rfl: 3 .  ondansetron (ZOFRAN) 8 MG tablet, TAKE 1 TABLET BY MOUTH 2 TIMES DAILY AS NEEDED, Disp: 60 tablet, Rfl: 2 .  potassium chloride SA (KLOR-CON) 20 MEQ tablet, Take 1 tablet (20 mEq total) by mouth 2 (two) times daily., Disp: 30 tablet, Rfl: 1 .  prochlorperazine (COMPAZINE) 10 MG tablet, TAKE 1 TABLET BY MOUTH EVERY 6 HOURS AS NEEDED (NAUSEA OR VOMITING), Disp: 60 tablet, Rfl: 2 .  tetrahydrozoline 0.05 % ophthalmic solution, Place 1-2 drops into both eyes 3 (three) times daily as needed (dry/irritated eyes)., Disp: , Rfl:  .  traMADol (ULTRAM) 50 MG tablet, Take 1 tablet (50 mg total) by mouth 2 (two) times daily., Disp: 60 tablet, Rfl: 0 .  amLODipine  (NORVASC) 5 MG tablet, Take 1 tablet (5 mg total) by mouth daily. (Patient not taking: Reported on 05/27/2020), Disp: 30 tablet, Rfl: 6 .  Pseudoeph-Doxylamine-DM-APAP (NYQUIL PO), Take 1-2 capsules by mouth at bedtime as needed (sleep.).  (Patient not taking: Reported on 05/27/2020), Disp: , Rfl:  No current facility-administered medications for this visit.  Facility-Administered Medications Ordered in Other Visits:  .  0.9 %  sodium chloride infusion, , Intravenous, Once, Lloyd Huger, MD, Last Rate: 500 mL/hr at 04/06/20 1025, Rate Verify at 04/06/20 1025 .  sodium chloride flush (NS) 0.9 % injection 10 mL, 10 mL, Intravenous, PRN, Grayland Ormond, Kathlene November, MD .  sodium chloride flush (NS) 0.9 % injection 10 mL, 10 mL, Intravenous, PRN, Lloyd Huger, MD, 10 mL at 01/13/20 0851   Objective:   Vitals:   05/27/20 1106  BP: 102/72  Pulse: 94  Temp: 98.5 F (36.9 C)  SpO2: 95%    Physical Exam Vitals and nursing note reviewed.  Constitutional:      Appearance: Normal appearance.  HENT:     Head: Normocephalic and atraumatic.  Cardiovascular:     Rate and Rhythm: Normal rate.     Pulses: Normal pulses.  Pulmonary:     Effort: Pulmonary effort is normal. No respiratory distress.  Abdominal:     General: Abdomen is flat. There is no distension.     Tenderness: There is no abdominal tenderness.  Neurological:     General: No focal deficit present.     Mental Status: She is alert and oriented to person, place, and time.  Psychiatric:        Mood and Affect: Mood normal.        Behavior: Behavior normal.        Thought Content: Thought content normal.     Assessment & Plan:  Primary cancer of upper outer quadrant of right female breast [facility])  We had a detailed conversation about the patient's options for breast reconstruction. Several reconstruction options were explained to the patient.  It is important to remember that breast reconstruction is an optional procedure.  Reconstruction often requires several stages of surgery and this means more than one operation.  The surgeries are often done several months apart.  The entire process from start to finish can take a year or more. The major goal of breast reconstruction is to look normal in clothing. There will always be scars and a difference noticeable without clothes.  This is true for asymmetries where both breasts will not be identical.  Surgery may be needed or desired to the non-cancerous breast in order to achieve better symmetry and satisfactory results.  Regardless of the reconstructive method, there is always risks and the possibility that the procedure will fail or have  complications.  This couls required additional surgeries.    We discussed the available methods of breast reconstruction and included:  1. Tissue expander with Acellular dermal matrix followed by implant based reconstruction. This can be done as one surgery or multiple surgeries.  2. Autologous reconstruction can include using a muscle or tissue from another area of the body for the reconstruction.  3. Combined procedures like the latissismus dorsi flaps that often uses the muscle with an expander or implant.  For each of the method discussed the risks, benefits, scars and recovery time were discussed in detail. Specific risks included bleeding, infection, hematoma, seroma, scarring, pain, wound healing complications, flap loss, fat necrosis, capsular contracture, need for implant removal, donor site complications, bulge, hernia, umbilical necrosis, need for urgent reoperation, and need for dressing changes.   After the options were discussed we focused on the patient's desires and the procedure that was best for her based on all the information.  A total of 45 minutes of face-to-face time was spent in this encounter, of which >70% was spent in counseling.   Due to the patient's radiation history coupled with her smoking she is at very high risk  of complications and an inability to expand with an expander.  The safest thing would be for her to go ahead with the mastectomy and when she stops smoking come and see me.  Another option would be to place the expander and expand slowly while she tries to stop smoking.  If there were complications then she would either have to abandon the reconstruction or have a latissimus muscle flap.  She is not sure what she wants to do.  I encouraged her to talk to her husband about it.  We can do a telemetry visit in a week and see if she has any further questions.  Pictures were obtained of the patient and placed in the chart with the patient's or guardian's permission.    [place], DO

## 2020-05-30 ENCOUNTER — Telehealth: Payer: Self-pay | Admitting: *Deleted

## 2020-05-30 NOTE — Telephone Encounter (Signed)
Patient called to ask about follow up scheduled for Friday 10/1. Patient is still waiting on date for surgery, per Dr. Grayland Ormond she does not need to be seen until 2 weeks after surgery. Patients follow up for 10/1 cancelled at this time, patient to call clinic and let us know once she has her surgery scheduled and we can proceed with scheduling f/u at that time. Patient will need port flush as well with MD f/u visit. Patient verbalized understanding of plan at this time.

## 2020-06-02 ENCOUNTER — Inpatient Hospital Stay: Payer: Medicaid Other | Admitting: Oncology

## 2020-06-02 ENCOUNTER — Inpatient Hospital Stay: Payer: Medicaid Other

## 2020-06-07 ENCOUNTER — Ambulatory Visit (INDEPENDENT_AMBULATORY_CARE_PROVIDER_SITE_OTHER): Payer: Medicaid Other | Admitting: Plastic Surgery

## 2020-06-07 ENCOUNTER — Other Ambulatory Visit: Payer: Self-pay

## 2020-06-07 DIAGNOSIS — C50411 Malignant neoplasm of upper-outer quadrant of right female breast: Secondary | ICD-10-CM

## 2020-06-07 NOTE — Progress Notes (Signed)
The patient is a 54 year old female joining me a telemetry visit for further discussion about her reconstruction.  She had right breast cancer in 1999 and was found to have a recurrence in March 2021.  The biopsy was positive for an upper outer right breast invasive carcinoma.  It is estrogen and progesterone positive and HER-2 negative.  She has been undergoing adjuvant chemotherapy.  She has been working on quitting smoking.  She is around an area of the bra size with grade 2 ptosis.  We talked about a right mastectomy with expander and Flex HD placement.  She understands that with her previous radiation she may have a difficult time with expansion.  Our hope would be to be able to expand and exchanged to an implant if we were not able to do that or run into problems and she would need a latissimus muscle flap.  She still wants to move ahead with this plan.  I will send a note to the schedulers.  I connected with  AVERIANNA BRUGGER on 06/07/20 by a phone and verified that I am speaking with the correct person using two identifiers.   I discussed the limitations of evaluation and management by telemedicine. The patient expressed understanding and agreed to proceed.  I was at work and the patient was at home.  We spent 5 minutes in discussion.

## 2020-06-15 ENCOUNTER — Other Ambulatory Visit: Payer: Self-pay | Admitting: General Surgery

## 2020-06-15 DIAGNOSIS — C50411 Malignant neoplasm of upper-outer quadrant of right female breast: Secondary | ICD-10-CM

## 2020-06-15 NOTE — Progress Notes (Signed)
Subjective:     Patient ID: Kelsey Russo is a 54 y.o. female.  HPI  The following portions of the patient's history were reviewed and updated as appropriate.   History from March 2021 original office visit prior to initiation of neoadjuvant chemotherapy:  HPI: This is a 54 year old female who is being seen for follow-up after breast cancer. Here for evaluation of right breast cancer with a history of right breast cancer in 1999. She states she could feel a lump in the right breast back in January but wasn't sure what she should do. When she went to the ED for a sinus infection she asked about the breast mass and was then referred to the [facility] program. Denies breast injury or trauma. She is here with her daughter, Mike Gip. There are no records available from her treatment at [facility]. She reports that she had surgery followed by radiation. She did not have genetic testing that she is aware of. She did not require axillary node biopsy, nor was she given any medication (anti-estrogen) to take after surgery. The patient underwent bilateral diagnostic mammograms on November 03, 2019. She had biopsies of both the dominant mass which was palpable in the upper portion of the right breast as well as a palpable right axillary lymph node. 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 The patient reports that she had minimal discomfort after her biopsies were completed. The patient works at the [employer] in [place]. This is a fairly physical job, and she is concerned as to how she will be able to work after surgery and/ or during treatment.  This an established patient is here today for: office visit. Patient has been referred by Dr. Grayland Ormond for evaluation of breast cancer. She completed chemotherapy on 04-06-20.  The patient reports that the last treatments were held due to her profound diarrhea and difficulty maintaining an adequate serum potassium.  The  patient had significant weight loss during treatment, by our records 21 pounds between March and September 2021.       Chief Complaint  Patient presents with  . Treatment Plan Discussion    breast cancer     BP 132/84   Pulse 103   Temp 36.1 C (97 F)   Ht 170.2 cm ($RemoveB'5\' 7"'TuZqIcae$ )   Wt 65.8 kg (145 lb)   SpO2 99%   BMI 22.71 kg/m       Past Medical History:  Diagnosis Date  . Breast cancer (CMS-HCC) 1999   right breast  . Depression   . History of chemotherapy 1999  . History of radiation therapy 1999  . Hypertension   . Seizure disorder (CMS-HCC)           Past Surgical History:  Procedure Laterality Date  . CRANIOTOMY FOR BRAIN TUMOR RESECTION  1991  . INCISIONAL BIOPSY BREAST Right 11/10/2019  . LAPAROSCOPIC TUBAL LIGATION  1994  . MASTECTOMY PARTIAL / LUMPECTOMY Right 1999   at [facility]              OB History    Gravida  5   Para  3   Term      Preterm      AB  2   Living        SAB  2   TAB      Ectopic      Molar      Multiple      Live Births  Obstetric Comments  Age at first period 71 Age of first pregnancy 77 Age last period 29         Social History          Socioeconomic History  . Marital status: Married    Spouse name: Not on file  . Number of children: Not on file  . Years of education: Not on file  . Highest education level: Not on file  Occupational History  . Not on file  Tobacco Use  . Smoking status: Current Every Day Smoker    Packs/day: 1.00    Years: 40.00    Pack years: 40.00    Types: Cigarettes  . Smokeless tobacco: Never Used  Substance and Sexual Activity  . Alcohol use: Never  . Drug use: Yes    Types: Marijuana    Comment: occasionally   . Sexual activity: Not on file  Other Topics Concern  . Not on file  Social History Narrative  . Not on file   Social Determinants of Health      Financial Resource Strain:   . Difficulty of Paying Living  Expenses:   Food Insecurity:   . Worried About Charity fundraiser in the Last Year:   . Arboriculturist in the Last Year:   Transportation Needs:   . Film/video editor (Medical):   Marland Kitchen Lack of Transportation (Non-Medical):            Allergies  Allergen Reactions  . Imitrex [Sumatriptan] Anaphylaxis  . Penicillin Rash    Current Medications        Current Outpatient Medications  Medication Sig Dispense Refill  . acetaminophen (TYLENOL) 500 MG tablet Take by mouth every 8 (eight) hours as needed       . albuterol 90 mcg/actuation inhaler Inhale into the lungs every 6 (six) hours as needed       . ALPRAZolam (XANAX) 0.25 MG tablet Take by mouth once daily as needed       . diphenoxylate-atropine (LOMOTIL) 2.5-0.025 mg tablet TAKE 1 TABLET BY MOUTH 4 TIMES DAILY AS NEEDED FOR DIARRHEA OR LOOSE STOOLS    . ondansetron (ZOFRAN) 8 MG tablet TAKE 1 TABLET BY MOUTH 2 TIMES DAILY AS NEEDED    . potassium chloride (KLOR-CON) 20 MEQ ER tablet Take by mouth 2 (two) times daily       . prochlorperazine (COMPAZINE) 10 MG tablet TAKE 1 TABLET BY MOUTH EVERY 6 HOURS AS NEEDED (NAUSEA OR VOMITING)    . tetrahydrozoline (VISINE) 0.05 % ophthalmic solution Apply to eye as directed       . traMADoL (ULTRAM) 50 mg tablet Take 50 mg by mouth every 8 (eight) hours as needed       . vilazodone (VIIBRYD) 20 mg tablet Take 20 mg by mouth once daily     No current facility-administered medications for this visit.           Family History  Problem Relation Age of Onset  . Depression Mother   . Lung cancer Father   . High blood pressure (Hypertension) Father   . Coronary Artery Disease (Blocked arteries around heart) Father   . Breast cancer Maternal Grandmother   . Ovarian cancer Paternal Aunt   . Bone cancer Maternal Grandfather   . Colon cancer Neg Hx      Labs and Radiology:   November 10, 2019 radiology biopsy: A. RIGHT BREAST, 10:00 2CMFN;  ULTRASOUND-GUIDED NEEDLE CORE BIOPSY:  -  INVASIVE MAMMARY CARCINOMA, NO SPECIAL TYPE.   Size of invasive carcinoma: 4 mm in this sample  Histologic grade of invasive carcinoma: Grade 3            Glandular/tubular differentiation score: 3            Nuclear pleomorphism score: 2            Mitotic rate score: 3            Total score: 8  Ductal carcinoma in situ: Not identified  Lymphovascular invasion: Not identified   Comment: Immunohistochemistry will be performed on block A1, with  reflex to [place] for HER2 2+. The results will be reported in an addendum.  The definitive grade will be assigned on the excisional specimen.   B. LYMPH NODE, RIGHT AXILLA; ULTRASOUND-GUIDED NEEDLE CORE BIOPSY:  - METASTATIC MAMMARY CARCINOMA, (AS DESCRIBED IN PART A), 7 MM IN THIS  SAMPLE.  BREAST BIOMARKER TESTS  Estrogen Receptor (ER) Status: POSITIVE            Percentage of cells with nuclear positivity:  91-100%            Average intensity of staining: Strong   Progesterone Receptor (PgR) Status: POSITIVE            Percentage of cells with nuclear positivity: 41-50%            Average intensity of staining: Strong   HER2 (by immunohistochemistry): EQUIVOCAL (Score 2+)   BREAST BIOMARKER TESTS  HER2 (by in situ hybridization): Negative   Ultrasound of November 03, 2019 described a 8 mm right axillary lymph node.   Review of Systems  Constitutional: Negative for chills and fever.  Respiratory: Negative for cough.        Objective:   Physical Exam Exam conducted with a chaperone present.  Constitutional:      Appearance: Normal appearance.  Cardiovascular:     Rate and Rhythm: Normal rate and regular rhythm.     Pulses: Normal pulses.     Heart sounds: Normal heart sounds.  Pulmonary:     Effort: Pulmonary effort is normal.     Breath sounds: Normal breath sounds.  Chest:     Musculoskeletal:     Cervical back: Neck supple.  Skin:    General: Skin is warm and dry.  Neurological:     Mental Status: She is alert and oriented to person, place, and time.  Psychiatric:        Mood and Affect: Mood normal.        Behavior: Behavior normal.        Assessment:     Excellent clinical response to neoadjuvant chemotherapy.    Plan:     The majority of today's visit was devoted to review of options for management now that she is completed neoadjuvant chemotherapy.    Options for management were reviewed: 1) standard therapy post prior wide excision and whole breast radiation would include mastectomy with sentinel node biopsy, possible axillary dissection versus 2) repeat wide excision of the recent biopsy site and perhaps targeted brachytherapy/MammoSite to this area with sentinel node biopsy and axillary dissection if the lymph node shows residual disease.  The patient desires to proceed to mastectomy.  She would like to have a breast, and is interested in immediate reconstruction.  We will arrange for plastic surgery consultation.  Once she has been evaluated by plastic surgery, we will schedule the patient for a right mastectomy with sentinel  node biopsy/possible axillary dissection with immediate reconstruction by the plastic surgery service.     Entered by Ledell Noss, CMA, acting as a scribe for Dr. Hervey Ard, MD.   The documentation recorded by the scribe accurately reflects the service I personally performed and the decisions made by me.   Robert Bellow, MD FACS      Electronically signed by Mayer Masker, MD on 05/10/2020 1:11 PM

## 2020-06-16 ENCOUNTER — Other Ambulatory Visit: Payer: Self-pay

## 2020-06-16 ENCOUNTER — Inpatient Hospital Stay: Payer: Medicaid Other | Attending: Oncology

## 2020-06-16 DIAGNOSIS — C50411 Malignant neoplasm of upper-outer quadrant of right female breast: Secondary | ICD-10-CM | POA: Insufficient documentation

## 2020-06-16 DIAGNOSIS — Z452 Encounter for adjustment and management of vascular access device: Secondary | ICD-10-CM | POA: Diagnosis not present

## 2020-06-16 DIAGNOSIS — Z95828 Presence of other vascular implants and grafts: Secondary | ICD-10-CM

## 2020-06-16 MED ORDER — HEPARIN SOD (PORK) LOCK FLUSH 100 UNIT/ML IV SOLN
INTRAVENOUS | Status: AC
  Filled 2020-06-16: qty 5

## 2020-06-16 MED ORDER — HEPARIN SOD (PORK) LOCK FLUSH 100 UNIT/ML IV SOLN
500.0000 [IU] | Freq: Once | INTRAVENOUS | Status: AC
  Administered 2020-06-16: 500 [IU] via INTRAVENOUS
  Filled 2020-06-16: qty 5

## 2020-06-16 MED ORDER — SODIUM CHLORIDE 0.9% FLUSH
10.0000 mL | Freq: Once | INTRAVENOUS | Status: AC
  Administered 2020-06-16: 10 mL via INTRAVENOUS
  Filled 2020-06-16: qty 10

## 2020-06-21 ENCOUNTER — Encounter: Payer: Self-pay | Admitting: Surgical

## 2020-06-21 ENCOUNTER — Other Ambulatory Visit: Payer: Self-pay

## 2020-06-21 ENCOUNTER — Ambulatory Visit (INDEPENDENT_AMBULATORY_CARE_PROVIDER_SITE_OTHER): Payer: Medicaid Other | Admitting: Surgical

## 2020-06-21 VITALS — BP 153/100 | HR 88 | Temp 97.1°F | Ht 67.0 in | Wt 138.0 lb

## 2020-06-21 DIAGNOSIS — C50411 Malignant neoplasm of upper-outer quadrant of right female breast: Secondary | ICD-10-CM

## 2020-06-21 MED ORDER — CIPROFLOXACIN HCL 500 MG PO TABS: 500.0000 mg | ORAL_TABLET | Freq: Two times a day (BID) | ORAL | 0 refills | Status: DC

## 2020-06-21 MED ORDER — ONDANSETRON HCL 4 MG PO TABS: 4.0000 mg | ORAL_TABLET | Freq: Three times a day (TID) | ORAL | 0 refills | Status: DC | PRN

## 2020-06-21 NOTE — Progress Notes (Signed)
Patient ID: Kelsey Russo, female    DOB: 10-20-1965, 54 y.o.   MRN: [id number]  No chief complaint on file.     ICD-10-CM   1. Primary cancer of upper outer quadrant of right female breast ([place])  C50.411      History of Present Illness: Kelsey Russo is a 54 y.o.  female  with a history of upper outer right breast invasive carcinoma, ER/PR +, HER2 negative.  She presents for preoperative evaluation for upcoming procedure, immediate right breast reconstruction with placement of tissue expander and Flex HD, scheduled for 07/13/2020 with Dr. Marla Roe after simple mastectomy and axillary sentinel node biopsy on the right by Dr. Bary Castilla  The patient has had problems with anesthesia: PONV No history of DVT/PE.  No family history of DVT/PE.  No family or personal history of bleeding or clotting disorders.  Patient is not currently taking any blood thinners.  No history of CVA/MI.   Summary of Previous Visit: 54 year old with a history of right breast cancer in 1999 which was treated with lumpectomy and radiation.  Recent diagnosis of right breast invasive carcinoma that is ER/PR positive and HER-2 negative in March 2021.  Patient has underwent chemotherapy.  Past medical history of a seizure disorder, benign brain tumor, hypertension, depression, COPD. Patient had follow-up with cardiology in regards to palpitations, syncope on 04/07/2020.  ZIO and echocardiogram without any acute findings, patient had no recurrent syncope, suspected likely exacerbated by dehydration and eating poorly after chemo treatments.  Patient is a smoker - she has recently quit smoking and is it has been 2 weeks and 2 days.    Past Medical History: Allergies: Allergies  Allergen Reactions  . Imitrex [Sumatriptan] Anaphylaxis  . Penicillins Rash    Did it involve swelling of the face/tongue/throat, SOB, or low BP? Unknown Did it involve sudden or severe rash/hives, skin peeling, or any reaction on the  inside of your mouth or nose? Unknown Did you need to seek medical attention at a hospital or doctor's office? Unknown When did it last happen?? If all above answers are "NO", may proceed with cephalosporin use.     Current Medications:  Current Outpatient Medications:  .  acetaminophen (TYLENOL) 500 MG tablet, Take 500-1,000 mg by mouth every 6 (six) hours as needed (for pain.)., Disp: , Rfl:  .  albuterol (VENTOLIN HFA) 108 (90 Base) MCG/ACT inhaler, Inhale 1-2 puffs into the lungs every 6 (six) hours as needed for wheezing or shortness of breath., Disp: , Rfl:  .  ALPRAZolam (XANAX) 0.25 MG tablet, Take 1 tablet (0.25 mg total) by mouth at bedtime as needed for anxiety., Disp: 30 tablet, Rfl: 0 .  amLODipine (NORVASC) 5 MG tablet, Take 1 tablet (5 mg total) by mouth daily. (Patient not taking: Reported on 05/27/2020), Disp: 30 tablet, Rfl: 6 .  diphenoxylate-atropine (LOMOTIL) 2.5-0.025 MG tablet, TAKE 1 TABLET BY MOUTH 4 TIMES DAILY AS NEEDED FOR DIARRHEA OR LOOSE STOOLS, Disp: 30 tablet, Rfl: 1 .  lidocaine-prilocaine (EMLA) cream, Apply to affected area once, Disp: 30 g, Rfl: 3 .  ondansetron (ZOFRAN) 8 MG tablet, TAKE 1 TABLET BY MOUTH 2 TIMES DAILY AS NEEDED, Disp: 60 tablet, Rfl: 2 .  potassium chloride SA (KLOR-CON) 20 MEQ tablet, Take 1 tablet (20 mEq total) by mouth 2 (two) times daily., Disp: 30 tablet, Rfl: 1 .  prochlorperazine (COMPAZINE) 10 MG tablet, TAKE 1 TABLET BY MOUTH EVERY 6 HOURS AS NEEDED (NAUSEA OR VOMITING), Disp:  60 tablet, Rfl: 2 .  Pseudoeph-Doxylamine-DM-APAP (NYQUIL PO), Take 1-2 capsules by mouth at bedtime as needed (sleep.).  (Patient not taking: Reported on 05/27/2020), Disp: , Rfl:  .  tetrahydrozoline 0.05 % ophthalmic solution, Place 1-2 drops into both eyes 3 (three) times daily as needed (dry/irritated eyes)., Disp: , Rfl:  .  traMADol (ULTRAM) 50 MG tablet, Take 1 tablet (50 mg total) by mouth 2 (two) times daily., Disp: 60 tablet, Rfl: 0 No current  facility-administered medications for this visit.  Facility-Administered Medications Ordered in Other Visits:  .  0.9 %  sodium chloride infusion, , Intravenous, Once, Lloyd Huger, MD, Last Rate: 500 mL/hr at 04/06/20 1025, Rate Verify at 04/06/20 1025 .  sodium chloride flush (NS) 0.9 % injection 10 mL, 10 mL, Intravenous, PRN, Grayland Ormond, Kathlene November, MD .  sodium chloride flush (NS) 0.9 % injection 10 mL, 10 mL, Intravenous, PRN, Lloyd Huger, MD, 10 mL at 01/13/20 2505  Past Medical Problems: Past Medical History:  Diagnosis Date  . Brain tumor (benign) ([place])   . Breast cancer ([place]) 1999   Status post right breast lumpectomy and radiation  . COPD (chronic obstructive pulmonary disease) ([place])   . Depression   . History of chemotherapy 1999  . History of seizure disorder   . Hypertension   . Personal history of radiation therapy 1999  . Pneumonia     Past Surgical History: Past Surgical History:  Procedure Laterality Date  . BRAIN SURGERY    . Brain tumor resection  1991  . BREAST BIOPSY Right 1999   Positive  . BREAST BIOPSY Right 11/10/2019   [place] bx 10:00 heart marker, path pending  . BREAST BIOPSY Right 11/10/2019   [place] bx axilla, hydromark 4, path pending  . BREAST BIOPSY Left 11/10/2019   [place] bx 2:00, venus marker, path pending  . PORTACATH PLACEMENT Left 11/30/2019   Procedure: INSERTION PORT-A-CATH;  Surgeon: Robert Bellow, MD;  Location: [facility] ORS;  Service: General;  Laterality: Left;  . Right breast lumpectomy    . TONSILLECTOMY    . TUBAL LIGATION      Social History: Social History   Socioeconomic History  . Marital status: Married    Spouse name: Not on file  . Number of children: Not on file  . Years of education: Not on file  . Highest education level: Not on file  Occupational History  . Not on file  Tobacco Use  . Smoking status: Current Every Day Smoker    Packs/day: 0.50    Years: 40.00    Pack years: 20.00    Types: Cigarettes   . Smokeless tobacco: Never Used  Vaping Use  . Vaping Use: Never used  Substance and Sexual Activity  . Alcohol use: No    Alcohol/week: 0.0 standard drinks  . Drug use: Yes    Frequency: 3.0 times per week    Types: Marijuana  . Sexual activity: Not on file  Other Topics Concern  . Not on file  Social History Narrative   Lives at home with husband.   Social Determinants of Health   Financial Resource Strain:   . Difficulty of Paying Living Expenses: Not on file  Food Insecurity:   . Worried About Charity fundraiser in the Last Year: Not on file  . Ran Out of Food in the Last Year: Not on file  Transportation Needs:   . Lack of Transportation (Medical): Not on file  . Lack  of Transportation (Non-Medical): Not on file  Physical Activity:   . Days of Exercise per Week: Not on file  . Minutes of Exercise per Session: Not on file  Stress:   . Feeling of Stress : Not on file  Social Connections:   . Frequency of Communication with Friends and Family: Not on file  . Frequency of Social Gatherings with Friends and Family: Not on file  . Attends Religious Services: Not on file  . Active Member of Clubs or Organizations: Not on file  . Attends Archivist Meetings: Not on file  . Marital Status: Not on file  Intimate Partner Violence:   . Fear of Current or Ex-Partner: Not on file  . Emotionally Abused: Not on file  . Physically Abused: Not on file  . Sexually Abused: Not on file    Family History: Family History  Problem Relation Age of Onset  . Depression Mother   . CAD Father   . Hypertension Father   . Lung cancer Father   . Coronary artery disease Father 60  . Breast cancer Maternal Grandmother     Review of Systems: Review of Systems  Constitutional: Negative.   Respiratory: Negative.   Cardiovascular: Negative for chest pain, palpitations, orthopnea, claudication and leg swelling.  Skin: Negative.   Neurological: Negative for focal weakness, loss  of consciousness and weakness.    Physical Exam: Vital Signs BP (!) 153/100 (BP Location: Left Arm, Patient Position: Sitting, Cuff Size: Normal)   Pulse 88   Temp (!) 97.1 F (36.2 C) (Temporal)   Ht $R'5\' 7"'YI$  (1.702 m)   Wt 138 lb (62.6 kg)   SpO2 96%   BMI 21.61 kg/m   Physical Exam Exam conducted with a chaperone present.  Constitutional:      General: She is not in acute distress.    Appearance: Normal appearance. She is not ill-appearing.  HENT:     Head: Normocephalic and atraumatic.  Eyes:     Pupils: Pupils are equal, round Neck:     Musculoskeletal: Normal range of motion.  Cardiovascular:     Rate and Rhythm: Normal rate and regular rhythm.     Pulses: Normal pulses.     Heart sounds: Normal heart sounds. No murmur.  Pulmonary:     Effort: Pulmonary effort is normal. No respiratory distress.     Breath sounds: Normal breath sounds. No wheezing.  Abdominal:     General: Abdomen is flat. There is no distension.     Palpations: Abdomen is soft.     Tenderness: There is no abdominal tenderness.  Musculoskeletal: Normal range of motion.  Skin:    General: Skin is warm and dry.     Findings: No erythema or rash.  Neurological:     General: No focal deficit present.     Mental Status: She is alert and oriented to person, place, and time. Mental status is at baseline.     Motor: No weakness.  Psychiatric:        Mood and Affect: Mood normal.        Behavior: Behavior normal.    Assessment/Plan: The patient is scheduled for immediate right breast reconstruction placement tissue expander and Flex HD with Dr. Marla Roe after right simple mastectomy and axillary sentinel lymph node biopsy by Dr. Tollie Pizza on 07/13/2020. Risks, benefits, and alternatives of procedure discussed, questions answered and consent obtained.    Smoking Status: Quit 2 weeks ago; Counseling Given?  Yes, discussed complications from  smoking and increased risk of necrosis/poor wound healing. Last  Mammogram: 11/03/2019  Caprini Score: 9, highest; Risk Factors include: Age, current malignancy, recent chemotherapy, currently has Port-A-Cath in place, history of COPD, and length of planned surgery. Recommendation for mechanical and pharmacological prophylaxis. Encourage early ambulation.   Pictures obtained:[email or web address]   Post-op Rx sent to pharmacy: Zofran, Cipro, discussed with patient will hold tramadol and Flexeril and send in on day of surgery.  Patient currently takes tramadol twice daily as prescribed by her PCP for chronic pain.  Patient also takes trazodone for sleep and Xanax for anxiety.  We had a discussion today in regards to the medications that she currently takes and the prescribed medications for the surgery.  I discussed with the patient that Flexeril will be used instead of Valium as she is currently taking Xanax and trazodone for sleep/anxiety and would not want to cause her to have excessive sedation.  I discussed with the patient that the tramadol will be sent into the pharmacy on the day of surgery and she is to not take the tramadol she takes for chronic pain during the 5 days postop which she is prescribed tramadol.  Patient is in agreement with this.  She will resume taking her chronic pain tramadol when she has completed the course of postoperative narcotics.  Patient was provided with the tissue expander and General Surgical Risk consent document and Pain Medication Agreement prior to their appointment.  They had adequate time to read through the risk consent documents and Pain Medication Agreement. We also discussed them in person together during this preop appointment. All of their questions were answered to their satisfaction.  Recommended calling if they have any further questions.  Risk consent form and Pain Medication Agreement to be scanned into patient's chart.  The risks that can be encountered with and after placement of a breast expander placement were discussed and  include the following but not limited to these: bleeding, infection, delayed healing, anesthesia risks, skin sensation changes, injury to structures including nerves, blood vessels, and muscles which may be temporary or permanent, allergies to tape, suture materials and glues, blood products, topical preparations or injected agents, skin contour irregularities, skin discoloration and swelling, deep vein thrombosis, cardiac and pulmonary complications, pain, which may persist, fluid accumulation, wrinkling of the skin over the expander, changes in nipple or breast sensation, expander leakage or rupture, faulty position of the expander, persistent pain, formation of tight scar tissue around the expander (capsular contracture), possible need for revisional surgery or staged procedures.    Electronically signed by: Carola Rhine Iosefa Weintraub, PA-C 06/21/2020 3:14 PM

## 2020-07-01 ENCOUNTER — Other Ambulatory Visit
Admission: RE | Admit: 2020-07-01 | Discharge: 2020-07-01 | Disposition: A | Discharge status: Home / Self Care | Payer: Medicaid Other | Source: Ambulatory Visit | Attending: General Surgery | Admitting: General Surgery

## 2020-07-01 HISTORY — DX: Nausea with vomiting, unspecified: Z98.890

## 2020-07-01 HISTORY — DX: Other specified postprocedural states: R11.2

## 2020-07-01 HISTORY — DX: Anemia, unspecified: D64.9

## 2020-07-01 HISTORY — DX: Nausea with vomiting, unspecified: R11.2

## 2020-07-01 NOTE — Patient Instructions (Addendum)
Your procedure is scheduled on: 07/13/20- Wednesday Report to Day Surgery on the 2nd floor of the [facility]. To find out your arrival time, please call [phone] between 1PM - 3PM on: 07/12/20 Covid test - 07/11/20- Soap and Instructions  REMEMBER: Instructions that are not followed completely may result in serious medical risk, up to and including death; or upon the discretion of your surgeon and anesthesiologist your surgery may need to be rescheduled.  Do not eat food after midnight the night before surgery.  No gum chewing, lozengers or hard candies.  You may however, drink CLEAR liquids up to 2 hours before you are scheduled to arrive for your surgery. Do not drink anything within 2 hours of your scheduled arrival time.  Clear liquids include: - water  - apple juice without pulp - gatorade (not RED, PURPLE, OR BLUE) - black coffee or tea (Do NOT add milk or creamers to the coffee or tea) Do NOT drink anything that is not on this list.   TAKE THESE MEDICATIONS THE MORNING OF SURGERY WITH A SIP OF WATER: - acetaminophen (TYLENOL) 500 MG tablet if needed. - ALPRAZolam (XANAX) 0.25 MG tablet  Use inhaler albuterol (VENTOLIN HFA) 108 (90 Base) MCG/ACT inhaler on the day of surgery and bring to the hospital.  One week prior to surgery: Stop Anti-inflammatories (NSAIDS) such as Advil, Aleve, Ibuprofen, Motrin, Naproxen, Naprosyn and Aspirin based products such as Excedrin, Goodys Powder, BC Powder.   Stop ANY OVER THE COUNTER supplements until after surgery. (You may continue taking Tylenol, Vitamin D, Vitamin B, and multivitamin.)  No Alcohol for 24 hours before or after surgery.  No Smoking including e-cigarettes for 24 hours prior to surgery.  No chewable tobacco products for at least 6 hours prior to surgery.  No nicotine patches on the day of surgery.  Do not use any "recreational" drugs for at least a week prior to your surgery.  Please be advised that the  combination of cocaine and anesthesia may have negative outcomes, up to and including death. If you test positive for cocaine, your surgery will be cancelled.  On the morning of surgery brush your teeth with toothpaste and water, you may rinse your mouth with mouthwash if you wish. Do not swallow any toothpaste or mouthwash.  Do not wear jewelry, make-up, hairpins, clips or nail polish.  Do not wear lotions, powders, or perfumes.   Do not shave 48 hours prior to surgery.   Contact lenses, hearing aids and dentures may not be worn into surgery.  Do not bring valuables to the hospital. [facility] is not responsible for any missing/lost belongings or valuables.   Use CHG Soap or wipes as directed on instruction sheet.  Notify your doctor if there is any change in your medical condition (cold, fever, infection).  Wear comfortable clothing (specific to your surgery type) to the hospital.  Plan for stool softeners for home use; pain medications have a tendency to cause constipation. You can also help prevent constipation by eating foods high in fiber such as fruits and vegetables and drinking plenty of fluids as your diet allows.  After surgery, you can help prevent lung complications by doing breathing exercises.  Take deep breaths and cough every 1-2 hours. Your doctor may order a device called an Incentive Spirometer to help you take deep breaths. When coughing or sneezing, hold a pillow firmly against your incision with both hands. This is called "splinting." Doing this helps protect your incision. It  also decreases belly discomfort.  If you are being admitted to the hospital overnight, leave your suitcase in the car. After surgery it may be brought to your room.  If you are being discharged the day of surgery, you will not be allowed to drive home. You will need a responsible adult (18 years or older) to drive you home and stay with you that night.   If you are taking public  transportation, you will need to have a responsible adult (18 years or older) with you. Please confirm with your physician that it is acceptable to use public transportation.   Please call the [facility] at [phone] if you have any questions about these instructions.  Visitation Policy:  Patients undergoing a surgery or procedure may have one family member or support person with them as long as that person is not COVID-19 positive or experiencing its symptoms.  That person may remain in the waiting area during the procedure.  Inpatient Visitation Update:   In an effort to ensure the safety of our team members and our patients, we are implementing a change to our visitation policy:  Effective Monday, Aug. 9, at 7 a.m., inpatients will be allowed one support person.  o The support person may change daily.  o The support person must pass our screening, gel in and out, and wear a mask at all times, including in the patient's room.  o Patients must also wear a mask when staff or their support person are in the room.  o Masking is required regardless of vaccination status.  Systemwide, no visitors 17 or younger.

## 2020-07-07 NOTE — H&P (Signed)
Subjective:     Patient ID: Kelsey Russo is a 54 y.o. female.  HPI  The following portions of the patient's history were reviewed and updated as appropriate.  This an established patient is here today for: office visit. Here for her preoperative, right mastectomy with initial plans for immediate reconstruction with Dr Marla Roe on 07-13-20.  The patient reports she has become more anxious of late and is decided at this time to defer reconstruction.  Her blood pressure has been normal during her chemotherapy, and amlodipine had been discontinued.  With her anxiety her blood pressure has been up.  She has restarted her amlodipine. .   Review of Systems  Constitutional: Negative for chills and fever.  Respiratory: Negative for cough.         Chief Complaint  Patient presents with  . Pre-op Exam    right mastectomy with immediate reconstruction     BP (!) 164/112   Pulse 103   Temp 36.7 C (98.1 F)   Ht 170.2 cm (_0 )   Wt 64.9 kg (143 lb)   SpO2 98%   BMI 22.40 kg/m       Past Medical History:  Diagnosis Date  . Breast cancer (CMS-HCC) 1999   right breast  . Depression   . Hard of hearing   . History of chemotherapy 1999  . History of radiation therapy 1999  . Hypertension   . Malignant neoplasm of right breast (CMS-HCC) 11/2019  . Seizure disorder (CMS-HCC)           Past Surgical History:  Procedure Laterality Date  . CRANIOTOMY FOR BRAIN TUMOR RESECTION  1991  . INCISIONAL BIOPSY BREAST Right 11/10/2019  . LAPAROSCOPIC TUBAL LIGATION  1994  . MASTECTOMY PARTIAL / LUMPECTOMY Right 1999   at [facility]              OB History    Gravida  5   Para  3   Term      Preterm      AB  2   Living        SAB  2   IAB      Ectopic      Molar      Multiple      Live Births          Obstetric Comments  Age at first period 41 Age of first pregnancy 52 Age last period 35         Social History           Socioeconomic History  . Marital status: Married    Spouse name: Not on file  . Number of children: Not on file  . Years of education: Not on file  . Highest education level: Not on file  Occupational History  . Not on file  Tobacco Use  . Smoking status: Current Every Day Smoker    Packs/day: 1.00    Years: 40.00    Pack years: 40.00    Types: Cigarettes  . Smokeless tobacco: Never Used  Substance and Sexual Activity  . Alcohol use: Never  . Drug use: Yes    Types: Marijuana    Comment: occasionally   . Sexual activity: Not on file  Other Topics Concern  . Not on file  Social History Narrative  . Not on file   Social Determinants of Health   Financial Resource Strain: Not on file  Food Insecurity: Not on file  Transportation Needs: Not  on file       Allergies  Allergen Reactions  . Imitrex [Sumatriptan] Anaphylaxis  . Penicillin Rash    Current Medications        Current Outpatient Medications  Medication Sig Dispense Refill  . acetaminophen (TYLENOL) 500 MG tablet Take by mouth every 8 (eight) hours as needed       . albuterol 90 mcg/actuation inhaler Inhale into the lungs every 6 (six) hours as needed       . ALPRAZolam (XANAX) 0.25 MG tablet Take by mouth once daily as needed       . amLODIPine (NORVASC) 5 MG tablet Take 5 mg by mouth once daily    . diphenoxylate-atropine (LOMOTIL) 2.5-0.025 mg tablet TAKE 1 TABLET BY MOUTH 4 TIMES DAILY AS NEEDED FOR DIARRHEA OR LOOSE STOOLS    . lidocaine-prilocaine (EMLA) cream Apply to affected area once    . ondansetron (ZOFRAN) 8 MG tablet TAKE 1 TABLET BY MOUTH 2 TIMES DAILY AS NEEDED    . potassium chloride (KLOR-CON) 20 MEQ ER tablet Take by mouth 2 (two) times daily       . prochlorperazine (COMPAZINE) 10 MG tablet TAKE 1 TABLET BY MOUTH EVERY 6 HOURS AS NEEDED (NAUSEA OR VOMITING)    . tetrahydrozoline (VISINE) 0.05 % ophthalmic solution Apply to eye as directed       .  traMADoL (ULTRAM) 50 mg tablet Take 50 mg by mouth every 8 (eight) hours as needed       . vilazodone (VIIBRYD) 20 mg tablet Take 20 mg by mouth once daily     No current facility-administered medications for this visit.           Family History  Problem Relation Age of Onset  . Depression Mother   . Lung cancer Father   . High blood pressure (Hypertension) Father   . Coronary Artery Disease (Blocked arteries around heart) Father   . Breast cancer Maternal Grandmother   . Ovarian cancer Paternal Aunt   . Bone cancer Maternal Grandfather   . Colon cancer Neg Hx           Objective:   Physical Exam Constitutional:      Appearance: Normal appearance.  Cardiovascular:     Rate and Rhythm: Normal rate and regular rhythm.     Pulses: Normal pulses.     Heart sounds: Normal heart sounds.  Pulmonary:     Effort: Pulmonary effort is normal.     Breath sounds: Normal breath sounds.  Chest:     Comments: No residual palpable mass in the breast.  No palpable axillary adenopathy. Musculoskeletal:     Cervical back: Neck supple.  Skin:    General: Skin is warm and dry.  Neurological:     Mental Status: She is alert and oriented to person, place, and time.  Psychiatric:        Mood and Affect: Mood normal.        Behavior: Behavior normal.     Labs and Radiology:   April 20, 2020:  WBC 4.0 - 10.5 K/uL 7.5   RBC 3.87 - 5.11 MIL/uL 4.14   Hemoglobin 12.0 - 15.0 g/dL 13.6   HCT 36 - 46 % 39.1   MCV 80.0 - 100.0 fL 94.4   MCH 26.0 - 34.0 pg 32.9   MCHC 30.0 - 36.0 g/dL 34.8   RDW 11.5 - 15.5 % 13.5   Platelets 150 - 400 K/uL 259   nRBC 0.0 -  0.2 % 0.0   Neutrophils Relative % % 74   Neutro Abs 1.7 - 7.7 K/uL 5.6   Lymphocytes Relative % 14   Lymphs Abs 0.7 - 4.0 K/uL 1.1   Monocytes Relative % 10   Monocytes Absolute 0.1 - 1.0 K/uL 0.7   Eosinophils Relative % 1   Eosinophils Absolute 0.0 - 0.5 K/uL 0.1   Basophils Relative % 1    Basophils Absolute 0.0 - 0.1 K/uL 0.1   Immature Granulocytes % 0   Abs Immature Granulocytes 0.00 - 0.07 K/uL 0.02   Sodium 135 - 145 mmol/L 137   Potassium 3.5 - 5.1 mmol/L 3.3Low   Chloride 98 - 111 mmol/L 103   CO2 22 - 32 mmol/L 24   Glucose, Bld 70 - 99 mg/dL 102High   Comment: Glucose reference range applies only to samples taken after fasting for at least 8 hours.  BUN 6 - 20 mg/dL <5Low   Creatinine, Ser 0.44 - 1.00 mg/dL 0.78   Calcium 8.9 - 10.3 mg/dL 9.1   Total Protein 6.5 - 8.1 g/dL 7.0   Albumin 3.5 - 5.0 g/dL 3.9   AST 15 - 41 U/L 19   ALT 0 - 44 U/L 11   Alkaline Phosphatase 38 - 126 U/L 91   Total Bilirubin 0.3 - 1.2 mg/dL 0.5   GFR calc non Af Amer >60 mL/min >60   GFR calc Af Amer >60 mL/min >60   Anion gap 5 - 15 10    November 10, 2019 biopsy results: DIAGNOSIS:  A. RIGHT BREAST, 10:00 2CMFN; ULTRASOUND-GUIDED NEEDLE CORE BIOPSY:  - INVASIVE MAMMARY CARCINOMA, NO SPECIAL TYPE.   Size of invasive carcinoma: 4 mm in this sample  Histologic grade of invasive carcinoma: Grade 3            Glandular/tubular differentiation score: 3            Nuclear pleomorphism score: 2            Mitotic rate score: 3            Total score: 8  Ductal carcinoma in situ: Not identified  Lymphovascular invasion: Not identified   Comment: Immunohistochemistry will be performed on block A1, with  reflex to [place] for HER2 2+. The results will be reported in an addendum.  The definitive grade will be assigned on the excisional specimen.   B. LYMPH NODE, RIGHT AXILLA; ULTRASOUND-GUIDED NEEDLE CORE BIOPSY:  - METASTATIC MAMMARY CARCINOMA, (AS DESCRIBED IN PART A), 7 MM IN THIS  SAMPLE.  At the conclusion of the procedure a HydroMARK tissue marker clip was deployed into the biopsy cavity.  - SEE COMMENT.   Comment:  Lymph node tissue is not identified. 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   C. LEFT BREAST, 2:00 3CMFN; ULTRASOUND-GUIDED NEEDLE CORE BIOPSY:  - FIBROADENOMA.  - NEGATIVE FOR ATYPIA AND MALIGNANCY.       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    Plan:     The patient will present to her PCP office today for advice regarding antianxiety medications.  Plans at this time are for right simple mastectomy and sentinel node biopsy with axillary dissection if required.  For postsurgical planning the patient is a 32 19 B.    Patient to have surgery as scheduled for 07-13-20  at [facility]. She has decided not to proceed with immediate reconstruction at this time.   Entered by Karie Fetch, RN, acting as a scribe for Dr. Hervey Ard, MD.  The documentation recorded by the scribe accurately reflects the service I personally performed and the decisions made by me.   Robert Bellow, MD FACS

## 2020-07-11 ENCOUNTER — Other Ambulatory Visit
Admission: RE | Admit: 2020-07-11 | Discharge: 2020-07-11 | Disposition: A | Discharge status: Home / Self Care | Payer: Medicaid Other | Source: Ambulatory Visit | Attending: General Surgery | Admitting: General Surgery

## 2020-07-11 ENCOUNTER — Other Ambulatory Visit: Payer: Self-pay

## 2020-07-11 DIAGNOSIS — Z01818 Encounter for other preprocedural examination: Secondary | ICD-10-CM | POA: Insufficient documentation

## 2020-07-11 DIAGNOSIS — Z20822 Contact with and (suspected) exposure to covid-19: Secondary | ICD-10-CM | POA: Diagnosis not present

## 2020-07-12 LAB — SARS CORONAVIRUS 2 (TAT 6-24 HRS): SARS Coronavirus 2: NEGATIVE

## 2020-07-13 ENCOUNTER — Encounter
Admission: RE | Admit: 2020-07-13 | Discharge: 2020-07-13 | Disposition: A | Discharge status: Home / Self Care | Payer: Medicaid Other | Source: Ambulatory Visit | Attending: General Surgery | Admitting: General Surgery

## 2020-07-13 ENCOUNTER — Ambulatory Visit: Payer: Medicaid Other | Admitting: Urgent Care

## 2020-07-13 ENCOUNTER — Encounter: Payer: Self-pay | Admitting: General Surgery

## 2020-07-13 ENCOUNTER — Other Ambulatory Visit: Payer: Self-pay

## 2020-07-13 ENCOUNTER — Ambulatory Visit: Payer: Medicaid Other

## 2020-07-13 ENCOUNTER — Encounter
Admission: RE | Disposition: A | Discharge status: Home / Self Care | Payer: Self-pay | Source: Home / Self Care | Attending: General Surgery

## 2020-07-13 ENCOUNTER — Ambulatory Visit
Admission: RE | Admit: 2020-07-13 | Discharge: 2020-07-13 | Disposition: A | Discharge status: Home / Self Care | Payer: Medicaid Other | Attending: General Surgery | Admitting: General Surgery

## 2020-07-13 DIAGNOSIS — Z9221 Personal history of antineoplastic chemotherapy: Secondary | ICD-10-CM | POA: Diagnosis not present

## 2020-07-13 DIAGNOSIS — Z170 Estrogen receptor positive status [ER+]: Secondary | ICD-10-CM | POA: Diagnosis not present

## 2020-07-13 DIAGNOSIS — Z853 Personal history of malignant neoplasm of breast: Secondary | ICD-10-CM | POA: Diagnosis not present

## 2020-07-13 DIAGNOSIS — Z79899 Other long term (current) drug therapy: Secondary | ICD-10-CM | POA: Insufficient documentation

## 2020-07-13 DIAGNOSIS — N6310 Unspecified lump in the right breast, unspecified quadrant: Secondary | ICD-10-CM

## 2020-07-13 DIAGNOSIS — Z8041 Family history of malignant neoplasm of ovary: Secondary | ICD-10-CM | POA: Insufficient documentation

## 2020-07-13 DIAGNOSIS — C50411 Malignant neoplasm of upper-outer quadrant of right female breast: Secondary | ICD-10-CM

## 2020-07-13 DIAGNOSIS — F17210 Nicotine dependence, cigarettes, uncomplicated: Secondary | ICD-10-CM | POA: Diagnosis not present

## 2020-07-13 DIAGNOSIS — C50412 Malignant neoplasm of upper-outer quadrant of left female breast: Secondary | ICD-10-CM | POA: Insufficient documentation

## 2020-07-13 DIAGNOSIS — Z923 Personal history of irradiation: Secondary | ICD-10-CM | POA: Diagnosis not present

## 2020-07-13 HISTORY — PX: MASTECTOMY: SHX3

## 2020-07-13 HISTORY — PX: SIMPLE MASTECTOMY WITH AXILLARY SENTINEL NODE BIOPSY: SHX6098

## 2020-07-13 LAB — URINE DRUG SCREEN, QUALITATIVE (ARMC ONLY)
Amphetamines, Ur Screen: NOT DETECTED
Barbiturates, Ur Screen: NOT DETECTED
Benzodiazepine, Ur Scrn: POSITIVE — AB
Cannabinoid 50 Ng, Ur ~~LOC~~: POSITIVE — AB
Cocaine Metabolite,Ur ~~LOC~~: NOT DETECTED
MDMA (Ecstasy)Ur Screen: NOT DETECTED
Methadone Scn, Ur: NOT DETECTED
Opiate, Ur Screen: NOT DETECTED
Phencyclidine (PCP) Ur S: NOT DETECTED
Tricyclic, Ur Screen: NOT DETECTED

## 2020-07-13 SURGERY — SIMPLE MASTECTOMY WITH AXILLARY SENTINEL NODE BIOPSY
Anesthesia: General | Laterality: Right

## 2020-07-13 MED ORDER — CHLORHEXIDINE GLUCONATE CLOTH 2 % EX PADS
6.0000 | MEDICATED_PAD | Freq: Once | CUTANEOUS | Status: AC
  Administered 2020-07-13: 6 via TOPICAL

## 2020-07-13 MED ORDER — PROMETHAZINE HCL 25 MG/ML IJ SOLN: 6.2500 mg | INTRAMUSCULAR | Status: DC | PRN

## 2020-07-13 MED ORDER — DROPERIDOL 2.5 MG/ML IJ SOLN
0.6250 mg | Freq: Once | INTRAMUSCULAR | Status: DC | PRN
  Filled 2020-07-13: qty 2

## 2020-07-13 MED ORDER — OXYCODONE HCL 5 MG PO TABS
ORAL_TABLET | ORAL | Status: AC
  Filled 2020-07-13: qty 1

## 2020-07-13 MED ORDER — SCOPOLAMINE 1 MG/3DAYS TD PT72: 1.0000 | MEDICATED_PATCH | TRANSDERMAL | Status: DC

## 2020-07-13 MED ORDER — CEFAZOLIN SODIUM-DEXTROSE 2-4 GM/100ML-% IV SOLN
2.0000 g | INTRAVENOUS | Status: AC
  Administered 2020-07-13: 2 g via INTRAVENOUS

## 2020-07-13 MED ORDER — LABETALOL HCL 5 MG/ML IV SOLN
INTRAVENOUS | Status: DC | PRN
  Administered 2020-07-13 (×2): 10 mg via INTRAVENOUS

## 2020-07-13 MED ORDER — HYDROMORPHONE HCL 1 MG/ML IJ SOLN
INTRAMUSCULAR | Status: AC
  Filled 2020-07-13: qty 1

## 2020-07-13 MED ORDER — LIDOCAINE HCL (CARDIAC) PF 100 MG/5ML IV SOSY
PREFILLED_SYRINGE | INTRAVENOUS | Status: DC | PRN
  Administered 2020-07-13: 50 mg via INTRAVENOUS

## 2020-07-13 MED ORDER — EPHEDRINE SULFATE 50 MG/ML IJ SOLN
INTRAMUSCULAR | Status: DC | PRN
  Administered 2020-07-13: 10 mg via INTRAVENOUS

## 2020-07-13 MED ORDER — TECHNETIUM TC 99M TILMANOCEPT KIT
1.0190 | PACK | Freq: Once | INTRAVENOUS | Status: AC | PRN
  Administered 2020-07-13: 1.019 via INTRADERMAL

## 2020-07-13 MED ORDER — PROPOFOL 10 MG/ML IV BOLUS
INTRAVENOUS | Status: AC
  Filled 2020-07-13: qty 20

## 2020-07-13 MED ORDER — ACETAMINOPHEN 10 MG/ML IV SOLN
INTRAVENOUS | Status: AC
  Filled 2020-07-13: qty 100

## 2020-07-13 MED ORDER — DEXAMETHASONE SODIUM PHOSPHATE 10 MG/ML IJ SOLN
INTRAMUSCULAR | Status: DC | PRN
  Administered 2020-07-13: 10 mg via INTRAVENOUS

## 2020-07-13 MED ORDER — LORAZEPAM 2 MG/ML IJ SOLN: 1.0000 mg | Freq: Once | INTRAMUSCULAR | Status: DC | PRN

## 2020-07-13 MED ORDER — FAMOTIDINE 20 MG PO TABS
ORAL_TABLET | ORAL | Status: AC
  Administered 2020-07-13: 20 mg via ORAL
  Filled 2020-07-13: qty 1

## 2020-07-13 MED ORDER — CHLORHEXIDINE GLUCONATE 0.12 % MT SOLN: 15.0000 mL | Freq: Once | OROMUCOSAL | Status: AC

## 2020-07-13 MED ORDER — OXYCODONE HCL 5 MG PO TABS
5.0000 mg | ORAL_TABLET | Freq: Once | ORAL | Status: AC | PRN
  Administered 2020-07-13: 5 mg via ORAL

## 2020-07-13 MED ORDER — ORAL CARE MOUTH RINSE: 15.0000 mL | Freq: Once | OROMUCOSAL | Status: AC

## 2020-07-13 MED ORDER — FAMOTIDINE 20 MG PO TABS: 20.0000 mg | ORAL_TABLET | Freq: Once | ORAL | Status: AC

## 2020-07-13 MED ORDER — OXYCODONE HCL 5 MG/5ML PO SOLN: 5.0000 mg | Freq: Once | ORAL | Status: AC | PRN

## 2020-07-13 MED ORDER — FENTANYL CITRATE (PF) 250 MCG/5ML IJ SOLN
INTRAMUSCULAR | Status: AC
  Filled 2020-07-13: qty 5

## 2020-07-13 MED ORDER — FENTANYL CITRATE (PF) 100 MCG/2ML IJ SOLN
INTRAMUSCULAR | Status: DC | PRN
  Administered 2020-07-13 (×2): 100 ug via INTRAVENOUS
  Administered 2020-07-13: 50 ug via INTRAVENOUS

## 2020-07-13 MED ORDER — HYDROMORPHONE HCL 1 MG/ML IJ SOLN
0.2500 mg | INTRAMUSCULAR | Status: DC | PRN
  Administered 2020-07-13 (×2): 0.5 mg via INTRAVENOUS

## 2020-07-13 MED ORDER — KETOROLAC TROMETHAMINE 30 MG/ML IJ SOLN
INTRAMUSCULAR | Status: AC
  Filled 2020-07-13: qty 1

## 2020-07-13 MED ORDER — LABETALOL HCL 5 MG/ML IV SOLN
INTRAVENOUS | Status: AC
  Filled 2020-07-13: qty 4

## 2020-07-13 MED ORDER — DEXAMETHASONE SODIUM PHOSPHATE 10 MG/ML IJ SOLN
INTRAMUSCULAR | Status: AC
  Filled 2020-07-13: qty 1

## 2020-07-13 MED ORDER — MIDAZOLAM HCL 2 MG/2ML IJ SOLN
INTRAMUSCULAR | Status: DC | PRN
  Administered 2020-07-13: 2 mg via INTRAVENOUS

## 2020-07-13 MED ORDER — CHLORHEXIDINE GLUCONATE 0.12 % MT SOLN
OROMUCOSAL | Status: AC
  Administered 2020-07-13: 15 mL via OROMUCOSAL
  Filled 2020-07-13: qty 15

## 2020-07-13 MED ORDER — BUPIVACAINE-EPINEPHRINE (PF) 0.5% -1:200000 IJ SOLN
INTRAMUSCULAR | Status: AC
  Filled 2020-07-13: qty 30

## 2020-07-13 MED ORDER — MIDAZOLAM HCL 2 MG/2ML IJ SOLN
INTRAMUSCULAR | Status: AC
  Filled 2020-07-13: qty 2

## 2020-07-13 MED ORDER — HYDROCODONE-ACETAMINOPHEN 5-325 MG PO TABS: 1.0000 | ORAL_TABLET | ORAL | 0 refills | Status: DC | PRN

## 2020-07-13 MED ORDER — METHYLENE BLUE 0.5 % INJ SOLN
INTRAVENOUS | Status: DC | PRN
  Administered 2020-07-13: 5 mL via SUBMUCOSAL

## 2020-07-13 MED ORDER — KETOROLAC TROMETHAMINE 30 MG/ML IJ SOLN
INTRAMUSCULAR | Status: DC | PRN
  Administered 2020-07-13: 30 mg via INTRAVENOUS

## 2020-07-13 MED ORDER — ONDANSETRON HCL 4 MG/2ML IJ SOLN
INTRAMUSCULAR | Status: AC
  Filled 2020-07-13: qty 2

## 2020-07-13 MED ORDER — SCOPOLAMINE 1 MG/3DAYS TD PT72
MEDICATED_PATCH | TRANSDERMAL | Status: AC
  Administered 2020-07-13: 1.5 mg via TRANSDERMAL
  Filled 2020-07-13: qty 1

## 2020-07-13 MED ORDER — LACTATED RINGERS IV SOLN: INTRAVENOUS | Status: DC

## 2020-07-13 MED ORDER — MEPERIDINE HCL 50 MG/ML IJ SOLN: 6.2500 mg | INTRAMUSCULAR | Status: DC | PRN

## 2020-07-13 MED ORDER — LIDOCAINE HCL (PF) 2 % IJ SOLN
INTRAMUSCULAR | Status: AC
  Filled 2020-07-13: qty 5

## 2020-07-13 MED ORDER — PROPOFOL 10 MG/ML IV BOLUS
INTRAVENOUS | Status: DC | PRN
  Administered 2020-07-13: 150 mg via INTRAVENOUS

## 2020-07-13 MED ORDER — ACETAMINOPHEN 10 MG/ML IV SOLN
INTRAVENOUS | Status: DC | PRN
  Administered 2020-07-13: 1000 mg via INTRAVENOUS

## 2020-07-13 MED ORDER — CEFAZOLIN SODIUM-DEXTROSE 2-4 GM/100ML-% IV SOLN
INTRAVENOUS | Status: AC
  Filled 2020-07-13: qty 100

## 2020-07-13 MED ORDER — EPHEDRINE 5 MG/ML INJ
INTRAVENOUS | Status: AC
  Filled 2020-07-13: qty 10

## 2020-07-13 SURGICAL SUPPLY — 55 items
APL PRP STRL LF DISP 70% ISPRP (MISCELLANEOUS) ×1
APPLIER CLIP 11 MED OPEN (CLIP)
APPLIER CLIP 13 LRG OPEN (CLIP)
APR CLP LRG 13 20 CLIP (CLIP)
APR CLP MED 11 20 MLT OPN (CLIP)
BINDER BREAST MEDIUM (GAUZE/BANDAGES/DRESSINGS) ×1 IMPLANT
BLADE PHOTON ILLUMINATED (MISCELLANEOUS) ×1 IMPLANT
BLADE SURG 15 STRL SS SAFETY (BLADE) ×2 IMPLANT
BULB RESERV EVAC DRAIN JP 100C (MISCELLANEOUS) ×1 IMPLANT
CANISTER SUCT 1200ML W/VALVE (MISCELLANEOUS) ×2 IMPLANT
CHLORAPREP W/TINT 26 (MISCELLANEOUS) ×2 IMPLANT
CLIP APPLIE 11 MED OPEN (CLIP) IMPLANT
CLIP APPLIE 13 LRG OPEN (CLIP) IMPLANT
CNTNR SPEC 2.5X3XGRAD LEK (MISCELLANEOUS) ×3
CONT SPEC 4OZ STER OR WHT (MISCELLANEOUS) ×3
CONT SPEC 4OZ STRL OR WHT (MISCELLANEOUS) ×3
CONTAINER SPEC 2.5X3XGRAD LEK (MISCELLANEOUS) ×3 IMPLANT
COVER PROBE FLX POLY STRL (MISCELLANEOUS) ×1 IMPLANT
COVER WAND RF STERILE (DRAPES) ×2 IMPLANT
DRAIN CHANNEL JP 15F RND 16 (MISCELLANEOUS) ×1 IMPLANT
DRAPE LAPAROTOMY TRNSV 106X77 (MISCELLANEOUS) ×2 IMPLANT
DRSG GAUZE FLUFF 36X18 (GAUZE/BANDAGES/DRESSINGS) ×2 IMPLANT
DRSG TELFA 3X8 NADH (GAUZE/BANDAGES/DRESSINGS) ×2 IMPLANT
ELECT CAUTERY BLADE TIP 2.5 (TIP) ×2
ELECT REM PT RETURN 9FT ADLT (ELECTROSURGICAL) ×2
ELECTRODE CAUTERY BLDE TIP 2.5 (TIP) ×1 IMPLANT
ELECTRODE REM PT RTRN 9FT ADLT (ELECTROSURGICAL) ×1 IMPLANT
GLOVE BIO SURGEON STRL SZ7.5 (GLOVE) ×2 IMPLANT
GLOVE INDICATOR 8.0 STRL GRN (GLOVE) ×2 IMPLANT
GOWN STRL REUS W/ TWL LRG LVL3 (GOWN DISPOSABLE) ×2 IMPLANT
GOWN STRL REUS W/TWL LRG LVL3 (GOWN DISPOSABLE) ×4
LABEL OR SOLS (LABEL) ×2 IMPLANT
MANIFOLD NEPTUNE II (INSTRUMENTS) ×2 IMPLANT
PACK BASIN MINOR (MISCELLANEOUS) ×2 IMPLANT
PAD DRESSING TELFA 3X8 NADH (GAUZE/BANDAGES/DRESSINGS) ×1 IMPLANT
PIN SAFETY STRL (MISCELLANEOUS) ×1 IMPLANT
RETRACTOR RING XSMALL (MISCELLANEOUS) ×1 IMPLANT
RTRCTR WOUND ALEXIS 13CM XS SH (MISCELLANEOUS) ×2
SHEARS FOC LG CVD HARMONIC 17C (MISCELLANEOUS) IMPLANT
SLEVE PROBE SENORX GAMMA FIND (MISCELLANEOUS) ×2 IMPLANT
SPONGE LAP 18X18 RF (DISPOSABLE) ×2 IMPLANT
STRIP CLOSURE SKIN 1/2X4 (GAUZE/BANDAGES/DRESSINGS) ×4 IMPLANT
SUT ETHILON 3-0 FS-10 30 BLK (SUTURE) ×2
SUT SILK 2 0 (SUTURE) ×2
SUT SILK 2-0 30XBRD TIE 12 (SUTURE) ×1 IMPLANT
SUT SILK 3 0 (SUTURE)
SUT SILK 3-0 18XBRD TIE 12 (SUTURE) ×1 IMPLANT
SUT VIC AB 2-0 CT1 27 (SUTURE) ×8
SUT VIC AB 2-0 CT1 TAPERPNT 27 (SUTURE) ×4 IMPLANT
SUT VIC AB 3-0 SH 27 (SUTURE) ×2
SUT VIC AB 3-0 SH 27X BRD (SUTURE) ×1 IMPLANT
SUT VICRYL+ 3-0 144IN (SUTURE) ×2 IMPLANT
SUTURE EHLN 3-0 FS-10 30 BLK (SUTURE) ×1 IMPLANT
SWABSTK COMLB BENZOIN TINCTURE (MISCELLANEOUS) ×2 IMPLANT
TAPE TRANSPORE STRL 2 31045 (GAUZE/BANDAGES/DRESSINGS) ×2 IMPLANT

## 2020-07-13 NOTE — Anesthesia Postprocedure Evaluation (Signed)
Anesthesia Post Note  Patient: Kelsey Russo  Procedure(s) Performed: SIMPLE MASTECTOMY WITH AXILLARY SENTINEL NODE BIOPSY (Right )  Patient location during evaluation: PACU Anesthesia Type: General Level of consciousness: awake Pain management: pain level controlled Vital Signs Assessment: post-procedure vital signs reviewed and stable Respiratory status: spontaneous breathing Cardiovascular status: blood pressure returned to baseline Postop Assessment: no apparent nausea or vomiting Anesthetic complications: no   No complications documented.   Last Vitals:  Vitals:   07/13/20 1441 07/13/20 1445  BP: 135/88   Pulse: 73 79  Resp: 11 14  Temp: 36.6 C   SpO2: 100% 100%    Last Pain:  Vitals:   07/13/20 1445  TempSrc:   PainSc: 0-No pain                 Neva Seat

## 2020-07-13 NOTE — H&P (Signed)
Kelsey Russo [id number] 1966/01/30     HPI:  54 y/o with new right breast cancer, s/p prior breast conservation surgery.  Has tolerated neo-adjuvant treatment well.  Elected to defer reconstruction at this time.   Medications Prior to Admission  Medication Sig Dispense Refill Last Dose  . acetaminophen (TYLENOL) 500 MG tablet Take 500-1,000 mg by mouth every 6 (six) hours as needed for moderate pain.    07/13/2020 at Unknown time  . albuterol (VENTOLIN HFA) 108 (90 Base) MCG/ACT inhaler Inhale 1-2 puffs into the lungs every 6 (six) hours as needed for wheezing or shortness of breath.   07/13/2020 at Unknown time  . clonazePAM (KLONOPIN) 1 MG tablet Take 1 mg by mouth 3 (three) times daily as needed for anxiety.   07/13/2020 at Unknown time  . lidocaine-prilocaine (EMLA) cream Apply to affected area once (Patient taking differently: Apply 1 application topically daily as needed (port access). ) 30 g 3 07/13/2020 at Unknown time  . nicotine (NICODERM CQ - DOSED IN MG/24 HOURS) 14 mg/24hr patch Place 14 mg onto the skin daily.   07/12/2020 at Unknown time  . tetrahydrozoline 0.05 % ophthalmic solution Place 1-2 drops into both eyes 3 (three) times daily as needed (dry/irritated eyes).   Past Month at Unknown time  . traZODone (DESYREL) 100 MG tablet Take 100 mg by mouth at bedtime.   07/12/2020 at Unknown time  . ALPRAZolam (XANAX) 0.25 MG tablet Take 1 tablet (0.25 mg total) by mouth at bedtime as needed for anxiety. (Patient not taking: Reported on 07/13/2020) 30 tablet 0 Not Taking at Unknown time  . amLODipine (NORVASC) 5 MG tablet Take 1 tablet (5 mg total) by mouth daily. (Patient not taking: Reported on 05/27/2020) 30 tablet 6   . ciprofloxacin (CIPRO) 500 MG tablet Take 1 tablet (500 mg total) by mouth 2 (two) times daily. (Patient not taking: Reported on 07/13/2020) 6 tablet 0 Not Taking at Unknown time  . diphenoxylate-atropine (LOMOTIL) 2.5-0.025 MG tablet TAKE 1 TABLET BY MOUTH 4 TIMES  DAILY AS NEEDED FOR DIARRHEA OR LOOSE STOOLS (Patient not taking: Reported on 06/29/2020) 30 tablet 1 Not Taking at Unknown time  . ondansetron (ZOFRAN) 4 MG tablet Take 1 tablet (4 mg total) by mouth every 8 (eight) hours as needed for nausea or vomiting. (Patient not taking: Reported on 07/13/2020) 20 tablet 0 Not Taking at Unknown time  . ondansetron (ZOFRAN) 8 MG tablet TAKE 1 TABLET BY MOUTH 2 TIMES DAILY AS NEEDED (Patient not taking: Reported on 06/29/2020) 60 tablet 2 Not Taking at Unknown time  . potassium chloride SA (KLOR-CON) 20 MEQ tablet Take 1 tablet (20 mEq total) by mouth 2 (two) times daily. (Patient not taking: Reported on 06/29/2020) 30 tablet 1 Not Taking at Unknown time  . prochlorperazine (COMPAZINE) 10 MG tablet TAKE 1 TABLET BY MOUTH EVERY 6 HOURS AS NEEDED (NAUSEA OR VOMITING) (Patient not taking: Reported on 06/29/2020) 60 tablet 2 Not Taking at Unknown time  . Pseudoeph-Doxylamine-DM-APAP (NYQUIL PO) Take 1-2 capsules by mouth at bedtime as needed (sleep.).  (Patient not taking: Reported on 05/27/2020)     . traMADol (ULTRAM) 50 MG tablet Take 1 tablet (50 mg total) by mouth 2 (two) times daily. (Patient not taking: Reported on 06/29/2020) 60 tablet 0 Not Taking at Unknown time   Allergies  Allergen Reactions  . Imitrex [Sumatriptan] Anaphylaxis  . Penicillins Rash    Did it involve swelling of the face/tongue/throat, SOB, or low BP? Unknown  Did it involve sudden or severe rash/hives, skin peeling, or any reaction on the inside of your mouth or nose? Unknown Did you need to seek medical attention at a hospital or doctor's office? Unknown When did it last happen?? If all above answers are "NO", may proceed with cephalosporin use.    Past Medical History:  Diagnosis Date  . Anemia    during chemotherapy  . Brain tumor (benign) ([place])   . Breast cancer ([place]) 1999   Status post right breast lumpectomy and radiation  . COPD (chronic obstructive pulmonary disease) ([place])    . Depression   . History of chemotherapy 1999  . History of seizure disorder   . Hypertension   . Personal history of radiation therapy 1999  . Pneumonia   . PONV (postoperative nausea and vomiting)    vomiting   Past Surgical History:  Procedure Laterality Date  . BRAIN SURGERY    . Brain tumor resection  1991  . BREAST BIOPSY Right 1999   Positive  . BREAST BIOPSY Right 11/10/2019   [place] bx 10:00 heart marker, path pending  . BREAST BIOPSY Right 11/10/2019   [place] bx axilla, hydromark 4, path pending  . BREAST BIOPSY Left 11/10/2019   [place] bx 2:00, venus marker, path pending  . PORTACATH PLACEMENT Left 11/30/2019   Procedure: INSERTION PORT-A-CATH;  Surgeon: Robert Bellow, MD;  Location: [facility] ORS;  Service: General;  Laterality: Left;  . Right breast lumpectomy    . TONSILLECTOMY    . TUBAL LIGATION     Social History   Socioeconomic History  . Marital status: Married    Spouse name: Not on file  . Number of children: Not on file  . Years of education: Not on file  . Highest education level: Not on file  Occupational History  . Not on file  Tobacco Use  . Smoking status: Current Every Day Smoker    Packs/day: 0.25    Years: 40.00    Pack years: 10.00    Types: Cigarettes  . Smokeless tobacco: Never Used  Vaping Use  . Vaping Use: Never used  Substance and Sexual Activity  . Alcohol use: No    Alcohol/week: 0.0 standard drinks  . Drug use: Yes    Frequency: 3.0 times per week    Types: Marijuana  . Sexual activity: Not on file  Other Topics Concern  . Not on file  Social History Narrative   Lives at home with husband.   Social Determinants of Health   Financial Resource Strain:   . Difficulty of Paying Living Expenses: Not on file  Food Insecurity:   . Worried About Charity fundraiser in the Last Year: Not on file  . Ran Out of Food in the Last Year: Not on file  Transportation Needs:   . Lack of Transportation (Medical): Not on file  . Lack of  Transportation (Non-Medical): Not on file  Physical Activity:   . Days of Exercise per Week: Not on file  . Minutes of Exercise per Session: Not on file  Stress:   . Feeling of Stress : Not on file  Social Connections:   . Frequency of Communication with Friends and Family: Not on file  . Frequency of Social Gatherings with Friends and Family: Not on file  . Attends Religious Services: Not on file  . Active Member of Clubs or Organizations: Not on file  . Attends Archivist Meetings: Not on file  .  Marital Status: Not on file  Intimate Partner Violence:   . Fear of Current or Ex-Partner: Not on file  . Emotionally Abused: Not on file  . Physically Abused: Not on file  . Sexually Abused: Not on file   Social History   Social History Narrative   Lives at home with husband.     ROS: Negative.     PE: HEENT: Negative. Lungs: Clear. Cardio: RR.  Assessment/Plan:  Proceed with planned right mastectomy and SLN biopsy, axillary dissection if indicated.    Forest Gleason Roseana Rhine 07/13/2020

## 2020-07-13 NOTE — Op Note (Addendum)
Preoperative diagnosis: Right breast cancer, second primary.  Postoperative diagnosis: Same.  Status post neoadjuvant chemotherapy.  Operative procedure: Right simple mastectomy with ultrasound examination of the axilla and sentinel node biopsy.  Operating surgeon: Hervey Ard, MD.  Anesthesia: General by LMA.  Estimated blood loss: Less than 30 cc.  Clinical note: This 54 year old woman has undergone neoadjuvant chemotherapy with an excellent clinical response.  She had had a previous hydro-Mark clip placed in the axilla 8 months ago.  She is brought to the operating for planned mastectomy.  Last week at preop visit she decided she did not want to proceed with reconstruction at this time.  She received Ancef prior to the procedure.  SCD stockings for DVT prevention.  Operative note: The patient underwent general anesthesia and tolerated this well.  She had previously been injected with Lymphoseek prior to presentation of the operative theater.  After skin cleansing with alcohol 5 cc of 0.5% methylene blue was instilled in the subareolar plexus.  The breast was then cleansed with ChloraPrep and draped.  An elliptical incision was outlined.  The skin was incised sharply and the remaining dissection completed with the photon blade  Flaps were elevated to the clavicle superiorly, sternum medially, rectus fascia inferiorly and the serratus fascia laterally.  Prior to skin incision ultrasound interrogation of the axilla had been undertaken and it appeared there might be a residual clip high in the axilla about 1 fingerbreadth below the axillary vein. An image was printed for admission to the permanent record.   The axillary envelope was opened and the for sentinel node which was about 5 mm in diameter had a count of 2500.  Touch preps were reported as negative.  The remaining axilla was opened and a single node with increased uptake of about 250 was identified near the apex of the axilla.  There was no  evidence of residual hydro-Mark material.  Palpation through the axilla showed no other areas of concern and no other palpable nodes.  The second node also reported negative on touch prep.  The breast was elevated off the underlying pectoralis muscle taking the fascia of that muscle with the specimen.  This was then sent fresh to pathology per protocol.  The wound was irrigated.  A 15 [place] Blake drain was brought out through the lower medial flap and anchored in place with a 3-0 nylon.  The flaps were then approximated with a running 2-0 Vicryl suture in 2 segments.  Benzoin Steri-Strips were applied followed by Telfa, fluff gauze and a compressive wrap.  The patient tolerated the procedure well.  In the recovery room she reported numbness over the right shoulder but normal motor function was evident.  This may have been from a subpectoral roll placed at the time of surgery even though the upper arm had been supported on a blanket.  This will be observed.

## 2020-07-13 NOTE — Discharge Instructions (Addendum)
[facility] Care Surgical drains are used to remove extra fluid that normally builds up in a surgical wound after surgery. A surgical drain helps to heal a surgical wound. Different kinds of surgical drains include:  Active drains. These drains use suction to pull drainage away from the surgical wound. Drainage flows through a tube to a container outside of the body. With these drains, you need to keep the bulb or the drainage container flat (compressed) at all times, except while you empty it. Flattening the bulb or container creates suction.  Passive drains. These drains allow fluid to drain naturally, by gravity. Drainage flows through a tube to a bandage (dressing) or a container outside of the body. Passive drains do not need to be emptied. A drain is placed during surgery. Right after surgery, drainage is usually bright red and a little thicker than water. The drainage may gradually turn yellow or pink and become thinner. It is likely that your health care provider will remove the drain when the drainage stops or when the amount decreases to 1-2 Tbsp (15-30 mL) during a 24-hour period. Supplies needed:  Tape.  Germ-free cleaning solution (sterile saline).  Cotton swabs.  Split gauze drain sponge: 4 x 4 inches (10 x 10 cm).  Gauze square: 4 x 4 inches (10 x 10 cm). How to care for your surgical drain Care for your drain as told by your health care provider. This is important to help prevent infection. If your drain is placed at your back, or any other hard-to-reach area, ask another person to assist you in performing the following tasks: General care  Keep the skin around the drain dry and covered with a dressing at all times.  Check your drain area every day for signs of infection. Check for: ? Redness, swelling, or pain. ? Pus or a bad smell. ? Cloudy drainage. ? Tenderness or pressure at the drain exit site. Changing the dressing Follow instructions from your health care  provider about how to change your dressing. Change your dressing at least once a day. Change it more often if needed to keep the dressing dry. Make sure you: 1. Gather your supplies. 2. Wash your hands with soap and water before you change your dressing. If soap and water are not available, use hand sanitizer. 3. Remove the old dressing. Avoid using scissors to do that. 4. Wash your hands with soap and water again after removing the old dressing. 5. Use sterile saline to clean your skin around the drain. You may need to use a cotton swab to clean the skin. 6. Place the tube through the slit in a drain sponge. Place the drain sponge so that it covers your wound. 7. Place the gauze square or another drain sponge on top of the drain sponge that is on the wound. Make sure the tube is between those layers. 8. Tape the dressing to your skin. 9. Tape the drainage tube to your skin 1-2 inches (2.5-5 cm) below the place where the tube enters your body. Taping keeps the tube from pulling on any stitches (sutures) that you have. 10. Wash your hands with soap and water. 11. Write down the color of your drainage and how often you change your dressing. How to empty your active drain  1. Make sure that you have a measuring cup that you can empty your drainage into. 2. Wash your hands with soap and water. If soap and water are not available, use hand sanitizer. 3.  Loosen any pins or clips that hold the tube in place. 4. If your health care provider tells you to strip the tube to prevent clots and tube blockages: ? Hold the tube at the skin with one hand. Use your other hand to pinch the tubing with your thumb and first finger. ? Gently move your fingers down the tube while squeezing very lightly. This clears any drainage, clots, or tissue from the tube. ? You may need to do this several times each day to keep the tube clear. Do not pull on the tube. 5. Open the bulb cap or the drain plug. Do not touch the  inside of the cap or the bottom of the plug. 6. Turn the device upside down and gently squeeze. 7. Empty all of the drainage into the measuring cup. 8. Compress the bulb or the container and replace the cap or the plug. To compress the bulb or the container, squeeze it firmly in the middle while you close the cap or plug the container. 9. Write down the amount of drainage that you have in each 24-hour period. If you have less than 2 Tbsp (30 mL) of drainage during 24 hours, contact your health care provider. 10. Flush the drainage down the toilet. 11. Wash your hands with soap and water. Contact a health care provider if:  You have redness, swelling, or pain around your drain area.  You have pus or a bad smell coming from your drain area.  You have a fever or chills.  The skin around your drain is warm to the touch.  The amount of drainage that you have is increasing instead of decreasing.  You have drainage that is cloudy.  There is a sudden stop or a sudden decrease in the amount of drainage that you have.  Your drain tube falls out.  Your active drain does not stay compressed after you empty it. Summary  Surgical drains are used to remove extra fluid that normally builds up in a surgical wound after surgery.  Different kinds of surgical drains include active drains and passive drains. Active drains use suction to pull drainage away from the surgical wound, and passive drains allow fluid to drain naturally.  It is important to care for your drain to prevent infection. If your drain is placed at your back, or any other hard-to-reach area, ask another person to assist you.  Contact your health care provider if you have redness, swelling, or pain around your drain area. This information is not intended to replace advice given to you by your health care provider. Make sure you discuss any questions you have with your health care provider. Document Revised: 09/24/2018 Document  Reviewed: 09/24/2018 Elsevier Patient Education  2020 [place] Record Empty your surgical drain as told by your health care provider. Use this form to write down the amount of fluid that has collected in the drainage container. Bring this form with you to your follow-up visits. Surgical drain #1 location: ___________________  Date __________ Time __________ Amount __________ Date __________ Time __________ Amount __________ Date __________ Time __________ Amount __________ Date __________ Time __________ Amount __________ Date __________ Time __________ Amount __________ Date __________ Time __________ Amount __________ Date __________ Time __________ Amount __________ Date __________ Time __________ Amount __________ Date __________ Time __________ Amount __________ Date __________ Time __________ Amount __________ Date __________ Time __________ Amount __________ Date __________ Time __________ Amount __________ Date __________ Time __________ Amount __________ Date __________  Time __________ Amount __________ Date __________ Time __________ Amount __________ Date __________ Time __________ Amount __________ Date __________ Time __________ Amount __________ Date __________ Time __________ Amount __________ Date __________ Time __________ Amount __________ Date __________ Time __________ Amount __________ Date __________ Time __________ Amount __________ Surgical drain #2 location: ___________________ Date __________ Time __________ Amount __________ Date __________ Time __________ Amount __________ Date __________ Time __________ Amount __________ Date __________ Time __________ Amount __________ Date __________ Time __________ Amount __________ Date __________ Time __________ Amount __________ Date __________ Time __________ Amount __________ Date __________ Time __________ Amount __________ Date __________ Time __________ Amount __________ Date  __________ Time __________ Amount __________ Date __________ Time __________ Amount __________ Date __________ Time __________ Amount __________ Date __________ Time __________ Amount __________ Date __________ Time __________ Amount __________ Date __________ Time __________ Amount __________ Date __________ Time __________ Amount __________ Date __________ Time __________ Amount __________ Date __________ Time __________ Amount __________ Date __________ Time __________ Amount __________ Date __________ Time __________ Amount __________ Date __________ Time __________ Amount __________ This information is not intended to replace advice given to you by your health care provider. Make sure you discuss any questions you have with your health care provider. Document Revised: 05/27/2017 Document Reviewed: 05/27/2017 Elsevier Patient Education  2020 [place]   1) The drugs that you were given will stay in your system until tomorrow so for the next 24 hours you should not:  A) Drive an automobile B) Make any legal decisions C) Drink any alcoholic beverage   2) You may resume regular meals tomorrow.  Today it is better to start with liquids and gradually work up to solid foods.  You may eat anything you prefer, but it is better to start with liquids, then soup and crackers, and gradually work up to solid foods.   3) Please notify your doctor immediately if you have any unusual bleeding, trouble breathing, redness and pain at the surgery site, drainage, fever, or pain not relieved by medication.    4) Additional Instructions:        Please contact your physician with any problems or Same Day Surgery at [phone], Monday through Friday 6 am to 4 pm, or [place] [facility] number at [phone].

## 2020-07-13 NOTE — OR Nursing (Signed)
Per Dr. Bary Castilla, may discharge pt to home without his visit to postop.

## 2020-07-13 NOTE — Anesthesia Procedure Notes (Signed)
Procedure Name: LMA Insertion Performed by: Jerrye Noble, CRNA Pre-anesthesia Checklist: Patient identified, Emergency Drugs available, Suction available and Patient being monitored Patient Re-evaluated:Patient Re-evaluated prior to induction Oxygen Delivery Method: Circle system utilized Preoxygenation: Pre-oxygenation with 100% oxygen Induction Type: IV induction LMA: LMA inserted LMA Size: 4.0 Tube type: Oral Number of attempts: 1 Placement Confirmation: positive ETCO2 and breath sounds checked- equal and bilateral Dental Injury: Teeth and Oropharynx as per pre-operative assessment

## 2020-07-13 NOTE — Transfer of Care (Signed)
Immediate Anesthesia Transfer of Care Note  Patient: Kelsey Russo  Procedure(s) Performed: SIMPLE MASTECTOMY WITH AXILLARY SENTINEL NODE BIOPSY (Right )  Patient Location: PACU  Anesthesia Type:General  Level of Consciousness: sedated  Airway & Oxygen Therapy: Patient Spontanous Breathing and Patient connected to face mask oxygen  Post-op Assessment: Report given to RN and Post -op Vital signs reviewed and stable  Post vital signs: Reviewed and stable  Last Vitals:  Vitals Value Taken Time  BP 135/88 07/13/20 1441  Temp 36.6 C 07/13/20 1441  Pulse 79 07/13/20 1444  Resp 14 07/13/20 1444  SpO2 100 % 07/13/20 1444  Vitals shown include unvalidated device data.  Last Pain:  Vitals:   07/13/20 1441  TempSrc:   PainSc: Asleep         Complications: No complications documented.

## 2020-07-13 NOTE — Anesthesia Preprocedure Evaluation (Signed)
Anesthesia Evaluation  Patient identified by MRN, date of birth, ID band Patient awake    Reviewed: Allergy & Precautions, NPO status , Patient's Chart, lab work & pertinent test results  History of Anesthesia Complications (+) PONV and history of anesthetic complications  Airway Mallampati: II       Dental no notable dental hx.    Pulmonary neg pulmonary ROS, Current Smoker and Patient abstained from smoking.,    Pulmonary exam normal breath sounds clear to auscultation       Cardiovascular hypertension, negative cardio ROS Normal cardiovascular exam Rhythm:Regular Rate:Normal     Neuro/Psych PSYCHIATRIC DISORDERS Depression negative neurological ROS     GI/Hepatic negative GI ROS, Neg liver ROS,   Endo/Other  negative endocrine ROS  Renal/GU negative Renal ROS  negative genitourinary   Musculoskeletal negative musculoskeletal ROS (+)   Abdominal   Peds negative pediatric ROS (+)  Hematology  (+) Blood dyscrasia, anemia ,   Anesthesia Other Findings   Reproductive/Obstetrics negative OB ROS                             Anesthesia Physical Anesthesia Plan  ASA: III  Anesthesia Plan: General   Post-op Pain Management:    Induction: Intravenous  PONV Risk Score and Plan: 3 and Ondansetron, Dexamethasone and Scopolamine patch - Pre-op  Airway Management Planned: LMA  Additional Equipment: None  Intra-op Plan:   Post-operative Plan:   Informed Consent: I have reviewed the patients History and Physical, chart, labs and discussed the procedure including the risks, benefits and alternatives for the proposed anesthesia with the patient or authorized representative who has indicated his/her understanding and acceptance.       Plan Discussed with: CRNA, Anesthesiologist and Surgeon  Anesthesia Plan Comments:         Anesthesia Quick Evaluation

## 2020-07-14 ENCOUNTER — Encounter: Payer: Self-pay | Admitting: General Surgery

## 2020-07-16 NOTE — Progress Notes (Signed)
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  INTERVAL HISTORY: Patient returns to clinic today for further evaluation and discussion of her final pathology results after her simple mastectomy on July 13, 2020.  Her peripheral neuropathy has improved, but not completely resolved.  She has no other neurologic complaints.  She continues to have chronic weakness and fatigue.  Her appetite has improved as well.  She denies any recent fevers or illnesses. She has no chest pain, shortness of breath, cough, or hemoptysis.  She has no urinary complaints.  Patient offers no further specific complaints today.  REVIEW OF SYSTEMS:   Review of Systems  Constitutional: Positive for malaise/fatigue. Negative for fever and weight loss.  HENT: Negative for sore throat.   Respiratory: Negative.  Negative for cough, hemoptysis and shortness of breath.   Cardiovascular: Negative.  Negative for chest pain, palpitations and leg swelling.  Gastrointestinal: Negative for abdominal pain, diarrhea, nausea and vomiting.  Genitourinary: Negative.  Negative for dysuria.  Musculoskeletal: Negative.  Negative for back pain and myalgias.  Skin: Negative.  Negative for rash.  Neurological: Positive for speech change and weakness. Negative for dizziness, focal weakness and headaches.  Psychiatric/Behavioral: Negative.  Negative for depression. The patient  is not nervous/anxious and does not have insomnia.     As per HPI. Otherwise, a complete review of systems is negative.  PAST MEDICAL HISTORY: Past Medical History:  Diagnosis Date  . Anemia    during chemotherapy  . Brain tumor (benign) ([place])   . Breast cancer ([place]) 1999   Status post right breast lumpectomy and radiation  . COPD (chronic obstructive pulmonary disease) ([place])   . Depression   . History of chemotherapy 1999  . History of seizure disorder   . Hypertension   . Personal history of radiation therapy 1999  . Pneumonia   . PONV (postoperative nausea and vomiting)    vomiting    PAST SURGICAL HISTORY: Past Surgical History:  Procedure Laterality Date  . BRAIN SURGERY    . Brain tumor resection  1991  . BREAST BIOPSY Right 1999   Positive  . BREAST BIOPSY Right 11/10/2019   [place] bx 10:00 heart marker, path pending  . BREAST BIOPSY Right 11/10/2019   [place] bx axilla, hydromark 4, path pending  . BREAST BIOPSY Left 11/10/2019   [place] bx 2:00, venus marker, path pending  . PORTACATH PLACEMENT Left 11/30/2019   Procedure: INSERTION PORT-A-CATH;  Surgeon: Robert Bellow, MD;  Location: [facility] ORS;  Service: General;  Laterality: Left;  . Right breast lumpectomy    . SIMPLE MASTECTOMY WITH AXILLARY SENTINEL NODE BIOPSY Right 07/13/2020   Procedure: SIMPLE MASTECTOMY WITH AXILLARY SENTINEL NODE BIOPSY;  Surgeon: Robert Bellow, MD;  Location: [facility] ORS;  Service: General;  Laterality: Right;  . TONSILLECTOMY    . TUBAL LIGATION      FAMILY HISTORY: Family History  Problem Relation Age of Onset  . Depression Mother   . CAD Father   .  Hypertension Father   . Lung cancer Father   . Coronary artery disease Father 60  . Breast cancer Maternal Grandmother     ADVANCED DIRECTIVES (Y/N):  N  HEALTH MAINTENANCE: Social History   Tobacco Use  . Smoking status: Current Every Day Smoker    Packs/day: 0.25    Years: 40.00    Pack years: 10.00    Types: Cigarettes   . Smokeless tobacco: Never Used  Vaping Use  . Vaping Use: Never used  Substance Use Topics  . Alcohol use: No    Alcohol/week: 0.0 standard drinks  . Drug use: Yes    Frequency: 3.0 times per week    Types: Marijuana     Colonoscopy:  PAP:  Bone density:  Lipid panel:  Allergies  Allergen Reactions  . Imitrex [Sumatriptan] Anaphylaxis  . Penicillins Rash    Did it involve swelling of the face/tongue/throat, SOB, or low BP? Unknown Did it involve sudden or severe rash/hives, skin peeling, or any reaction on the inside of your mouth or nose? Unknown Did you need to seek medical attention at a hospital or doctor's office? Unknown When did it last happen?? If all above answers are "NO", may proceed with cephalosporin use.     Current Outpatient Medications  Medication Sig Dispense Refill  . acetaminophen (TYLENOL) 500 MG tablet Take 500-1,000 mg by mouth every 6 (six) hours as needed for moderate pain.     Marland Kitchen albuterol (VENTOLIN HFA) 108 (90 Base) MCG/ACT inhaler Inhale 1-2 puffs into the lungs every 6 (six) hours as needed for wheezing or shortness of breath.    . clonazePAM (KLONOPIN) 1 MG tablet Take 1 mg by mouth 3 (three) times daily as needed for anxiety.    Marland Kitchen HYDROcodone-acetaminophen (NORCO/VICODIN) 5-325 MG tablet Take 1 tablet by mouth every 4 (four) hours as needed for moderate pain. 20 tablet 0  . losartan (COZAAR) 100 MG tablet Take 100 mg by mouth daily.    Marland Kitchen tetrahydrozoline 0.05 % ophthalmic solution Place 1-2 drops into both eyes 3 (three) times daily as needed (dry/irritated eyes).    . traMADol (ULTRAM) 50 MG tablet Take 1 tablet (50 mg total) by mouth 2 (two) times daily. 60 tablet 0  . traZODone (DESYREL) 100 MG tablet Take 100 mg by mouth at bedtime.    . ALPRAZolam (XANAX) 0.25 MG tablet Take 1 tablet (0.25 mg total) by mouth at bedtime as needed for anxiety. (Patient not taking: Reported on 07/13/2020) 30 tablet 0  . amLODipine (NORVASC) 5 MG tablet Take  1 tablet (5 mg total) by mouth daily. (Patient not taking: Reported on 05/27/2020) 30 tablet 6  . diphenoxylate-atropine (LOMOTIL) 2.5-0.025 MG tablet TAKE 1 TABLET BY MOUTH 4 TIMES DAILY AS NEEDED FOR DIARRHEA OR LOOSE STOOLS (Patient not taking: Reported on 06/29/2020) 30 tablet 1  . nicotine (NICODERM CQ - DOSED IN MG/24 HOURS) 14 mg/24hr patch Place 14 mg onto the skin daily. (Patient not taking: Reported on 07/19/2020)    . ondansetron (ZOFRAN) 4 MG tablet Take 1 tablet (4 mg total) by mouth every 8 (eight) hours as needed for nausea or vomiting. (Patient not taking: Reported on 07/13/2020) 20 tablet 0  . ondansetron (ZOFRAN) 8 MG tablet TAKE 1 TABLET BY MOUTH 2 TIMES DAILY AS NEEDED (Patient not taking: Reported on 06/29/2020) 60 tablet 2  . potassium chloride SA (KLOR-CON) 20 MEQ tablet Take 1 tablet (20 mEq total) by mouth 2 (two) times daily. (Patient not taking: Reported  on 06/29/2020) 30 tablet 1  . prochlorperazine (COMPAZINE) 10 MG tablet TAKE 1 TABLET BY MOUTH EVERY 6 HOURS AS NEEDED (NAUSEA OR VOMITING) (Patient not taking: Reported on 06/29/2020) 60 tablet 2  . Pseudoeph-Doxylamine-DM-APAP (NYQUIL PO) Take 1-2 capsules by mouth at bedtime as needed (sleep.).  (Patient not taking: Reported on 05/27/2020)     No current facility-administered medications for this visit.   Facility-Administered Medications Ordered in Other Visits  Medication Dose Route Frequency Provider Last Rate Last Admin  . 0.9 %  sodium chloride infusion   Intravenous Once Jeralyn Ruths, MD 500 mL/hr at 04/06/20 1025 Rate Verify at 04/06/20 1025  . sodium chloride flush (NS) 0.9 % injection 10 mL  10 mL Intravenous PRN Jeralyn Ruths, MD      . sodium chloride flush (NS) 0.9 % injection 10 mL  10 mL Intravenous PRN Jeralyn Ruths, MD   10 mL at 01/13/20 0851    OBJECTIVE: Vitals:   07/20/20 1119  BP: (!) 135/94  Pulse: 90  Resp: 18  Temp: 99.1 F (37.3 C)  SpO2: 99%     Body mass index is  23.07 kg/m.    ECOG FS:1 - Symptomatic but completely ambulatory  General: Well-developed, well-nourished, no acute distress. Eyes: Pink conjunctiva, anicteric sclera. HEENT: Normocephalic, moist mucous membranes. Lungs: No audible wheezing or coughing. Heart: Regular rate and rhythm. Abdomen: Soft, nontender, no obvious distention. Musculoskeletal: No edema, cyanosis, or clubbing. Neuro: Alert, answering all questions appropriately. Cranial nerves grossly intact. Skin: No rashes or petechiae noted. Psych: Normal affect.   LAB RESULTS:  Lab Results  Component Value Date   NA 136 07/20/2020   K 3.4 (L) 07/20/2020   CL 103 07/20/2020   CO2 24 07/20/2020   GLUCOSE 97 07/20/2020   BUN 9 07/20/2020   CREATININE 0.67 07/20/2020   CALCIUM 9.0 07/20/2020   PROT 6.8 07/20/2020   ALBUMIN 3.7 07/20/2020   AST 17 07/20/2020   ALT 9 07/20/2020   ALKPHOS 81 07/20/2020   BILITOT 0.5 07/20/2020   GFRNONAA >60 07/20/2020   GFRAA >60 04/20/2020    Lab Results  Component Value Date   WBC 7.8 07/20/2020   NEUTROABS 5.8 07/20/2020   HGB 12.6 07/20/2020   HCT 36.6 07/20/2020   MCV 91.0 07/20/2020   PLT 226 07/20/2020     STUDIES: NM Sentinel Node Inj-No Rpt (Breast)  Result Date: 07/13/2020 Sulfur colloid was injected by the nuclear medicine technologist for melanoma sentinel node.    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   PLAN:    1. 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 Patient completed 4 cycles of Adriamycin and Cytoxan.  She only received 10 infusions of weekly Taxol her last one occurring on April 06, 2020.  Given her persistently decreased performance status and worsening neuropathy, treatment was discontinued. CT scan of the chest, abdomen, and  pelvis on December 01, 2019 reviewed independently with no obvious evidence of metastatic disease.  She underwent simple mastectomy on July 13, 2020 with significant improvement of her disease burden.  She does not require XRT given her simple mastectomy.  Return to clinic in 3 weeks for further evaluation and initiation of letrozole.  2.  History of DCIS: Status post lumpectomy and XRT.  Patient reports she did not  take any adjuvant hormonal therapy.  Given this history, have referred patient to genetic counseling for evaluation and testing. 3.  Insomnia: Continue Xanax as needed. 4.  Neutropenia: Resolved. 5.  Hypokalemia: Mild.  Patient admits to being noncompliant with oral potassium supplementation. 6.  Tooth abscess: Resolved.  Patient will require an appointment with dentistry or oral surgery at the conclusion of her treatments. 7.  Back pain: Patient was instructed to use ibuprofen sparingly.  Continue tramadol as needed.   8.  Myalgias/restless leg: Patient does not complain of this today. 9.  Palpitations/syncope: Appreciate cardiology input.  Continue follow-up with cardiology as scheduled.   10.  Depression: Chronic and unchanged.  Currently being managed by primary care. 11.  Diarrhea: Patient does not complain of this today.  Continue Imodium as needed.  Patient expressed understanding and was in agreement with this plan. She also understands that She can call clinic at any time with any questions, concerns, or complaints.   Cancer Staging Primary cancer of upper outer quadrant of right female breast [facility]) Staging form: Breast, AJCC 8th Edition - Clinical stage from 11/26/2019: Stage IIIA (cT2, cN2a, cM0, G3, ER+, PR+, HER2-) - Signed by Lloyd Huger, MD on 11/26/2019   Lloyd Huger, MD   07/23/2020 7:45 AM

## 2020-07-19 ENCOUNTER — Other Ambulatory Visit: Payer: Self-pay

## 2020-07-19 ENCOUNTER — Encounter: Payer: Self-pay | Admitting: Oncology

## 2020-07-19 DIAGNOSIS — C50411 Malignant neoplasm of upper-outer quadrant of right female breast: Secondary | ICD-10-CM

## 2020-07-20 ENCOUNTER — Other Ambulatory Visit: Payer: Self-pay | Admitting: Oncology

## 2020-07-20 ENCOUNTER — Ambulatory Visit: Payer: Medicaid Other | Admitting: Internal Medicine

## 2020-07-20 ENCOUNTER — Inpatient Hospital Stay: Payer: Medicaid Other

## 2020-07-20 ENCOUNTER — Inpatient Hospital Stay: Payer: Medicaid Other | Attending: Oncology | Admitting: Oncology

## 2020-07-20 VITALS — BP 135/94 | HR 90 | Temp 99.1°F | Resp 18 | Wt 147.3 lb

## 2020-07-20 DIAGNOSIS — Z86000 Personal history of in-situ neoplasm of breast: Secondary | ICD-10-CM | POA: Insufficient documentation

## 2020-07-20 DIAGNOSIS — C50411 Malignant neoplasm of upper-outer quadrant of right female breast: Secondary | ICD-10-CM | POA: Diagnosis not present

## 2020-07-20 DIAGNOSIS — M549 Dorsalgia, unspecified: Secondary | ICD-10-CM | POA: Diagnosis not present

## 2020-07-20 DIAGNOSIS — R002 Palpitations: Secondary | ICD-10-CM | POA: Insufficient documentation

## 2020-07-20 DIAGNOSIS — Z9011 Acquired absence of right breast and nipple: Secondary | ICD-10-CM | POA: Diagnosis not present

## 2020-07-20 DIAGNOSIS — Z170 Estrogen receptor positive status [ER+]: Secondary | ICD-10-CM | POA: Insufficient documentation

## 2020-07-20 DIAGNOSIS — R55 Syncope and collapse: Secondary | ICD-10-CM | POA: Insufficient documentation

## 2020-07-20 DIAGNOSIS — Z9221 Personal history of antineoplastic chemotherapy: Secondary | ICD-10-CM | POA: Insufficient documentation

## 2020-07-20 DIAGNOSIS — F32A Depression, unspecified: Secondary | ICD-10-CM | POA: Diagnosis not present

## 2020-07-20 DIAGNOSIS — Z923 Personal history of irradiation: Secondary | ICD-10-CM | POA: Insufficient documentation

## 2020-07-20 DIAGNOSIS — Z79899 Other long term (current) drug therapy: Secondary | ICD-10-CM | POA: Insufficient documentation

## 2020-07-20 DIAGNOSIS — Z006 Encounter for examination for normal comparison and control in clinical research program: Secondary | ICD-10-CM | POA: Insufficient documentation

## 2020-07-20 DIAGNOSIS — G620 Drug-induced polyneuropathy: Secondary | ICD-10-CM | POA: Diagnosis not present

## 2020-07-20 DIAGNOSIS — T451X5A Adverse effect of antineoplastic and immunosuppressive drugs, initial encounter: Secondary | ICD-10-CM | POA: Diagnosis not present

## 2020-07-20 DIAGNOSIS — E876 Hypokalemia: Secondary | ICD-10-CM | POA: Diagnosis not present

## 2020-07-20 DIAGNOSIS — G4700 Insomnia, unspecified: Secondary | ICD-10-CM | POA: Diagnosis not present

## 2020-07-20 LAB — CBC WITH DIFFERENTIAL/PLATELET
Abs Immature Granulocytes: 0.03 10*3/uL (ref 0.00–0.07)
Basophils Absolute: 0 10*3/uL (ref 0.0–0.1)
Basophils Relative: 0 %
Eosinophils Absolute: 0.2 10*3/uL (ref 0.0–0.5)
Eosinophils Relative: 3 %
HCT: 36.6 % (ref 36.0–46.0)
Hemoglobin: 12.6 g/dL (ref 12.0–15.0)
Immature Granulocytes: 0 %
Lymphocytes Relative: 16 %
Lymphs Abs: 1.2 10*3/uL (ref 0.7–4.0)
MCH: 31.3 pg (ref 26.0–34.0)
MCHC: 34.4 g/dL (ref 30.0–36.0)
MCV: 91 fL (ref 80.0–100.0)
Monocytes Absolute: 0.5 10*3/uL (ref 0.1–1.0)
Monocytes Relative: 7 %
Neutro Abs: 5.8 10*3/uL (ref 1.7–7.7)
Neutrophils Relative %: 74 %
Platelets: 226 10*3/uL (ref 150–400)
RBC: 4.02 MIL/uL (ref 3.87–5.11)
RDW: 13.2 % (ref 11.5–15.5)
WBC: 7.8 10*3/uL (ref 4.0–10.5)
nRBC: 0 % (ref 0.0–0.2)

## 2020-07-20 LAB — COMPREHENSIVE METABOLIC PANEL
ALT: 9 U/L (ref 0–44)
AST: 17 U/L (ref 15–41)
Albumin: 3.7 g/dL (ref 3.5–5.0)
Alkaline Phosphatase: 81 U/L (ref 38–126)
Anion gap: 9 (ref 5–15)
BUN: 9 mg/dL (ref 6–20)
CO2: 24 mmol/L (ref 22–32)
Calcium: 9 mg/dL (ref 8.9–10.3)
Chloride: 103 mmol/L (ref 98–111)
Creatinine, Ser: 0.67 mg/dL (ref 0.44–1.00)
GFR, Estimated: >60 mL/min (ref >60)
Glucose, Bld: 97 mg/dL (ref 70–99)
Potassium: 3.4 mmol/L — ABNORMAL LOW (ref 3.5–5.1)
Sodium: 136 mmol/L (ref 135–145)
Total Bilirubin: 0.5 mg/dL (ref 0.3–1.2)
Total Protein: 6.8 g/dL (ref 6.5–8.1)

## 2020-07-20 LAB — SURGICAL PATHOLOGY

## 2020-07-20 NOTE — Research (Signed)
SWOG H4174 Week 24 Assessment:  Patient in to clinic for her scheduled week 24 assessment unaccompanied this am. Labs were drawn at approximately 1055 am per protocol requirements for optional study labs by peripheral stick to left antecubital region without difficulty, 2 red top and 2 green top tubes. Lab specimens were given to Kim in the lab to process with instructions from [facility] and the appropriate cryovials. The red tubes were put on timer for 30 minutes and the green tubes were in an ice bath. The patient was assisted to the waiting area for her appointment with Dr. Grayland Ormond today. She was given her 24 week self administered questionnaires to complete prior to her visit with him.  Dr. Grayland Ormond in to see patient and assess solicited AE's for which the patient has no complaints today. The only complaint the patient has is pain related to her breast surgery for which she is taking hydrocodone until Thursday. She voiced inability currently to have her tramadol refilled by her primary care physician, but she will keep trying. Dr. Grayland Ormond reviewed the plan for her to complete radiation therapy with Dr. Baruch Gouty in about 4-6 weeks after her surgery. Her radiation appointment for consultation will be made today prior to her leaving. The patient asked what pill she would need to take for her breast cancer. Dr. Grayland Ormond explained that she would not have to take anything until after her radiation was completed. Dr. Grayland Ormond completed the neuropathy events form and the follow up physician assessment with a toxicity burden of 0. The neuropathy assessment was completed using her right arm and right leg ( dominant side ). The patient was able to detect sensation and sharpness with the neuropen monofilament and neurotip to all sites of her right foot, the heel was slightly less sensation. The tuning fork was completed to her right arm and leg without any difficulty. The patient denies any falls in the past 6 months, and  her Timed Get Up and Go test was 17.6 seconds. This research nurse reminded the patient she will see her again for her next assessment at 52 weeks. The patient was encouraged to call if she has any questions or concerns before her next visit. The patient voiced understanding of all procedures explained and is in agreement with them. Research RN spent approximately one hour with the patient to complete all of her protocol related activities.    AJAHNAE RATHGEBER [id number]  07/20/2020  Adverse Event Log  Study/Protocol: [id number] Cycle: Week 24  Event Grade Onset Date Resolved Date Drug Name Attribution Treatment Comments   Dysesthesia          Neuralgia          Paresthesia          Peripheral Motor Neuropathy          Peripheral Sensory Neuropathy  Grade 1  03/02/2020  07/20/2020   Probable  None    Fatigue Grade 1 03/02/2020   Possible None   Pain Grade 1 07/18/2020   Not related Hydrocodone Pain related to lumpectomy last week    Jeral Fruit, RN, BSN, OCN 07/20/20 1100 am

## 2020-07-22 ENCOUNTER — Encounter: Payer: Medicaid Other | Admitting: Plastic Surgery

## 2020-07-22 ENCOUNTER — Encounter: Payer: Self-pay | Admitting: Radiation Oncology

## 2020-07-25 ENCOUNTER — Telehealth: Payer: Self-pay | Admitting: *Deleted

## 2020-07-25 ENCOUNTER — Ambulatory Visit
Admission: RE | Admit: 2020-07-25 | Discharge: 2020-07-25 | Disposition: A | Discharge status: Home / Self Care | Payer: Medicaid Other | Source: Ambulatory Visit | Attending: Radiation Oncology | Admitting: Radiation Oncology

## 2020-07-25 NOTE — Telephone Encounter (Signed)
Left vm for patient to let her know appointment with Dr. Baruch Gouty has been cancelled and is not needed per Dr. Grayland Ormond.

## 2020-07-26 ENCOUNTER — Institutional Professional Consult (permissible substitution): Payer: Medicaid Other | Admitting: Radiation Oncology

## 2020-07-27 ENCOUNTER — Ambulatory Visit: Payer: Medicaid Other | Admitting: Internal Medicine

## 2020-08-02 ENCOUNTER — Encounter: Payer: Medicaid Other | Admitting: Surgical

## 2020-08-06 NOTE — Progress Notes (Signed)
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  INTERVAL HISTORY: Patient returns to clinic today for further evaluation and initiation of letrozole.  She currently feels well.  She has minimal residual peripheral neuropathy.  She has no other neurologic complaints.  She has difficulty with range of motion of her right arm since her mastectomy.  She does not complain of weakness or fatigue today.  She has a good appetite.  She denies any recent fevers or illnesses. She has no chest pain, shortness of breath, cough, or hemoptysis.  She denies any nausea, vomiting, constipation, or diarrhea.  She has no urinary complaints.  Patient offers no further specific complaints today.  REVIEW OF SYSTEMS:   Review of Systems  Constitutional: Negative.  Negative for fever, malaise/fatigue and weight loss.  HENT: Negative for sore throat.   Respiratory: Negative.  Negative for cough, hemoptysis and shortness of breath.   Cardiovascular: Negative.  Negative for chest pain, palpitations and leg swelling.  Gastrointestinal: Negative for abdominal pain, diarrhea, nausea and vomiting.  Genitourinary: Negative.  Negative for dysuria.  Musculoskeletal: Negative.  Negative for back pain and myalgias.  Skin: Negative.  Negative for rash.  Neurological: Positive for sensory change. Negative for dizziness, speech change, focal weakness, weakness and  headaches.  Psychiatric/Behavioral: Negative.  Negative for depression. The patient is not nervous/anxious and does not have insomnia.     As per HPI. Otherwise, a complete review of systems is negative.  PAST MEDICAL HISTORY: Past Medical History:  Diagnosis Date  . Anemia    during chemotherapy  . Brain tumor (benign) ([place])   . Breast cancer ([place]) 1999   Status post right breast lumpectomy and radiation  . COPD (chronic obstructive pulmonary disease) ([place])   . Depression   . History of chemotherapy 1999  . History of seizure disorder   . Hypertension   . Personal history of radiation therapy 1999  . Pneumonia   . PONV (postoperative nausea and vomiting)    vomiting    PAST SURGICAL HISTORY: Past Surgical History:  Procedure Laterality Date  . BRAIN SURGERY    . Brain tumor resection  1991  . BREAST BIOPSY Right 1999   Positive  . BREAST BIOPSY Right 11/10/2019   [place] bx 10:00 heart marker, path pending  . BREAST BIOPSY Right 11/10/2019   [place] bx axilla, hydromark 4, path pending  . BREAST BIOPSY Left 11/10/2019   [place] bx 2:00, venus marker, path pending  . PORTACATH PLACEMENT Left 11/30/2019   Procedure: INSERTION PORT-A-CATH;  Surgeon: Robert Bellow, MD;  Location: [facility] ORS;  Service: General;  Laterality: Left;  . Right breast lumpectomy    . SIMPLE MASTECTOMY WITH AXILLARY SENTINEL NODE BIOPSY Right 07/13/2020   Procedure: SIMPLE MASTECTOMY WITH AXILLARY SENTINEL NODE BIOPSY;  Surgeon: Robert Bellow, MD;  Location: [facility] ORS;  Service: General;  Laterality: Right;  . TONSILLECTOMY    . TUBAL LIGATION      FAMILY HISTORY: Family History  Problem  Relation Age of Onset  . Depression Mother   . CAD Father   . Hypertension Father   . Lung cancer Father   . Coronary artery disease Father 61  . Breast cancer Maternal Grandmother     ADVANCED DIRECTIVES (Y/N):  N  HEALTH MAINTENANCE: Social History   Tobacco Use  . Smoking status: Current Every Day  Smoker    Packs/day: 0.25    Years: 40.00    Pack years: 10.00    Types: Cigarettes  . Smokeless tobacco: Never Used  Vaping Use  . Vaping Use: Never used  Substance Use Topics  . Alcohol use: No    Alcohol/week: 0.0 standard drinks  . Drug use: Yes    Frequency: 3.0 times per week    Types: Marijuana     Colonoscopy:  PAP:  Bone density:  Lipid panel:  Allergies  Allergen Reactions  . Imitrex [Sumatriptan] Anaphylaxis  . Penicillins Rash    Did it involve swelling of the face/tongue/throat, SOB, or low BP? Unknown Did it involve sudden or severe rash/hives, skin peeling, or any reaction on the inside of your mouth or nose? Unknown Did you need to seek medical attention at a hospital or doctor's office? Unknown When did it last happen?? If all above answers are "NO", may proceed with cephalosporin use.     Current Outpatient Medications  Medication Sig Dispense Refill  . acetaminophen (TYLENOL) 500 MG tablet Take 500-1,000 mg by mouth every 6 (six) hours as needed for moderate pain.     Marland Kitchen albuterol (VENTOLIN HFA) 108 (90 Base) MCG/ACT inhaler Inhale 1-2 puffs into the lungs every 6 (six) hours as needed for wheezing or shortness of breath.    . clonazePAM (KLONOPIN) 1 MG tablet Take 1 mg by mouth 3 (three) times daily as needed for anxiety.    . gabapentin (NEURONTIN) 100 MG capsule Take 100 mg by mouth 2 (two) times daily.    Marland Kitchen losartan (COZAAR) 100 MG tablet Take 100 mg by mouth daily.    Marland Kitchen oxyCODONE (OXY IR/ROXICODONE) 5 MG immediate release tablet Take 5 mg by mouth every 6 (six) hours as needed for severe pain. PRN BID    . Pseudoeph-Doxylamine-DM-APAP (NYQUIL PO) Take 1-2 capsules by mouth at bedtime as needed (sleep.).     Marland Kitchen tetrahydrozoline 0.05 % ophthalmic solution Place 1-2 drops into both eyes 3 (three) times daily as needed (dry/irritated eyes).    . traZODone (DESYREL) 100 MG tablet Take 100 mg by mouth at bedtime.    . ALPRAZolam (XANAX) 0.25 MG tablet Take  1 tablet (0.25 mg total) by mouth at bedtime as needed for anxiety. (Patient not taking: Reported on 07/13/2020) 30 tablet 0  . amLODipine (NORVASC) 5 MG tablet Take 1 tablet (5 mg total) by mouth daily. (Patient not taking: Reported on 05/27/2020) 30 tablet 6  . diphenoxylate-atropine (LOMOTIL) 2.5-0.025 MG tablet TAKE 1 TABLET BY MOUTH 4 TIMES DAILY AS NEEDED FOR DIARRHEA OR LOOSE STOOLS (Patient not taking: Reported on 06/29/2020) 30 tablet 1  . HYDROcodone-acetaminophen (NORCO/VICODIN) 5-325 MG tablet Take 1 tablet by mouth every 4 (four) hours as needed for moderate pain. (Patient not taking: Reported on 08/10/2020) 20 tablet 0  . letrozole (FEMARA) 2.5 MG tablet Take 1 tablet (2.5 mg total) by mouth daily. 30 tablet 3  . nicotine (NICODERM CQ - DOSED IN MG/24 HOURS) 14 mg/24hr patch Place 14 mg onto the skin daily. (Patient not taking: Reported on 07/19/2020)    .  ondansetron (ZOFRAN) 4 MG tablet Take 1 tablet (4 mg total) by mouth every 8 (eight) hours as needed for nausea or vomiting. (Patient not taking: Reported on 07/13/2020) 20 tablet 0  . ondansetron (ZOFRAN) 8 MG tablet TAKE 1 TABLET BY MOUTH 2 TIMES DAILY AS NEEDED (Patient not taking: Reported on 06/29/2020) 60 tablet 2  . potassium chloride SA (KLOR-CON) 20 MEQ tablet Take 1 tablet (20 mEq total) by mouth 2 (two) times daily. (Patient not taking: Reported on 06/29/2020) 30 tablet 1  . prochlorperazine (COMPAZINE) 10 MG tablet TAKE 1 TABLET BY MOUTH EVERY 6 HOURS AS NEEDED (NAUSEA OR VOMITING) (Patient not taking: Reported on 06/29/2020) 60 tablet 2  . traMADol (ULTRAM) 50 MG tablet Take 1 tablet (50 mg total) by mouth 2 (two) times daily. (Patient not taking: Reported on 08/10/2020) 60 tablet 0   No current facility-administered medications for this visit.   Facility-Administered Medications Ordered in Other Visits  Medication Dose Route Frequency Provider Last Rate Last Admin  . 0.9 %  sodium chloride infusion   Intravenous Once  Jeralyn Ruths, MD 500 mL/hr at 04/06/20 1025 Rate Verify at 04/06/20 1025  . sodium chloride flush (NS) 0.9 % injection 10 mL  10 mL Intravenous PRN Jeralyn Ruths, MD      . sodium chloride flush (NS) 0.9 % injection 10 mL  10 mL Intravenous PRN Jeralyn Ruths, MD   10 mL at 01/13/20 0851    OBJECTIVE: Vitals:   08/10/20 1023  BP: (!) 138/99  Pulse: (!) 106  Resp: 18  Temp: 99.2 F (37.3 C)  SpO2: 98%     Body mass index is 22.41 kg/m.    ECOG FS:0 - Asymptomatic  General: Well-developed, well-nourished, no acute distress. Eyes: Pink conjunctiva, anicteric sclera. HEENT: Normocephalic, moist mucous membranes. Breast: Right mastectomy. Lungs: No audible wheezing or coughing. Heart: Regular rate and rhythm. Abdomen: Soft, nontender, no obvious distention. Musculoskeletal: No edema, cyanosis, or clubbing. Neuro: Alert, answering all questions appropriately. Cranial nerves grossly intact. Skin: No rashes or petechiae noted. Psych: Normal affect.   LAB RESULTS:  Lab Results  Component Value Date   NA 136 07/20/2020   K 3.4 (L) 07/20/2020   CL 103 07/20/2020   CO2 24 07/20/2020   GLUCOSE 97 07/20/2020   BUN 9 07/20/2020   CREATININE 0.67 07/20/2020   CALCIUM 9.0 07/20/2020   PROT 6.8 07/20/2020   ALBUMIN 3.7 07/20/2020   AST 17 07/20/2020   ALT 9 07/20/2020   ALKPHOS 81 07/20/2020   BILITOT 0.5 07/20/2020   GFRNONAA >60 07/20/2020   GFRAA >60 04/20/2020    Lab Results  Component Value Date   WBC 7.8 07/20/2020   NEUTROABS 5.8 07/20/2020   HGB 12.6 07/20/2020   HCT 36.6 07/20/2020   MCV 91.0 07/20/2020   PLT 226 07/20/2020     STUDIES: NM Sentinel Node Inj-No Rpt (Breast)  Result Date: 07/13/2020 Sulfur colloid was injected by the nuclear medicine technologist for melanoma sentinel node.    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   PLAN:    1. 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 Patient completed 4 cycles of Adriamycin and Cytoxan.  She only received 10 infusions of weekly Taxol her last one occurring on April 06, 2020.  Given her  persistently decreased performance status and worsening neuropathy, treatment was discontinued. CT scan of the chest, abdomen, and pelvis on December 01, 2019 reviewed independently with no obvious evidence of metastatic disease.  She underwent simple mastectomy on July 13, 2020 with significant improvement of her disease burden.  She does not require XRT given her simple mastectomy.  Proceed with letrozole today for minimum of 5 years completing treatment in December 2026.  Given her high risk disease, could possibly extend treatment for 7 to 10 years.  Patient will require a baseline bone mineral density in the next 1 to 2 weeks.  Return to clinic in 3 months for routine evaluation. 2.  History of DCIS: Status post lumpectomy and XRT.  Patient reports she did not take any adjuvant hormonal therapy.  Given this history, have referred patient to genetic counseling for evaluation and testing. 3.  Insomnia: Patient does not complain of this today.  Continue Xanax as needed. 4.  Neutropenia: Resolved. 5.  Hypokalemia: Mild.  Patient admits to being noncompliant with oral potassium supplementation. 6.  Tooth abscess: Resolved.  Patient has been instructed to schedule a dental appointment in the near future. 7.  Back pain: Chronic and unchanged.  Patient was instructed to use ibuprofen sparingly.  Continue tramadol as needed.   8.  Myalgias/restless leg: Patient does not complain of this today. 9.  Palpitations/syncope: Appreciate cardiology input.  Continue follow-up with cardiology as scheduled.   10.  Depression: Chronic and unchanged.  Currently being managed by primary care. 11.   Diarrhea: Resolved. 12.  Decreased range of motion of right arm: Referral has been given to Occupational Therapy.  Patient expressed understanding and was in agreement with this plan. She also understands that She can call clinic at any time with any questions, concerns, or complaints.   Cancer Staging Primary cancer of upper outer quadrant of right female breast [facility]) Staging form: Breast, AJCC 8th Edition - Clinical stage from 11/26/2019: Stage IIIA (cT2, cN2a, cM0, G3, ER+, PR+, HER2-) - Signed by Lloyd Huger, MD on 11/26/2019   Lloyd Huger, MD   08/11/2020 6:48 AM

## 2020-08-10 ENCOUNTER — Inpatient Hospital Stay: Payer: Medicaid Other | Attending: Oncology | Admitting: Oncology

## 2020-08-10 ENCOUNTER — Encounter: Payer: Self-pay | Admitting: Oncology

## 2020-08-10 VITALS — BP 138/99 | HR 106 | Temp 99.2°F | Resp 18 | Wt 143.1 lb

## 2020-08-10 DIAGNOSIS — Z452 Encounter for adjustment and management of vascular access device: Secondary | ICD-10-CM | POA: Diagnosis present

## 2020-08-10 DIAGNOSIS — F329 Major depressive disorder, single episode, unspecified: Secondary | ICD-10-CM | POA: Insufficient documentation

## 2020-08-10 DIAGNOSIS — Z801 Family history of malignant neoplasm of trachea, bronchus and lung: Secondary | ICD-10-CM | POA: Diagnosis not present

## 2020-08-10 DIAGNOSIS — Z923 Personal history of irradiation: Secondary | ICD-10-CM | POA: Diagnosis not present

## 2020-08-10 DIAGNOSIS — R55 Syncope and collapse: Secondary | ICD-10-CM | POA: Insufficient documentation

## 2020-08-10 DIAGNOSIS — F17210 Nicotine dependence, cigarettes, uncomplicated: Secondary | ICD-10-CM | POA: Diagnosis not present

## 2020-08-10 DIAGNOSIS — Z170 Estrogen receptor positive status [ER+]: Secondary | ICD-10-CM | POA: Diagnosis not present

## 2020-08-10 DIAGNOSIS — G8929 Other chronic pain: Secondary | ICD-10-CM | POA: Insufficient documentation

## 2020-08-10 DIAGNOSIS — T451X5A Adverse effect of antineoplastic and immunosuppressive drugs, initial encounter: Secondary | ICD-10-CM | POA: Insufficient documentation

## 2020-08-10 DIAGNOSIS — G620 Drug-induced polyneuropathy: Secondary | ICD-10-CM | POA: Diagnosis not present

## 2020-08-10 DIAGNOSIS — Z9221 Personal history of antineoplastic chemotherapy: Secondary | ICD-10-CM | POA: Diagnosis not present

## 2020-08-10 DIAGNOSIS — E876 Hypokalemia: Secondary | ICD-10-CM | POA: Insufficient documentation

## 2020-08-10 DIAGNOSIS — M549 Dorsalgia, unspecified: Secondary | ICD-10-CM | POA: Diagnosis not present

## 2020-08-10 DIAGNOSIS — R002 Palpitations: Secondary | ICD-10-CM | POA: Insufficient documentation

## 2020-08-10 DIAGNOSIS — C50411 Malignant neoplasm of upper-outer quadrant of right female breast: Secondary | ICD-10-CM | POA: Diagnosis not present

## 2020-08-10 DIAGNOSIS — Z803 Family history of malignant neoplasm of breast: Secondary | ICD-10-CM | POA: Insufficient documentation

## 2020-08-10 MED ORDER — LETROZOLE 2.5 MG PO TABS: 2.5000 mg | ORAL_TABLET | Freq: Every day | ORAL | 3 refills | Status: DC

## 2020-08-11 ENCOUNTER — Encounter: Payer: Self-pay | Admitting: Oncology

## 2020-08-12 ENCOUNTER — Other Ambulatory Visit: Payer: Self-pay | Admitting: Surgical

## 2020-08-12 ENCOUNTER — Other Ambulatory Visit: Payer: Self-pay | Admitting: Oncology

## 2020-08-12 DIAGNOSIS — C50411 Malignant neoplasm of upper-outer quadrant of right female breast: Secondary | ICD-10-CM

## 2020-08-17 ENCOUNTER — Inpatient Hospital Stay: Payer: Medicaid Other | Admitting: Occupational Therapy

## 2020-08-17 ENCOUNTER — Inpatient Hospital Stay: Payer: Medicaid Other

## 2020-08-17 DIAGNOSIS — Z95828 Presence of other vascular implants and grafts: Secondary | ICD-10-CM

## 2020-08-17 DIAGNOSIS — C50411 Malignant neoplasm of upper-outer quadrant of right female breast: Secondary | ICD-10-CM | POA: Diagnosis not present

## 2020-08-17 DIAGNOSIS — M25611 Stiffness of right shoulder, not elsewhere classified: Secondary | ICD-10-CM

## 2020-08-17 DIAGNOSIS — L905 Scar conditions and fibrosis of skin: Secondary | ICD-10-CM

## 2020-08-17 MED ORDER — SODIUM CHLORIDE 0.9% FLUSH
10.0000 mL | Freq: Once | INTRAVENOUS | Status: AC
  Administered 2020-08-17: 10 mL via INTRAVENOUS
  Filled 2020-08-17: qty 10

## 2020-08-17 MED ORDER — HEPARIN SOD (PORK) LOCK FLUSH 100 UNIT/ML IV SOLN
INTRAVENOUS | Status: AC
  Filled 2020-08-17: qty 5

## 2020-08-17 MED ORDER — HEPARIN SOD (PORK) LOCK FLUSH 100 UNIT/ML IV SOLN
500.0000 [IU] | Freq: Once | INTRAVENOUS | Status: AC
  Administered 2020-08-17: 500 [IU] via INTRAVENOUS
  Filled 2020-08-17: qty 5

## 2020-08-17 NOTE — Therapy (Signed)
[facility] [address] [place], [place], [place], [address] Phone: [phone]   Fax:  [phone]  Occupational Therapy Screen  Patient Details  Name: Kelsey Russo MRN: [id number] Date of Birth: 1966/07/05 No data recorded  Encounter Date: 08/17/2020   OT End of Session - 08/17/20 1203    Visit Number 0           Past Medical History:  Diagnosis Date  . Anemia    during chemotherapy  . Brain tumor (benign) ([place])   . Breast cancer ([place]) 1999   Status post right breast lumpectomy and radiation  . COPD (chronic obstructive pulmonary disease) ([place])   . Depression   . History of chemotherapy 1999  . History of seizure disorder   . Hypertension   . Personal history of radiation therapy 1999  . Pneumonia   . PONV (postoperative nausea and vomiting)    vomiting    Past Surgical History:  Procedure Laterality Date  . BRAIN SURGERY    . Brain tumor resection  1991  . BREAST BIOPSY Right 1999   Positive  . BREAST BIOPSY Right 11/10/2019   [place] bx 10:00 heart marker, path pending  . BREAST BIOPSY Right 11/10/2019   [place] bx axilla, hydromark 4, path pending  . BREAST BIOPSY Left 11/10/2019   [place] bx 2:00, venus marker, path pending  . PORTACATH PLACEMENT Left 11/30/2019   Procedure: INSERTION PORT-A-CATH;  Surgeon: Robert Bellow, MD;  Location: [facility] ORS;  Service: General;  Laterality: Left;  . Right breast lumpectomy    . SIMPLE MASTECTOMY WITH AXILLARY SENTINEL NODE BIOPSY Right 07/13/2020   Procedure: SIMPLE MASTECTOMY WITH AXILLARY SENTINEL NODE BIOPSY;  Surgeon: Robert Bellow, MD;  Location: [facility] ORS;  Service: General;  Laterality: Right;  . TONSILLECTOMY    . TUBAL LIGATION      There were no vitals filed for this visit.   Subjective Assessment - 08/17/20 1200    Subjective  I had about 4-5 wks ago mastectomy to R breast - had lumpectomy and radiation on same breast in 1999 about - this time do  not need radiation - my shoulder just stiff and tight under m7y arm and chest    Currently in Pain? Yes    Pain Score 3     Pain Location --   R axilla and chest   Pain Orientation Right    Pain Descriptors / Indicators Aching;Tightness;Sore    Pain Type Surgical pain    Pain Onset More than a month ago    Pain Frequency Intermittent           08/10/20 NOTE FROM DR Grayland Ormond:  PLAN:    1. 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 Patient completed 4 cycles of Adriamycin and Cytoxan.  She only received 10 infusions of weekly Taxol her last one occurring on April 06, 2020.  Given her persistently decreased performance status and worsening neuropathy, treatment was discontinued. CT scan of the chest, abdomen, and pelvis on December 01, 2019 reviewed independently with no obvious evidence of metastatic disease.  She underwent simple mastectomy on July 13, 2020 with significant improvement of her disease burden.  She does not require XRT given her simple mastectomy.  Proceed with letrozole today for minimum of 5 years completing treatment in December 2026.  Given her high risk  disease, could possibly extend treatment for 7 to 10 years.  Patient will require a baseline bone mineral density in the next 1 to 2 weeks.  Return to clinic in 3 months for routine evaluation. 2.  History of DCIS: Status post lumpectomy and XRT.  Patient reports she did not take any adjuvant hormonal therapy.  Given this history, have referred patient to genetic counseling for evaluation and testing. 3.  Insomnia: Patient does not complain of this today.  Continue Xanax as needed. 4.  Neutropenia: Resolved. 5.  Hypokalemia: Mild.  Patient admits to being noncompliant with oral potassium supplementation. 6.  Tooth abscess: Resolved.  Patient has been instructed to schedule a dental  appointment in the near future. 7.  Back pain: Chronic and unchanged.  Patient was instructed to use ibuprofen sparingly.  Continue tramadol as needed.   8.  Myalgias/restless leg: Patient does not complain of this today. 9.  Palpitations/syncope: Appreciate cardiology input.  Continue follow-up with cardiology as scheduled.   10.  Depression: Chronic and unchanged.  Currently being managed by primary care. 11.  Diarrhea: Resolved. 12.  Decreased range of motion of right arm: Referral has been given to Occupational Therapy  OT SCREEN 08/17/2020: Pt arrive with having R mastectomy done 5 wks today - had some fluid drained off yesterday by Dr Bary Castilla Pt show decrease AROM for R shoulder flexion 120 , ABD 105 and external rotation over head - tight pect  Scar tissue adhere -still some scabs  Tightness under arm - circumference about same -pt is R hand dominant - will monitor Pt mostly doing house work and watch tv - not working - applied for disabiity  Pt was ed on scar massage  And HEP  pect stretch on wall - 10 reps hold 5 sec  And AAROM on wall for shoulder flexion - 10 reps ABD of shoulder on wall AAROM - 2 x 5 reps   Make sure stretch is under arm/axilla  Can also do pect stretch in supine and AROM for ext rotation in prone  Slight pull  10 reps  will re-assess in 2 wks - if not progress - will recommend OT evaluation and tx in outpt      LYMPHEDEMA/ONCOLOGY QUESTIONNAIRE - 08/17/20 0001      Right Upper Extremity Lymphedema   15 cm Proximal to Olecranon Process 29 cm    10 cm Proximal to Olecranon Process 29 cm    Olecranon Process 24.5 cm      Left Upper Extremity Lymphedema   15 cm Proximal to Olecranon Process 29 cm    10 cm Proximal to Olecranon Process 28.7 cm    Olecranon Process 24.6 cm                                      Patient will benefit from skilled therapeutic intervention in order to improve the following deficits and  impairments:           Visit Diagnosis: Stiffness of right shoulder, not elsewhere classified  Scar condition and fibrosis of skin    Problem List Patient Active Problem List   Diagnosis Date Noted  . Syncope and collapse 02/23/2020  . Palpitations 02/23/2020  . Essential hypertension 02/23/2020  . Goals of care, counseling/discussion 11/26/2019  . Primary cancer of upper outer quadrant of right female breast ([place]) 11/19/2019    Rosalyn Gess OTR/l,CLT 08/17/2020, 12:05  PM  [facility] [address] [place], [place], [place], [address] Phone: [phone]   Fax:  [phone]  Name: Kelsey Russo MRN: [id number] Date of Birth: 01/22/1966

## 2020-08-24 ENCOUNTER — Other Ambulatory Visit: Payer: Self-pay

## 2020-08-24 ENCOUNTER — Ambulatory Visit
Admission: RE | Admit: 2020-08-24 | Discharge: 2020-08-24 | Disposition: A | Discharge status: Home / Self Care | Payer: Medicaid Other | Source: Ambulatory Visit | Attending: Oncology | Admitting: Oncology

## 2020-08-24 DIAGNOSIS — C50411 Malignant neoplasm of upper-outer quadrant of right female breast: Secondary | ICD-10-CM | POA: Insufficient documentation

## 2020-08-25 ENCOUNTER — Telehealth: Payer: Self-pay | Admitting: *Deleted

## 2020-08-25 ENCOUNTER — Other Ambulatory Visit: Payer: Self-pay | Admitting: *Deleted

## 2020-08-25 MED ORDER — TAMOXIFEN CITRATE 20 MG PO TABS: 20.0000 mg | ORAL_TABLET | Freq: Every day | ORAL | 3 refills | Status: DC

## 2020-08-25 MED ORDER — ALENDRONATE SODIUM 70 MG PO TABS: 70.0000 mg | ORAL_TABLET | ORAL | 3 refills | Status: DC

## 2020-08-25 NOTE — Telephone Encounter (Signed)
Call placed to patient to follow up on results of bone density scan. Based on results Dr. Grayland Ormond recommends patient stop letrozole and start on new medications-tamoxifen and fosamax. Patient agreeable and verbalized understanding of plan. New prescriptions have been sent.

## 2020-08-25 NOTE — Progress Notes (Signed)
t

## 2020-08-31 ENCOUNTER — Inpatient Hospital Stay: Payer: Medicaid Other | Admitting: Occupational Therapy

## 2020-09-07 ENCOUNTER — Inpatient Hospital Stay: Payer: Medicaid Other | Attending: Oncology | Admitting: Occupational Therapy

## 2020-09-07 ENCOUNTER — Other Ambulatory Visit: Payer: Self-pay | Admitting: *Deleted

## 2020-09-07 ENCOUNTER — Other Ambulatory Visit: Payer: Self-pay

## 2020-09-07 DIAGNOSIS — C50411 Malignant neoplasm of upper-outer quadrant of right female breast: Secondary | ICD-10-CM | POA: Insufficient documentation

## 2020-09-07 DIAGNOSIS — Z452 Encounter for adjustment and management of vascular access device: Secondary | ICD-10-CM | POA: Insufficient documentation

## 2020-09-07 DIAGNOSIS — M25611 Stiffness of right shoulder, not elsewhere classified: Secondary | ICD-10-CM

## 2020-09-07 DIAGNOSIS — L905 Scar conditions and fibrosis of skin: Secondary | ICD-10-CM

## 2020-09-07 NOTE — Progress Notes (Signed)
ar

## 2020-09-07 NOTE — Therapy (Signed)
[facility] [address] [place], [place], [place], [address] Phone: [phone]   Fax:  [phone]  Occupational Therapy Screen  Patient Details  Name: Kelsey Russo MRN: [id number] Date of Birth: 1965/10/07 No data recorded  Encounter Date: 09/07/2020   OT End of Session - 09/07/20 1117    Visit Number 0           Past Medical History:  Diagnosis Date  . Anemia    during chemotherapy  . Brain tumor (benign) ([place])   . Breast cancer ([place]) 1999   Status post right breast lumpectomy and radiation  . COPD (chronic obstructive pulmonary disease) ([place])   . Depression   . History of chemotherapy 1999  . History of seizure disorder   . Hypertension   . Personal history of radiation therapy 1999  . Pneumonia   . PONV (postoperative nausea and vomiting)    vomiting    Past Surgical History:  Procedure Laterality Date  . BRAIN SURGERY    . Brain tumor resection  1991  . BREAST BIOPSY Right 1999   Positive  . BREAST BIOPSY Right 11/10/2019   [place] bx 10:00 heart marker, path pending  . BREAST BIOPSY Right 11/10/2019   [place] bx axilla, hydromark 4, path pending  . BREAST BIOPSY Left 11/10/2019   [place] bx 2:00, venus marker, path pending  . PORTACATH PLACEMENT Left 11/30/2019   Procedure: INSERTION PORT-A-CATH;  Surgeon: Robert Bellow, MD;  Location: [facility] ORS;  Service: General;  Laterality: Left;  . Right breast lumpectomy    . SIMPLE MASTECTOMY WITH AXILLARY SENTINEL NODE BIOPSY Right 07/13/2020   Procedure: SIMPLE MASTECTOMY WITH AXILLARY SENTINEL NODE BIOPSY;  Surgeon: Robert Bellow, MD;  Location: [facility] ORS;  Service: General;  Laterality: Right;  . TONSILLECTOMY    . TUBAL LIGATION      There were no vitals filed for this visit.   Subjective Assessment - 09/07/20 1114    Subjective  Seen Dr Bary Castilla yesterday -not eating good, and lost he said about 8 lbs- he put me on some medication -  I did not get to  do my exercises as you would have liked - but trying - this new medication for my CA is not good for my depression - I think my shoulder motion is little better    Currently in Pain? Yes    Pain Score 2     Pain Location Shoulder    Pain Descriptors / Indicators Tightness;Sore    Pain Type Surgical pain    Pain Onset More than a month ago    Pain Frequency Intermittent               LYMPHEDEMA/ONCOLOGY QUESTIONNAIRE - 09/07/20 0001      Right Upper Extremity Lymphedema   15 cm Proximal to Olecranon Process 29 cm    10 cm Proximal to Olecranon Process 29 cm    Olecranon Process 24.5 cm             08/10/20 NOTE FROM DR Grayland Ormond:  PLAN:   1. 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 Patient completed 4 cycles of Adriamycin and Cytoxan. She only received 10 infusions of weekly Taxol her last one occurring on April 06, 2020. Given her persistently decreased performance status and worsening neuropathy, treatment was discontinued. CT scan of  the chest, abdomen, and pelvis on December 01, 2019 reviewed independently with no obvious evidence of metastatic disease. She underwent simple mastectomy on July 13, 2020 with significant improvement of her disease burden. She does not require XRT given her simple mastectomy. Proceed with letrozole today for minimum of 5 years completing treatment in December 2026. Given her high risk disease, could possibly extend treatment for 7 to 10 years. Patient will requirea baseline bone mineral density in the next 1 to 2 weeks. Return to clinic in 3 months for routine evaluation. 2. History of DCIS: Status post lumpectomy and XRT. Patient reports she did not take any adjuvant hormonal therapy. Given this history, have referred patient to genetic counseling for evaluation and testing. 3. Insomnia: Patient  does not complain of this today.Continue Xanax as needed. 4. Neutropenia: Resolved. 5. Hypokalemia: Mild.Patient admits to being noncompliant with oral potassium supplementation. 6. Tooth abscess: Resolved. Patient has been instructed to schedule a dental appointment in the near future. 7. Back pain: Chronic and unchanged.Patient was instructed to use ibuprofen sparingly. Continue tramadol as needed.  8. Myalgias/restless leg: Patient does not complain of this today. 9. Palpitations/syncope: Appreciate cardiology input. Continue follow-up with cardiology as scheduled.  10. Depression: Chronic and unchanged. Currently being managed by primary care. 11. Diarrhea: Resolved. 12. Decreased range of motion of right arm: Referral has been given to Occupational Therapy  OT SCREEN 08/17/2020: Pt arrive with having R mastectomy done 5 wks today - had some fluid drained off yesterday by Dr Bary Castilla Pt show decrease AROM for R shoulder flexion 120 , ABD 105 and external rotation over head - tight pect  Scar tissue adhere -still some scabs  Tightness under arm - circumference about same -pt is R hand dominant - will monitor Pt mostly doing house work and watch tv - not working - applied for disabiity  Pt was ed on scar massage  And HEP  pect stretch on wall - 10 reps hold 5 sec  And AAROM on wall for shoulder flexion - 10 reps ABD of shoulder on wall AAROM - 2 x 5 reps   Make sure stretch is under arm/axilla  Can also do pect stretch in supine and AROM for ext rotation in prone  Slight pull  10 reps  will re-assess in 2 wks - if not progress - will recommend OT evaluation and tx in outpt    OT SCREEN 09/07/2020:   Pt arrive with having R mastectomy 8 wks today - had some fluid drained off 08/16/20 by Dr Guerry Bruin had check up with Dr Bary Castilla- lost 8 lbs and not good appetite -and feels like her depression is worse since on last Chemo drug. Ask about if she needs to wait  to see Dr Grayland Ormond in March - talk with Dr Gary Fleet nurse- will discuss with Dr and call pt. Pt show increase flexion and external rotation since 08/17/20  AROM for R shoulder flexion 120 and increase to 135, ABD 105 about same - with pull in axilla and pect and external rotation over head - with more ease Scar tissue adhere still on medial side - lateral side improved greatly   Circumference in bilateral UE about same -pt is R hand dominant - will monitor Pt mostly watching tv lately - doing house work and watch tv - not working - applied for disabiity  Pt was ed on scar massage again And wanted OT to review HEP again  And AAROM on wall for shoulder flexion -  10 reps ABD of shoulder on wall AAROM - 2 x 5 reps   Make sure stretch is under arm/axilla  Cont with pect stretch in supine and AROM for ext rotation in supine Slight pull  10 reps  will re-assess in month  Refer to Dietician for check up because of weight lost CARE program because pt feels not like doing what she should at home because of depression, increase SOB , weight lost - pt in agreement                           Patient will benefit from skilled therapeutic intervention in order to improve the following deficits and impairments:           Visit Diagnosis: Stiffness of right shoulder, not elsewhere classified  Scar condition and fibrosis of skin    Problem List Patient Active Problem List   Diagnosis Date Noted  . Syncope and collapse 02/23/2020  . Palpitations 02/23/2020  . Essential hypertension 02/23/2020  . Goals of care, counseling/discussion 11/26/2019  . Primary cancer of upper outer quadrant of right female breast ([place]) 11/19/2019    Rosalyn Gess OTR/L,CLT 09/07/2020, 11:17 AM  [facility] [address] [place], [place], [place], [address] Phone: [phone]   Fax:  [phone]  Name: Kelsey Russo MRN:  [id number] Date of Birth: [birth year]

## 2020-09-08 ENCOUNTER — Telehealth: Payer: Self-pay

## 2020-09-08 NOTE — Telephone Encounter (Signed)
Nutrition  Referral received from [place], [place] regarding poor appetite and recent 8lb weight loss.   Called patient for nutrition assessment but not available.  Left message with call back number.   Mariyah Upshaw B. Freida Busman, RD, LDN Registered Dietitian [phone] (mobile)

## 2020-09-12 ENCOUNTER — Telehealth: Payer: Self-pay

## 2020-09-12 NOTE — Telephone Encounter (Signed)
Nutrition Assessment   Reason for Assessment: Referral from [facility] for weight loss and poor appetite   ASSESSMENT:  55 year old female with right breast cancer.  Past medical history of depression, HTN.  Patient s/p 4 cycles of adriamycin/cytoxan followed by 10 infusions of taxol (caused neuropathy).  Patient s/p simple mastectomy on 07/13/20.  Patient has started tamoxifen.  Patient returned RD's call.  Patient reports no appetite or desire to eat.  Reports that Dr Terri Piedra started reglan 5 mg TID and that has helped some.  Reports taste is off from treatment.  Reports some diarrhea this am but otherwise normal stool.  Usually just eats 1 meal per day (mostly chicken noodle soup).  Snacks on peanut butter crackers.  Reports some nausea but not much. Reports access to food (has food stamps).  Reports has tried shakes (ensure and carnation instant breakfast) reports that they are sweet, uses milk to decrease sweetness   Medications: reviewed   Labs: reviewed   Anthropometrics:   Height: 67 inches Weight: 143 lb on 12/8  132 lb today at PCP 133 lb last week at Dr Dwyane Luo office  7% weight loss in the last month, significant   Estimated Energy Needs  Kcals: 1500-1800 Protein: 75-90 g Fluid: 1.5 L   NUTRITION DIAGNOSIS: Inadequate oral intake related to cancer related treatment as evidenced by 7% weight loss in the last month and decreased appetite   INTERVENTION:  Encouraged setting schedule to eat/reminder, small frequent snacks. Reviewed ways to add calories and protein and will mail handout Will mail soft protein foods (poor dentition) Will make request to MD and team to send authorization to [place] for oral nutrition supplement coverage by Medicaid.  Recommend ensure plus BID for added calories and protein. Patient agreeable. Discussed strategies to help with taste change.  Will mail handout. Contact information provided   MONITORING, EVALUATION, GOAL: weight  trends, intake   Next Visit: Feb 7 phone call  Anjani Feuerborn B. Zenia Resides, [place], [place] Registered Dietitian [phone] (mobile)

## 2020-09-27 ENCOUNTER — Telehealth: Payer: Self-pay | Admitting: *Deleted

## 2020-09-27 NOTE — Telephone Encounter (Signed)
She can stop tamoxifen and have a video visit with [facility].  She may need a psych referral for her ongoing depression.

## 2020-09-27 NOTE — Telephone Encounter (Signed)
Advise patient to stop tamoxifen today per Dr. Grayland Ormond. Suggested that she seek assistance with depression through her PCP. Patient verbalized understanding.

## 2020-09-27 NOTE — Telephone Encounter (Signed)
Patient reports that since she has been taking tamoxifen her depression has worsened, she has sever mood swings and is angry all the time. She would like to talk with someone about the drug and side effects.

## 2020-09-28 ENCOUNTER — Inpatient Hospital Stay: Payer: Medicaid Other

## 2020-09-28 ENCOUNTER — Other Ambulatory Visit: Payer: Self-pay

## 2020-09-28 DIAGNOSIS — C50411 Malignant neoplasm of upper-outer quadrant of right female breast: Secondary | ICD-10-CM | POA: Diagnosis present

## 2020-09-28 DIAGNOSIS — Z452 Encounter for adjustment and management of vascular access device: Secondary | ICD-10-CM | POA: Diagnosis present

## 2020-09-28 DIAGNOSIS — Z95828 Presence of other vascular implants and grafts: Secondary | ICD-10-CM

## 2020-09-28 MED ORDER — SODIUM CHLORIDE 0.9% FLUSH
10.0000 mL | Freq: Once | INTRAVENOUS | Status: AC
  Administered 2020-09-28: 10 mL via INTRAVENOUS
  Filled 2020-09-28: qty 10

## 2020-09-28 MED ORDER — HEPARIN SOD (PORK) LOCK FLUSH 100 UNIT/ML IV SOLN
INTRAVENOUS | Status: AC
  Filled 2020-09-28: qty 5

## 2020-09-28 MED ORDER — HEPARIN SOD (PORK) LOCK FLUSH 100 UNIT/ML IV SOLN
500.0000 [IU] | Freq: Once | INTRAVENOUS | Status: AC
  Administered 2020-09-28: 500 [IU] via INTRAVENOUS
  Filled 2020-09-28: qty 5

## 2020-09-29 ENCOUNTER — Other Ambulatory Visit: Payer: Self-pay | Admitting: *Deleted

## 2020-09-29 MED ORDER — SERTRALINE HCL 25 MG PO TABS: 25.0000 mg | ORAL_TABLET | Freq: Every day | ORAL | 1 refills | Status: DC

## 2020-09-29 NOTE — Progress Notes (Signed)
Prescription has been sent.

## 2020-09-29 NOTE — Telephone Encounter (Signed)
Was patient offered [facility]? Sorry, was unclear. Thanks!

## 2020-09-29 NOTE — Progress Notes (Signed)
Lets start 25mg  zoloft daily.

## 2020-09-30 NOTE — Progress Notes (Signed)
Patient has medication.  Encouraged her to schedule Primary Care appointment to follow-up.

## 2020-10-05 ENCOUNTER — Inpatient Hospital Stay: Payer: Medicaid Other | Attending: Oncology | Admitting: Occupational Therapy

## 2020-10-05 DIAGNOSIS — M25611 Stiffness of right shoulder, not elsewhere classified: Secondary | ICD-10-CM

## 2020-10-05 DIAGNOSIS — C50411 Malignant neoplasm of upper-outer quadrant of right female breast: Secondary | ICD-10-CM

## 2020-10-05 NOTE — Progress Notes (Signed)
Survivorship Care Plan visit completed.  Treatment summary reviewed and given to patient.  ASCO answers booklet reviewed and given to patient.  CARE program and Cancer Transitions discussed with patient along with other resources cancer center offers to patients and caregivers.  Patient verbalized understanding.    Patient in agreement for APP to have a Virtual visit to introduce them to the Survivorship Clinic.  Encouraged patient to call for any questions or concerns. 

## 2020-10-05 NOTE — Therapy (Signed)
[facility] [address] [place], [place], [place], [address] Phone: [phone]   Fax:  [phone]  Occupational Therapy Screen  Patient Details  Name: Kelsey Russo MRN: [id number] Date of Birth: Apr 01, 1966 No data recorded  Encounter Date: 10/05/2020   OT End of Session - 10/05/20 1157    Visit Number 0           Past Medical History:  Diagnosis Date  . Anemia    during chemotherapy  . Brain tumor (benign) ([place])   . Breast cancer ([place]) 1999   Status post right breast lumpectomy and radiation  . COPD (chronic obstructive pulmonary disease) ([place])   . Depression   . History of chemotherapy 1999  . History of seizure disorder   . Hypertension   . Personal history of radiation therapy 1999  . Pneumonia   . PONV (postoperative nausea and vomiting)    vomiting    Past Surgical History:  Procedure Laterality Date  . BRAIN SURGERY    . Brain tumor resection  1991  . BREAST BIOPSY Right 1999   Positive  . BREAST BIOPSY Right 11/10/2019   [place] bx 10:00 heart marker, path pending  . BREAST BIOPSY Right 11/10/2019   [place] bx axilla, hydromark 4, path pending  . BREAST BIOPSY Left 11/10/2019   [place] bx 2:00, venus marker, path pending  . PORTACATH PLACEMENT Left 11/30/2019   Procedure: INSERTION PORT-A-CATH;  Surgeon: Robert Bellow, MD;  Location: [facility] ORS;  Service: General;  Laterality: Left;  . Right breast lumpectomy    . SIMPLE MASTECTOMY WITH AXILLARY SENTINEL NODE BIOPSY Right 07/13/2020   Procedure: SIMPLE MASTECTOMY WITH AXILLARY SENTINEL NODE BIOPSY;  Surgeon: Robert Bellow, MD;  Location: [facility] ORS;  Service: General;  Laterality: Right;  . TONSILLECTOMY    . TUBAL LIGATION      There were no vitals filed for this visit.   Subjective Assessment - 10/05/20 1156    Subjective  The dietician send me some things in mail - Boost to drink and other stuff - I do think I am going to go back to work in  April - I am schedule to see CARE program lady middle of this month - this last 3 wks was tough - I need to get out of the house -did some of your excercises - scar still pull under my arm    Currently in Pain? Yes    Pain Score 2     Pain Location Shoulder    Pain Orientation Right    Pain Descriptors / Indicators Tightness    Pain Type Surgical pain    Pain Onset More than a month ago    Pain Frequency Intermittent               LYMPHEDEMA/ONCOLOGY QUESTIONNAIRE - 10/05/20 0001      Right Upper Extremity Lymphedema   15 cm Proximal to Olecranon Process 29 cm    10 cm Proximal to Olecranon Process 28.5 cm    Olecranon Process 24.5 cm                08/10/20 NOTE FROM DR Grayland Ormond: PLAN:   1. 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 Patient completed 4 cycles of Adriamycin and Cytoxan. She only received 10 infusions of weekly Taxol her last one occurring on  April 06, 2020. Given her persistently decreased performance status and worsening neuropathy, treatment was discontinued. CT scan of the chest, abdomen, and pelvis on December 01, 2019 reviewed independently with no obvious evidence of metastatic disease. She underwent simple mastectomy on July 13, 2020 with significant improvement of her disease burden. She does not require XRT given her simple mastectomy. Proceed with letrozole today for minimum of 5 years completing treatment in December 2026. Given her high risk disease, could possibly extend treatment for 7 to 10 years. Patient will requirea baseline bone mineral density in the next 1 to 2 weeks. Return to clinic in 3 months for routine evaluation. 2. History of DCIS: Status post lumpectomy and XRT. Patient reports she did not take any adjuvant hormonal therapy. Given this history, have referred patient to genetic  counseling for evaluation and testing. 3. Insomnia: Patient does not complain of this today.Continue Xanax as needed. 4. Neutropenia: Resolved. 5. Hypokalemia: Mild.Patient admits to being noncompliant with oral potassium supplementation. 6. Tooth abscess: Resolved. Patient has been instructed to schedule a dental appointment in the near future. 7. Back pain: Chronic and unchanged.Patient was instructed to use ibuprofen sparingly. Continue tramadol as needed.  8. Myalgias/restless leg: Patient does not complain of this today. 9. Palpitations/syncope: Appreciate cardiology input. Continue follow-up with cardiology as scheduled.  10. Depression: Chronic and unchanged. Currently being managed by primary care. 11. Diarrhea: Resolved. 12. Decreased range of motion of right arm: Referral has been given to Occupational Therapy  OT SCREEN 08/17/2020: Pt arrive with having R mastectomy done 5 wks today - had some fluid drained off yesterday by Dr Bary Castilla Pt show decrease AROM for R shoulder flexion 120 , ABD 105 and external rotation over head - tight pect  Scar tissue adhere -still some scabs  Tightness under arm - circumference about same -pt is R hand dominant - will monitor Pt mostly doing house work and watch tv - not working - applied for disabiity  Pt was ed on scar massage  And HEP  pect stretch on wall - 10 reps hold 5 sec  And AAROM on wall for shoulder flexion - 10 reps ABD of shoulder on wall AAROM - 2 x 5 reps Make sure stretch is under arm/axilla  Can also do pect stretch in supine and AROM for ext rotation in prone  Slight pull  10 reps will re-assess in 2 wks - if not progress - will recommend OT evaluation and tx in outpt    OT SCREEN 09/07/2020:   Pt arrive with having R mastectomy 8 wks today - had some fluid drained off 08/16/20 by Dr Guerry Bruin had check up with Dr Bary Castilla- lost 8 lbs and not good appetite -and feels like her depression  is worse since on last Chemo drug. Ask about if she needs to wait to see Dr Grayland Ormond in March - talk with Dr Gary Fleet nurse- will discuss with Dr and call pt. Pt show increase flexion and external rotation since 08/17/20  AROM for R shoulder flexion 120 and increase to 135, ABD 105 about same - with pull in axilla and pect and external rotation over head - with more ease Scar tissue adhere still on medial side - lateral side improved greatly   Circumference in bilateral UE about same -pt is R hand dominant - will monitor Pt mostly watching tv lately - doing house work and watch tv - not working - applied for disabiity  Pt was ed on scar massage again  And wanted OT to review HEP again  And AAROM on wall for shoulder flexion - 10 reps ABD of shoulder on wall AAROM - 2 x 5 reps Make sure stretch is under arm/axilla  Cont with pect stretch in supine and AROM for ext rotation in supine Slight pull  10 reps will re-assess in month  Refer to Dietician for check up because of weight lost CARE program because pt feels not like doing what she should at home because of depression, increase SOB , weight lost - pt in agreement      OT SCREEN 10/05/20:  Pt arrive with having R mastectomy 12 wks today - had some fluid drained off 08/16/20 by Dr Bary Castilla Pt appear to feel better today -talking about going back to work in April -but wants to do the CARE program because it is cold and she needs to get out of the house - get stronger - did not do anything the last few months per pt. She is schedule to meet with CARE program middle of Febr. Pt show increase flexion ,ABD and external rotation close to WNL  AROM for R shoulder ABD still about 130 and pull under arm where scar tissue still adhere  OT done some scar tissue mobilitization and ed pt on doing at home and used mini massager this date -afterwards review AAROM on wall for ABD of shoulder - more ROM  Add protraction and retraction of scapula in  supine with can of food - 2 x 12 reps Scapula squeezes and shoulder extention - RTB 2 x 12 reps  Pt tolerate well - to cont at home with    Circumference in bilateral UE about same -pt is R hand dominant   Refer to Dietician last time and did had follow up and some things mailed to her  CARE program - pt to see them middle Febr Pt to contact me if need follow up                               Visit Diagnosis: Stiffness of right shoulder, not elsewhere classified    Problem List Patient Active Problem List   Diagnosis Date Noted  . Syncope and collapse 02/23/2020  . Palpitations 02/23/2020  . Essential hypertension 02/23/2020  . Goals of care, counseling/discussion 11/26/2019  . Primary cancer of upper outer quadrant of right female breast ([place]) 11/19/2019    Rosalyn Gess OTR/L,CLT 10/05/2020, 11:59 AM  [facility] [address] [place], [place], [place], [address] Phone: [phone]   Fax:  [phone]  Name: Kelsey Russo MRN: [id number] Date of Birth: 04/01/1966

## 2020-10-07 ENCOUNTER — Encounter: Payer: Self-pay | Admitting: Nurse Practitioner

## 2020-10-07 ENCOUNTER — Other Ambulatory Visit: Payer: Self-pay | Admitting: *Deleted

## 2020-10-07 ENCOUNTER — Inpatient Hospital Stay (HOSPITAL_BASED_OUTPATIENT_CLINIC_OR_DEPARTMENT_OTHER): Payer: Medicaid Other | Admitting: Nurse Practitioner

## 2020-10-07 DIAGNOSIS — Z79810 Long term (current) use of selective estrogen receptor modulators (SERMs): Secondary | ICD-10-CM

## 2020-10-07 DIAGNOSIS — Z170 Estrogen receptor positive status [ER+]: Secondary | ICD-10-CM | POA: Diagnosis not present

## 2020-10-07 DIAGNOSIS — F17210 Nicotine dependence, cigarettes, uncomplicated: Secondary | ICD-10-CM

## 2020-10-07 DIAGNOSIS — C50411 Malignant neoplasm of upper-outer quadrant of right female breast: Secondary | ICD-10-CM | POA: Diagnosis not present

## 2020-10-07 DIAGNOSIS — M810 Age-related osteoporosis without current pathological fracture: Secondary | ICD-10-CM

## 2020-10-07 DIAGNOSIS — Z0000 Encounter for general adult medical examination without abnormal findings: Secondary | ICD-10-CM

## 2020-10-07 DIAGNOSIS — Z9221 Personal history of antineoplastic chemotherapy: Secondary | ICD-10-CM

## 2020-10-07 NOTE — Progress Notes (Signed)
Survivorship Clinic Consult Note [facility]  [phone] [phone] Virtual Visit Progress Note  I connected with Kelsey Russo on 10/07/20 at 10:00 AM EST by video enabled telemedicine visit and verified that I am speaking with the correct person using two identifiers. Video failed and we converted to telephone visit.   I discussed the limitations, risks, security and privacy concerns of performing an evaluation and management service by telemedicine and the availability of in-person appointments. I also discussed with the patient that there may be a patient responsible charge related to this service. The patient expressed understanding and agreed to proceed.   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 the  Oncology History  Primary cancer of upper outer quadrant of right female breast ([place])  11/19/2019 Initial Diagnosis   Primary cancer of upper outer quadrant of right female breast ([place])   11/26/2019 Cancer Staging   Staging form: Breast, AJCC 8th Edition - Clinical stage from 11/26/2019: Stage IIIA (cT2, cN2a, cM0, G3, ER+, PR+, HER2-) - Signed by Lloyd Huger, MD on 11/26/2019   12/02/2019 -  Chemotherapy   The patient had DOXOrubicin (ADRIAMYCIN) chemo injection 114 mg, 60 mg/m2 = 114 mg, Intravenous,  Once, 4 of 4 cycles Administration: 114 mg (12/02/2019), 114 mg (12/16/2019), 114 mg (12/30/2019), 114 mg (01/13/2020) palonosetron (ALOXI) injection 0.25 mg, 0.25 mg, Intravenous,  Once, 4 of 4 cycles Administration: 0.25 mg (12/02/2019), 0.25 mg (12/16/2019), 0.25 mg (12/30/2019), 0.25 mg  (01/13/2020) pegfilgrastim-cbqv (UDENYCA) injection 6 mg, 6 mg, Subcutaneous, Once, 4 of 4 cycles Administration: 6 mg (12/04/2019), 6 mg (12/18/2019), 6 mg (01/01/2020) cyclophosphamide (CYTOXAN) 1,140 mg in sodium chloride 0.9 % 250 mL chemo infusion, 600 mg/m2 = 1,140 mg, Intravenous,  Once, 4 of 4 cycles Administration: 1,140 mg (12/02/2019), 1,140 mg (12/16/2019), 1,140 mg (12/30/2019), 1,140 mg (01/13/2020) PACLitaxel (TAXOL) 150 mg in sodium chloride 0.9 % 250 mL chemo infusion (</= $RemoveBefor'80mg'BySorDOGJvOD$ /m2), 80 mg/m2 = 150 mg, Intravenous,  Once, 10 of 12 cycles Administration: 150 mg (02/03/2020), 150 mg (02/10/2020), 150 mg (02/17/2020), 150 mg (02/24/2020), 150 mg (03/02/2020), 150 mg (03/09/2020), 150 mg (03/16/2020), 150 mg (03/23/2020), 150 mg (03/30/2020), 150 mg (04/06/2020) fosaprepitant (EMEND) 150 mg in sodium chloride 0.9 % 145 mL IVPB, 150 mg, Intravenous,  Once, 4 of 4 cycles Administration: 150 mg (12/02/2019), 150 mg (12/16/2019), 150 mg (12/30/2019), 150 mg (01/13/2020)  for chemotherapy treatment.      INTERVAL HISTORY:  Patient presents to the survivorship clinic today for initial meeting to review her survivorship care plan detailing her treatment course for breast cancer, as well as monitoring long-term side effects of that treatment, education regarding health maintenance, screening, and overall wellness and health promotion.  On tamoxifen. Currently off for 2 weeks. On Zoloft while starting zoloft. Off xanax. On Klonopin per pcp. Trazodone for sleep. Worse since treatment. Haven't worked at all since port placement. Works in [employer] center which required heavy lifting. Out of work for a year. Excited but reluctant to return to work. Still gets tired easily, improves with rest. Still has not regained the weight she lost. Sees dietician. Doesn't weigh at home. Low appetite. Awaiting starting care program. Wants to get port  out. Has hearing loss since treatment that is mild.   REVIEW OF SYSTEMS:   Review of Systems  Constitutional: Positive for malaise/fatigue. Negative for chills, diaphoresis, fever and weight loss (stabilized).  HENT: Negative for congestion, ear discharge, ear pain, hearing loss, nosebleeds, sinus pain, sore throat and tinnitus.   Eyes: Negative for blurred vision, double vision, pain and redness.  Respiratory: Negative for cough, hemoptysis, sputum production, shortness of breath, wheezing and stridor.   Cardiovascular: Negative for chest pain, palpitations and leg swelling.  Gastrointestinal: Positive for constipation and diarrhea. Negative for abdominal pain, blood in stool, melena, nausea and vomiting.  Genitourinary: Negative for dysuria and urgency.  Musculoskeletal: Positive for joint pain (shoulder pain). Negative for back pain, falls and myalgias.  Skin: Negative for itching and rash.  Neurological: Positive for sensory change (hearing changes). Negative for dizziness, tingling, loss of consciousness, weakness and headaches.  Endo/Heme/Allergies: Negative for environmental allergies. Does not bruise/bleed easily.  Psychiatric/Behavioral: Positive for depression. The patient is nervous/anxious and has insomnia.   All other systems reviewed and are negative. Breast: No new lumps, bumps, skin changes, discomfort, or discharge. No nipple changes.   ONCOLOGY TREATMENT TEAM:  1. Surgeon:  Dr. Bary Castilla 2. Medical Oncologist: Dr. Grayland Ormond   PAST MEDICAL/SURGICAL HISTORY:  Past Medical History:  Diagnosis Date  . Anemia    during chemotherapy  . Brain tumor (benign) ([place])   . Breast cancer ([place]) 1999   Status post right breast lumpectomy and radiation  . COPD (chronic obstructive pulmonary disease) ([place])   . Depression   . History of chemotherapy 1999  . History of seizure disorder   . Hypertension   . Personal history of radiation therapy 1999  . Pneumonia   . PONV (postoperative nausea and vomiting)    vomiting   Past Surgical History:  Procedure  Laterality Date  . BRAIN SURGERY    . Brain tumor resection  1991  . BREAST BIOPSY Right 1999   Positive  . BREAST BIOPSY Right 11/10/2019   [place] bx 10:00 heart marker, path pending  . BREAST BIOPSY Right 11/10/2019   [place] bx axilla, hydromark 4, path pending  . BREAST BIOPSY Left 11/10/2019   [place] bx 2:00, venus marker, path pending  . PORTACATH PLACEMENT Left 11/30/2019   Procedure: INSERTION PORT-A-CATH;  Surgeon: Robert Bellow, MD;  Location: [facility] OR[facility];  Service: General;  Laterality: Left;  . Right breast lumpectomy    . SIMPLE MASTECTOMY WITH AXILLARY SENTINEL NODE BIOPSY Right 07/13/2020   Procedure: SIMPLE MASTECTOMY WITH AXILLARY SENTINEL NODE BIOPSY;  Surgeon: Robert Bellow, MD;  Location: [facility] OR[facility];  Service: General;  Laterality: Right;  . TONSILLECTOMY    . TUBAL LIGATION      SOCIAL HISTORY:  Social History   Socioeconomic History  . Marital status: Married    Spouse name: Not on file  . Number of children: Not on file  . Years of education: Not on file  . Highest education level: Not on file  Occupational History  . Not on file  Tobacco Use  . Smoking status: Current Every Day Smoker    Packs/day: 0.25    Years: 40.00    Pack years: 10.00    Types: Cigarettes  . Smokeless tobacco: Never Used  Vaping Use  . Vaping Use: Never used  Substance and Sexual Activity  . Alcohol use: No    Alcohol/week: 0.0 standard drinks  . Drug use: Yes    Frequency: 3.0 times per  week    Types: Marijuana  . Sexual activity: Not on file  Other Topics Concern  . Not on file  Social History Narrative   Lives at home with husband.   Social Determinants of Health   Financial Resource Strain: Not on file  Food Insecurity: Not on file  Transportation Needs: Not on file  Physical Activity: Not on file  Stress: Not on file  Social Connections: Not on file   Immunization History  Administered Date(s) Administered  . Influenza-Unspecified 06/24/2019  Covid vaccine x 2 &  booster   ALLERGIES:  Allergies  Allergen Reactions  . Imitrex [Sumatriptan] Anaphylaxis  . Penicillins Rash    Did it involve swelling of the face/tongue/throat, SOB, or low BP? Unknown Did it involve sudden or severe rash/hives, skin peeling, or any reaction on the inside of your mouth or nose? Unknown Did you need to seek medical attention at a hospital or doctor's office? Unknown When did it last happen?? If all above answers are "NO", may proceed with cephalosporin use.     CURRENT MEDICATIONS:  Outpatient Encounter Medications as of 10/07/2020  Medication Sig Note  . acetaminophen (TYLENOL) 500 MG tablet Take 500-1,000 mg by mouth every 6 (six) hours as needed for moderate pain.    Marland Kitchen albuterol (VENTOLIN HFA) 108 (90 Base) MCG/ACT inhaler Inhale 1-2 puffs into the lungs every 6 (six) hours as needed for wheezing or shortness of breath.   Marland Kitchen alendronate (FOSAMAX) 70 MG tablet Take 1 tablet (70 mg total) by mouth once a week. Take with a full glass of water on an empty stomach.   . ALPRAZolam (XANAX) 0.25 MG tablet Take 1 tablet (0.25 mg total) by mouth at bedtime as needed for anxiety. (Patient not taking: Reported on 07/13/2020)   . amLODipine (NORVASC) 5 MG tablet Take 1 tablet (5 mg total) by mouth daily. (Patient not taking: Reported on 05/27/2020)   . clonazePAM (KLONOPIN) 1 MG tablet Take 1 mg by mouth 3 (three) times daily as needed for anxiety.   . diphenoxylate-atropine (LOMOTIL) 2.5-0.025 MG tablet TAKE 1 TABLET BY MOUTH 4 TIMES DAILY AS NEEDED FOR DIARRHEA OR LOOSE STOOLS (Patient not taking: Reported on 06/29/2020)   . gabapentin (NEURONTIN) 100 MG capsule Take 100 mg by mouth 2 (two) times daily.   Marland Kitchen HYDROcodone-acetaminophen (NORCO/VICODIN) 5-325 MG tablet Take 1 tablet by mouth every 4 (four) hours as needed for moderate pain. (Patient not taking: Reported on 08/10/2020)   . letrozole (FEMARA) 2.5 MG tablet Take 1 tablet (2.5 mg total) by mouth daily.   Marland Kitchen losartan (COZAAR)  100 MG tablet Take 100 mg by mouth daily.   . nicotine (NICODERM CQ - DOSED IN MG/24 HOURS) 14 mg/24hr patch Place 14 mg onto the skin daily. (Patient not taking: Reported on 07/19/2020)   . ondansetron (ZOFRAN) 4 MG tablet Take 1 tablet (4 mg total) by mouth every 8 (eight) hours as needed for nausea or vomiting. (Patient not taking: Reported on 07/13/2020) 06/29/2020: Has not been picked up yet  . ondansetron (ZOFRAN) 8 MG tablet TAKE 1 TABLET BY MOUTH 2 TIMES DAILY AS NEEDED   . oxyCODONE (OXY IR/ROXICODONE) 5 MG immediate release tablet Take 5 mg by mouth every 6 (six) hours as needed for severe pain. PRN BID   . potassium chloride SA (KLOR-CON) 20 MEQ tablet Take 1 tablet (20 mEq total) by mouth 2 (two) times daily. (Patient not taking: Reported on 06/29/2020)   . prochlorperazine (COMPAZINE) 10 MG  tablet TAKE 1 TABLET BY MOUTH EVERY 6 HOURS AS NEEDED (NAUSEA OR VOMITING) (Patient not taking: Reported on 06/29/2020)   . Pseudoeph-Doxylamine-DM-APAP (NYQUIL PO) Take 1-2 capsules by mouth at bedtime as needed (sleep.).    Marland Kitchen sertraline (ZOLOFT) 25 MG tablet Take 1 tablet (25 mg total) by mouth daily.   . tamoxifen (NOLVADEX) 20 MG tablet Take 1 tablet (20 mg total) by mouth daily.   Marland Kitchen tetrahydrozoline 0.05 % ophthalmic solution Place 1-2 drops into both eyes 3 (three) times daily as needed (dry/irritated eyes).   . traMADol (ULTRAM) 50 MG tablet Take 1 tablet (50 mg total) by mouth 2 (two) times daily. (Patient not taking: Reported on 08/10/2020)   . traZODone (DESYREL) 100 MG tablet Take 100 mg by mouth at bedtime.    Facility-Administered Encounter Medications as of 10/07/2020  Medication  . 0.9 %  sodium chloride infusion  . sodium chloride flush (NS) 0.9 % injection 10 mL  . sodium chloride flush (NS) 0.9 % injection 10 mL    ONCOLOGIC FAMILY HISTORY:  Family History  Problem Relation Age of Onset  . Depression Mother   . CAD Father   . Hypertension Father   . Lung cancer Father   .  Coronary artery disease Father 57  . Breast cancer Maternal Grandmother     GENETIC COUNSELING/TESTING: Was referred but not yet seen. Will reach out to genetics to discuss.   PHYSICAL EXAMINATION:  Limited due to virtual exam.    LABORATORY DATA:  No results found for: LABCA2   ASSESSMENT & PLAN:  Ms. Swingler is a pleasant 55 y.o. female with diagnosed with stage IIIa breast cancer treated with neoadjuvant chemotherapy, 4 cycles of Adriamycin and Cytoxan & 10 weekly infusion of Taxol discontinued due to decreased performance status, worsening neuropathy. CT showed no obvious metastatic disease. Underwent mastectomy on 07/13/20 with significant improvement in disease burden. No adjuvant XRT given mastectomy.  Started on letrozole however baseline bone mineral density scan showed osteoporosis and she was switched to tamoxifen.  She will complete a minimum of 5 years of treatment in December 2026.  Given her high risk disease possibly extended for up to 10 years.  Additionally, she has a history of DCIS status post lumpectomy and XRT.  Did not take adjuvant hormonal therapy.  She presents to the Survivorship clinic for our initial meeting and routine follow-up since completing treatment. We reviewed her survivorship care plan and addressed any acute survivorship concerns since completing treatment.   1. History of breast cancer: Ms. Shimer is continuing to recover from definitive treatment for breast cancer.  Today, she received a copy of her comprehensive survivorship care plan (SCP) which was reviewed with her in detail.  The SCP details her cancer diagnosis, treatment course, potential late/long-term effects of treatments appropriate follow-up care with recommendations for future, and patient education resources.  A copy of this summary, along with a letter will be sent to the patient's primary care provider via mail/backslash in basket after today's visit. We encouraged her to continue to  follow-up with her health care team to continue to monitor help and manage any effects of treatment.  We discussed that there is a chance for the cancer can come back.  The vast majority of recurrences will be detected in the 2-3 years after treatment though late recurrences can occur.  The recommended surveillance for follow-up after diagnosis was reviewed in detail but depending on risk, we generally recommend follow up with breast exam  every 3-6 months for the first 2 years then every 6-12 months for 3-5 years then annually thereafter.  We discussed that the goal of surveillance after treatment is to detect disease if it returns as early as possible.  The most useful tools for detecting recurrence include a thorough evaluation of symptoms and a physical exam which includes a complete breast exam and yearly mammograms or other imaging studies. Additional imaging may be considered based on symptoms or examination findings which is why is it is important to report concerns to her health care team.  Blood work may be performed at these visits for if there are symptoms or exam findings concerning for recurrence.  Patient was advised that if she feels that something is not right she should schedule an appointment to be evaluated.  Concerning symptoms discussed in detail.   Ms. Tapp will return to the survivorship clinic as needed and continue scheduled followups with her healthcare team as outlined in her SCP.   2. Survivorship Care Survey: Survivorship Care Survey completed post-treatment as recommended by the NCCN Survivorship Guidelines. Patient's answers were reviewed. Based on these, as well as the type of cancer and treatment she underwent, the following Survivorship Topics were discussed:   - Cardiac Toxicity- Patient received Adriamycin chemotherapy as part of her cancer treatment. We discussed that Adriamycin induced heart failure can take many years or decades to develop.  - Anxiety, Depression, and  Distress-patient continues to have anxiety and mood alterations.  Slightly improved since holding tamoxifen and starting Zoloft.  She is followed by her PCP for this.  Plan is to restart tamoxifen in a couple of weeks.  Discussed referral to psychiatry of she wishes. - Cognitive Function-patient complains of ongoing "brain fog".  Suspect somewhat related to ongoing stress and recovery from treatments.  She has not yet returned to normal activities or baseline level of health.  Encouraged her to establish a normal routine and include exercise.  If ongoing symptoms she can be seen in symptom management clinic - Fatigue-ongoing problem.  Suspect related to inactivity, recovery from treatment, poor nutritional status.  Lifestyle options discussed. - Lymphedema-currently receiving physical therapy postmastectomy - Hormone-Related Symptoms-mood swings are most bothersome.  Likely related to tamoxifen.  Continue to monitor.  In the ER/PR positivity of her cancer would consider nonhormonal management options - Pain-ongoing problem.  Continue physical therapy.  Patient wishes to avoid narcotics if possible. - Sexual Function-not discussed - Sleep Disorder-chronic insomnia.  She has stopped Xanax and has been started on trazodone by her PCP.  Reports somewhat improved sleep.  Discussed sleep hygiene - Healthy Lifestyle-see below - Immunizations and Infections-patient reports she is up-to-date on her vaccines including COVID-19 vaccine and booster.  Some common side effects of treatment may include postsurgical pain, fatigue, lymphedema, menopause, fluid retention, weight gain, changes in sexuality, pain with intercourse, vaginal dryness, depression/anxiety, and numbness/tingling.  2. Port-a-Cath: Patient had port placed for administration of chemotherapy. It remains in place at this time. Discussed the need to flush the port every 6 to 8 weeks.  She is eager to have her port removed and says this was okayed by  medical oncology.  We will send referral to surgery to have port removed.  3. Smoking cessation/Tobacco Avoidance:  She is attempting smoking cessation again today. She has started nicoderm. Struggles with cravings. Discussed [employer] and smoking cessation. Will make referrals today.   4. Cancer screening/Health & Wellness Promotion:  Due to Ms. Freeberg's history and her age,  she should receive screening for skin cancers, colon cancer, and gynecologic cancers. The information and recommendations are listed on the patient's comprehensive care plan/treatment summary and were reviewed in detail with the patient.  I encouraged her to speak with her PCP about arranging these and performing annual wellness exams, as appropriate.    - Colorectal cancer-she has not yet had a colonoscopy.  For the average risk patient, screening for colon cancer is recommended beginning at age 23.  Various screening strategies exist including colonoscopy, sigmoidoscopy, stool testing for blood, etc. she wishes to proceed with colonoscopy and I will make referral to GI today.  5. Osteoporosis-baseline bone mineral density scan consistent with osteoporosis.  She was switched from letrozole to tamoxifen.  Also started on Fosamax.  I encouraged compliance.  Also recommended additional supplementation of calcium 1200 mg and vitamin D [phone] IU daily along with weightbearing exercise as tolerated.  We will plan to repeat bone mineral density scan in 1 year which will be coordinated by medical oncology.  6. Physical activity/Healthy eating: Getting adequate physical activity and maintaining a healthy diet as a cancer survivor is important for overall wellness and reduces the risk of cancer recurrence.   We reviewed the "Nutrition Rainbow" handout, as well as the handout "Take Control of Your Health and Reduce Your Cancer Risk" from the [place].  She was also encouraged to engage in moderate to vigorous exercise for 30  minutes per day most days of the week.  She has been referred to the care program and plans to participate.  Also encouraged home exercise.  We also discussed the importance and health benefits of maintaining a healthy weight and eating a balanced diet. Ms. Ake was encouraged to consume 5-7 servings of fruits and vegetables per day. We discussed that a healthy BMI is 18.5-24.9 and that maintaining a health weight reduces risk of cancer recurrences.   7. Support services/Counseling: Ms. Rodenberg was seen today in in effort to address both the physical and social concerns of our cancer survivors at [facility] at [facility]. It is not uncommon for this period of the patient's cancer care trajectory to be one of many emotions and stressors.  I provided support today through active listening, validation of concerns, and expressive supportive counseling.  Ms. Broski was encouraged to take advantage of our support services programs and support groups to better cope in her new life as a cancer survivor after completing anti-cancer treatment. We also discussed Counseling Services available to Cancer Survivors through [facility]. She declines a referral at this time but one can be considered at any time.   8. Genetic Testing- we will reach out to genetics to follow-up on recommendation for genetic testing given her history of breast cancer and previous history of DCIS.  9. Weight loss- continue follow up with Jennet Maduro for ongoing monitoring of weight and trends.   Dispo:  - Return to [facility] for follow-up with Medical Oncology as scheduled - Return to survivorship clinic as needed; no additional follow-up needed at this time.   - referral to gynecology for annual pelvic exams in setting of tamoxifen - referral for colonoscopy - referral for port removal - referral to genetics - referral to smoking cessation  I discussed the assessment and treatment plan with the patient. The patient was  provided an opportunity to ask questions and all were answered. The patient agreed with the plan and demonstrated an understanding of the instructions.   The patient was  advised to call back or seek an in-person evaluation if the symptoms worsen or if the condition fails to improve as anticipated.   I spent 30 minutes on this telephone encounter. Video connection was lost at >50% of the duration of the visit, at which time the remainder of the visit was completed via audio only.  Beckey Rutter, DNP, AGNP-C [place] at [facility]  Note: [place], [place], [place] [phone]

## 2020-10-07 NOTE — Patient Instructions (Signed)

## 2020-10-10 ENCOUNTER — Inpatient Hospital Stay: Payer: Medicaid Other

## 2020-10-10 NOTE — Progress Notes (Signed)
Nutrition Follow-up:  Patient with right breast cancer.  Patient has completed chemotherapy and radiation.  Simple mastectomy on 07/13/20.  Patient on tamoxifen.  Spoke with patient via phone for follow-up.  Patient reports that appetite is a little bit better.  Some days eats more than others.  Reports that boost/ensure shakes are rich and some days can only drink 1 sometimes 2 a day.  Reports that her evening meal is the best meal of the day usually.  Eating soups, meatloaf, chicken pie, peanut butter crackers.    Planning to start CARE program.     Medications: reveiwed  Labs: reviewed  Anthropometrics:   No new weight since 12/8 of 143 lb   NUTRITION DIAGNOSIS: Inadequate oral intake improving   INTERVENTION:  Reviewed ways to add calories and protein to diet. Encouraged continued use of oral nutrition supplements. ([employer] able to provide) Patient has contact information    MONITORING, EVALUATION, GOAL: weight trends, intake   NEXT VISIT: Mar 7 phone f/u  Kelsey Russo, [place], [place] Registered Dietitian [phone] (mobile)

## 2020-10-13 ENCOUNTER — Telehealth: Payer: Medicaid Other

## 2020-10-24 ENCOUNTER — Ambulatory Visit: Payer: Self-pay | Admitting: Obstetrics

## 2020-10-25 ENCOUNTER — Encounter: Payer: Medicaid Other | Attending: Internal Medicine

## 2020-10-25 ENCOUNTER — Other Ambulatory Visit: Payer: Self-pay

## 2020-10-25 VITALS — Ht 65.5 in | Wt 130.3 lb

## 2020-10-25 DIAGNOSIS — C50411 Malignant neoplasm of upper-outer quadrant of right female breast: Secondary | ICD-10-CM

## 2020-10-25 NOTE — Progress Notes (Signed)
Daily Session Note  Patient Details  Name: Kelsey Russo MRN: [id number] Date of Birth: 08-Jun-1966 Referring Provider:    Encounter Date: 10/25/2020  Check In:  Session Check In - 10/25/20 1357      Check-In   Supervising physician immediately available to respond to emergencies See telemetry face sheet for immediately available ER MD    Location [facility]    Staff Present Coralie Keens, MS Exercise Physiologist;Jessica Luan Pulling, MA, RCEP, CCRP, CCET    Virtual Visit No    Medication changes reported     No    Fall or balance concerns reported    No    Tobacco Cessation No Change    Current number of cigarettes/nicotine per day     20   States about 1 pack per day   Warm-up and Cool-down Not performed (comment)   6MWT and Gym Orientation   Resistance Training Performed No   6MWT   VAD Patient? No    PAD/SET Patient? No      Pain Assessment   Currently in Pain? No/denies            6 Minute Walk    Row Name 10/25/20 1401         6 Minute Walk   Phase Initial     Distance 1070 feet     Walk Time 6 minutes     # of Rest Breaks 0     MPH 2.02     METS 3.76     RPE 12     Perceived Dyspnea  1     VO2 Peak 13.17     Symptoms No     Resting HR 85 bpm     Resting BP 122/82     Resting Oxygen Saturation  97 %     Exercise Oxygen Saturation  during 6 min walk 87 %     Max Ex. HR 99 bpm     Max Ex. BP 158/80     2 Minute Post BP 136/78             Social History   Tobacco Use  Smoking Status Current Every Day Smoker  . Packs/day: 0.25  . Years: 40.00  . Pack years: 10.00  . Types: Cigarettes  Smokeless Tobacco Never Used   Kelsey Russo is a current tobacco user. Intervention for tobacco cessation was provided at the initial medical review. She was asked about readiness to quit and reported she is ready to quit but has no set date yet. Patient was advised and educated about tobacco cessation using combination therapy, tobacco cessation  classes, quit line, and quit smoking apps. Patient demonstrated understanding of this material. Staff will continue to provide encouragement and follow up with the patient throughout the program.      Goals Met:  Exercise tolerated well Personal goals reviewed Queuing for purse lip breathing No report of cardiac concerns or symptoms  Goals Unmet:  Not Applicable  Comments: First full day of orientation.  All starting workloads are established based on the results of the 6 minute walk test done at initial orientation visit.  The plan for exercise progression was also introduced and progression will be customized based on patient's performance and goals.  Advised patient to monitor O2 levels at home, possible purchase a pulse ox if able to.   Dr. Emily Filbert is Medical Director for [place] and [facility].

## 2020-10-27 ENCOUNTER — Other Ambulatory Visit: Payer: Self-pay | Admitting: Oncology

## 2020-11-01 ENCOUNTER — Ambulatory Visit: Payer: Self-pay | Admitting: Obstetrics

## 2020-11-03 ENCOUNTER — Telehealth: Payer: Self-pay

## 2020-11-03 NOTE — Telephone Encounter (Signed)
Patient called- was nervous she was going to lose her spot in CARE as she has not attended since orientation. Re-assured we can place her on a small hold and can still attend the program. Patient states she has had more anxiety this week, followed by GI effects and switching medications. Advised her to call her doctor today if she would need to be seen sooner or today given above symptoms. Patient agreed and will call us next week with any updates.

## 2020-11-05 NOTE — Progress Notes (Signed)
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  INTERVAL HISTORY: Patient returns to clinic today for routine 42-month evaluation.  Letrozole was discontinued secondary to osteoporosis and patient was placed on tamoxifen.  She had some personality changes with tamoxifen, but these have improved since initiating Zoloft.  She is also tolerating Fosamax well without significant side effects.  She has had weight loss in the interim, but states her appetite has improved and she is drinking boost supplements twice per day.  She does not complain of peripheral neuropathy today and has no neurologic complaints.  She does not complain of weakness or fatigue today.  She has a good appetite.  She denies any recent fevers or illnesses. She has no chest pain, shortness of breath, cough, or hemoptysis.  She denies any nausea, vomiting, constipation, or diarrhea.  She has no urinary complaints.  Patient offers no further specific complaints today.  REVIEW OF SYSTEMS:   Review of Systems  Constitutional: Positive for weight loss. Negative for fever and malaise/fatigue.  HENT: Negative for sore throat.   Respiratory: Negative.  Negative for cough, hemoptysis and shortness of breath.   Cardiovascular: Negative.  Negative for chest pain, palpitations and leg swelling.  Gastrointestinal:  Negative for abdominal pain, diarrhea, nausea and vomiting.  Genitourinary: Negative.  Negative for dysuria.  Musculoskeletal: Negative.  Negative for back pain and myalgias.  Skin: Negative.  Negative for rash.  Neurological: Negative.  Negative for dizziness, sensory change, speech change, focal weakness, weakness and headaches.  Psychiatric/Behavioral: Negative.  Negative for depression. The patient is not nervous/anxious and does not have insomnia.     As per HPI. Otherwise, a complete review of systems is negative.  PAST MEDICAL HISTORY: Past Medical History:  Diagnosis Date  . Anemia    during chemotherapy  . Brain tumor (benign) ([place])   . Breast cancer ([place]) 1999   Status post right breast lumpectomy and radiation  . COPD (chronic obstructive pulmonary disease) ([place])   . Depression   . History of chemotherapy 1999  . History of seizure disorder   . Hypertension   . Personal history of radiation therapy 1999  . Pneumonia   . PONV (postoperative nausea and vomiting)    vomiting    PAST SURGICAL HISTORY: Past Surgical History:  Procedure Laterality Date  . BRAIN SURGERY    . Brain tumor resection  1991  . BREAST BIOPSY Right 1999   Positive  . BREAST BIOPSY Right 11/10/2019   [place] bx 10:00 heart marker, path pending  . BREAST BIOPSY Right 11/10/2019   [place] bx axilla, hydromark 4, path pending  . BREAST BIOPSY Left 11/10/2019   [place] bx 2:00, venus marker, path pending  . PORTACATH PLACEMENT Left 11/30/2019   Procedure: INSERTION PORT-A-CATH;  Surgeon: Robert Bellow, MD;  Location: [facility] ORS;  Service: General;  Laterality: Left;  . Right breast lumpectomy    . SIMPLE MASTECTOMY WITH AXILLARY SENTINEL NODE  BIOPSY Right 07/13/2020   Procedure: SIMPLE MASTECTOMY WITH AXILLARY SENTINEL NODE BIOPSY;  Surgeon: Robert Bellow, MD;  Location: [facility] ORS;  Service: General;  Laterality: Right;  . TONSILLECTOMY    . TUBAL LIGATION      FAMILY HISTORY: Family History   Problem Relation Age of Onset  . Depression Mother   . CAD Father   . Hypertension Father   . Lung cancer Father   . Coronary artery disease Father 49  . Breast cancer Maternal Grandmother     ADVANCED DIRECTIVES (Y/N):  N  HEALTH MAINTENANCE: Social History   Tobacco Use  . Smoking status: Current Every Day Smoker    Packs/day: 0.25    Years: 40.00    Pack years: 10.00    Types: Cigarettes  . Smokeless tobacco: Never Used  Vaping Use  . Vaping Use: Never used  Substance Use Topics  . Alcohol use: No    Alcohol/week: 0.0 standard drinks  . Drug use: Yes    Frequency: 3.0 times per week    Types: Marijuana     Colonoscopy:  PAP:  Bone density:  Lipid panel:  Allergies  Allergen Reactions  . Imitrex [Sumatriptan] Anaphylaxis  . Penicillins Rash    Did it involve swelling of the face/tongue/throat, SOB, or low BP? Unknown Did it involve sudden or severe rash/hives, skin peeling, or any reaction on the inside of your mouth or nose? Unknown Did you need to seek medical attention at a hospital or doctor's office? Unknown When did it last happen?? If all above answers are "NO", may proceed with cephalosporin use.     Current Outpatient Medications  Medication Sig Dispense Refill  . acetaminophen (TYLENOL) 500 MG tablet Take 500-1,000 mg by mouth every 6 (six) hours as needed for moderate pain.     Marland Kitchen albuterol (VENTOLIN HFA) 108 (90 Base) MCG/ACT inhaler Inhale 1-2 puffs into the lungs every 6 (six) hours as needed for wheezing or shortness of breath.    Marland Kitchen alendronate (FOSAMAX) 70 MG tablet Take 1 tablet (70 mg total) by mouth once a week. Take with a full glass of water on an empty stomach. 4 tablet 3  . clonazePAM (KLONOPIN) 1 MG tablet Take 1 mg by mouth 3 (three) times daily as needed for anxiety.    . gabapentin (NEURONTIN) 100 MG capsule Take 100 mg by mouth 2 (two) times daily.    Marland Kitchen losartan (COZAAR) 100 MG tablet Take 100 mg by mouth daily.    . ondansetron  (ZOFRAN) 8 MG tablet TAKE 1 TABLET BY MOUTH 2 TIMES DAILY AS NEEDED 60 tablet 2  . Pseudoeph-Doxylamine-DM-APAP (NYQUIL PO) Take 1-2 capsules by mouth at bedtime as needed (sleep.).     Marland Kitchen sertraline (ZOLOFT) 25 MG tablet TAKE 1 TABLET BY MOUTH ONCE A DAY 30 tablet 1  . tamoxifen (NOLVADEX) 20 MG tablet Take 1 tablet (20 mg total) by mouth daily. 30 tablet 3  . tetrahydrozoline 0.05 % ophthalmic solution Place 1-2 drops into both eyes 3 (three) times daily as needed (dry/irritated eyes).    . traMADol (ULTRAM) 50 MG tablet Take 1 tablet (50 mg total) by mouth 2 (two) times daily. 60 tablet 0  . traZODone (DESYREL) 100 MG tablet Take 100 mg by mouth at bedtime.    Marland Kitchen amLODipine (NORVASC) 5 MG tablet Take 1 tablet (5 mg total) by mouth daily. (Patient not taking: Reported on 05/27/2020) 30 tablet 6  . HYDROcodone-acetaminophen (NORCO/VICODIN) 5-325 MG tablet Take  1 tablet by mouth every 4 (four) hours as needed for moderate pain. (Patient not taking: Reported on 08/10/2020) 20 tablet 0  . letrozole (FEMARA) 2.5 MG tablet Take 1 tablet (2.5 mg total) by mouth daily. 30 tablet 3  . nicotine (NICODERM CQ - DOSED IN MG/24 HOURS) 14 mg/24hr patch Place 14 mg onto the skin daily. (Patient not taking: No sig reported)    . oxyCODONE (OXY IR/ROXICODONE) 5 MG immediate release tablet Take 5 mg by mouth every 6 (six) hours as needed for severe pain. PRN BID    . potassium chloride SA (KLOR-CON) 20 MEQ tablet Take 1 tablet (20 mEq total) by mouth 2 (two) times daily. (Patient not taking: No sig reported) 30 tablet 1   No current facility-administered medications for this visit.   Facility-Administered Medications Ordered in Other Visits  Medication Dose Route Frequency Provider Last Rate Last Admin  . 0.9 %  sodium chloride infusion   Intravenous Once Lloyd Huger, MD 500 mL/hr at 04/06/20 1025 Rate Verify at 04/06/20 1025  . sodium chloride flush (NS) 0.9 % injection 10 mL  10 mL Intravenous PRN Lloyd Huger, MD      . sodium chloride flush (NS) 0.9 % injection 10 mL  10 mL Intravenous PRN Lloyd Huger, MD   10 mL at 01/13/20 0851    OBJECTIVE: Vitals:   11/09/20 1046  BP: 118/90  Pulse: 92  Resp: 18  Temp: 98.8 F (37.1 C)  SpO2: 100%     Body mass index is 21.71 kg/m.    ECOG FS:0 - Asymptomatic  General: Well-developed, well-nourished, no acute distress. Eyes: Pink conjunctiva, anicteric sclera. HEENT: Normocephalic, moist mucous membranes. Lungs: No audible wheezing or coughing. Heart: Regular rate and rhythm. Abdomen: Soft, nontender, no obvious distention. Musculoskeletal: No edema, cyanosis, or clubbing. Neuro: Alert, answering all questions appropriately. Cranial nerves grossly intact. Skin: No rashes or petechiae noted. Psych: Normal affect.   LAB RESULTS:  Lab Results  Component Value Date   NA 136 07/20/2020   K 3.4 (L) 07/20/2020   CL 103 07/20/2020   CO2 24 07/20/2020   GLUCOSE 97 07/20/2020   BUN 9 07/20/2020   CREATININE 0.67 07/20/2020   CALCIUM 9.0 07/20/2020   PROT 6.8 07/20/2020   ALBUMIN 3.7 07/20/2020   AST 17 07/20/2020   ALT 9 07/20/2020   ALKPHOS 81 07/20/2020   BILITOT 0.5 07/20/2020   GFRNONAA >60 07/20/2020   GFRAA >60 04/20/2020    Lab Results  Component Value Date   WBC 7.8 07/20/2020   NEUTROABS 5.8 07/20/2020   HGB 12.6 07/20/2020   HCT 36.6 07/20/2020   MCV 91.0 07/20/2020   PLT 226 07/20/2020     STUDIES: No results found.  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   PLAN:    1. 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 She completed 4 cycles of Adriamycin and Cytoxan.  She only received 10 infusions of weekly Taxol her last one occurring on April 06, 2020.  Given her persistently  decreased performance status and worsening neuropathy, treatment was discontinued. CT scan of the chest, abdomen, and pelvis on December 01, 2019 reviewed independently with no obvious evidence of metastatic disease.  She underwent simple mastectomy on July 13, 2020 with significant improvement of her  disease burden.  She did not require XRT given her simple mastectomy.  Letrozole was discontinued secondary to osteoporosis and patient was initiated on tamoxifen which she will require to take for a minimum of 5 years completing treatment in December 2026. Given her high risk disease, could possibly extend treatment for 7 to 10 years.  Return to clinic in 3 months for routine evaluation. 2.  History of DCIS: Status post lumpectomy and XRT.  Patient reports she did not take any adjuvant hormonal therapy.  Given this history, have referred patient to genetic counseling for evaluation and testing. 3.  Insomnia: Patient does not complain of this today.  Continue Xanax as needed. 4.  Neutropenia: Resolved. 5.  Hypokalemia: Mild.  Patient admits to being noncompliant with oral potassium supplementation. 6.  Tooth abscess: Patient is once again having dental pain.  Follow-up with primary care tomorrow and consider referral to [place] clinic secondary to cost. 7.  Back pain: Chronic and unchanged.  Patient was instructed to use ibuprofen sparingly.  Continue tramadol as needed.   8.  Myalgias/restless leg: Patient does not complain of this today. 9.  Palpitations/syncope: Appreciate cardiology input.  Continue follow-up with cardiology as scheduled.   10.  Depression: Improved.  Continue Zoloft 25 mg daily. 11. Decreased range of motion of right arm: Improved. 12.  Port: Okay for port removal.  Referral has been sent to surgery.  Patient expressed understanding and was in agreement with this plan. She also understands that She can call clinic at any time with any questions, concerns, or complaints.   Cancer  Staging Primary cancer of upper outer quadrant of right female breast [facility]) Staging form: Breast, AJCC 8th Edition - Clinical stage from 11/26/2019: Stage IIIA (cT2, cN2a, cM0, G3, ER+, PR+, HER2-) - Signed by Lloyd Huger, MD on 11/26/2019 Stage prefix: Initial diagnosis Histologic grading system: 3 grade system   Lloyd Huger, MD   11/09/2020 11:57 AM

## 2020-11-07 ENCOUNTER — Inpatient Hospital Stay: Payer: Medicaid Other | Attending: Oncology

## 2020-11-07 ENCOUNTER — Ambulatory Visit: Payer: Medicaid Other | Admitting: Oncology

## 2020-11-07 DIAGNOSIS — R634 Abnormal weight loss: Secondary | ICD-10-CM | POA: Insufficient documentation

## 2020-11-07 DIAGNOSIS — M549 Dorsalgia, unspecified: Secondary | ICD-10-CM | POA: Insufficient documentation

## 2020-11-07 DIAGNOSIS — Z803 Family history of malignant neoplasm of breast: Secondary | ICD-10-CM | POA: Insufficient documentation

## 2020-11-07 DIAGNOSIS — F32A Depression, unspecified: Secondary | ICD-10-CM | POA: Insufficient documentation

## 2020-11-07 DIAGNOSIS — E876 Hypokalemia: Secondary | ICD-10-CM | POA: Insufficient documentation

## 2020-11-07 DIAGNOSIS — F17210 Nicotine dependence, cigarettes, uncomplicated: Secondary | ICD-10-CM | POA: Insufficient documentation

## 2020-11-07 DIAGNOSIS — Z79810 Long term (current) use of selective estrogen receptor modulators (SERMs): Secondary | ICD-10-CM | POA: Insufficient documentation

## 2020-11-07 DIAGNOSIS — C50411 Malignant neoplasm of upper-outer quadrant of right female breast: Secondary | ICD-10-CM | POA: Insufficient documentation

## 2020-11-07 DIAGNOSIS — M810 Age-related osteoporosis without current pathological fracture: Secondary | ICD-10-CM | POA: Insufficient documentation

## 2020-11-07 DIAGNOSIS — G8929 Other chronic pain: Secondary | ICD-10-CM | POA: Insufficient documentation

## 2020-11-07 DIAGNOSIS — Z79899 Other long term (current) drug therapy: Secondary | ICD-10-CM | POA: Insufficient documentation

## 2020-11-07 DIAGNOSIS — Z170 Estrogen receptor positive status [ER+]: Secondary | ICD-10-CM | POA: Insufficient documentation

## 2020-11-07 DIAGNOSIS — R55 Syncope and collapse: Secondary | ICD-10-CM | POA: Insufficient documentation

## 2020-11-07 DIAGNOSIS — R002 Palpitations: Secondary | ICD-10-CM | POA: Insufficient documentation

## 2020-11-07 DIAGNOSIS — K047 Periapical abscess without sinus: Secondary | ICD-10-CM | POA: Insufficient documentation

## 2020-11-07 DIAGNOSIS — F1290 Cannabis use, unspecified, uncomplicated: Secondary | ICD-10-CM | POA: Insufficient documentation

## 2020-11-07 NOTE — Progress Notes (Signed)
Nutrition Follow-up:  Patient with right breast cancer.  Patient has completed chemotherapy and radiation.  Simple mastectomy on 07/13/20.  Patient on tamoxifen.  Spoke with patient via phone for follow-up.  Patient reports appetite is better.  Still reports eating 1 meal in the evening and snacking mostly during the day.  Drinks 1-2 boost shakes per day.  "They are rich." Reports that she has connected with the CARE program and wants to go this week. Reports that she has to improve her strength to be able to take care of her 57 year old and infant grand-babies.     Medications: reviewed  Labs: reviewed  Anthropometrics:   Weight 2/22 130 lb decreased from 143 lb on 12/8   NUTRITION DIAGNOSIS: Inadequate oral intake improving   INTERVENTION:  Encouraged involvement in CARE program. Can help improve anxiety, appetite.   Since appetite is better encouraged meal at lunch time vs snack.  Patient needs to increase calories and protein to see change in weight.  Patient does not watch news to help with anxiety, talks with daughter.      MONITORING, EVALUATION, GOAL: weight trends, intake   NEXT VISIT: April 11 phone call  Mark Benecke B. Zenia Resides, [place], [place] Registered Dietitian [phone] (mobile)

## 2020-11-08 ENCOUNTER — Other Ambulatory Visit: Payer: Self-pay

## 2020-11-08 ENCOUNTER — Encounter: Payer: Medicaid Other | Attending: Internal Medicine

## 2020-11-08 ENCOUNTER — Other Ambulatory Visit: Payer: Self-pay | Admitting: General Surgery

## 2020-11-08 DIAGNOSIS — C50411 Malignant neoplasm of upper-outer quadrant of right female breast: Secondary | ICD-10-CM | POA: Insufficient documentation

## 2020-11-08 DIAGNOSIS — Z1231 Encounter for screening mammogram for malignant neoplasm of breast: Secondary | ICD-10-CM

## 2020-11-08 NOTE — Progress Notes (Signed)
Daily Session Note  Patient Details  Name: Kelsey Russo MRN: [id number] Date of Birth: 26-Jun-1966 Referring Provider:   April Manson [facility] from 10/25/2020 in [facility]  Referring Provider Delight Hoh      Encounter Date: 11/08/2020  Check In:  Session Check In - 11/08/20 1404      Check-In   Supervising physician immediately available to respond to emergencies See telemetry face sheet for immediately available ER MD    Location [facility]    Staff Present Coralie Keens, MS Exercise Physiologist;Jessica Luan Pulling, MA, RCEP, CCRP, CCET    Virtual Visit No    Medication changes reported     No    Fall or balance concerns reported    No    Tobacco Cessation No Change    Warm-up and Cool-down Performed on first and last piece of equipment    Resistance Training Performed Yes    VAD Patient? No    PAD/SET Patient? No              Social History   Tobacco Use  Smoking Status Current Every Day Smoker  . Packs/day: 0.25  . Years: 40.00  . Pack years: 10.00  . Types: Cigarettes  Smokeless Tobacco Never Used    Goals Met:  Independence with exercise equipment Exercise tolerated well No report of cardiac concerns or symptoms Strength training completed today  Goals Unmet:  Not Applicable  Comments: First full day of exercise!  Patient was oriented to gym and equipment including functions, settings, policies, and procedures.  Patient's individual exercise prescription and treatment plan were reviewed.  All starting workloads were established based on the results of the 6 minute walk test done at initial orientation visit.  The plan for exercise progression was also introduced and progression will be customized based on patient's performance and goals.   Dr. Emily Filbert is Medical Director for [place] and [facility].

## 2020-11-09 ENCOUNTER — Encounter: Payer: Self-pay | Admitting: Oncology

## 2020-11-09 ENCOUNTER — Telehealth: Payer: Self-pay

## 2020-11-09 ENCOUNTER — Inpatient Hospital Stay: Payer: Medicaid Other

## 2020-11-09 ENCOUNTER — Inpatient Hospital Stay (HOSPITAL_BASED_OUTPATIENT_CLINIC_OR_DEPARTMENT_OTHER): Payer: Medicaid Other | Admitting: Oncology

## 2020-11-09 VITALS — BP 118/90 | HR 92 | Temp 98.8°F | Resp 18 | Wt 132.5 lb

## 2020-11-09 DIAGNOSIS — R002 Palpitations: Secondary | ICD-10-CM | POA: Diagnosis not present

## 2020-11-09 DIAGNOSIS — R634 Abnormal weight loss: Secondary | ICD-10-CM | POA: Diagnosis not present

## 2020-11-09 DIAGNOSIS — Z79899 Other long term (current) drug therapy: Secondary | ICD-10-CM | POA: Diagnosis not present

## 2020-11-09 DIAGNOSIS — Z170 Estrogen receptor positive status [ER+]: Secondary | ICD-10-CM | POA: Diagnosis not present

## 2020-11-09 DIAGNOSIS — C50411 Malignant neoplasm of upper-outer quadrant of right female breast: Secondary | ICD-10-CM

## 2020-11-09 DIAGNOSIS — M810 Age-related osteoporosis without current pathological fracture: Secondary | ICD-10-CM | POA: Diagnosis not present

## 2020-11-09 DIAGNOSIS — G8929 Other chronic pain: Secondary | ICD-10-CM | POA: Diagnosis not present

## 2020-11-09 DIAGNOSIS — K047 Periapical abscess without sinus: Secondary | ICD-10-CM | POA: Diagnosis not present

## 2020-11-09 DIAGNOSIS — R55 Syncope and collapse: Secondary | ICD-10-CM | POA: Diagnosis not present

## 2020-11-09 DIAGNOSIS — F1290 Cannabis use, unspecified, uncomplicated: Secondary | ICD-10-CM | POA: Diagnosis not present

## 2020-11-09 DIAGNOSIS — Z803 Family history of malignant neoplasm of breast: Secondary | ICD-10-CM | POA: Diagnosis not present

## 2020-11-09 DIAGNOSIS — F17210 Nicotine dependence, cigarettes, uncomplicated: Secondary | ICD-10-CM | POA: Diagnosis not present

## 2020-11-09 DIAGNOSIS — E876 Hypokalemia: Secondary | ICD-10-CM | POA: Diagnosis not present

## 2020-11-09 DIAGNOSIS — M549 Dorsalgia, unspecified: Secondary | ICD-10-CM | POA: Diagnosis not present

## 2020-11-09 DIAGNOSIS — Z95828 Presence of other vascular implants and grafts: Secondary | ICD-10-CM

## 2020-11-09 DIAGNOSIS — Z79810 Long term (current) use of selective estrogen receptor modulators (SERMs): Secondary | ICD-10-CM | POA: Diagnosis not present

## 2020-11-09 DIAGNOSIS — F32A Depression, unspecified: Secondary | ICD-10-CM | POA: Diagnosis not present

## 2020-11-09 MED ORDER — SODIUM CHLORIDE 0.9% FLUSH
10.0000 mL | Freq: Once | INTRAVENOUS | Status: AC
  Administered 2020-11-09: 10 mL via INTRAVENOUS
  Filled 2020-11-09: qty 10

## 2020-11-09 MED ORDER — HEPARIN SOD (PORK) LOCK FLUSH 100 UNIT/ML IV SOLN
500.0000 [IU] | Freq: Once | INTRAVENOUS | Status: AC
  Administered 2020-11-09: 500 [IU] via INTRAVENOUS
  Filled 2020-11-09: qty 5

## 2020-11-09 NOTE — Progress Notes (Signed)
Patient here for oncology follow-up appointment, expresses concerns of numbness and port removal

## 2020-11-10 ENCOUNTER — Encounter: Payer: Medicaid Other | Admitting: Licensed Clinical Social Worker

## 2020-11-10 ENCOUNTER — Other Ambulatory Visit: Payer: Medicaid Other

## 2020-11-15 ENCOUNTER — Other Ambulatory Visit (HOSPITAL_COMMUNITY)
Admission: RE | Admit: 2020-11-15 | Discharge: 2020-11-15 | Disposition: A | Discharge status: Home / Self Care | Payer: Medicaid Other | Source: Ambulatory Visit | Attending: Obstetrics | Admitting: Obstetrics

## 2020-11-15 ENCOUNTER — Ambulatory Visit (INDEPENDENT_AMBULATORY_CARE_PROVIDER_SITE_OTHER): Payer: Medicaid Other | Admitting: Obstetrics

## 2020-11-15 ENCOUNTER — Encounter: Payer: Self-pay | Admitting: Obstetrics

## 2020-11-15 ENCOUNTER — Other Ambulatory Visit: Payer: Self-pay

## 2020-11-15 VITALS — BP 100/70 | Ht 66.0 in | Wt 141.0 lb

## 2020-11-15 DIAGNOSIS — Z01419 Encounter for gynecological examination (general) (routine) without abnormal findings: Secondary | ICD-10-CM | POA: Insufficient documentation

## 2020-11-15 DIAGNOSIS — N898 Other specified noninflammatory disorders of vagina: Secondary | ICD-10-CM | POA: Insufficient documentation

## 2020-11-15 DIAGNOSIS — Z0000 Encounter for general adult medical examination without abnormal findings: Secondary | ICD-10-CM | POA: Diagnosis not present

## 2020-11-15 DIAGNOSIS — B3731 Acute candidiasis of vulva and vagina: Secondary | ICD-10-CM

## 2020-11-15 DIAGNOSIS — B373 Candidiasis of vulva and vagina: Secondary | ICD-10-CM | POA: Diagnosis not present

## 2020-11-15 DIAGNOSIS — C50411 Malignant neoplasm of upper-outer quadrant of right female breast: Secondary | ICD-10-CM

## 2020-11-15 DIAGNOSIS — Z124 Encounter for screening for malignant neoplasm of cervix: Secondary | ICD-10-CM | POA: Diagnosis not present

## 2020-11-15 MED ORDER — FLUCONAZOLE 150 MG PO TABS: 150.0000 mg | ORAL_TABLET | Freq: Once | ORAL | 0 refills | Status: AC

## 2020-11-15 NOTE — Progress Notes (Signed)
Routine Annual Gynecology Examination   PCP: Perrin Maltese, MD  Chief Complaint:  Chief Complaint  Patient presents with  . Annual Exam    History of Present Illness: Patient is a 55 y.o. [id number] presents for annual exam. The patient complains of a vaginal discharge that is slightly itchy. She has been on antibioitcs and thinks she may have yeast.   Kelsey Russo is a new patient who is overdue for a GYN physical. She is in her second round of chemo for breast Cancer, and had a Mastectomy of the right breast previously.   Menopausal bleeding: denies  Menopausal symptoms: denies  Breast symptoms: denies - See Problem list. Right breast removed Last pap smear: 1 years ago.  Result Normal  Last mammogram: 1 years ago.  Result Abnormal- she is presently under treatment for breast Cancer  Past Medical History:  Diagnosis Date  . Anemia    during chemotherapy  . Brain tumor (benign) ([place])   . Breast cancer ([place]) 1999   Status post right breast lumpectomy and radiation  . COPD (chronic obstructive pulmonary disease) ([place])   . Depression   . History of chemotherapy 1999  . History of seizure disorder   . Hypertension   . Personal history of radiation therapy 1999  . Pneumonia   . PONV (postoperative nausea and vomiting)    vomiting    Past Surgical History:  Procedure Laterality Date  . BRAIN SURGERY    . Brain tumor resection  1991  . BREAST BIOPSY Right 1999   Positive  . BREAST BIOPSY Right 11/10/2019   [place] bx 10:00 heart marker, path pending  . BREAST BIOPSY Right 11/10/2019   [place] bx axilla, hydromark 4, path pending  . BREAST BIOPSY Left 11/10/2019   [place] bx 2:00, venus marker, path pending  . PORTACATH PLACEMENT Left 11/30/2019   Procedure: INSERTION PORT-A-CATH;  Surgeon: Robert Bellow, MD;  Location: [facility] ORS;  Service: General;  Laterality: Left;  . Right breast lumpectomy    . SIMPLE MASTECTOMY WITH AXILLARY SENTINEL NODE BIOPSY Right 07/13/2020   Procedure:  SIMPLE MASTECTOMY WITH AXILLARY SENTINEL NODE BIOPSY;  Surgeon: Robert Bellow, MD;  Location: [facility] ORS;  Service: General;  Laterality: Right;  . TONSILLECTOMY    . TUBAL LIGATION      Prior to Admission medications   Medication Sig Start Date End Date Taking? Authorizing Provider  acetaminophen (TYLENOL) 500 MG tablet Take 500-1,000 mg by mouth every 6 (six) hours as needed for moderate pain.    Yes [provider]  albuterol (VENTOLIN HFA) 108 (90 Base) MCG/ACT inhaler Inhale 1-2 puffs into the lungs every 6 (six) hours as needed for wheezing or shortness of breath.   Yes [provider]  alendronate (FOSAMAX) 70 MG tablet Take 1 tablet (70 mg total) by mouth once a week. Take with a full glass of water on an empty stomach. 08/25/20  Yes Lloyd Huger, MD  clonazePAM (KLONOPIN) 1 MG tablet Take 1 mg by mouth 3 (three) times daily as needed for anxiety.   Yes [provider]  gabapentin (NEURONTIN) 100 MG capsule Take 100 mg by mouth 2 (two) times daily.   Yes [provider]  losartan (COZAAR) 100 MG tablet Take 100 mg by mouth daily.   Yes [provider]  nicotine (NICODERM CQ - DOSED IN MG/24 HOURS) 14 mg/24hr patch Place 14 mg onto the skin daily.   Yes [provider]  ondansetron [facility])  8 MG tablet TAKE 1 TABLET BY MOUTH 2 TIMES DAILY AS NEEDED 08/12/20  Yes Finnegan, Kathlene November, MD  potassium chloride SA (KLOR-CON) 20 MEQ tablet Take 1 tablet (20 mEq total) by mouth 2 (two) times daily. 04/06/20  Yes Lloyd Huger, MD  Pseudoeph-Doxylamine-DM-APAP (NYQUIL PO) Take 1-2 capsules by mouth at bedtime as needed (sleep.).    Yes [provider]  sertraline (ZOLOFT) 25 MG tablet TAKE 1 TABLET BY MOUTH ONCE A DAY 10/28/20  Yes Lloyd Huger, MD  tamoxifen (NOLVADEX) 20 MG tablet Take 1 tablet (20 mg total) by mouth daily. 08/25/20  Yes Lloyd Huger, MD  tetrahydrozoline 0.05 % ophthalmic solution Place 1-2  drops into both eyes 3 (three) times daily as needed (dry/irritated eyes).   Yes [provider]  traMADol (ULTRAM) 50 MG tablet Take 1 tablet (50 mg total) by mouth 2 (two) times daily. 03/31/20  Yes Lloyd Huger, MD  traZODone (DESYREL) 100 MG tablet Take 100 mg by mouth at bedtime.   Yes [provider]  amLODipine (NORVASC) 5 MG tablet Take 1 tablet (5 mg total) by mouth daily. Patient not taking: Reported on 05/27/2020 03/02/20 05/31/20  End, Harrell Gave, MD  HYDROcodone-acetaminophen (NORCO/VICODIN) 5-325 MG tablet Take 1 tablet by mouth every 4 (four) hours as needed for moderate pain. Patient not taking: Reported on 08/10/2020 07/13/20 07/13/21  Robert Bellow, MD  letrozole [facility]) 2.5 MG tablet Take 1 tablet (2.5 mg total) by mouth daily. 08/10/20   Lloyd Huger, MD  oxyCODONE (OXY IR/ROXICODONE) 5 MG immediate release tablet Take 5 mg by mouth every 6 (six) hours as needed for severe pain. PRN BID    [provider]    Allergies  Allergen Reactions  . Imitrex [Sumatriptan] Anaphylaxis  . Penicillins Rash    Did it involve swelling of the face/tongue/throat, SOB, or low BP? Unknown Did it involve sudden or severe rash/hives, skin peeling, or any reaction on the inside of your mouth or nose? Unknown Did you need to seek medical attention at a hospital or doctor's office? Unknown When did it last happen?? If all above answers are "NO", may proceed with cephalosporin use.     Gynecologic History:  No LMP recorded. Patient is postmenopausal. Contraception: post menopausal status Last Pap: 2021. Results were: normal Last mammogram: 2021. Results were: abnormal Hx of right breast mastectomy. Currently under treatment for   Obstetric History: [id number]  Social History   Socioeconomic History  . Marital status: Married    Spouse name: Not on file  . Number of children: Not on file  . Years of education: Not on file  . Highest education  level: Not on file  Occupational History  . Not on file  Tobacco Use  . Smoking status: Current Every Day Smoker    Packs/day: 0.25    Years: 40.00    Pack years: 10.00    Types: Cigarettes  . Smokeless tobacco: Never Used  Vaping Use  . Vaping Use: Never used  Substance and Sexual Activity  . Alcohol use: No    Alcohol/week: 0.0 standard drinks  . Drug use: Yes    Frequency: 3.0 times per week    Types: Marijuana  . Sexual activity: Not on file  Other Topics Concern  . Not on file  Social History Narrative   Lives at home with husband.   Social Determinants of Health   Financial Resource Strain: Not on file  Food Insecurity: Not  on file  Transportation Needs: Not on file  Physical Activity: Not on file  Stress: Not on file  Social Connections: Not on file  Intimate Partner Violence: Not on file    Family History  Problem Relation Age of Onset  . Depression Mother   . CAD Father   . Hypertension Father   . Lung cancer Father   . Coronary artery disease Father 62  . Breast cancer Maternal Grandmother     Review of Systems  Constitutional: Positive for malaise/fatigue.  HENT: Negative.   Eyes: Negative.   Respiratory: Negative.   Cardiovascular: Negative.   Gastrointestinal: Positive for constipation.  Genitourinary: Negative.   Musculoskeletal: Negative.   Skin: Negative.   Neurological: Negative.   Endo/Heme/Allergies: Negative.   Psychiatric/Behavioral: The patient is nervous/anxious.      Physical Exam Vitals: BP 100/70   Ht 5\' 6"  (1.676 m)   Wt 141 lb (64 kg)   BMI 22.76 kg/m   Physical Exam Genitourinary:     Vulva normal.     Genitourinary Comments: No rashes or lesions noted. Uterus is anteverted, non enlarged. Minimal vaginal discharge.Nuswab sent No adnexal tenderness      Vaginal discharge present.  HENT:     Nose: Nose normal.  Cardiovascular:     Rate and Rhythm: Regular rhythm.     Heart sounds: Normal heart sounds.  Pulmonary:      Effort: Pulmonary effort is normal.     Breath sounds: Normal breath sounds.  Abdominal:     General: Bowel sounds are normal.     Palpations: Abdomen is soft.  Musculoskeletal:        General: Normal range of motion.     Cervical back: Neck supple.  Neurological:     General: No focal deficit present.     Mental Status: She is oriented to person, place, and time.  Skin:    General: Skin is warm and dry.  Psychiatric:        Behavior: Behavior normal.      Female chaperone present for pelvic and breast  portions of the physical exam  Results Nuswab pending Pap smear retrieved.   Assessment and Plan:  55 y.o. [phone] female here for routine annual gynecologic examination Hx of Breast CA, in second round of treatment Suspect yeast vaginitis  Plan: Problem List Items Addressed This Visit   None     Screening: -- Blood pressure screen normal -- Colonoscopy - due - managed by PCP -- Mammogram - NA, as she is under treatment for Breat CA presently. -- Weight screening: normal -- Depression screening negative (PHQ-9) -- Nutrition: normal -- cholesterol screening: per PCP -- osteoporosis screening: not due -- tobacco screening: addressed her smoking, and she is not interested in quitting. -- alcohol screening: AUDIT questionnaire indicates low-risk usage. -- family history of breast cancer screening: done. not at high risk. -- no evidence of domestic violence or intimate partner violence. -- STD screening: gonorrhea/chlamydia NAAT not collected per patient request. -- pap smear collected per ASCCP guidelines -- flu vaccine pr her PCP -- HPV vaccination series: not eligilbe   Patient provided emotional support and encouragement as she continues with chemotheropy and radiation. RTC in one year for next annual.   Imagene Riches, Mallory Shirk  11/25/2020 9:19 AM   11/15/2020 3:41 PM

## 2020-11-15 NOTE — Progress Notes (Signed)
Daily Session Note  Patient Details  Name: Kelsey Russo MRN: [id number] Date of Birth: [birth year] Referring Provider:   April Manson [facility] from 10/25/2020 in [facility]  Referring Provider Delight Hoh      Encounter Date: 11/15/2020  Check In:  Session Check In - 11/15/20 1233      Check-In   Supervising physician immediately available to respond to emergencies See telemetry face sheet for immediately available ER MD    Location [facility]    Staff Present Birdie Sons, MPA, RN;Jessica Luan Pulling, MA, RCEP, CCRP, CCET    Virtual Visit No    Medication changes reported     No    Fall or balance concerns reported    No    Tobacco Cessation No Change    Warm-up and Cool-down Performed on first and last piece of equipment    Resistance Training Performed Yes    VAD Patient? No    PAD/SET Patient? No      Pain Assessment   Currently in Pain? No/denies              Social History   Tobacco Use  Smoking Status Current Every Day Smoker  . Packs/day: 0.25  . Years: 40.00  . Pack years: 10.00  . Types: Cigarettes  Smokeless Tobacco Never Used    Goals Met:  Independence with exercise equipment Exercise tolerated well No report of cardiac concerns or symptoms Strength training completed today  Goals Unmet:  Not Applicable  Comments: Pt able to follow exercise prescription today without complaint.  Will continue to monitor for progression.    Dr. Emily Filbert is Medical Director for [place] and [facility].

## 2020-11-16 ENCOUNTER — Other Ambulatory Visit: Payer: Self-pay | Admitting: Oncology

## 2020-11-17 ENCOUNTER — Other Ambulatory Visit: Payer: Self-pay | Admitting: Obstetrics

## 2020-11-17 DIAGNOSIS — B373 Candidiasis of vulva and vagina: Secondary | ICD-10-CM

## 2020-11-17 DIAGNOSIS — B3731 Acute candidiasis of vulva and vagina: Secondary | ICD-10-CM

## 2020-11-17 LAB — CERVICOVAGINAL ANCILLARY ONLY
Bacterial Vaginitis (gardnerella): NEGATIVE
Candida Glabrata: NEGATIVE
Candida Vaginitis: POSITIVE — AB
Comment: NEGATIVE
Comment: NEGATIVE
Comment: NEGATIVE

## 2020-11-17 MED ORDER — FLUCONAZOLE 150 MG PO TABS: 150.0000 mg | ORAL_TABLET | Freq: Once | ORAL | 0 refills | Status: DC

## 2020-11-17 NOTE — Progress Notes (Signed)
Phone call made to patient regarding her + Candida result. Rx for Diflucan sent to pharmacy. Left the patient a voicemail instructing her about the medication and to take one tab and repeat in 3-5 days PRN. Imagene Riches, CNM  11/17/2020 12:17 PM

## 2020-11-22 ENCOUNTER — Encounter: Payer: Self-pay | Admitting: Obstetrics

## 2020-11-22 LAB — IGP, APTIMA HPV: HPV Aptima: NEGATIVE

## 2020-11-23 ENCOUNTER — Inpatient Hospital Stay: Payer: Medicaid Other

## 2020-11-23 ENCOUNTER — Inpatient Hospital Stay: Payer: Medicaid Other | Admitting: Licensed Clinical Social Worker

## 2020-11-24 ENCOUNTER — Telehealth: Payer: Self-pay | Admitting: *Deleted

## 2020-11-24 ENCOUNTER — Telehealth: Payer: Self-pay | Admitting: Oncology

## 2020-11-24 NOTE — Telephone Encounter (Signed)
Patient called requesting to speak with one of hte NP's about some issues, She reports inability to eat, dizziness and weakness. Please return her call. [phone]

## 2020-11-24 NOTE — Telephone Encounter (Signed)
I can call her.   Faythe Casa, NP 11/24/2020 12:31 PM

## 2020-11-24 NOTE — Telephone Encounter (Signed)
Re: Dizziness  Mrs. Kelsey Russo is a 55 year old female with past medical history significant for hypertension, palpitations right upper quadrant breast cancer.  She is followed by Dr. Grayland Ormond and was last evaluated on 11/09/2020.  She completed 4 cycles of AC and 10 cycles of weekly Taxol on 12/23/19 and due to decreased performance status and neuropathy treatment was discontinued altogether.  CT scan of chest abdomen and pelvis on 12/01/2019 did not reveal any evidence of metastatic disease.  Underwent simple mastectomy on 07/13/2020 with improvement of burden of disease.  She did not require XRT.  She was started on letrozole and most recently switched to tamoxifen due to osteoporosis.  She developed personality changes on tamoxifen and was started on Zoloft.  Symptoms appear to be improved.  She was also started on Fosamax for osteoporosis.  Plan is for her to complete tamoxifen in December 2026.  Patient called clinic this morning expressing concern over dizziness, weakness and problems eating. Symptoms have been present for months but have become progressively worse over the past several weeks.  She was recently treated for a tooth infection with clindamycin by her PCP for 10 days which she has completed 2 to 3 days ago.  She is unable to eat or drink secondary to persistent nausea.  She feels dizzy and weak likely due to dehydration.  She is asking to be seen in clinic.   Patient accepted appointment for tomorrow and symptom management at 10 AM for lab work and 1015 for [facility].   Faythe Casa, NP 11/24/2020 12:51 PM

## 2020-11-25 ENCOUNTER — Inpatient Hospital Stay: Payer: Medicaid Other

## 2020-11-25 ENCOUNTER — Inpatient Hospital Stay (HOSPITAL_BASED_OUTPATIENT_CLINIC_OR_DEPARTMENT_OTHER): Payer: Medicaid Other | Admitting: Oncology

## 2020-11-25 ENCOUNTER — Encounter: Payer: Self-pay | Admitting: Oncology

## 2020-11-25 ENCOUNTER — Other Ambulatory Visit: Payer: Self-pay | Admitting: *Deleted

## 2020-11-25 ENCOUNTER — Other Ambulatory Visit: Payer: Self-pay

## 2020-11-25 VITALS — BP 113/80 | HR 82 | Temp 98.7°F | Resp 20 | Wt 127.8 lb

## 2020-11-25 DIAGNOSIS — R110 Nausea: Secondary | ICD-10-CM | POA: Diagnosis not present

## 2020-11-25 DIAGNOSIS — R112 Nausea with vomiting, unspecified: Secondary | ICD-10-CM

## 2020-11-25 DIAGNOSIS — R634 Abnormal weight loss: Secondary | ICD-10-CM | POA: Diagnosis not present

## 2020-11-25 DIAGNOSIS — R42 Dizziness and giddiness: Secondary | ICD-10-CM

## 2020-11-25 DIAGNOSIS — C50411 Malignant neoplasm of upper-outer quadrant of right female breast: Secondary | ICD-10-CM

## 2020-11-25 LAB — COMPREHENSIVE METABOLIC PANEL
ALT: 18 U/L (ref 0–44)
AST: 20 U/L (ref 15–41)
Albumin: 3.6 g/dL (ref 3.5–5.0)
Alkaline Phosphatase: 64 U/L (ref 38–126)
Anion gap: 7 (ref 5–15)
BUN: 8 mg/dL (ref 6–20)
CO2: 20 mmol/L — ABNORMAL LOW (ref 22–32)
Calcium: 8.4 mg/dL — ABNORMAL LOW (ref 8.9–10.3)
Chloride: 103 mmol/L (ref 98–111)
Creatinine, Ser: 0.59 mg/dL (ref 0.44–1.00)
GFR, Estimated: >60 mL/min (ref >60)
Glucose, Bld: 86 mg/dL (ref 70–99)
Potassium: 4.2 mmol/L (ref 3.5–5.1)
Sodium: 130 mmol/L — ABNORMAL LOW (ref 135–145)
Total Bilirubin: 0.5 mg/dL (ref 0.3–1.2)
Total Protein: 6.3 g/dL — ABNORMAL LOW (ref 6.5–8.1)

## 2020-11-25 LAB — CBC WITH DIFFERENTIAL/PLATELET
Abs Immature Granulocytes: 0.05 10*3/uL (ref 0.00–0.07)
Basophils Absolute: 0.1 10*3/uL (ref 0.0–0.1)
Basophils Relative: 1 %
Eosinophils Absolute: 0.5 10*3/uL (ref 0.0–0.5)
Eosinophils Relative: 6 %
HCT: 41.4 % (ref 36.0–46.0)
Hemoglobin: 14 g/dL (ref 12.0–15.0)
Immature Granulocytes: 1 %
Lymphocytes Relative: 18 %
Lymphs Abs: 1.4 10*3/uL (ref 0.7–4.0)
MCH: 29.9 pg (ref 26.0–34.0)
MCHC: 33.8 g/dL (ref 30.0–36.0)
MCV: 88.5 fL (ref 80.0–100.0)
Monocytes Absolute: 0.5 10*3/uL (ref 0.1–1.0)
Monocytes Relative: 7 %
Neutro Abs: 4.9 10*3/uL (ref 1.7–7.7)
Neutrophils Relative %: 67 %
Platelets: 386 10*3/uL (ref 150–400)
RBC: 4.68 MIL/uL (ref 3.87–5.11)
RDW: 16.3 % — ABNORMAL HIGH (ref 11.5–15.5)
WBC: 7.4 10*3/uL (ref 4.0–10.5)
nRBC: 0 % (ref 0.0–0.2)

## 2020-11-25 MED ORDER — DEXAMETHASONE SODIUM PHOSPHATE 10 MG/ML IJ SOLN: 10.0000 mg | Freq: Once | INTRAMUSCULAR | Status: DC

## 2020-11-25 MED ORDER — HEPARIN SOD (PORK) LOCK FLUSH 100 UNIT/ML IV SOLN
INTRAVENOUS | Status: AC
  Filled 2020-11-25: qty 5

## 2020-11-25 MED ORDER — HEPARIN SOD (PORK) LOCK FLUSH 100 UNIT/ML IV SOLN
500.0000 [IU] | Freq: Once | INTRAVENOUS | Status: AC | PRN
  Administered 2020-11-25: 500 [IU]
  Filled 2020-11-25: qty 5

## 2020-11-25 MED ORDER — SODIUM CHLORIDE 0.9 % IV SOLN
12.5000 mg | Freq: Once | INTRAVENOUS | Status: AC
  Administered 2020-11-25: 12.5 mg via INTRAVENOUS
  Filled 2020-11-25: qty 0.5

## 2020-11-25 MED ORDER — DEXAMETHASONE SODIUM PHOSPHATE 10 MG/ML IJ SOLN
10.0000 mg | Freq: Once | INTRAMUSCULAR | Status: AC
  Administered 2020-11-25: 10 mg via INTRAVENOUS
  Filled 2020-11-25: qty 1

## 2020-11-25 MED ORDER — DIPHENOXYLATE-ATROPINE 2.5-0.025 MG PO TABS: 1.0000 | ORAL_TABLET | Freq: Four times a day (QID) | ORAL | 0 refills | Status: DC | PRN

## 2020-11-25 MED ORDER — PROCHLORPERAZINE MALEATE 10 MG PO TABS: 10.0000 mg | ORAL_TABLET | Freq: Four times a day (QID) | ORAL | 2 refills | Status: DC | PRN

## 2020-11-25 MED ORDER — SODIUM CHLORIDE 0.9% FLUSH
10.0000 mL | Freq: Once | INTRAVENOUS | Status: AC
  Administered 2020-11-25: 10 mL via INTRAVENOUS
  Filled 2020-11-25: qty 10

## 2020-11-25 MED ORDER — SODIUM CHLORIDE 0.9 % IV SOLN
Freq: Once | INTRAVENOUS | Status: AC
  Filled 2020-11-25: qty 250

## 2020-11-25 MED ORDER — MIRTAZAPINE 15 MG PO TABS: 15.0000 mg | ORAL_TABLET | Freq: Every day | ORAL | 0 refills | Status: DC

## 2020-11-25 NOTE — Progress Notes (Signed)
Patient here for oncology symptom mangment appointment, expresses concerns of N/V/D, dizziness and chest discomfort

## 2020-11-25 NOTE — Patient Instructions (Signed)
I am so sorry you are not feeling well.   Today while you are in clinic we gave you 1 L of normal saline along with 12.5 mg of Phenergan for nausea and 10 mg of dexamethasone for nausea and energy.  I sent in a prescription for Remeron 15 mg to take at bedtime to help with sleep and with your appetite.  I recommend stopping trazodone while you trial Remeron.  I also called a prescription in for Compazine 10 mg every 6 hours as needed for nausea/vomiting.  You can take this with Zofran or without.  I was able to send a referral to [facility].  It is a lottery system so they drawl names and will give you a call if you are chosen.  This is a free clinic.  I also gave them your email along with your telephone number.   I have not heard back from our social worker with regards to help with your dental co-pay.  Given your significant weight loss, I think getting a CT of your chest abdomen and pelvis is a good idea.  I am going to get that scheduled for you in the next week or so.  Keep your scheduled mammogram on 12/02/2020.  I will call you with results from your CT scan once we get that scheduled.  Faythe Casa, NP 11/25/2020 12:39 PM

## 2020-11-25 NOTE — Progress Notes (Signed)
Symptom Management Consult note [place]  Telephone:([phone]) [phone] Fax:([phone]) [phone]  Patient Care Team: Perrin Maltese, MD as PCP - General (Internal Medicine) End, Harrell Gave, MD as PCP - Cardiology (Cardiology) Rico Junker, RN as Registered Nurse Theodore Demark, RN as Oncology Nurse Navigator (Oncology) Lloyd Huger, MD as Consulting Physician (Oncology) Jeral Fruit, RN as Registered Nurse (Oncology) Bary Castilla [place] Gleason, MD as Consulting Physician (General Surgery)   Name of the patient: Kelsey Russo  [id number]  Oct 11, 1965   Date of visit: 11/25/2020   Diagnosis- Breast Cancer   Chief complaint/ Reason for visit- Dehydration, weakness, anorexia  Heme/Onc history: Kelsey Russo is a 55 year old female with past medical history significant for hypertension, palpitations right upper quadrant breast cancer.  She is followed by Dr. Grayland Ormond and was last evaluated on 11/09/2020.  She completed 4 cycles of AC and 10 cycles of weekly Taxol on 12/23/19 and due to decreased performance status and neuropathy treatment was discontinued altogether.  CT scan of chest abdomen and pelvis on 12/01/2019 did not reveal any evidence of metastatic disease.  Underwent simple mastectomy on 07/13/2020 with improvement of burden of disease.  She did not require XRT.  She was started on letrozole and most recently switched to tamoxifen due to osteoporosis.  She developed personality changes on tamoxifen and was started on Zoloft.  Symptoms appeared to improve with the addition of zoloft.  She was started on Fosamax for osteoporosis.  Plan is for her to complete tamoxifen in December 2026.  Interval history- Patient called yesterday  expressing concern over dizziness, weakness and problems eating. Symptoms have been present for months but have become progressively worse over the past several weeks.  She was recently treated for a tooth infection with clindamycin by  her PCP for 10 days which she has completed 2 to 3 days ago.  She is unable to eat or drink secondary to persistent nausea.  She feels dizzy and weak likely due to dehydration.  She is fairly significant weight loss.  She attempted to drink 2 boost daily but often times gets in one.  She still has some tooth pain but overall this has improved after her antibiotic.  Endorses constipation for which she is taking a laxative which is now turned into diarrhea.  She is having 5-6 episodes of diarrhea daily.  She has occasional nausea and states that Zofran is not helping.  She denies any shortness of breath, chest pain, new lumps or bumps.    ECOG FS:2 - Symptomatic, <50% confined to bed  Review of systems- Review of Systems  Constitutional: Positive for weight loss. Negative for chills, fever and malaise/fatigue.  HENT: Negative for congestion, ear pain and tinnitus.   Eyes: Negative.  Negative for blurred vision and double vision.  Respiratory: Negative.  Negative for cough, sputum production and shortness of breath.   Cardiovascular: Negative.  Negative for chest pain, palpitations and leg swelling.  Gastrointestinal: Positive for constipation, diarrhea and nausea. Negative for abdominal pain and vomiting.  Genitourinary: Negative for dysuria, frequency and urgency.  Musculoskeletal: Negative for back pain and falls.  Skin: Negative.  Negative for rash.  Neurological: Positive for dizziness and weakness. Negative for headaches.  Endo/Heme/Allergies: Negative.  Does not bruise/bleed easily.  Psychiatric/Behavioral: Positive for depression. The patient has insomnia. The patient is not nervous/anxious.      Current treatment- Tamoxifen   Allergies  Allergen Reactions  . Imitrex [Sumatriptan] Anaphylaxis  . Penicillins Rash  Did it involve swelling of the face/tongue/throat, SOB, or low BP? Unknown Did it involve sudden or severe rash/hives, skin peeling, or any reaction on the inside of your  mouth or nose? Unknown Did you need to seek medical attention at a hospital or doctor's office? Unknown When did it last happen?? If all above answers are "NO", may proceed with cephalosporin use.      Past Medical History:  Diagnosis Date  . Anemia    during chemotherapy  . Brain tumor (benign) ([place])   . Breast cancer ([place]) 1999   Status post right breast lumpectomy and radiation  . COPD (chronic obstructive pulmonary disease) ([place])   . Depression   . History of chemotherapy 1999  . History of seizure disorder   . Hypertension   . Personal history of radiation therapy 1999  . Pneumonia   . PONV (postoperative nausea and vomiting)    vomiting     Past Surgical History:  Procedure Laterality Date  . BRAIN SURGERY    . Brain tumor resection  1991  . BREAST BIOPSY Right 1999   Positive  . BREAST BIOPSY Right 11/10/2019   [place] bx 10:00 heart marker, path pending  . BREAST BIOPSY Right 11/10/2019   [place] bx axilla, hydromark 4, path pending  . BREAST BIOPSY Left 11/10/2019   [place] bx 2:00, venus marker, path pending  . PORTACATH PLACEMENT Left 11/30/2019   Procedure: INSERTION PORT-A-CATH;  Surgeon: Robert Bellow, MD;  Location: [facility] ORS;  Service: General;  Laterality: Left;  . Right breast lumpectomy    . SIMPLE MASTECTOMY WITH AXILLARY SENTINEL NODE BIOPSY Right 07/13/2020   Procedure: SIMPLE MASTECTOMY WITH AXILLARY SENTINEL NODE BIOPSY;  Surgeon: Robert Bellow, MD;  Location: [facility] ORS;  Service: General;  Laterality: Right;  . TONSILLECTOMY    . TUBAL LIGATION      Social History   Socioeconomic History  . Marital status: Married    Spouse name: Not on file  . Number of children: Not on file  . Years of education: Not on file  . Highest education level: Not on file  Occupational History  . Not on file  Tobacco Use  . Smoking status: Current Every Day Smoker    Packs/day: 0.25    Years: 40.00    Pack years: 10.00    Types: Cigarettes  . Smokeless  tobacco: Never Used  Vaping Use  . Vaping Use: Never used  Substance and Sexual Activity  . Alcohol use: No    Alcohol/week: 0.0 standard drinks  . Drug use: Yes    Frequency: 3.0 times per week    Types: Marijuana  . Sexual activity: Not on file  Other Topics Concern  . Not on file  Social History Narrative   Lives at home with husband.   Social Determinants of Health   Financial Resource Strain: Not on file  Food Insecurity: Not on file  Transportation Needs: Not on file  Physical Activity: Not on file  Stress: Not on file  Social Connections: Not on file  Intimate Partner Violence: Not on file    Family History  Problem Relation Age of Onset  . Depression Mother   . CAD Father   . Hypertension Father   . Lung cancer Father   . Coronary artery disease Father 40  . Breast cancer Maternal Grandmother      Current Outpatient Medications:  .  acetaminophen (TYLENOL) 500 MG tablet, Take 500-1,000 mg by mouth every 6 (  six) hours as needed for moderate pain. , Disp: , Rfl:  .  albuterol (VENTOLIN HFA) 108 (90 Base) MCG/ACT inhaler, Inhale 1-2 puffs into the lungs every 6 (six) hours as needed for wheezing or shortness of breath., Disp: , Rfl:  .  alendronate (FOSAMAX) 70 MG tablet, TAKE 1 TABLET (70 MG) BY MOUTH ONCE A WEEK. TAKE WITH A FULL GLASS OF WATER ONAN EMTPY STOMACH., Disp: 4 tablet, Rfl: 3 .  clonazePAM (KLONOPIN) 1 MG tablet, Take 1 mg by mouth 3 (three) times daily as needed for anxiety., Disp: , Rfl:  .  diphenoxylate-atropine (LOMOTIL) 2.5-0.025 MG tablet, Take 1 tablet by mouth 4 (four) times daily as needed for diarrhea or loose stools., Disp: 60 tablet, Rfl: 0 .  gabapentin (NEURONTIN) 100 MG capsule, Take 100 mg by mouth 2 (two) times daily., Disp: , Rfl:  .  losartan (COZAAR) 100 MG tablet, Take 100 mg by mouth daily., Disp: , Rfl:  .  mirtazapine (REMERON) 15 MG tablet, Take 1 tablet (15 mg total) by mouth at bedtime., Disp: 30 tablet, Rfl: 0 .  nicotine  (NICODERM CQ - DOSED IN MG/24 HOURS) 14 mg/24hr patch, Place 14 mg onto the skin daily., Disp: , Rfl:  .  ondansetron (ZOFRAN) 8 MG tablet, TAKE 1 TABLET BY MOUTH 2 TIMES DAILY AS NEEDED, Disp: 60 tablet, Rfl: 2 .  potassium chloride SA (KLOR-CON) 20 MEQ tablet, Take 1 tablet (20 mEq total) by mouth 2 (two) times daily., Disp: 30 tablet, Rfl: 1 .  prochlorperazine (COMPAZINE) 10 MG tablet, Take 1 tablet (10 mg total) by mouth every 6 (six) hours as needed for nausea or vomiting., Disp: 60 tablet, Rfl: 2 .  Pseudoeph-Doxylamine-DM-APAP (NYQUIL PO), Take 1-2 capsules by mouth at bedtime as needed (sleep.). , Disp: , Rfl:  .  sertraline (ZOLOFT) 25 MG tablet, TAKE 1 TABLET BY MOUTH ONCE A DAY, Disp: 30 tablet, Rfl: 1 .  tamoxifen (NOLVADEX) 20 MG tablet, Take 1 tablet (20 mg total) by mouth daily., Disp: 30 tablet, Rfl: 3 .  tetrahydrozoline 0.05 % ophthalmic solution, Place 1-2 drops into both eyes 3 (three) times daily as needed (dry/irritated eyes)., Disp: , Rfl:  .  traMADol (ULTRAM) 50 MG tablet, Take 1 tablet (50 mg total) by mouth 2 (two) times daily., Disp: 60 tablet, Rfl: 0 .  oxyCODONE (OXY IR/ROXICODONE) 5 MG immediate release tablet, Take 5 mg by mouth every 6 (six) hours as needed for severe pain. PRN BID (Patient not taking: Reported on 11/25/2020), Disp: , Rfl:  No current facility-administered medications for this visit.  Facility-Administered Medications Ordered in Other Visits:  .  0.9 %  sodium chloride infusion, , Intravenous, Once, Lloyd Huger, MD, Last Rate: 500 mL/hr at 04/06/20 1025, Rate Verify at 04/06/20 1025 .  sodium chloride flush (NS) 0.9 % injection 10 mL, 10 mL, Intravenous, PRN, Grayland Ormond, Kathlene November, MD .  sodium chloride flush (NS) 0.9 % injection 10 mL, 10 mL, Intravenous, PRN, Lloyd Huger, MD, 10 mL at 01/13/20 0851  Physical exam:  Vitals:   11/25/20 1012  BP: 113/80  Pulse: 82  Resp: 20  Temp: 98.7 F (37.1 C)  SpO2: 100%  Weight: 127 lb  12.8 oz (58 kg)   Physical Exam Constitutional:      Appearance: Normal appearance. She is ill-appearing.  HENT:     Head: Normocephalic and atraumatic.     Mouth/Throat:     Dentition: Abnormal dentition. Dental caries present.  Eyes:  Pupils: Pupils are equal, round, and reactive to light.  Cardiovascular:     Rate and Rhythm: Normal rate and regular rhythm.     Heart sounds: Normal heart sounds. No murmur heard.   Pulmonary:     Effort: Pulmonary effort is normal.     Breath sounds: Normal breath sounds. No wheezing.  Abdominal:     General: Bowel sounds are normal. There is no distension.     Palpations: Abdomen is soft.     Tenderness: There is no abdominal tenderness.  Musculoskeletal:        General: Normal range of motion.     Cervical back: Normal range of motion.  Skin:    General: Skin is warm and dry.     Findings: No rash.  Neurological:     Mental Status: She is alert and oriented to person, place, and time.  Psychiatric:        Judgment: Judgment normal.      CMP Latest Ref Rng & Units 11/25/2020  Glucose 70 - 99 mg/dL 86  BUN 6 - 20 mg/dL 8  Creatinine 0.44 - 1.00 mg/dL 0.59  Sodium 135 - 145 mmol/L 130(L)  Potassium 3.5 - 5.1 mmol/L 4.2  Chloride 98 - 111 mmol/L 103  CO2 22 - 32 mmol/L 20(L)  Calcium 8.9 - 10.3 mg/dL 8.4(L)  Total Protein 6.5 - 8.1 g/dL 6.3(L)  Total Bilirubin 0.3 - 1.2 mg/dL 0.5  Alkaline Phos 38 - 126 U/L 64  AST 15 - 41 U/L 20  ALT 0 - 44 U/L 18   CBC Latest Ref Rng & Units 11/25/2020  WBC 4.0 - 10.5 K/uL 7.4  Hemoglobin 12.0 - 15.0 g/dL 14.0  Hematocrit 36.0 - 46.0 % 41.4  Platelets 150 - 400 K/uL 386    No images are attached to the encounter.  No results found.   Assessment and plan- Patient is a 55 y.o. female who presents for nausea, dizziness, dehydration and weakness secondary to anorexia.  Symptoms have been present for several months but have worsened over the past 2 weeks.   Stage II ER/PR positive HER-2  new negative right breast: -She is status post 4 cycles of AC followed by 10 cycles of Taxol. -Taxol discontinued early secondary to decrease performance status and worsening peripheral neuropathy.  Last given on 05/07/2020. -Had simple mastectomy on 07/13/2020 with improvement of burden of disease. -She did not require XRT. -She was started on letrozole which was discontinued secondary to osteoporosis. -She was started on tamoxifen and should complete treatment in December 2026. -Given high risk disease, she most likely will benefit from extended hormonal treatment.  -She is scheduled for repeat mammogram on 12/02/2020.  History of DCIS: -Status post lumpectomy and XRT. -Denies adjuvant hormonal therapy.  Recurrent dental infections: -Secondary to poor dental hygiene and lack of care. -She has never been seen by a dentist and has several missing/broken teeth. -Most recently she has been treated with clindamycin for 10 days by PCP for dental infection. -She has been treated 4-5 times in the past year for recurrent infections. -Patient will be reaching out to [facility] for possible appointment but has limited funds. -Information sent to [facility] dental clinic who offers free dental care on Wednesday evenings.  It is a lottery, so patient would be notified if she was chosen.   Nausea w/out vomiting: -Unclear etiology. -Symptoms have been present for many months and have worsened over the past 1 to 2 weeks likely secondary to  treatment for dental infection with clindamycin. -Discontinue Zofran and can start Compazine 10 mg every 6 hours as needed. -Dental clinic as above. -We will give 12.5 mg IV Phenergan while in clinic today.  She will also receive 1 L NaCl for low sodium level (130) and 10 mg dexamethasone for nausea and energy.  Anorexia/weight loss: -Likely secondary to recurrent dental infections and possibly related to tamoxifen. -At this point, she needs to remain on tamoxifen given  high risk disease and she was recently off of tamoxifen when she initiated zoloft w/out improvement of anorexia.  -Can trial Remeron 15 mg at bedtime to help with appetite. -Recommend s him he stop trazodone. -She has been seen by our dietitian.  Recommend follow-up. -Current weight is 127 pounds (previously 141 pounds).  -Recommend imaging with CT CAP given + right axillary LN involvement.   Diarrhea: -Secondary to clindamycin at laxative use. -New prescription sent for Lomotil to take as needed. -Recommend she take this sparingly given history of constipation.  Disposition: -Recommend CT CAP in the next week or so.  -Keep mammogram as scheduled for 12/02/2020. -Referral sent to [facility].  -We will follow up with telephone call after CT scan scheduled and results.    Visit Diagnosis 1. Dizziness and giddiness   2. Nausea without vomiting   3. Weight loss     Patient expressed understanding and was in agreement with this plan. She also understands that She can call clinic at any time with any questions, concerns, or complaints.   Greater than 50% was spent in counseling and coordination of care with this patient including but not limited to discussion of the relevant topics above (See A&P) including, but not limited to diagnosis and management of acute and chronic medical conditions.   Thank you for allowing me to participate in the care of this very pleasant patient.    Jacquelin Hawking, NP [place] [facility] Cell - [phone] Pager- [phone] 11/25/2020 1:56 PM

## 2020-11-25 NOTE — Progress Notes (Signed)
Patient received IV Decaron and phenergan mediations in symptom management clinic today, she tolerated IV fluids infusion well. Patient discharged with appoinments. Stable.

## 2020-12-01 ENCOUNTER — Other Ambulatory Visit: Payer: Self-pay

## 2020-12-01 DIAGNOSIS — C50411 Malignant neoplasm of upper-outer quadrant of right female breast: Secondary | ICD-10-CM

## 2020-12-01 NOTE — Progress Notes (Addendum)
Daily Session Note  Patient Details  Name: Kelsey Russo MRN: [id number] Date of Birth: [birth year] Referring Provider:   April Manson Cancer Associated Rehabilitation & Exercise from 10/25/2020 in [facility] Cardiac and Pulmonary Rehab  Referring Provider Delight Hoh      Encounter Date: 12/01/2020  Check In:  Session Check In - 12/01/20 1226      Check-In   Supervising physician immediately available to respond to emergencies See telemetry face sheet for immediately available ER MD    Location [facility]-Cardiac & Pulmonary Rehab    Staff Present Birdie Sons, MPA, Elveria Rising, BA, ACSM CEP, Exercise Physiologist;Colleen Mel Almond, RN, BSN    Virtual Visit No    Medication changes reported     Yes    Comments on remeron to increase appetite    Fall or balance concerns reported    No    Tobacco Cessation No Change    Warm-up and Cool-down Performed on first and last piece of equipment    Resistance Training Performed Yes    VAD Patient? No    PAD/SET Patient? No      Pain Assessment   Currently in Pain? No/denies              Social History   Tobacco Use  Smoking Status Current Every Day Smoker  . Packs/day: 0.25  . Years: 40.00  . Pack years: 10.00  . Types: Cigarettes  Smokeless Tobacco Never Used    Goals Met:  Independence with exercise equipment Exercise tolerated well No report of cardiac concerns or symptoms Strength training completed today  Goals Unmet:  Not Applicable  Comments: Pt able to follow exercise prescription today without complaint.  Will continue to monitor for progression.    Dr. Emily Filbert is Medical Director for [place] and [facility].

## 2020-12-02 ENCOUNTER — Ambulatory Visit
Admission: RE | Admit: 2020-12-02 | Discharge: 2020-12-02 | Disposition: A | Discharge status: Home / Self Care | Payer: Medicaid Other | Source: Ambulatory Visit | Attending: General Surgery | Admitting: General Surgery

## 2020-12-02 ENCOUNTER — Other Ambulatory Visit: Payer: Self-pay

## 2020-12-02 DIAGNOSIS — Z1231 Encounter for screening mammogram for malignant neoplasm of breast: Secondary | ICD-10-CM | POA: Diagnosis not present

## 2020-12-02 HISTORY — DX: Personal history of antineoplastic chemotherapy: Z92.21

## 2020-12-05 ENCOUNTER — Ambulatory Visit
Admission: RE | Admit: 2020-12-05 | Discharge: 2020-12-05 | Disposition: A | Discharge status: Home / Self Care | Payer: Medicaid Other | Source: Ambulatory Visit | Attending: Oncology | Admitting: Oncology

## 2020-12-05 ENCOUNTER — Other Ambulatory Visit: Payer: Self-pay

## 2020-12-05 DIAGNOSIS — R634 Abnormal weight loss: Secondary | ICD-10-CM | POA: Insufficient documentation

## 2020-12-05 DIAGNOSIS — R42 Dizziness and giddiness: Secondary | ICD-10-CM | POA: Insufficient documentation

## 2020-12-05 DIAGNOSIS — R110 Nausea: Secondary | ICD-10-CM | POA: Diagnosis present

## 2020-12-05 MED ORDER — IOHEXOL 300 MG/ML  SOLN
100.0000 mL | Freq: Once | INTRAMUSCULAR | Status: AC | PRN
  Administered 2020-12-05: 100 mL via INTRAVENOUS

## 2020-12-06 ENCOUNTER — Telehealth: Payer: Self-pay | Admitting: Oncology

## 2020-12-06 NOTE — Telephone Encounter (Signed)
Re: weight loss  CT CAP from today 12/06/20 showed no acute findings or evidence of metastatic disease in the chest abdomen or pelvis.  Sigmoid colonic diverticulosis without findings of acute diverticulitis.  Patient called and notified of findings- Left VM  Faythe Casa, NP 12/06/2020 11:52 AM

## 2020-12-08 ENCOUNTER — Encounter: Payer: Medicaid Other | Attending: Internal Medicine

## 2020-12-08 DIAGNOSIS — C50411 Malignant neoplasm of upper-outer quadrant of right female breast: Secondary | ICD-10-CM | POA: Insufficient documentation

## 2020-12-12 ENCOUNTER — Telehealth: Payer: Self-pay

## 2020-12-12 ENCOUNTER — Other Ambulatory Visit: Payer: Self-pay

## 2020-12-12 ENCOUNTER — Inpatient Hospital Stay: Payer: Medicaid Other | Attending: Oncology

## 2020-12-12 NOTE — Telephone Encounter (Signed)
Nutrition  Called patient for scheduled phone follow-up but no answer.  Left message with call back number.  Festus Pursel B. Zenia Resides, [place], [place] Registered Dietitian [phone] (mobile)

## 2020-12-13 ENCOUNTER — Telehealth: Payer: Self-pay

## 2020-12-13 NOTE — Telephone Encounter (Signed)
Left message for patient regarding CARE program attendance.

## 2020-12-14 NOTE — Telephone Encounter (Signed)
Patient called back- states she had not been feeling good this past week and had family issues going on the week before. States she will be at [employer] for class.

## 2020-12-15 ENCOUNTER — Other Ambulatory Visit: Payer: Self-pay

## 2020-12-15 DIAGNOSIS — C50411 Malignant neoplasm of upper-outer quadrant of right female breast: Secondary | ICD-10-CM

## 2020-12-15 NOTE — Progress Notes (Signed)
Daily Session Note  Patient Details  Name: Kelsey Russo MRN: [id number] Date of Birth: Jun 28, 1966 Referring Provider:   April Manson Cancer Associated Rehabilitation & Exercise from 10/25/2020 in [facility] Cardiac and Pulmonary Rehab  Referring Provider Delight Hoh      Encounter Date: 12/15/2020  Check In:  Session Check In - 12/15/20 1211      Check-In   Supervising physician immediately available to respond to emergencies See telemetry face sheet for immediately available ER MD    Location [facility]-Cardiac & Pulmonary Rehab    Staff Present Birdie Sons, MPA, Elveria Rising, BA, ACSM CEP, Exercise Physiologist;Kara Eliezer Bottom, MS Exercise Physiologist    Virtual Visit No    Medication changes reported     No    Fall or balance concerns reported    No    Tobacco Cessation No Change    Current number of cigarettes/nicotine per day     20    Warm-up and Cool-down Performed on first and last piece of equipment    Resistance Training Performed Yes    VAD Patient? No    PAD/SET Patient? No      Pain Assessment   Currently in Pain? No/denies              Social History   Tobacco Use  Smoking Status Current Every Day Smoker  . Packs/day: 0.25  . Years: 40.00  . Pack years: 10.00  . Types: Cigarettes  Smokeless Tobacco Never Used    Goals Met:  Independence with exercise equipment Exercise tolerated well No report of cardiac concerns or symptoms Strength training completed today  Goals Unmet:  Not Applicable  Comments: Pt able to follow exercise prescription today without complaint.  Will continue to monitor for progression.    Dr. Emily Filbert is Medical Director for [place] and [facility].

## 2020-12-16 ENCOUNTER — Other Ambulatory Visit: Payer: Self-pay | Admitting: Oncology

## 2020-12-20 ENCOUNTER — Other Ambulatory Visit: Payer: Self-pay

## 2020-12-20 DIAGNOSIS — C50411 Malignant neoplasm of upper-outer quadrant of right female breast: Secondary | ICD-10-CM

## 2020-12-20 NOTE — Progress Notes (Signed)
Daily Session Note  Patient Details  Name: Kelsey Russo MRN: [id number] Date of Birth: 10-23-1965 Referring Provider:   April Manson Cancer Associated Rehabilitation & Exercise from 10/25/2020 in [facility] Cardiac and Pulmonary Rehab  Referring Provider Delight Hoh      Encounter Date: 12/20/2020  Check In:  Session Check In - 12/20/20 1347      Check-In   Supervising physician immediately available to respond to emergencies See telemetry face sheet for immediately available ER MD    Location [facility]-Cardiac & Pulmonary Rehab    Staff Present Hope Budds RDN, Tawanna Solo, MS Exercise Physiologist    Virtual Visit No    Medication changes reported     No    Fall or balance concerns reported    No    Tobacco Cessation No Change    Warm-up and Cool-down Performed on first and last piece of equipment    Resistance Training Performed Yes    VAD Patient? No    PAD/SET Patient? No              Social History   Tobacco Use  Smoking Status Current Every Day Smoker  . Packs/day: 0.25  . Years: 40.00  . Pack years: 10.00  . Types: Cigarettes  Smokeless Tobacco Never Used    Goals Met:  Independence with exercise equipment Exercise tolerated well No report of cardiac concerns or symptoms Strength training completed today  Goals Unmet:  Not Applicable  Comments: Pt able to follow exercise prescription today without complaint.  Will continue to monitor for progression.   Dr. Emily Filbert is Medical Director for [place] and [facility].

## 2020-12-22 ENCOUNTER — Other Ambulatory Visit: Payer: Self-pay

## 2020-12-22 DIAGNOSIS — C50411 Malignant neoplasm of upper-outer quadrant of right female breast: Secondary | ICD-10-CM

## 2020-12-22 NOTE — Progress Notes (Signed)
Daily Session Note  Patient Details  Name: Kelsey Russo MRN: [id number] Date of Birth: [birth year] Referring Provider:   April Manson Cancer Associated Rehabilitation & Exercise from 10/25/2020 in [facility] Cardiac and Pulmonary Rehab  Referring Provider Delight Hoh      Encounter Date: 12/22/2020  Check In:  Session Check In - 12/22/20 1234      Check-In   Supervising physician immediately available to respond to emergencies See telemetry face sheet for immediately available ER MD    Location [facility]-Cardiac & Pulmonary Rehab    Staff Present Birdie Sons, MPA, Elveria Rising, BA, ACSM CEP, Exercise Physiologist;Kara Eliezer Bottom, MS Exercise Physiologist    Virtual Visit No    Medication changes reported     No    Fall or balance concerns reported    No    Tobacco Cessation No Change    Current number of cigarettes/nicotine per day     20    Warm-up and Cool-down Performed on first and last piece of equipment    Resistance Training Performed Yes    VAD Patient? No    PAD/SET Patient? No      Pain Assessment   Currently in Pain? No/denies              Social History   Tobacco Use  Smoking Status Current Every Day Smoker  . Packs/day: 0.25  . Years: 40.00  . Pack years: 10.00  . Types: Cigarettes  Smokeless Tobacco Never Used    Goals Met:  Independence with exercise equipment Exercise tolerated well No report of cardiac concerns or symptoms Strength training completed today  Goals Unmet:  Not Applicable  Comments: Pt able to follow exercise prescription today without complaint.  Will continue to monitor for progression.    Dr. Emily Filbert is Medical Director for [place] and [facility].

## 2020-12-27 ENCOUNTER — Other Ambulatory Visit: Payer: Self-pay

## 2020-12-27 DIAGNOSIS — C50411 Malignant neoplasm of upper-outer quadrant of right female breast: Secondary | ICD-10-CM

## 2020-12-27 NOTE — Progress Notes (Signed)
Daily Session Note  Patient Details  Name: Kelsey Russo MRN: [id number] Date of Birth: 24-Jan-1966 Referring Provider:   April Manson Cancer Associated Rehabilitation & Exercise from 10/25/2020 in [facility] Cardiac and Pulmonary Rehab  Referring Provider Delight Hoh      Encounter Date: 12/27/2020  Check In:  Session Check In - 12/27/20 1236      Check-In   Supervising physician immediately available to respond to emergencies See telemetry face sheet for immediately available ER MD    Location [facility]-Cardiac & Pulmonary Rehab    Staff Present Birdie Sons, MPA, RN;Jessica Spencer, MA, RCEP, CCRP, Marylynn Pearson, MS Exercise Physiologist    Virtual Visit No    Medication changes reported     No    Fall or balance concerns reported    No    Tobacco Cessation No Change    Current number of cigarettes/nicotine per day     20    Warm-up and Cool-down Performed on first and last piece of equipment    Resistance Training Performed Yes    VAD Patient? No    PAD/SET Patient? No      Pain Assessment   Currently in Pain? No/denies              Social History   Tobacco Use  Smoking Status Current Every Day Smoker  . Packs/day: 0.25  . Years: 40.00  . Pack years: 10.00  . Types: Cigarettes  Smokeless Tobacco Never Used    Goals Met:  Independence with exercise equipment Exercise tolerated well No report of cardiac concerns or symptoms Strength training completed today  Goals Unmet:  Not Applicable  Comments: Pt able to follow exercise prescription today without complaint.  Will continue to monitor for progression.    Dr. Emily Filbert is Medical Director for [place] [facility] Pulmonary [facility].

## 2021-01-03 ENCOUNTER — Encounter: Payer: Medicaid Other | Attending: Internal Medicine

## 2021-01-03 DIAGNOSIS — C50411 Malignant neoplasm of upper-outer quadrant of right female breast: Secondary | ICD-10-CM | POA: Insufficient documentation

## 2021-01-05 ENCOUNTER — Telehealth: Payer: Self-pay

## 2021-01-05 NOTE — Telephone Encounter (Signed)
Patient called to call out of CARE- thinks she may have bronchitis which her doctor is aware of and has started antibiotics. She also claimed she has an abscess on her tooth and she was encouraged to call her doctor to assess so she does not have any further problems that may arise. Patient understood and will keep [place] updated.

## 2021-01-10 ENCOUNTER — Other Ambulatory Visit: Payer: Self-pay

## 2021-01-12 ENCOUNTER — Telehealth: Payer: Self-pay

## 2021-01-12 NOTE — Telephone Encounter (Signed)
Nutrition Follow-up:  Patient with right breast cancer.  Patient has completed chemotherapy and radiation.  Simple mastectomy on 07/13/20.  Recent port removal (01/10/21).  Patient had returned RD's call and left message.   RD called patient back today.  Patient is sick with upper respiratory infection, not COVID per patient.  Reports that she is trying to eat and drink 1-4 boost shakes per day.  She wants to go back to work.  She has been attending the CARE program but had to stop due to bronchitis and port removal. Planning to return on 5/17 to the CARE program.  Reports that appetite is better on some days vs others but still trying to eat and get stronger to get back to work.      Medications: reviewed  Labs: reviewed  Anthropometrics:   Weight 134 lb on 5/10 (Dr Dwyane Luo office).    127 lb on 3/25 2/22 130 lb    NUTRITION DIAGNOSIS: Inadequate oral intake improved   INTERVENTION:  Encouraged patient to continue on high calorie, high protein foods combined with exercise to increase weight and strength to help her meet her goal to get back to work.  RD available as needed   NEXT VISIT: RD available as needed  Ronn Smolinsky B. Zenia Resides, [place], [place] Registered Dietitian [phone] (mobile)

## 2021-01-31 ENCOUNTER — Encounter: Payer: Medicaid Other | Admitting: *Deleted

## 2021-01-31 ENCOUNTER — Other Ambulatory Visit: Payer: Self-pay

## 2021-01-31 DIAGNOSIS — C50411 Malignant neoplasm of upper-outer quadrant of right female breast: Secondary | ICD-10-CM

## 2021-01-31 NOTE — Progress Notes (Signed)
Daily Session Note  Patient Details  Name: EMELYN ROEN MRN: [id number] Date of Birth: Jan 10, 1966 Referring Provider:   April Manson Cancer Associated Rehabilitation & Exercise from 10/25/2020 in [facility] Cardiac and Pulmonary Rehab  Referring Provider Delight Hoh      Encounter Date: 01/31/2021  Check In:  Session Check In - 01/31/21 1228      Check-In   Supervising physician immediately available to respond to emergencies See telemetry face sheet for immediately available ER MD    Location [facility]-Cardiac & Pulmonary Rehab    Staff Present Alberteen Sam, MA, RCEP, CCRP, Marylynn Pearson, MS, ASCM CEP, Exercise Physiologist    Virtual Visit No    Medication changes reported     No    Fall or balance concerns reported    No    Warm-up and Cool-down Performed on first and last piece of equipment    Resistance Training Performed Yes    VAD Patient? No    PAD/SET Patient? No      Pain Assessment   Currently in Pain? No/denies              Social History   Tobacco Use  Smoking Status Current Every Day Smoker  . Packs/day: 0.25  . Years: 40.00  . Pack years: 10.00  . Types: Cigarettes  Smokeless Tobacco Never Used    Goals Met:  Proper associated with RPD/PD & O2 Sat Independence with exercise equipment Exercise tolerated well No report of cardiac concerns or symptoms Strength training completed today  Goals Unmet:  Not Applicable  Comments: Pt able to follow exercise prescription today without complaint.  Will continue to monitor for progression.    Dr. Emily Filbert is Medical Director for [place].  Dr. Ottie Glazier is Medical Director for [facility] Pulmonary Rehabilitation.

## 2021-02-02 ENCOUNTER — Other Ambulatory Visit: Payer: Self-pay

## 2021-02-02 ENCOUNTER — Encounter: Payer: Medicaid Other | Attending: Internal Medicine

## 2021-02-02 DIAGNOSIS — C50411 Malignant neoplasm of upper-outer quadrant of right female breast: Secondary | ICD-10-CM | POA: Insufficient documentation

## 2021-02-02 NOTE — Progress Notes (Signed)
CARE Daily Session Note  Patient Details  Name: Kelsey Russo MRN: [id number] Date of Birth: [birth year] Referring Provider:   April Manson Cancer Associated Rehabilitation & Exercise from 10/25/2020 in [facility] Cardiac and Pulmonary Rehab  Referring Provider Delight Hoh      Encounter Date: 02/02/2021  Check In:  Session Check In - 02/02/21 1344      Check-In   Supervising physician immediately available to respond to emergencies See telemetry face sheet for immediately available ER MD    Location [facility]-Cardiac & Pulmonary Rehab    Staff Present Coralie Keens, MS, ASCM CEP, Exercise Physiologist;Amanda Oletta Darter, [place], ACSM CEP, Exercise Physiologist    Virtual Visit No    Medication changes reported     No    Fall or balance concerns reported    No    Tobacco Cessation No Change    Current number of cigarettes/nicotine per day     20    Warm-up and Cool-down Performed on first and last piece of equipment    Resistance Training Performed Yes    VAD Patient? No              Social History   Tobacco Use  Smoking Status Current Every Day Smoker  . Packs/day: 0.25  . Years: 40.00  . Pack years: 10.00  . Types: Cigarettes  Smokeless Tobacco Never Used    Goals Met:  Independence with exercise equipment Exercise tolerated well No report of cardiac concerns or symptoms Strength training completed today  Goals Unmet:  Not Applicable  Comments: Pt able to follow exercise prescription today without complaint.  Will continue to monitor for progression.   Dr. Emily Filbert is Medical Director for [place].  Dr. Ottie Glazier is Medical Director for [facility] Pulmonary Rehabilitation.

## 2021-02-03 ENCOUNTER — Other Ambulatory Visit: Payer: Self-pay

## 2021-02-03 DIAGNOSIS — C50411 Malignant neoplasm of upper-outer quadrant of right female breast: Secondary | ICD-10-CM

## 2021-02-03 NOTE — Progress Notes (Signed)
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  INTERVAL HISTORY: Patient returns to clinic today for routine 48-monthevaluation.  She is tolerating tamoxifen, Zoloft, and Fosamax well without significant side effects.  She no longer is having "personality changes".  Her weight is improving and she has gained 3 additional pounds.  She has a mild peripheral neuropathy that does not affect her day-to-day activity.  She has no other neurologic complaints.  She does not complain of weakness or fatigue today.  She has a good appetite.  She denies any recent fevers or illnesses. She has no chest pain, shortness of breath, cough, or hemoptysis.  She denies any nausea, vomiting, constipation, or diarrhea.  She has no urinary complaints.  Patient offers no further specific complaints today.  REVIEW OF SYSTEMS:   Review of Systems  Constitutional: Negative.  Negative for fever, malaise/fatigue and weight loss.  HENT:  Negative for sore throat.   Respiratory: Negative.  Negative for cough, hemoptysis and shortness of breath.   Cardiovascular: Negative.  Negative for chest pain, palpitations and leg swelling.  Gastrointestinal:  Negative for abdominal pain, diarrhea, nausea and vomiting.  Genitourinary: Negative.  Negative for dysuria.  Musculoskeletal: Negative.  Negative for  back pain and myalgias.  Skin: Negative.  Negative for rash.  Neurological:  Positive for sensory change. Negative for dizziness, speech change, focal weakness, weakness and headaches.  Psychiatric/Behavioral: Negative.  Negative for depression. The patient is not nervous/anxious and does not have insomnia.    As per HPI. Otherwise, a complete review of systems is negative.  PAST MEDICAL HISTORY: Past Medical History:  Diagnosis Date   Anemia    during chemotherapy   Brain tumor (benign) ([facility]    Breast cancer ([facility] 1999   Status post right breast lumpectomy and radiation   Breast cancer ([place] 11/2019   right breast   COPD (chronic obstructive pulmonary disease) (HCC)    Depression    History of chemotherapy 1999   History of seizure disorder    Hypertension    Personal history of chemotherapy    breast cancer 2021   Personal history of radiation therapy 1999   Pneumonia    PONV (postoperative nausea and vomiting)    vomiting    PAST SURGICAL HISTORY: Past Surgical History:  Procedure Laterality Date   BRAIN SURGERY     Brain tumor resection  1991   BREAST BIOPSY Right 1999   Positive   BREAST BIOPSY Right 11/10/2019   uKoreabx 10:00 heart marker, positive   BREAST BIOPSY Right 11/10/2019   uKoreabx axilla, hydromark 4, positive   BREAST BIOPSY Left 11/10/2019   uKoreabx 2:00, venus marker, neg   MASTECTOMY Right 07/13/2020   positive   PORTACATH PLACEMENT Left 11/30/2019   Procedure: INSERTION PORT-A-CATH;  Surgeon: BRobert Bellow MD;  Location: [facility] ORS;  Service: General;  Laterality: Left;   Right breast lumpectomy  SIMPLE MASTECTOMY WITH AXILLARY SENTINEL NODE BIOPSY Right 07/13/2020   Procedure: SIMPLE MASTECTOMY WITH AXILLARY SENTINEL NODE BIOPSY;  Surgeon: Robert Bellow, MD;  Location: [facility] ORS;  Service: General;  Laterality: Right;   TONSILLECTOMY     TUBAL LIGATION      FAMILY HISTORY: Family History  Problem Relation Age of Onset   Depression  Mother    CAD Father    Hypertension Father    Lung cancer Father    Coronary artery disease Father 59   Breast cancer Maternal Grandmother     ADVANCED DIRECTIVES (Y/N):  N  HEALTH MAINTENANCE: Social History   Tobacco Use   Smoking status: Every Day    Packs/day: 0.25    Years: 40.00    Pack years: 10.00    Types: Cigarettes   Smokeless tobacco: Never  Vaping Use   Vaping Use: Never used  Substance Use Topics   Alcohol use: No    Alcohol/week: 0.0 standard drinks   Drug use: Yes    Frequency: 3.0 times per week    Types: Marijuana     Colonoscopy:  PAP:  Bone density:  Lipid panel:  Allergies  Allergen Reactions   Imitrex [Sumatriptan] Anaphylaxis   Penicillins Rash    Did it involve swelling of the face/tongue/throat, SOB, or low BP? Unknown Did it involve sudden or severe rash/hives, skin peeling, or any reaction on the inside of your mouth or nose? Unknown Did you need to seek medical attention at a hospital or doctor's office? Unknown When did it last happen? ? If all above answers are "NO", may proceed with cephalosporin use.     Current Outpatient Medications  Medication Sig Dispense Refill   acetaminophen (TYLENOL) 500 MG tablet Take 500-1,000 mg by mouth every 6 (six) hours as needed for moderate pain.      albuterol (VENTOLIN HFA) 108 (90 Base) MCG/ACT inhaler Inhale 1-2 puffs into the lungs every 6 (six) hours as needed for wheezing or shortness of breath.     alendronate (FOSAMAX) 70 MG tablet TAKE 1 TABLET (70 MG) BY MOUTH ONCE A WEEK. TAKE WITH A FULL GLASS OF WATER ONAN EMTPY STOMACH. 4 tablet 3   clonazePAM (KLONOPIN) 1 MG tablet Take 1 mg by mouth 3 (three) times daily as needed for anxiety.     losartan (COZAAR) 100 MG tablet Take 100 mg by mouth daily.     ondansetron (ZOFRAN) 8 MG tablet TAKE 1 TABLET BY MOUTH 2 TIMES DAILY AS NEEDED 60 tablet 2   potassium chloride SA (KLOR-CON) 20 MEQ tablet Take 1 tablet (20 mEq total) by mouth 2 (two)  times daily. 30 tablet 1   prochlorperazine (COMPAZINE) 10 MG tablet Take 1 tablet (10 mg total) by mouth every 6 (six) hours as needed for nausea or vomiting. 60 tablet 2   Pseudoeph-Doxylamine-DM-APAP (NYQUIL PO) Take 1-2 capsules by mouth at bedtime as needed (sleep.).      sertraline (ZOLOFT) 25 MG tablet TAKE 1 TABLET BY MOUTH ONCE A DAY 30 tablet 1   tamoxifen (NOLVADEX) 20 MG tablet TAKE 1 TABLET BY MOUTH ONCE A DAY 30 tablet 3   tetrahydrozoline 0.05 % ophthalmic solution Place 1-2 drops into both eyes 3 (three) times daily as needed (dry/irritated eyes).     traMADol (ULTRAM) 50 MG tablet Take 1 tablet (50 mg total) by mouth 2 (two) times daily. 60 tablet 0   diphenoxylate-atropine (LOMOTIL) 2.5-0.025 MG tablet Take 1 tablet by mouth 4 (four)  times daily as needed for diarrhea or loose stools. (Patient not taking: Reported on 02/09/2021) 60 tablet 0   mirtazapine (REMERON) 15 MG tablet Take 1 tablet (15 mg total) by mouth at bedtime. 30 tablet 0   nicotine (NICODERM CQ - DOSED IN MG/24 HOURS) 14 mg/24hr patch Place 14 mg onto the skin daily. (Patient not taking: Reported on 02/09/2021)     oxyCODONE (OXY IR/ROXICODONE) 5 MG immediate release tablet Take 5 mg by mouth every 6 (six) hours as needed for severe pain. PRN BID (Patient not taking: No sig reported)     No current facility-administered medications for this visit.   Facility-Administered Medications Ordered in Other Visits  Medication Dose Route Frequency Provider Last Rate Last Admin   0.9 %  sodium chloride infusion   Intravenous Once Lloyd Huger, MD 500 mL/hr at 04/06/20 1025 Rate Verify at 04/06/20 1025   sodium chloride flush (NS) 0.9 % injection 10 mL  10 mL Intravenous PRN Lloyd Huger, MD       sodium chloride flush (NS) 0.9 % injection 10 mL  10 mL Intravenous PRN Lloyd Huger, MD   10 mL at 01/13/20 0851    OBJECTIVE: Vitals:   02/09/21 1402  BP: 112/77  Pulse: 76  Resp: 20  Temp: (!) 96.9 F  (36.1 C)  SpO2: 99%     Body mass index is 21 kg/m.    ECOG FS:0 - Asymptomatic   General: Well-developed, well-nourished, no acute distress. Eyes: Pink conjunctiva, anicteric sclera. HEENT: Normocephalic, moist mucous membranes. Breast: Exam deferred today. Lungs: No audible wheezing or coughing. Heart: Regular rate and rhythm. Abdomen: Soft, nontender, no obvious distention. Musculoskeletal: No edema, cyanosis, or clubbing. Neuro: Alert, answering all questions appropriately. Cranial nerves grossly intact. Skin: No rashes or petechiae noted. Psych: Normal affect.  LAB RESULTS:  Lab Results  Component Value Date   NA 130 (L) 11/25/2020   K 4.2 11/25/2020   CL 103 11/25/2020   CO2 20 (L) 11/25/2020   GLUCOSE 86 11/25/2020   BUN 8 11/25/2020   CREATININE 0.59 11/25/2020   CALCIUM 8.4 (L) 11/25/2020   PROT 6.3 (L) 11/25/2020   ALBUMIN 3.6 11/25/2020   AST 20 11/25/2020   ALT 18 11/25/2020   ALKPHOS 64 11/25/2020   BILITOT 0.5 11/25/2020   GFRNONAA >60 11/25/2020   GFRAA >60 04/20/2020    Lab Results  Component Value Date   WBC 7.4 11/25/2020   NEUTROABS 4.9 11/25/2020   HGB 14.0 11/25/2020   HCT 41.4 11/25/2020   MCV 88.5 11/25/2020   PLT 386 11/25/2020     STUDIES: No results found.  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   PLAN:    1. 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 She completed 4 cycles of Adriamycin and Cytoxan.  She only received 10 infusions of weekly Taxol her last one occurring on April 06, 2020.  Given her persistently decreased performance status and worsening neuropathy, treatment was discontinued. CT scan of the chest, abdomen, and pelvis on December 01, 2019 reviewed independently with no obvious evidence of metastatic  disease.  She underwent simple mastectomy on July 13, 2020 with significant improvement of her disease burden.  She did not require XRT given her simple mastectomy.  Letrozole was discontinued secondary to osteoporosis  and patient was initiated on tamoxifen which she will require to take for a minimum of 5 years completing treatment in December 2026. Given her high risk disease, could possibly extend treatment for 7 to 10 years.  Return to clinic in 6 months for routine evaluation. 2.  History of DCIS: Status post lumpectomy and XRT.  Patient reports she did not take any adjuvant hormonal therapy.  Given this history, previously referred patient to genetic counseling for evaluation and testing. 3.  Back pain: Chronic and unchanged.  Patient was instructed to use ibuprofen sparingly.  Continue tramadol as needed.   4.  Myalgias/restless leg: Patient does not complain of this today. 5.  Palpitations/syncope: Appreciate cardiology input.  Continue follow-up with cardiology as scheduled.   6.  Depression: Improved.  Continue Zoloft 25 mg daily. 7.  Osteoporosis: Continue Fosamax as prescribed.  Repeat bone mineral density in December 2022.   Patient expressed understanding and was in agreement with this plan. She also understands that She can call clinic at any time with any questions, concerns, or complaints.   Cancer Staging Primary cancer of upper outer quadrant of right female breast [facility]) Staging form: Breast, AJCC 8th Edition - Clinical stage from 11/26/2019: Stage IIIA (cT2, cN2a, cM0, G3, ER+, PR+, HER2-) - Signed by Lloyd Huger, MD on 11/26/2019 Stage prefix: Initial diagnosis Histologic grading system: 3 grade system   Lloyd Huger, MD   02/10/2021 10:01 PM

## 2021-02-09 ENCOUNTER — Other Ambulatory Visit: Payer: Self-pay

## 2021-02-09 ENCOUNTER — Inpatient Hospital Stay: Payer: Medicaid Other

## 2021-02-09 ENCOUNTER — Inpatient Hospital Stay: Payer: Medicaid Other | Attending: Oncology | Admitting: Oncology

## 2021-02-09 VITALS — BP 112/77 | HR 76 | Temp 96.9°F | Resp 20 | Wt 130.1 lb

## 2021-02-09 DIAGNOSIS — M791 Myalgia, unspecified site: Secondary | ICD-10-CM | POA: Diagnosis not present

## 2021-02-09 DIAGNOSIS — M810 Age-related osteoporosis without current pathological fracture: Secondary | ICD-10-CM | POA: Insufficient documentation

## 2021-02-09 DIAGNOSIS — R55 Syncope and collapse: Secondary | ICD-10-CM | POA: Diagnosis not present

## 2021-02-09 DIAGNOSIS — F32A Depression, unspecified: Secondary | ICD-10-CM | POA: Diagnosis not present

## 2021-02-09 DIAGNOSIS — M549 Dorsalgia, unspecified: Secondary | ICD-10-CM | POA: Insufficient documentation

## 2021-02-09 DIAGNOSIS — R002 Palpitations: Secondary | ICD-10-CM | POA: Diagnosis not present

## 2021-02-09 DIAGNOSIS — Z170 Estrogen receptor positive status [ER+]: Secondary | ICD-10-CM | POA: Diagnosis not present

## 2021-02-09 DIAGNOSIS — C50411 Malignant neoplasm of upper-outer quadrant of right female breast: Secondary | ICD-10-CM

## 2021-02-09 DIAGNOSIS — G8929 Other chronic pain: Secondary | ICD-10-CM | POA: Diagnosis not present

## 2021-02-09 DIAGNOSIS — Z79899 Other long term (current) drug therapy: Secondary | ICD-10-CM | POA: Insufficient documentation

## 2021-02-09 DIAGNOSIS — G2581 Restless legs syndrome: Secondary | ICD-10-CM | POA: Insufficient documentation

## 2021-02-09 NOTE — Progress Notes (Signed)
Daily Session Note  Patient Details  Name: Kelsey Russo MRN: [id number] Date of Birth: 1966-03-15 Referring Provider:   April Manson Cancer Associated Rehabilitation & Exercise from 10/25/2020 in [facility] Cardiac and Pulmonary Rehab  Referring Provider Delight Hoh       Encounter Date: 02/09/2021  Check In:  Session Check In - 02/09/21 1308       Check-In   Supervising physician immediately available to respond to emergencies See telemetry face sheet for immediately available ER MD    Location [facility]-Cardiac & Pulmonary Rehab    Staff Present Birdie Sons, MPA, RN;Melissa Caiola RDN, Rowe Pavy, BA, ACSM CEP, Exercise Physiologist    Virtual Visit No    Medication changes reported     No    Fall or balance concerns reported    No    Tobacco Cessation No Change    Warm-up and Cool-down Performed on first and last piece of equipment    Resistance Training Performed Yes    VAD Patient? No    PAD/SET Patient? No      Pain Assessment   Currently in Pain? No/denies                Social History   Tobacco Use  Smoking Status Every Day   Packs/day: 0.25   Years: 40.00   Pack years: 10.00   Types: Cigarettes  Smokeless Tobacco Never    Goals Met:  Independence with exercise equipment Exercise tolerated well No report of cardiac concerns or symptoms Strength training completed today  Goals Unmet:  Not Applicable  Comments: Pt able to follow exercise prescription today without complaint.  Will continue to monitor for progression.    Dr. Emily Filbert is Medical Director for [place].  Dr. Ottie Glazier is Medical Director for [facility] Pulmonary Rehabilitation.

## 2021-02-10 ENCOUNTER — Encounter: Payer: Self-pay | Admitting: Oncology

## 2021-02-10 DIAGNOSIS — C50411 Malignant neoplasm of upper-outer quadrant of right female breast: Secondary | ICD-10-CM

## 2021-02-10 NOTE — Research (Addendum)
Late entry for 02/09/2021:  SWOG S1714 Week 52 Assessment:  Patient in to clinic for her scheduled week 52 assessment unaccompanied this am. Labs were drawn at approximately 1:53 pm per protocol requirements for optional study labs by peripheral stick without difficulty, 2 red top and 2 green top tubes. The patient was assisted to the waiting area for her appointment with Dr. Grayland Ormond today and given her protocol questionnaires to complete prior to her visit by Mauricio Po, [place].  The only complaint the patient has is pain related to her right shoulder and arm for which she is receiving physical therapy for on an outpatient basis, and no appetite. She states she has lost about 30 lbs since prior to starting her chemotherapy and now she just never wants to eat. The patient states she is sleepy from her therapy today. The neuropathy assessment was completed using her right arm and right leg ( dominant side ). The patient was able to detect sensation and sharpness with the neuropen monofilament and neurotip to almost all sites of her right foot. The tuning fork was completed to her right arm and right leg without any difficulty. The patient denies any falls in the past 6 months. The timed get up and go assessment was completed by the patient without any difficulty.  Dr. Grayland Ormond in to see patient and assess solicited AE's for which the patient has complaints of mild numbness and tingling to her feet only today.   Dr. Grayland Ormond completed the neuropathy events form and the follow up physician assessment with a toxicity burden of 1. This research nurse reminded the patient she will see her again for her next assessment at 104 weeks. The patient was encouraged to call if she has any questions or concerns before her next visit. The patient voiced understanding of all procedures explained and is in agreement with them. Research RN spent approximately one hour with the patient to complete all of her protocol related  activities.    Kelsey Russo [id number] 02/10/2021  Adverse Event Log  Study/Protocol: K8003 Cycle: Week 52  Event Grade Onset Date Resolved Date Drug Name Attribution Treatment Comments   Dysesthesia 0         Neuralgia 0         Paresthesia 0         Peripheral Motor Neuropathy 0         Peripheral Sensory Neuropathy  Grade 1  03/02/2020  07/20/2020   Probable  None    Fatigue Grade 1 03/02/2020   Possible None   Pain Grade 1 07/18/2020   Not related Hydrocodone Pain related to lumpectomy last week    Jeral Fruit, RN, BSN, OCN 02/10/21 1100 am

## 2021-02-16 ENCOUNTER — Other Ambulatory Visit: Payer: Self-pay

## 2021-02-16 DIAGNOSIS — C50411 Malignant neoplasm of upper-outer quadrant of right female breast: Secondary | ICD-10-CM

## 2021-02-16 NOTE — Progress Notes (Signed)
Daily Session Note  Patient Details  Name: Kelsey Russo MRN: [id number] Date of Birth: [birth year] Referring Provider:   April Manson Cancer Associated Rehabilitation & Exercise from 10/25/2020 in [facility] Cardiac and Pulmonary Rehab  Referring Provider Delight Hoh       Encounter Date: 02/16/2021  Check In:  Session Check In - 02/16/21 1328       Check-In   Supervising physician immediately available to respond to emergencies See telemetry face sheet for immediately available ER MD    Location [facility]-Cardiac & Pulmonary Rehab    Staff Present Coralie Keens, MS, ASCM CEP, Exercise Physiologist;Amanda Oletta Darter, [place], ACSM CEP, Exercise Physiologist    Virtual Visit No    Medication changes reported     No    Fall or balance concerns reported    No    Tobacco Cessation No Change    Warm-up and Cool-down Performed on first and last piece of equipment    Resistance Training Performed Yes    VAD Patient? No    PAD/SET Patient? No      Pain Assessment   Currently in Pain? No/denies                Social History   Tobacco Use  Smoking Status Every Day   Packs/day: 0.25   Years: 40.00   Pack years: 10.00   Types: Cigarettes  Smokeless Tobacco Never    Goals Met:  Independence with exercise equipment Exercise tolerated well No report of cardiac concerns or symptoms Strength training completed today  Goals Unmet:  Not Applicable  Comments: Pt able to follow exercise prescription today without complaint.  Will continue to monitor for progression.    Dr. Emily Filbert is Medical Director for [place].  Dr. Ottie Glazier is Medical Director for [facility] Pulmonary Rehabilitation.

## 2021-02-23 ENCOUNTER — Other Ambulatory Visit: Payer: Self-pay

## 2021-02-23 DIAGNOSIS — C50411 Malignant neoplasm of upper-outer quadrant of right female breast: Secondary | ICD-10-CM

## 2021-02-23 NOTE — Progress Notes (Signed)
Daily Session Note  Patient Details  Name: Kelsey Russo MRN: [id number] Date of Birth: 09/21/1965 Referring Provider:   April Manson Cancer Associated Rehabilitation & Exercise from 10/25/2020 in [facility] Card[facility] Re[facility]  Referring Provider Delight Hoh       Encounter Date: 02/23/2021  Check In:  Session Check In - 02/23/21 1232       Check-In   Supervising physician immediately available to respond to emergencies See telemetry face sheet for immediately available ER MD    Location [facility]-Cardiac & Pulmonary Rehab    Staff Present Birdie Sons, MPA, RN;Amanda Sommer, BA, ACSM CEP, Exercise Physiologist;Kara Eliezer Bottom, MS, ASCM CEP, Exercise Physiologist    Virtual Visit No    Medication changes reported     Yes    Comments decreased losartan and increase zoloft to increase appetite    Fall or balance concerns reported    No    Tobacco Cessation No Change    Current number of cigarettes/nicotine per day     20    Warm-up and Cool-down Performed on first and last piece of equipment    Resistance Training Performed Yes    VAD Patient? No    PAD/SET Patient? No      Pain Assessment   Currently in Pain? No/denies                Social History   Tobacco Use  Smoking Status Every Day   Packs/day: 0.25   Years: 40.00   Pack years: 10.00   Types: Cigarettes  Smokeless Tobacco Never    Goals Met:  Independence with exercise equipment Exercise tolerated well No report of cardiac concerns or symptoms Strength training completed today  Goals Unmet:  Not Applicable  Comments: Pt able to follow exercise prescription today without complaint.  Will continue to monitor for progression.  Yoga completed today by Exercise Physiologist, Nada Maclachlan.    Dr. Emily Filbert is Medical Director for [facility].  Dr. Ottie Glazier is Medical Director for [facility] Pulmonary Rehabilitation.

## 2021-03-09 ENCOUNTER — Encounter: Payer: Medicaid Other | Attending: Internal Medicine

## 2021-03-09 DIAGNOSIS — C50411 Malignant neoplasm of upper-outer quadrant of right female breast: Secondary | ICD-10-CM | POA: Insufficient documentation

## 2021-03-23 ENCOUNTER — Telehealth: Payer: Self-pay

## 2021-03-23 NOTE — Telephone Encounter (Signed)
Left message for patient regarding CARE program and attendance.

## 2021-04-03 ENCOUNTER — Telehealth: Payer: Self-pay

## 2021-04-03 NOTE — Telephone Encounter (Signed)
Attempted to call patient again regarding CARE- no answer, left message asking for call back.

## 2021-04-06 DIAGNOSIS — C50411 Malignant neoplasm of upper-outer quadrant of right female breast: Secondary | ICD-10-CM

## 2021-04-06 NOTE — Telephone Encounter (Signed)
No response back from patient. Last CARE appointment was on 6/16. Will be discharging from CARE at this time due to lack of attendance and no point of contact.

## 2021-04-06 NOTE — Progress Notes (Signed)
CARE Discharge Progress Report  Patient Details  Name: CHERRIE WELLHAUSEN MRN: [id number] Date of Birth: [birth year] Referring Provider:   April Manson Cancer Associated Rehabilitation & Exercise from 10/25/2020 in [facility] Cardiac and Pulmonary Rehab  Referring Provider Delight Hoh        Number of Visits: 13/36  Reason for Discharge:  Early Exit:  Lack of attendance  Smoking History:  Social History   Tobacco Use  Smoking Status Every Day   Packs/day: 0.25   Years: 40.00   Pack years: 10.00   Types: Cigarettes  Smokeless Tobacco Never    Diagnosis:  Primary cancer of upper outer quadrant of right female breast ([place])  ADL UCSD:   Initial Exercise Prescription:  Initial Exercise Prescription - 10/25/20 1400       Date of Initial Exercise RX and Referring Provider   Date 10/25/20    Referring Provider Delight Hoh      Treadmill   MPH 1.7    Grade 0    Minutes 15    METs 2.3      Recumbant Bike   Level 2    RPM 60    Minutes 15    METs 3.7      NuStep   Level 2    SPM 80    Minutes 15    METs 3.7      REL-XR   Level 1    Speed 50    Minutes 15    METs 3.7      Prescription Details   Frequency (times per week) 2    Duration Progress to 30 minutes of continuous aerobic without signs/symptoms of physical distress      Intensity   THRR 40-80% of Max Heartrate 116-148    Ratings of Perceived Exertion 11-13    Perceived Dyspnea 0-4      Progression   Progression Continue to progress workloads to maintain intensity without signs/symptoms of physical distress.      Resistance Training   Training Prescription Yes    Weight 3 lb    Reps 10-15             Discharge Exercise Prescription (Final Exercise Prescription Changes):   Functional Capacity:  6 Minute Walk     Row Name 10/25/20 1401         6 Minute Walk   Phase Initial     Distance 1070 feet     Walk Time 6 minutes     # of Rest Breaks 0     MPH 2.02     METS  3.76     RPE 12     Perceived Dyspnea  1     VO2 Peak 13.17     Symptoms No     Resting HR 85 bpm     Resting BP 122/82     Resting Oxygen Saturation  97 %     Exercise Oxygen Saturation  during 6 min walk 87 %     Max Ex. HR 99 bpm     Max Ex. BP 158/80     2 Minute Post BP 136/78              Nutrition & Weight - Outcomes:  Pre Biometrics - 10/25/20 1402       Pre Biometrics   Height 5' 5.5" (1.664 m)    Weight 130 lb 4.8 oz (59.1 kg)    BMI (Calculated) 21.35    Single Leg Stand 5.75  seconds              Goals reviewed with patient; copy given to patient.

## 2021-04-13 ENCOUNTER — Other Ambulatory Visit: Payer: Self-pay | Admitting: Oncology

## 2021-04-17 ENCOUNTER — Other Ambulatory Visit: Payer: Self-pay | Admitting: Oncology

## 2021-05-05 ENCOUNTER — Other Ambulatory Visit: Payer: Self-pay | Admitting: Obstetrics

## 2021-05-05 DIAGNOSIS — B3731 Acute candidiasis of vulva and vagina: Secondary | ICD-10-CM

## 2021-05-05 DIAGNOSIS — B373 Candidiasis of vulva and vagina: Secondary | ICD-10-CM

## 2021-06-30 ENCOUNTER — Other Ambulatory Visit: Payer: Self-pay | Admitting: Oncology

## 2021-08-15 ENCOUNTER — Other Ambulatory Visit: Payer: Self-pay | Admitting: Oncology

## 2021-08-26 IMAGING — CT CT CHEST-ABD-PELV W/ CM
2 of 5 series · 12 of 36 positions shown, 14 images · IV contrast (omnipaque)
Comparison: CT chest abdomen pelvis December 01, 2019

CLINICAL DATA: History of right breast cancer with mastectomy in
7875 now with loss of appetite and nausea since [DATE] and 30
pounds of weight loss

EXAM:
CT CHEST, ABDOMEN, AND PELVIS WITH CONTRAST
TECHNIQUE: Multidetector CT imaging of the chest, abdomen and pelvis was
performed following the standard protocol during bolus
administration of intravenous contrast.
CONTRAST:  100mL OMNIPAQUE IOHEXOL 300 MG/ML  SOLN

[Series 2: axials cap 5.00 · axial · 0.62mm/px · z∈[-1451,-941]mm · 9 of 128 slices shown, 11 images]
[im 13/128  mediastinal]
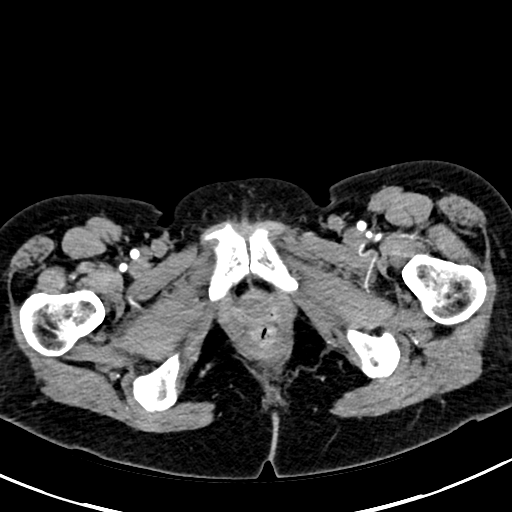
[im 13/128  bone]
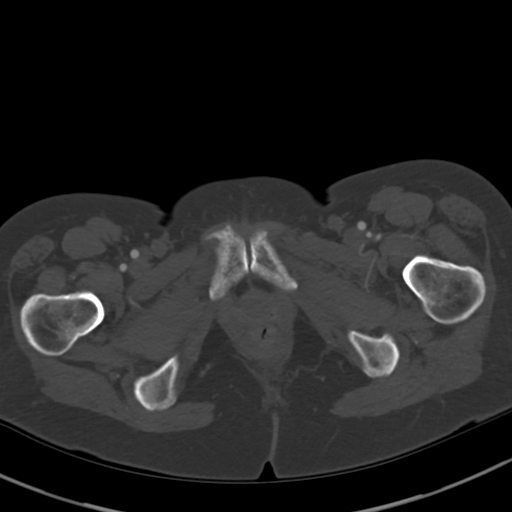
[im 26/128  mediastinal]
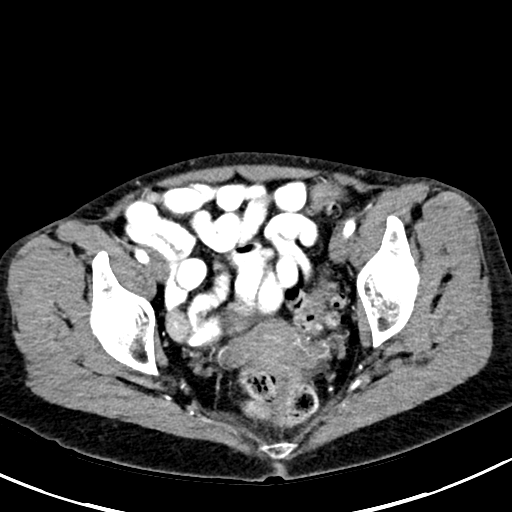
[im 39/128  mediastinal]
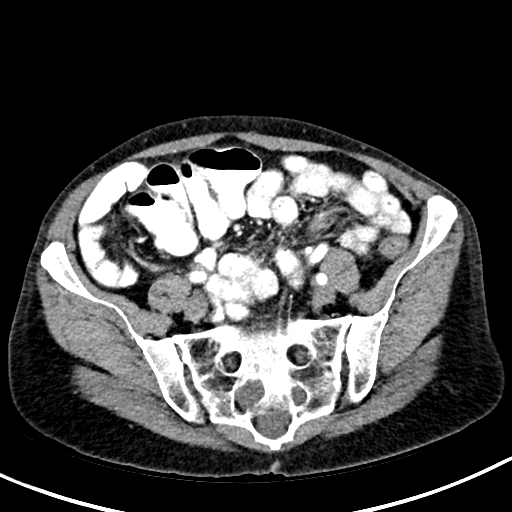
[im 51/128  mediastinal]
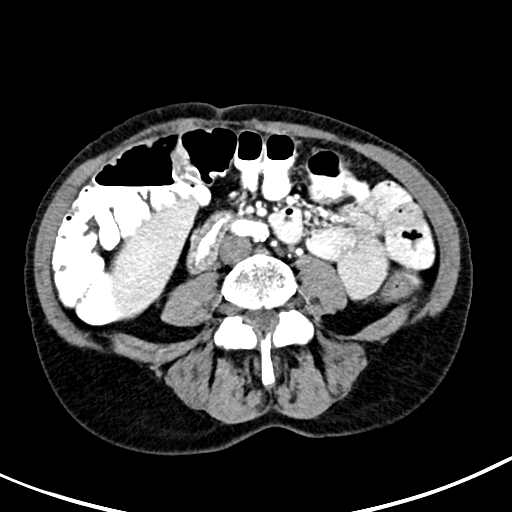
[im 64/128  mediastinal]
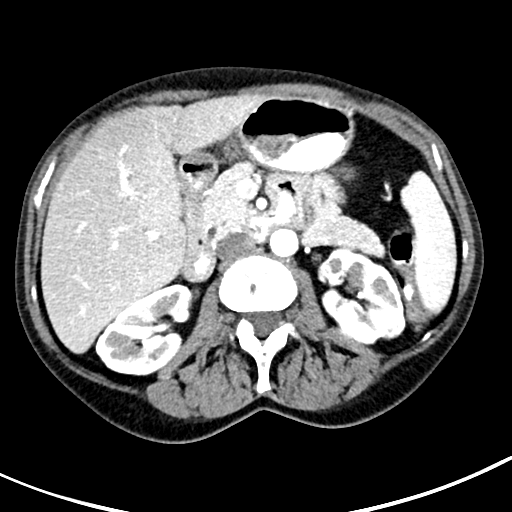
[im 77/128  mediastinal]
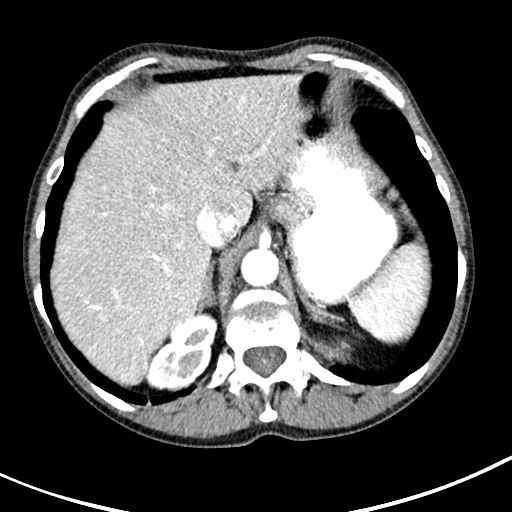
[im 89/128  mediastinal]
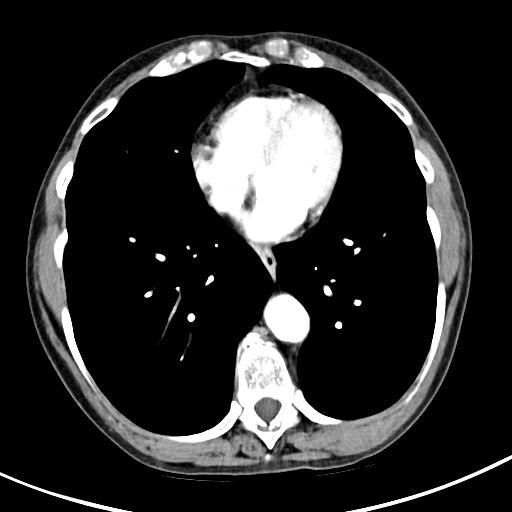
[im 102/128  mediastinal]
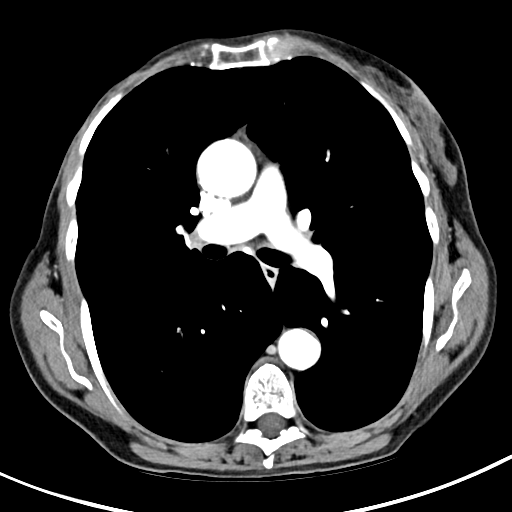
[im 115/128  mediastinal]
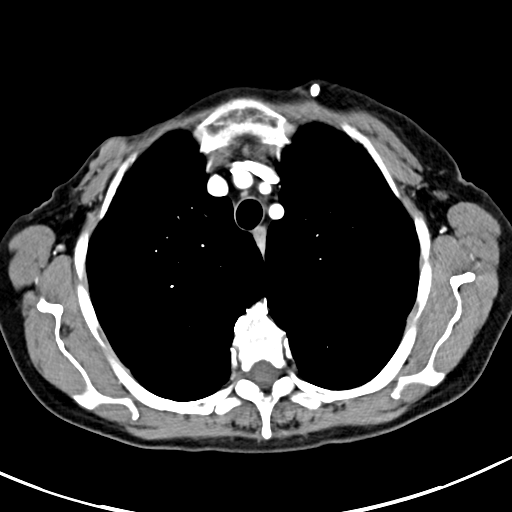
[im 115/128  bone]
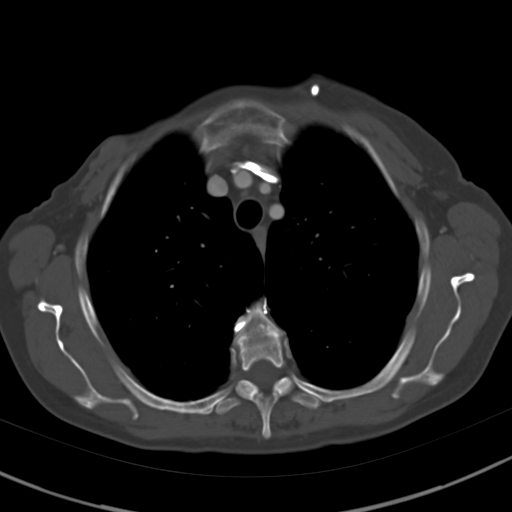

[Series 4: coronals cap 2.00 cor · coronal · 0.62mm/px · 3 of 152 slices shown]
[im 31/152  mediastinal]
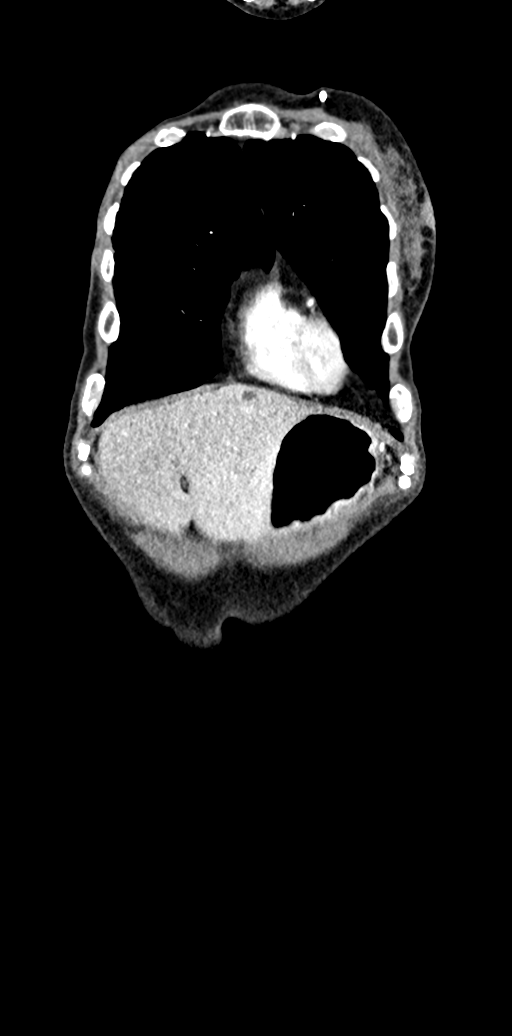
[im 61/152  mediastinal]
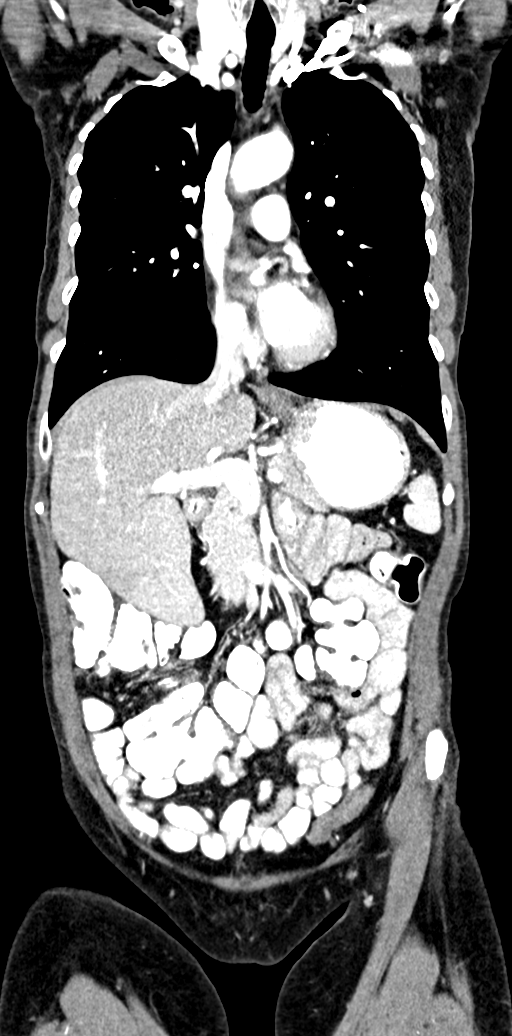
[im 91/152  mediastinal]
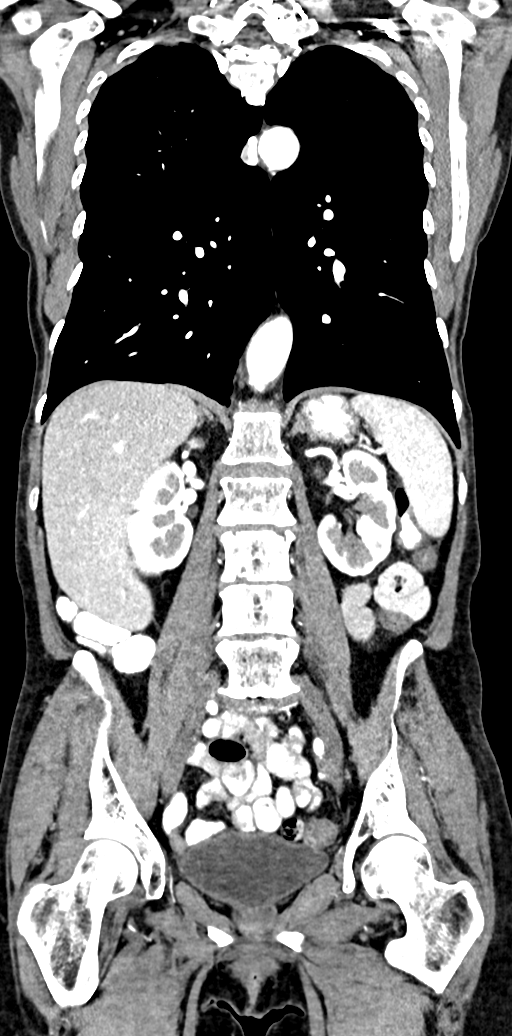

[12 of 36 positions shown; findings below may reference images not displayed]

FINDINGS: CT CHEST FINDINGS

Cardiovascular: Left chest porta catheter with tip at the superior
cavoatrial junction. Scattered aortic and great vessel
atherosclerosis. No thoracic aortic aneurysm. No central pulmonary
embolus. Coronary artery calcifications. Normal size heart. No
significant pericardial effusion/thickening.

Mediastinum/Nodes: No pathologically enlarged mediastinal, hilar or
axillary lymph nodes. No discrete thyroid nodule. Trachea and
esophagus are grossly unremarkable.

Lungs/Pleura: Moderate to the to severe centrilobular emphysema.
Similar right apical nodular scarring. Scarring or atelectasis in
the lung bases. No suspicious pulmonary nodules or masses. Pulmonary
hyperinflation. No pleural effusion. No pneumothorax.

Musculoskeletal: Changes of right mastectomy. No aggressive lytic or
blastic lesions of bone. No acute osseous abnormality.

CT ABDOMEN PELVIS FINDINGS

Hepatobiliary: Bilobar hepatic cysts are again visualized largest of
which measures up to 3.3 cm in the central left hepatic lobe on
image 57/2. No suspicious hepatic lesion. Gallbladder is grossly
unremarkable. No biliary ductal dilation.

Pancreas: Unremarkable. No pancreatic ductal dilatation or
surrounding inflammatory changes.

Spleen: Normal in size without focal abnormality.

Adrenals/Urinary Tract: Adrenal glands are unremarkable. Small
bilateral renal cysts. Kidneys are without renal calculi, solid
enhancing lesion, or hydronephrosis. Bladder is unremarkable.

Stomach/Bowel: Stomach is within normal limits. Enteric contrast
traverses the transverse colon. Sigmoid colonic diverticulosis
without findings of acute diverticulitis. No evidence of bowel wall
thickening, distention, or inflammatory changes.

Vascular/Lymphatic: Aortic atherosclerosis. No enlarged abdominal or
pelvic lymph nodes.

Reproductive: Uterus and bilateral adnexa are unremarkable.

Other: No abdominopelvic ascites.

Musculoskeletal: Multilevel degenerative changes spine. Sacral
Tarlov cysts. No aggressive lytic or blastic lesion of bone. No
acute osseous abnormality.
IMPRESSION: 1. No acute findings or evidence of metastatic disease in the chest,
abdomen, or pelvis.
2. Sigmoid colonic diverticulosis without findings of acute
diverticulitis.
3. Emphysema and aortic atherosclerosis.

Aortic Atherosclerosis (V8DSO-YFG.G) and Emphysema (V8DSO-7GC.Z).

## 2021-08-28 NOTE — Progress Notes (Deleted)
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  INTERVAL HISTORY: Patient returns to clinic today for routine 78-month evaluation.  She is tolerating tamoxifen, Zoloft, and Fosamax well without significant side effects.  She no longer is having "personality changes".  Her weight is improving and she has gained 3 additional pounds.  She has a mild peripheral neuropathy that does not affect her day-to-day activity.  She has no other neurologic complaints.  She does not complain of weakness or fatigue today.  She has a good appetite.  She denies any recent fevers or illnesses. She has no chest pain, shortness of breath, cough, or hemoptysis.  She denies any nausea, vomiting, constipation, or diarrhea.  She has no urinary complaints.  Patient offers no further specific complaints today.  REVIEW OF SYSTEMS:   Review of Systems  Constitutional: Negative.  Negative for fever, malaise/fatigue and weight loss.  HENT:  Negative for sore throat.   Respiratory: Negative.  Negative for cough, hemoptysis and shortness of breath.   Cardiovascular: Negative.  Negative for chest pain, palpitations and leg swelling.  Gastrointestinal:  Negative for abdominal pain, diarrhea, nausea and vomiting.  Genitourinary: Negative.  Negative for dysuria.  Musculoskeletal: Negative.  Negative for  back pain and myalgias.  Skin: Negative.  Negative for rash.  Neurological:  Positive for sensory change. Negative for dizziness, speech change, focal weakness, weakness and headaches.  Psychiatric/Behavioral: Negative.  Negative for depression. The patient is not nervous/anxious and does not have insomnia.    As per HPI. Otherwise, a complete review of systems is negative.  PAST MEDICAL HISTORY: Past Medical History:  Diagnosis Date   Anemia    during chemotherapy   Brain tumor (benign) ([place])    Breast cancer ([place]) 1999   Status post right breast lumpectomy and radiation   Breast cancer ([place]) 11/2019   right breast   COPD (chronic obstructive pulmonary disease) ([facility])    Depression    History of chemotherapy 1999   History of seizure disorder    Hypertension    Personal history of chemotherapy    breast cancer 2021   Personal history of radiation therapy 1999   Pneumonia    PONV (postoperative nausea and vomiting)    vomiting    PAST SURGICAL HISTORY: Past Surgical History:  Procedure Laterality Date   BRAIN SURGERY     Brain tumor resection  1991   BREAST BIOPSY Right 1999   Positive   BREAST BIOPSY Right 11/10/2019   [place] bx 10:00 heart marker, positive   BREAST BIOPSY Right 11/10/2019   [place] bx axilla, hydromark 4, positive   BREAST BIOPSY Left 11/10/2019   [place] bx 2:00, venus marker, neg   MASTECTOMY Right 07/13/2020   positive   PORTACATH PLACEMENT Left 11/30/2019   Procedure: INSERTION PORT-A-CATH;  Surgeon: Robert Bellow, MD;  Location: [facility] ORS;  Service: General;  Laterality: Left;   Right breast lumpectomy  SIMPLE MASTECTOMY WITH AXILLARY SENTINEL NODE BIOPSY Right 07/13/2020   Procedure: SIMPLE MASTECTOMY WITH AXILLARY SENTINEL NODE BIOPSY;  Surgeon: Robert Bellow, MD;  Location: [facility] ORS;  Service: General;  Laterality: Right;   TONSILLECTOMY     TUBAL LIGATION      FAMILY HISTORY: Family History  Problem Relation Age of Onset   Depression  Mother    CAD Father    Hypertension Father    Lung cancer Father    Coronary artery disease Father 21   Breast cancer Maternal Grandmother     ADVANCED DIRECTIVES (Y/N):  N  HEALTH MAINTENANCE: Social History   Tobacco Use   Smoking status: Every Day    Packs/day: 0.25    Years: 40.00    Pack years: 10.00    Types: Cigarettes   Smokeless tobacco: Never  Vaping Use   Vaping Use: Never used  Substance Use Topics   Alcohol use: No    Alcohol/week: 0.0 standard drinks   Drug use: Yes    Frequency: 3.0 times per week    Types: Marijuana     Colonoscopy:  PAP:  Bone density:  Lipid panel:  Allergies  Allergen Reactions   Imitrex [Sumatriptan] Anaphylaxis   Penicillins Rash    Did it involve swelling of the face/tongue/throat, SOB, or low BP? Unknown Did it involve sudden or severe rash/hives, skin peeling, or any reaction on the inside of your mouth or nose? Unknown Did you need to seek medical attention at a hospital or doctor's office? Unknown When did it last happen? ? If all above answers are NO, may proceed with cephalosporin use.     Current Outpatient Medications  Medication Sig Dispense Refill   acetaminophen (TYLENOL) 500 MG tablet Take 500-1,000 mg by mouth every 6 (six) hours as needed for moderate pain.      albuterol (VENTOLIN HFA) 108 (90 Base) MCG/ACT inhaler Inhale 1-2 puffs into the lungs every 6 (six) hours as needed for wheezing or shortness of breath.     alendronate (FOSAMAX) 70 MG tablet TAKE 1 TABLET (70 MG) BY MOUTH ONCE A WEEK. TAKE WITH A FULL GLASS OF WATER ONAN EMTPY STOMACH. 4 tablet 3   clonazePAM (KLONOPIN) 1 MG tablet Take 1 mg by mouth 3 (three) times daily as needed for anxiety.     diphenoxylate-atropine (LOMOTIL) 2.5-0.025 MG tablet Take 1 tablet by mouth 4 (four) times daily as needed for diarrhea or loose stools. (Patient not taking: Reported on 02/09/2021) 60 tablet 0   fluconazole (DIFLUCAN) 150 MG tablet TAKE 1 TABLET BY MOUTH  ONCE FOR 1 DOSE. CAN TAKE ADDITIONAL DOSE 3 DAYS LATER IFSYMPTOMS PERSIST 2 tablet 0   losartan (COZAAR) 100 MG tablet Take 100 mg by mouth daily.     mirtazapine (REMERON) 15 MG tablet Take 1 tablet (15 mg total) by mouth at bedtime. 30 tablet 0   nicotine (NICODERM CQ - DOSED IN MG/24 HOURS) 14 mg/24hr patch Place 14 mg onto the skin daily. (Patient not taking: Reported on 02/09/2021)     ondansetron (ZOFRAN) 8 MG tablet TAKE 1 TABLET BY MOUTH 2 TIMES DAILY AS NEEDED 60 tablet 2   oxyCODONE (OXY IR/ROXICODONE) 5 MG immediate release tablet Take 5 mg by mouth every 6 (six) hours as needed for severe pain. PRN BID (Patient not taking: No sig reported)     potassium chloride SA (KLOR-CON) 20 MEQ tablet Take 1 tablet (20 mEq total) by mouth 2 (two) times daily. 30 tablet  1   prochlorperazine (COMPAZINE) 10 MG tablet Take 1 tablet (10 mg total) by mouth every 6 (six) hours as needed for nausea or vomiting. 60 tablet 2   Pseudoeph-Doxylamine-DM-APAP (NYQUIL PO) Take 1-2 capsules by mouth at bedtime as needed (sleep.).      sertraline (ZOLOFT) 25 MG tablet TAKE 1 TABLET BY MOUTH ONCE A DAY 30 tablet 1   tamoxifen (NOLVADEX) 20 MG tablet TAKE 1 TABLET BY MOUTH ONCE A DAY 30 tablet 3   tetrahydrozoline 0.05 % ophthalmic solution Place 1-2 drops into both eyes 3 (three) times daily as needed (dry/irritated eyes).     traMADol (ULTRAM) 50 MG tablet Take 1 tablet (50 mg total) by mouth 2 (two) times daily. 60 tablet 0   No current facility-administered medications for this visit.   Facility-Administered Medications Ordered in Other Visits  Medication Dose Route Frequency Provider Last Rate Last Admin   0.9 %  sodium chloride infusion   Intravenous Once Jeralyn Ruths, MD 500 mL/hr at 04/06/20 1025 Rate Verify at 04/06/20 1025   sodium chloride flush (NS) 0.9 % injection 10 mL  10 mL Intravenous PRN Jeralyn Ruths, MD       sodium chloride flush (NS) 0.9 % injection 10 mL  10 mL Intravenous PRN  Jeralyn Ruths, MD   10 mL at 01/13/20 0851    OBJECTIVE: There were no vitals filed for this visit.    There is no height or weight on file to calculate BMI.    ECOG FS:0 - Asymptomatic   General: Well-developed, well-nourished, no acute distress. Eyes: Pink conjunctiva, anicteric sclera. HEENT: Normocephalic, moist mucous membranes. Breast: Exam deferred today. Lungs: No audible wheezing or coughing. Heart: Regular rate and rhythm. Abdomen: Soft, nontender, no obvious distention. Musculoskeletal: No edema, cyanosis, or clubbing. Neuro: Alert, answering all questions appropriately. Cranial nerves grossly intact. Skin: No rashes or petechiae noted. Psych: Normal affect.  LAB RESULTS:  Lab Results  Component Value Date   NA 130 (L) 11/25/2020   K 4.2 11/25/2020   CL 103 11/25/2020   CO2 20 (L) 11/25/2020   GLUCOSE 86 11/25/2020   BUN 8 11/25/2020   CREATININE 0.59 11/25/2020   CALCIUM 8.4 (L) 11/25/2020   PROT 6.3 (L) 11/25/2020   ALBUMIN 3.6 11/25/2020   AST 20 11/25/2020   ALT 18 11/25/2020   ALKPHOS 64 11/25/2020   BILITOT 0.5 11/25/2020   GFRNONAA >60 11/25/2020   GFRAA >60 04/20/2020    Lab Results  Component Value Date   WBC 7.4 11/25/2020   NEUTROABS 4.9 11/25/2020   HGB 14.0 11/25/2020   HCT 41.4 11/25/2020   MCV 88.5 11/25/2020   PLT 386 11/25/2020     STUDIES: No results found.  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   PLAN:    1. 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 She completed 4 cycles of Adriamycin and Cytoxan.  She only received 10 infusions of weekly Taxol her last one occurring on April 06, 2020.  Given her persistently decreased performance status and worsening neuropathy, treatment was discontinued. CT scan of  the chest, abdomen, and pelvis on December 01, 2019 reviewed independently with no obvious evidence of metastatic disease.  She underwent simple mastectomy on July 13, 2020 with significant improvement of her disease burden.  She did not require XRT given her simple mastectomy.  Letrozole was discontinued secondary to osteoporosis and patient was initiated on tamoxifen which she will require to take for a minimum of 5 years completing treatment in December 2026. Given her high risk disease, could possibly extend treatment for 7 to 10 years.  Return to clinic in 6 months for routine evaluation. 2.  History of DCIS: Status post lumpectomy and XRT.  Patient reports she did not take any adjuvant hormonal therapy.  Given this history, previously referred patient to genetic counseling for evaluation and testing. 3.  Back pain: Chronic and unchanged.  Patient was instructed to use ibuprofen sparingly.  Continue tramadol as needed.   4.  Myalgias/restless leg: Patient does not complain of this today. 5.  Palpitations/syncope: Appreciate cardiology input.  Continue follow-up with cardiology as scheduled.   6.  Depression: Improved.  Continue Zoloft 25 mg daily. 7.  Osteoporosis: Continue Fosamax as prescribed.  Repeat bone mineral density in December 2022.   Patient expressed understanding and was in agreement with this plan. She also understands that She can call clinic at any time with any questions, concerns, or complaints.    Cancer Staging  Primary cancer of upper outer quadrant of right female breast [facility]) Staging form: Breast, AJCC 8th Edition - Clinical stage from 11/26/2019: Stage IIIA (cT2, cN2a, cM0, G3, ER+, PR+, HER2-) - Signed by Lloyd Huger, MD on 11/26/2019 Stage prefix: Initial diagnosis Histologic grading system: 3 grade system   Lloyd Huger, MD   08/28/2021 9:36 AM

## 2021-08-29 ENCOUNTER — Other Ambulatory Visit: Payer: Medicaid Other

## 2021-08-30 ENCOUNTER — Telehealth: Payer: Self-pay | Admitting: Oncology

## 2021-08-30 NOTE — Telephone Encounter (Signed)
Pt called to reschedule her appt for 12-29.call back at [phone]

## 2021-08-31 ENCOUNTER — Inpatient Hospital Stay: Payer: Medicaid Other | Admitting: Oncology

## 2021-10-10 ENCOUNTER — Other Ambulatory Visit: Payer: Medicaid Other

## 2021-10-12 ENCOUNTER — Inpatient Hospital Stay: Payer: Medicaid Other | Admitting: Oncology

## 2021-10-27 ENCOUNTER — Encounter: Payer: Self-pay | Admitting: Oncology

## 2021-10-27 NOTE — Telephone Encounter (Signed)
Signing encounter see previous note on 11/09/20 °

## 2021-11-19 NOTE — Progress Notes (Signed)
?[place]  ?[phone] [phone] ? ?ID: Kelsey Russo OB: Dec 22, 1965  MR#: [id number]  JQB#:[id number] ? ?Patient Care Team: ?Perrin Maltese, MD as PCP - General (Internal Medicine) ?[name], MD as PCP - Cardiology (Cardiology) ?Rico Junker, RN as Registered Nurse ?Theodore Demark, RN as Oncology Nurse Navigator (Oncology) ?Lloyd Huger, MD as Consulting Physician (Oncology) ?Jeral Fruit, RN as Registered Nurse (Oncology) ?Robert Bellow, MD as Consulting Physician (General Surgery) ? 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 ? ?INTERVAL HISTORY: Patient returns to clinic today for routine 61-monthevaluation.  She currently feels well and is at her baseline.  She continues to tolerate tamoxifen and Fosamax without significant side effects.  She does not complain of a peripheral neuropathy today.  She has no neurologic complaints.  She does not complain of weakness or fatigue today.  She has a good appetite.  She denies any recent fevers or illnesses. She has no chest pain, shortness of breath, cough, or hemoptysis.  She denies any nausea, vomiting, constipation, or diarrhea.  She has no urinary complaints.  Patient offers no specific complaints today. ? ?REVIEW OF SYSTEMS:   ?Review of Systems  ?Constitutional: Negative.  Negative for fever, malaise/fatigue and weight loss.  ?HENT:  Negative for sore throat.   ?Respiratory: Negative.  Negative for cough, hemoptysis and shortness of breath.   ?Cardiovascular: Negative.  Negative for chest pain, palpitations and leg swelling.  ?Gastrointestinal:  Negative for abdominal pain, diarrhea, nausea and vomiting.  ?Genitourinary: Negative.  Negative for dysuria.  ?Musculoskeletal: Negative.  Negative for back pain and myalgias.  ?Skin: Negative.  Negative for rash.  ?Neurological: Negative.  Negative for  dizziness, sensory change, weakness and headaches.  ?Psychiatric/Behavioral: Negative.  Negative for depression. The patient is not nervous/anxious and does not have insomnia.   ? ?As per HPI. Otherwise, a complete review of systems is negative. ? ?PAST MEDICAL HISTORY: ?Past Medical History:  ?Diagnosis Date  ? Anemia   ? during chemotherapy  ? [place] tumor (benign) ([facility]   ? [place]  ? Status post right breast lumpectomy and radiation  ? Breast cancer ([facility] 11[place]  ? right breast  ? COPD (chronic obstructive pulmonary disease) ([facility]   ? Depression   ? History of chemotherapy 1999  ? History of seizure disorder   ? Hypertension   ? Personal history of chemotherapy   ? breast cancer 2021  ? Personal history of radiation therapy 1999  ? Pneumonia   ? PONV (postoperative nausea and vomiting)   ? vomiting  ? ? ?PAST SURGICAL HISTORY: ?Past Surgical History:  ?Procedure Laterality Date  ? BRAIN SURGERY    ? Brain tumor resection  1991  ? BREAST BIOPSY Right 1999  ? Positive  ? BREAST BIOPSY Right 11/10/2019  ? uKoreabx 10:00 heart marker, positive  ? BREAST BIOPSY Right 11/10/2019  ? uKoreabx axilla, hydromark 4, positive  ? BREAST BIOPSY Left 11/10/2019  ? uKoreabx 2:00, venus marker, neg  ? MASTECTOMY Right 07/13/2020  ? positive  ? PORTACATH PLACEMENT Left 11/30/2019  ? Procedure: INSERTION PORT-A-CATH;  Surgeon: BRobert Bellow MD;  Location: [facility] ORS;  Service: General;  Laterality: Left;  ? Right breast lumpectomy    ? SIMPLE MASTECTOMY WITH AXILLARY SENTINEL NODE BIOPSY Right 07/13/2020  ? Procedure: SIMPLE MASTECTOMY WITH AXILLARY SENTINEL NODE BIOPSY;  Surgeon: BBary Castilla  Forest Gleason, MD;  Location: [facility] ORS;  Service: General;  Laterality: Right;  ? TONSILLECTOMY    ? TUBAL LIGATION    ? ? ?FAMILY HISTORY: ?Family History  ?Problem Relation Age of Onset  ? Depression Mother   ? CAD Father   ? Hypertension Father   ? Lung cancer Father   ? Coronary artery disease Father 68  ? Breast cancer Maternal  Grandmother   ? ? ?ADVANCED DIRECTIVES (Y/N):  N ? ?HEALTH MAINTENANCE: ?Social History  ? ?Tobacco Use  ? Smoking status: Every Day  ?  Packs/day: 0.25  ?  Years: 40.00  ?  Pack years: 10.00  ?  Types: Cigarettes  ? Smokeless tobacco: Never  ?Vaping Use  ? Vaping Use: Never used  ?Substance Use Topics  ? Alcohol use: No  ?  Alcohol/week: 0.0 standard drinks  ? Drug use: Yes  ?  Frequency: 3.0 times per week  ?  Types: Marijuana  ? ? ? Colonoscopy: ? PAP: ? Bone density: ? Lipid panel: ? ?Allergies  ?Allergen Reactions  ? Imitrex [Sumatriptan] Anaphylaxis  ? Penicillins Rash  ?  Did it involve swelling of the face/tongue/throat, SOB, or low BP? Unknown ?Did it involve sudden or severe rash/hives, skin peeling, or any reaction on the inside of your mouth or nose? Unknown ?Did you need to seek medical attention at a hospital or doctor's office? Unknown ?When did it last happen? ? ?If all above answers are ?NO?, may proceed with cephalosporin use. ?  ? ? ?Current Outpatient Medications  ?Medication Sig Dispense Refill  ? acetaminophen (TYLENOL) 500 MG tablet Take 500-1,000 mg by mouth every 6 (six) hours as needed for moderate pain.     ? albuterol (VENTOLIN HFA) 108 (90 Base) MCG/ACT inhaler Inhale 1-2 puffs into the lungs every 6 (six) hours as needed for wheezing or shortness of breath.    ? alendronate (FOSAMAX) 70 MG tablet TAKE 1 TABLET (70 MG) BY MOUTH ONCE A WEEK. TAKE WITH A FULL GLASS OF WATER ONAN EMTPY STOMACH. 4 tablet 3  ? clonazePAM (KLONOPIN) 1 MG tablet Take 1 mg by mouth 3 (three) times daily as needed for anxiety.    ? losartan (COZAAR) 100 MG tablet Take 100 mg by mouth daily.    ? Pseudoeph-Doxylamine-DM-APAP (NYQUIL PO) Take 1-2 capsules by mouth at bedtime as needed (sleep.).     ? sertraline (ZOLOFT) 50 MG tablet Take 50 mg by mouth daily.    ? tamoxifen (NOLVADEX) 20 MG tablet TAKE 1 TABLET BY MOUTH ONCE A DAY 30 tablet 3  ? tetrahydrozoline 0.05 % ophthalmic solution Place 1-2 drops into both  eyes 3 (three) times daily as needed (dry/irritated eyes).    ? tiZANidine (ZANAFLEX) 2 MG tablet Take by mouth.    ? traMADol (ULTRAM) 50 MG tablet Take 1 tablet (50 mg total) by mouth 2 (two) times daily. 60 tablet 0  ? triamcinolone cream (KENALOG) 0.1 % SMARTSIG:1 Application Topical 2-3 Times Daily    ? zolpidem (AMBIEN) 10 MG tablet Take 10 mg by mouth at bedtime as needed.    ? diphenoxylate-atropine (LOMOTIL) 2.5-0.025 MG tablet Take 1 tablet by mouth 4 (four) times daily as needed for diarrhea or loose stools. (Patient not taking: Reported on 02/09/2021) 60 tablet 0  ? fluconazole (DIFLUCAN) 150 MG tablet TAKE 1 TABLET BY MOUTH ONCE FOR 1 DOSE. CAN TAKE ADDITIONAL DOSE 3 DAYS LATER IFSYMPTOMS PERSIST (Patient not taking: Reported on 11/23/2021) 2 tablet 0  ?  mirtazapine (REMERON) 15 MG tablet Take 1 tablet (15 mg total) by mouth at bedtime. (Patient not taking: Reported on 11/23/2021) 30 tablet 0  ? nicotine (NICODERM CQ - DOSED IN MG/24 HOURS) 14 mg/24hr patch Place 14 mg onto the skin daily. (Patient not taking: Reported on 02/09/2021)    ? ondansetron (ZOFRAN) 8 MG tablet TAKE 1 TABLET BY MOUTH 2 TIMES DAILY AS NEEDED (Patient not taking: Reported on 11/23/2021) 60 tablet 2  ? oxyCODONE (OXY IR/ROXICODONE) 5 MG immediate release tablet Take 5 mg by mouth every 6 (six) hours as needed for severe pain. PRN BID (Patient not taking: Reported on 11/25/2020)    ? potassium chloride SA (KLOR-CON) 20 MEQ tablet Take 1 tablet (20 mEq total) by mouth 2 (two) times daily. (Patient not taking: Reported on 11/23/2021) 30 tablet 1  ? pregabalin (LYRICA) 50 MG capsule Take 50 mg by mouth 2 (two) times daily. (Patient not taking: Reported on 11/23/2021)    ? prochlorperazine (COMPAZINE) 10 MG tablet Take 1 tablet (10 mg total) by mouth every 6 (six) hours as needed for nausea or vomiting. (Patient not taking: Reported on 11/23/2021) 60 tablet 2  ? sertraline (ZOLOFT) 25 MG tablet TAKE 1 TABLET BY MOUTH ONCE A DAY (Patient not  taking: Reported on 11/23/2021) 30 tablet 1  ? traZODone (DESYREL) 50 MG tablet Take 100 mg by mouth at bedtime as needed. (Patient not taking: Reported on 11/23/2021)    ? ?No current facility-administered medications

## 2021-11-21 ENCOUNTER — Ambulatory Visit
Admission: RE | Admit: 2021-11-21 | Discharge: 2021-11-21 | Disposition: A | Discharge status: Home / Self Care | Payer: Medicaid Other | Source: Ambulatory Visit | Attending: Oncology | Admitting: Oncology

## 2021-11-21 ENCOUNTER — Other Ambulatory Visit: Payer: Self-pay

## 2021-11-21 DIAGNOSIS — C50411 Malignant neoplasm of upper-outer quadrant of right female breast: Secondary | ICD-10-CM | POA: Insufficient documentation

## 2021-11-23 ENCOUNTER — Inpatient Hospital Stay: Payer: Medicaid Other | Attending: Oncology | Admitting: Oncology

## 2021-11-23 ENCOUNTER — Other Ambulatory Visit: Payer: Self-pay

## 2021-11-23 VITALS — BP 114/92 | HR 102 | Temp 97.9°F | Resp 16 | Wt 149.5 lb

## 2021-11-23 DIAGNOSIS — Z170 Estrogen receptor positive status [ER+]: Secondary | ICD-10-CM | POA: Insufficient documentation

## 2021-11-23 DIAGNOSIS — Z923 Personal history of irradiation: Secondary | ICD-10-CM | POA: Diagnosis not present

## 2021-11-23 DIAGNOSIS — F32A Depression, unspecified: Secondary | ICD-10-CM | POA: Diagnosis not present

## 2021-11-23 DIAGNOSIS — R55 Syncope and collapse: Secondary | ICD-10-CM | POA: Insufficient documentation

## 2021-11-23 DIAGNOSIS — M810 Age-related osteoporosis without current pathological fracture: Secondary | ICD-10-CM | POA: Insufficient documentation

## 2021-11-23 DIAGNOSIS — F17210 Nicotine dependence, cigarettes, uncomplicated: Secondary | ICD-10-CM | POA: Insufficient documentation

## 2021-11-23 DIAGNOSIS — R002 Palpitations: Secondary | ICD-10-CM | POA: Diagnosis not present

## 2021-11-23 DIAGNOSIS — Z86000 Personal history of in-situ neoplasm of breast: Secondary | ICD-10-CM | POA: Insufficient documentation

## 2021-11-23 DIAGNOSIS — C50411 Malignant neoplasm of upper-outer quadrant of right female breast: Secondary | ICD-10-CM | POA: Insufficient documentation

## 2021-11-23 DIAGNOSIS — Z79899 Other long term (current) drug therapy: Secondary | ICD-10-CM | POA: Diagnosis not present

## 2021-11-23 DIAGNOSIS — Z79810 Long term (current) use of selective estrogen receptor modulators (SERMs): Secondary | ICD-10-CM | POA: Insufficient documentation

## 2021-11-23 NOTE — Progress Notes (Signed)
Pt states she has been experiencing joint pains.  ?

## 2021-11-24 ENCOUNTER — Encounter: Payer: Self-pay | Admitting: Oncology

## 2021-12-13 ENCOUNTER — Other Ambulatory Visit: Payer: Self-pay | Admitting: Oncology

## 2022-01-11 ENCOUNTER — Ambulatory Visit
Admission: RE | Admit: 2022-01-11 | Discharge: 2022-01-11 | Disposition: A | Discharge status: Home / Self Care | Payer: Medicaid Other | Source: Ambulatory Visit | Attending: Oncology | Admitting: Oncology

## 2022-01-11 DIAGNOSIS — C50411 Malignant neoplasm of upper-outer quadrant of right female breast: Secondary | ICD-10-CM | POA: Diagnosis present

## 2022-01-11 DIAGNOSIS — Z1231 Encounter for screening mammogram for malignant neoplasm of breast: Secondary | ICD-10-CM | POA: Diagnosis not present

## 2022-01-26 ENCOUNTER — Telehealth: Payer: Self-pay

## 2022-01-26 NOTE — Telephone Encounter (Signed)
Nutrition Follow-up:  Patient called RD requesting more boost shakes.  56 year old female with right breast cancer currently on tamoxifen.  Patient completed neoadjuvant chemotherapy, 10 infusions of taxol due to decreased performance status and worsening neuropathy, simple mastectomy on 07/2020.  Continues on tamoxifen.    Spoke with patient via phone.  Reports that at least 1 time per month for about 4 days or more does not want to eat, has bad taste in her mouth but can drink boost shakes and usually drinks 4 a day during this time period. Has been running short on shakes at the end of the month and can't afford to buy more.  Otherwise drinks about 2 shakes per day.  Has been trying to eat solid foods to keep weight up.     Medications: reviewed  Labs: reviewed  Anthropometrics:   Weight 149 lb 8 oz on 3/23 130 lb on 6/9 127 lb on 11/23/20   INTERVENTION:  Recommend increasing boost plus to TID for added nutrition and weight maintenance/gain Will send information to MD team to submit to [facility]  Patient to contact RD if has more questions or concerns     NEXT VISIT: no follow-up RD available as needed  Tyera Hansley B. Zenia Resides, [place], [place] Registered Dietitian [phone]

## 2022-01-26 NOTE — Telephone Encounter (Signed)
Pt has questions regarding another order sent in for  Boost. She would like for you to give her a call back.

## 2022-01-30 ENCOUNTER — Telehealth: Payer: Self-pay

## 2022-01-30 NOTE — Telephone Encounter (Signed)
  Per Desmond Lope-  Ms Dulski called me requesting an increase in her order for boost plus shakes to keep her weight up.  Appetite for solid foods vary during the month and some days drinks more boost shakes.  [facility] is sending her shakes.     Please fax the following to [place] [phone]) for order change   Signed Rx (boost plus TID)  MD chart notes  Demographics   Let me know if you have questions.   Thanks,  Joli     -Rx sent per request.

## 2022-02-01 ENCOUNTER — Telehealth: Payer: Self-pay

## 2022-02-01 NOTE — Telephone Encounter (Signed)
Nutrition  Left message for patient letting her know that new prescription for boost shakes has been sent to [facility] and for her to follow up with [facility] [phone].  Also left RD's contact information if she has further questions.  Gregoire Bennis B. Zenia Resides, [place], [place] Registered Dietitian [phone]

## 2022-02-19 ENCOUNTER — Encounter: Payer: Self-pay | Admitting: Oncology

## 2022-02-19 ENCOUNTER — Inpatient Hospital Stay: Payer: Medicaid Other | Attending: Oncology | Admitting: Medical Oncology

## 2022-02-19 VITALS — BP 104/70 | HR 85 | Temp 97.6°F | Resp 17 | Wt 152.5 lb

## 2022-02-19 DIAGNOSIS — M792 Neuralgia and neuritis, unspecified: Secondary | ICD-10-CM

## 2022-02-19 DIAGNOSIS — Z79810 Long term (current) use of selective estrogen receptor modulators (SERMs): Secondary | ICD-10-CM | POA: Diagnosis not present

## 2022-02-19 DIAGNOSIS — Z79899 Other long term (current) drug therapy: Secondary | ICD-10-CM

## 2022-02-19 DIAGNOSIS — M818 Other osteoporosis without current pathological fracture: Secondary | ICD-10-CM | POA: Diagnosis not present

## 2022-02-19 DIAGNOSIS — Z7189 Other specified counseling: Secondary | ICD-10-CM

## 2022-02-19 DIAGNOSIS — C50411 Malignant neoplasm of upper-outer quadrant of right female breast: Secondary | ICD-10-CM

## 2022-02-19 DIAGNOSIS — R634 Abnormal weight loss: Secondary | ICD-10-CM | POA: Diagnosis not present

## 2022-02-19 DIAGNOSIS — G8929 Other chronic pain: Secondary | ICD-10-CM | POA: Insufficient documentation

## 2022-02-19 DIAGNOSIS — Z170 Estrogen receptor positive status [ER+]: Secondary | ICD-10-CM | POA: Insufficient documentation

## 2022-02-19 DIAGNOSIS — F17210 Nicotine dependence, cigarettes, uncomplicated: Secondary | ICD-10-CM | POA: Insufficient documentation

## 2022-02-19 DIAGNOSIS — Z923 Personal history of irradiation: Secondary | ICD-10-CM | POA: Insufficient documentation

## 2022-02-19 DIAGNOSIS — Z9221 Personal history of antineoplastic chemotherapy: Secondary | ICD-10-CM | POA: Insufficient documentation

## 2022-02-19 DIAGNOSIS — F32A Depression, unspecified: Secondary | ICD-10-CM | POA: Diagnosis not present

## 2022-02-19 NOTE — Research (Addendum)
SWOG U2725 Week 104 Assessment:  Patient in to clinic for her scheduled week 104 assessment unaccompanied this afternoon.The patient completed her PRO questionnaires for the protocol without any difficulties. Nelwyn Salisbury, PA-C in to see patient and assess solicited AE's for which the patient has complaints of moderate numbness and tingling to her feet on a daily basis. She is rating her pain a 7 out of 10 related to the neuropathy. She states her primary care provider has let her try Gabapentin and Lyrica for which neither one has helped her at all. She states she is currently not taking anything for her neuropathic pain. She states that when she is wearing shoes and ambulating her feet will feel like they are sweating but they aren't, this makes walking more difficult. The [facility] [place]-2102 protocol was introduced to the patient briefly, she states she is interested in participating. Nelwyn Salisbury, NP in to see patient for her assessment, updated on neuropathy issues she is having currently. A copy of the [facility] [place]-2102 protocol consent was given to the patient with a clinical trials brochure and the nurses business card. This research nurse reminded the patient she will see her again for her next assessment at her 156 week appointment for the S1714 protocol. The patient was encouraged to call if she has any questions or concerns before her next visit. The patient voiced understanding of all procedures explained and is in agreement with them. Research RN spent approximately twenty minutes with the patient to complete all of her protocol related activities. Research nurse will call the patient in the next couple of days to give her time to review the [facility]CRU -2102 protocol and see if she is still interested.     Kelsey Russo [id number] 02/19/2022  Adverse Event Log  Study/Protocol: Q2595 Cycle: Week 104  Event Grade Onset Date Resolved Date Drug Name Attribution Treatment Comments   Dysesthesia 0          Neuralgia 0         Paresthesia 0         Peripheral Motor Neuropathy 0         Peripheral Sensory Neuropathy  Grade 2  03/02/2020  07/20/2020  Related   None    Fatigue Grade 1 03/02/2020   Possible None   Pain Grade 2 07/18/2020   Not related Hydrocodone Pain related to neuropathy in her hands and feet of a "7"    Jeral Fruit, RN, BSN, OCN 02/19/22 1:58 PM

## 2022-02-19 NOTE — Progress Notes (Signed)
[place]  [phone] [phone]  ID: Kelsey Russo Russo OB: [birth year]  MR#: [id number]  MPN#:[id number]  Kelsey Russo Care Team: Perrin Maltese, MD as PCP - General (Internal Medicine) End, Harrell Gave, MD as PCP - Cardiology (Cardiology) Rico Junker, RN as Registered Nurse Theodore Demark, RN (Inactive) as Oncology Nurse Navigator (Oncology) Lloyd Huger, MD as Consulting Physician (Oncology) Jeral Fruit, RN as Registered Nurse (Oncology) Bary Castilla Forest Gleason, MD as Consulting Physician (General Surgery)  CHIEF COMPLAINT: Stage pT2, pN0, M0 ER/PR positive, 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  INTERVAL HISTORY: Kelsey Russo Russo return to clinic today for routine 67-month evaluation. She reports that she is doing well but does describe 7/10 nerve like pain which is chronic for her. Open to studies related to post chemotherapy pain. She denies any new lumps, bumps, discharge, pain of breast or axilla. No weight loss or new night sweats. Last mammogram was on 01/11/2022 which was benign appearing. Last DEXA was on 11/21/2021.   REVIEW OF SYSTEMS:   Review of Systems  Constitutional: Negative.  Negative for fever, malaise/fatigue and weight loss.  HENT:  Negative for sore throat.   Respiratory: Negative.  Negative for cough, hemoptysis and shortness of breath.   Cardiovascular: Negative.  Negative for chest pain, palpitations and leg swelling.  Gastrointestinal:  Negative for abdominal pain, diarrhea, nausea and vomiting.  Genitourinary: Negative.  Negative for dysuria.  Musculoskeletal: Negative.  Negative for back pain and myalgias.  Skin: Negative.  Negative for rash.  Neurological: Negative.  Negative for dizziness, sensory change, weakness and headaches.  Psychiatric/Behavioral: Negative.  Negative for depression. The Kelsey Russo is not nervous/anxious and does not have insomnia.     As per HPI.  Otherwise, a complete review of systems is negative.  PAST MEDICAL HISTORY: Past Medical History:  Diagnosis Date   Anemia    during chemotherapy   Brain tumor (benign) ([place])    Breast cancer ([place]) 1999   Status post right breast lumpectomy and radiation   Breast cancer ([place]) 11/2019   right breast   COPD (chronic obstructive pulmonary disease) ([facility])    Depression    History of chemotherapy 1999   History of seizure disorder    Hypertension    Personal history of chemotherapy    breast cancer 2021   Personal history of radiation therapy 1999   Pneumonia    PONV (postoperative nausea and vomiting)    vomiting    PAST SURGICAL HISTORY: Past Surgical History:  Procedure Laterality Date   BRAIN SURGERY     Brain tumor resection  1991   BREAST BIOPSY Right 1999   Positive   BREAST BIOPSY Right 11/10/2019   [place] bx 10:00 heart marker, positive   BREAST BIOPSY Right 11/10/2019   [place] bx axilla, hydromark 4, positive   BREAST BIOPSY Left 11/10/2019   [place] bx 2:00, venus marker, neg   MASTECTOMY Right 07/13/2020   positive   PORTACATH PLACEMENT Left 11/30/2019   Procedure: INSERTION PORT-A-CATH;  Surgeon: Robert Bellow, MD;  Location: [facility] ORS;  Service: General;  Laterality: Left;   Right breast lumpectomy     SIMPLE MASTECTOMY WITH AXILLARY SENTINEL NODE BIOPSY Right 07/13/2020   Procedure: SIMPLE MASTECTOMY WITH AXILLARY SENTINEL NODE BIOPSY;  Surgeon: Robert Bellow, MD;  Location: [facility] ORS;  Service: General;  Laterality: Right;   TONSILLECTOMY     TUBAL LIGATION      FAMILY HISTORY: Family History  Problem Relation Age of Onset   Depression Mother    CAD Father    Hypertension Father    Lung cancer Father    Coronary artery disease Father 76   Breast cancer Maternal Grandmother     ADVANCED DIRECTIVES (Y/N):  N  HEALTH MAINTENANCE: Social History   Tobacco Use   Smoking status: Every Day    Packs/day: 0.25    Years: 40.00    Total pack years: 10.00     Types: Cigarettes   Smokeless tobacco: Never  Vaping Use   Vaping Use: Never used  Substance Use Topics   Alcohol use: No    Alcohol/week: 0.0 standard drinks of alcohol   Drug use: Yes    Frequency: 3.0 times per week    Types: Marijuana     Colonoscopy:  PAP:  Bone density:  Lipid panel:  Allergies  Allergen Reactions   Imitrex [Sumatriptan] Anaphylaxis   Penicillins Rash    Did it involve swelling of the face/tongue/throat, SOB, or low BP? Unknown Did it involve sudden or severe rash/hives, skin peeling, or any reaction on the inside of your mouth or nose? Unknown Did you need to seek medical attention at a hospital or doctor's office? Unknown When did it last happen? ? If all above answers are "NO", may proceed with cephalosporin use.     Current Outpatient Medications  Medication Sig Dispense Refill   acetaminophen (TYLENOL) 500 MG tablet Take 500-1,000 mg by mouth every 6 (six) hours as needed for moderate pain.      albuterol (VENTOLIN HFA) 108 (90 Base) MCG/ACT inhaler Inhale 1-2 puffs into the lungs every 6 (six) hours as needed for wheezing or shortness of breath.     alendronate (FOSAMAX) 70 MG tablet TAKE 1 TABLET (70 MG) BY MOUTH ONCE A WEEK. TAKE WITH A FULL GLASS OF WATER ONAN EMTPY STOMACH. 4 tablet 3   BREZTRI AEROSPHERE 160-9-4.8 MCG/ACT AERO Inhale into the lungs.     clonazePAM (KLONOPIN) 1 MG tablet Take 1 mg by mouth 3 (three) times daily as needed for anxiety.     losartan (COZAAR) 100 MG tablet Take 100 mg by mouth daily.     sertraline (ZOLOFT) 50 MG tablet Take 50 mg by mouth daily.     tamoxifen (NOLVADEX) 20 MG tablet TAKE 1 TABLET BY MOUTH ONCE A DAY 30 tablet 3   tiZANidine (ZANAFLEX) 2 MG tablet Take by mouth.     traMADol (ULTRAM) 50 MG tablet Take 1 tablet (50 mg total) by mouth 2 (two) times daily. 60 tablet 0   traZODone (DESYREL) 50 MG tablet Take 100 mg by mouth at bedtime as needed.     triamcinolone cream (KENALOG) 0.1 % SMARTSIG:1  Application Topical 2-3 Times Daily     zolpidem (AMBIEN) 10 MG tablet Take 10 mg by mouth at bedtime as needed.     diphenoxylate-atropine (LOMOTIL) 2.5-0.025 MG tablet Take 1 tablet by mouth 4 (four) times daily as needed for diarrhea or loose stools. (Kelsey Russo not taking: Reported on 02/09/2021) 60 tablet 0   fluconazole (DIFLUCAN) 150 MG tablet TAKE 1 TABLET BY MOUTH ONCE FOR 1 DOSE. CAN TAKE ADDITIONAL DOSE 3 DAYS LATER IFSYMPTOMS PERSIST (Kelsey Russo not taking: Reported on 11/23/2021) 2 tablet 0   mirtazapine (REMERON) 15 MG tablet Take 1 tablet (15 mg total) by mouth at bedtime. (Kelsey Russo not taking: Reported on 11/23/2021) 30 tablet 0   nicotine (NICODERM CQ - DOSED IN MG/24 HOURS) 14 mg/24hr patch  Place 14 mg onto the skin daily. (Kelsey Russo not taking: Reported on 02/09/2021)     ondansetron (ZOFRAN) 8 MG tablet TAKE 1 TABLET BY MOUTH 2 TIMES DAILY AS NEEDED (Kelsey Russo not taking: Reported on 11/23/2021) 60 tablet 2   oxyCODONE (OXY IR/ROXICODONE) 5 MG immediate release tablet Take 5 mg by mouth every 6 (six) hours as needed for severe pain. PRN BID (Kelsey Russo not taking: Reported on 11/25/2020)     potassium chloride SA (KLOR-CON) 20 MEQ tablet Take 1 tablet (20 mEq total) by mouth 2 (two) times daily. (Kelsey Russo not taking: Reported on 11/23/2021) 30 tablet 1   pregabalin (LYRICA) 50 MG capsule Take 50 mg by mouth 2 (two) times daily. (Kelsey Russo not taking: Reported on 11/23/2021)     prochlorperazine (COMPAZINE) 10 MG tablet Take 1 tablet (10 mg total) by mouth every 6 (six) hours as needed for nausea or vomiting. (Kelsey Russo not taking: Reported on 11/23/2021) 60 tablet 2   Pseudoeph-Doxylamine-DM-APAP (NYQUIL PO) Take 1-2 capsules by mouth at bedtime as needed (sleep.).      sertraline (ZOLOFT) 25 MG tablet TAKE 1 TABLET BY MOUTH ONCE A DAY (Kelsey Russo not taking: Reported on 02/19/2022) 30 tablet 1   tetrahydrozoline 0.05 % ophthalmic solution Place 1-2 drops into both eyes 3 (three) times daily as needed (dry/irritated  eyes). (Kelsey Russo not taking: Reported on 02/19/2022)     No current facility-administered medications for this visit.   Facility-Administered Medications Ordered in Other Visits  Medication Dose Route Frequency Provider Last Rate Last Admin   0.9 %  sodium chloride infusion   Intravenous Once Lloyd Huger, MD 500 mL/hr at 04/06/20 1025 Rate Verify at 04/06/20 1025   sodium chloride flush (NS) 0.9 % injection 10 mL  10 mL Intravenous PRN Lloyd Huger, MD       sodium chloride flush (NS) 0.9 % injection 10 mL  10 mL Intravenous PRN Lloyd Huger, MD   10 mL at 01/13/20 0851    OBJECTIVE: Vitals:   02/19/22 1332  BP: 104/70  Pulse: 85  Resp: 17  Temp: 97.6 F (36.4 C)     Body mass index is 24.61 kg/m.    ECOG FS:0 - Asymptomatic  General: Well-developed, well-nourished, no acute distress. Eyes: Pink conjunctiva, anicteric sclera. HEENT: Normocephalic, moist mucous membranes. Breast: Exam deferred today. Lungs: No audible wheezing or coughing. Heart: Regular rate and rhythm. Abdomen: Soft, nontender, no obvious distention. Musculoskeletal: No edema, cyanosis, or clubbing. Neuro: Alert, answering all questions appropriately. Cranial nerves grossly intact. Skin: No rashes or petechiae noted. Psych: Normal affect.   LAB RESULTS:  Lab Results  Component Value Date   NA 130 (L) 11/25/2020   K 4.2 11/25/2020   CL 103 11/25/2020   CO2 20 (L) 11/25/2020   GLUCOSE 86 11/25/2020   BUN 8 11/25/2020   CREATININE 0.59 11/25/2020   CALCIUM 8.4 (L) 11/25/2020   PROT 6.3 (L) 11/25/2020   ALBUMIN 3.6 11/25/2020   AST 20 11/25/2020   ALT 18 11/25/2020   ALKPHOS 64 11/25/2020   BILITOT 0.5 11/25/2020   GFRNONAA >60 11/25/2020   GFRAA >60 04/20/2020    Lab Results  Component Value Date   WBC 7.4 11/25/2020   NEUTROABS 4.9 11/25/2020   HGB 14.0 11/25/2020   HCT 41.4 11/25/2020   MCV 88.5 11/25/2020   PLT 386 11/25/2020     STUDIES: No results  found.  ASSESSMENT: Stage pT2, pN0, M0 ER/PR positive, HER-2 negative invasive carcinoma of the  upper outer quadrant right breast. Encounter Diagnoses  Name Primary?   Primary cancer of upper outer quadrant of right female breast ([place]) Yes   Care related to current tamoxifen use    Weight loss    Goals of care, counseling/discussion    Other osteoporosis without current pathological fracture    High risk medication use    Neuropathic pain      PLAN:    1a. 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 She completed 4 cycles of Adriamycin and Cytoxan.  She only received 10 infusions of weekly Taxol her last one occurring on April 06, 2020.  Given her persistently decreased performance status and worsening neuropathy, treatment was discontinued. She underwent simple mastectomy on July 13, 2020 with significant improvement of her disease burden.  She did not require XRT given her simple mastectomy.   Her most recent CT scan on December 05, 2020 did not reveal any evidence of recurrent or progressive disease.  Letrozole was discontinued secondary to osteoporosis and Kelsey Russo was initiated on tamoxifen which she will require to take for a minimum of 5 years completing treatment in December 2026. Given her high risk disease, could possibly extend treatment for 7 to 10 years.  Kelsey Russo's most recent unilateral left screening mammogram on December 02, 2020 was reported as BI-RADS 1. TODAY our plan is that she will continue her Tamoxifen and she will have repeat mammogram in 1 year from last as well as her updated DEXA in March 2024. Follow up in 6 months or sooner if needed.  1b.  History of DCIS: Status post lumpectomy and XRT.  Kelsey Russo reports she did not take any adjuvant hormonal therapy. Previously referred to genetics.  3.  Depression: Chronic in nature. I discussed my  concerns with Kelsey Russo regarding her dose of clonazepam as well as other sedating medications that are on her medication list. Her PCP may want to wean her down on her clonazepam and on to a medication such as Cymbalta to reduce adverse outcomes while improving pain.  4.  Osteoporosis: Chronic- Per Dr. Gary Fleet last note "Kelsey Russo's most recent bone mineral density on November 21, 2021 reported T score of -2.7 which is essentially unchanged from December 2021.  Continue Fosamax, calcium, and vitamin D as prescribed.  Repeat bone mineral density in March 2024."   5. Weight Loss: Chronic. Up 3 pounds. Continue Boost shakes 6. Neuropathic pain: Chronic. We are seeing if she would be a good candidate for clinical trials. Expressed my concerns with Kelsey Russo regarding her medications. She will discuss with PCP at next follow up a plan for further safety in addition to our discussion today. Discussed why additional pain modifying medications would not be appropriate at this time and that a referral to pain management is highly recommended. Pt declines despite advisement.   Kelsey Russo expressed understanding and was in agreement with this plan. She also understands that She can call clinic at any time with any questions, concerns, or complaints.    Cancer Staging  Primary cancer of upper outer quadrant of right female breast [facility]) Staging form: Breast, AJCC 8th Edition - Clinical stage from 11/26/2019: Stage IIIA (cT2, cN2a, cM0, G3, ER+, PR+, HER2-) - Signed by Lloyd Huger, MD on 11/26/2019 Stage prefix: Initial diagnosis Histologic grading system: 3 grade system   Kelsey Russo Closs, PA-C   02/19/2022 2:20 PM

## 2022-02-19 NOTE — Progress Notes (Signed)
Follow-up breast cancer. Reports numbness and tingling in feet and states that pcp has tried gabapentin and Lyrica, but reports that neither have worked. She also reports "soreness" to right axillary area.

## 2022-02-20 ENCOUNTER — Encounter: Payer: Self-pay | Admitting: Oncology

## 2022-03-01 ENCOUNTER — Telehealth: Payer: Self-pay

## 2022-03-01 NOTE — Telephone Encounter (Signed)
Call placed to patient to inquire if she is interested in participating in the [place]-2102 protocol after review of the consent / [facility] at home. Message left for patient to return call with her decision either way.  Jeral Fruit, RN 10:05 AM 03/01/22

## 2022-04-11 ENCOUNTER — Other Ambulatory Visit: Payer: Self-pay | Admitting: Oncology

## 2022-04-30 ENCOUNTER — Telehealth: Payer: Self-pay | Admitting: Medical Oncology

## 2022-04-30 NOTE — Telephone Encounter (Signed)
Left message asking for him to return my call regarding a mutual patient

## 2022-05-28 ENCOUNTER — Ambulatory Visit: Payer: Medicaid Other | Admitting: Medical Oncology

## 2022-06-18 ENCOUNTER — Telehealth: Payer: Self-pay | Admitting: *Deleted

## 2022-06-18 NOTE — Telephone Encounter (Signed)
Call reurned to patient and informed that per Kelsey Russo, RD that she will leave a case of Boost at front desk for patient tomorrow. I asked that she call to be sure it is there before she come to get it.

## 2022-06-18 NOTE — Telephone Encounter (Signed)
Patient called asking about an alternative for her boost that she drinks twice a day She states the company she gets it from said they cannot get any to her until 07/16/22 as it is on back order. I told her an alternative would be Ensure, but she said she would need a new order for it and she did not want to change it for a month then have to change it back. Can we give her a month supply or do we have that much on hand?

## 2022-06-19 NOTE — Progress Notes (Signed)
Nutrition  Complimentary case of ensure enlive left at registration desk for patient to pick up along with coupons.   Sameka Bagent B. Zenia Resides, [place], [place] Registered Dietitian [phone]

## 2022-06-25 ENCOUNTER — Encounter: Payer: Self-pay | Admitting: *Deleted

## 2022-06-25 NOTE — Progress Notes (Signed)
Received a call from Kelsey Russo asking about medicaid recertification.  I returned her call and let her know that the re-cert was handled automatically and she did not have to worry about it getting done.

## 2022-07-13 ENCOUNTER — Telehealth: Payer: Self-pay

## 2022-07-13 NOTE — Telephone Encounter (Signed)
Nutrition  Received patient message regarding oral nutrition supplements on back order.  Called [employer] representative and both boost and ensure are on back order at this time until 11/24.   RD sent email to another company, [employer] regarding oral nutrition supplements and back order status but have not heard back at this time. Currently it is not uncommon for DMEs to have oral nutrition supplements and tube feeding on back order due to various reasons.  Called patient back and let her know that [employer] is unable to send either ensure or boost because they do not have it in stock.  Let patient know that RD has emailed another company regarding availability of stock but have not heard back.  Discussed that many companies over the last 18 months have had issues with keeping products in stock due to various reasons.    RD left a case of ensure enlive at front desk for patient to pick up next week.    Kelsey Russo B. Zenia Resides, [place], [place] Registered Dietitian [phone]

## 2022-08-07 ENCOUNTER — Other Ambulatory Visit: Payer: Self-pay

## 2022-08-09 ENCOUNTER — Other Ambulatory Visit: Payer: Self-pay | Admitting: Oncology

## 2022-08-22 ENCOUNTER — Inpatient Hospital Stay: Payer: Medicaid Other | Admitting: Medical Oncology

## 2022-08-30 ENCOUNTER — Encounter: Payer: Self-pay | Admitting: Medical Oncology

## 2022-08-30 ENCOUNTER — Inpatient Hospital Stay: Payer: Medicaid Other | Attending: Oncology | Admitting: Medical Oncology

## 2022-08-30 VITALS — BP 138/98 | HR 98 | Temp 96.9°F | Wt 142.0 lb

## 2022-08-30 DIAGNOSIS — R634 Abnormal weight loss: Secondary | ICD-10-CM

## 2022-08-30 DIAGNOSIS — Z79899 Other long term (current) drug therapy: Secondary | ICD-10-CM | POA: Diagnosis not present

## 2022-08-30 DIAGNOSIS — I10 Essential (primary) hypertension: Secondary | ICD-10-CM | POA: Diagnosis not present

## 2022-08-30 DIAGNOSIS — F419 Anxiety disorder, unspecified: Secondary | ICD-10-CM | POA: Insufficient documentation

## 2022-08-30 DIAGNOSIS — C50411 Malignant neoplasm of upper-outer quadrant of right female breast: Secondary | ICD-10-CM | POA: Insufficient documentation

## 2022-08-30 DIAGNOSIS — Z170 Estrogen receptor positive status [ER+]: Secondary | ICD-10-CM | POA: Insufficient documentation

## 2022-08-30 DIAGNOSIS — Z79810 Long term (current) use of selective estrogen receptor modulators (SERMs): Secondary | ICD-10-CM | POA: Insufficient documentation

## 2022-08-30 DIAGNOSIS — M818 Other osteoporosis without current pathological fracture: Secondary | ICD-10-CM

## 2022-08-30 DIAGNOSIS — Z923 Personal history of irradiation: Secondary | ICD-10-CM | POA: Diagnosis not present

## 2022-08-30 DIAGNOSIS — F32A Depression, unspecified: Secondary | ICD-10-CM | POA: Insufficient documentation

## 2022-08-30 DIAGNOSIS — G629 Polyneuropathy, unspecified: Secondary | ICD-10-CM | POA: Diagnosis not present

## 2022-08-30 DIAGNOSIS — F17210 Nicotine dependence, cigarettes, uncomplicated: Secondary | ICD-10-CM | POA: Diagnosis not present

## 2022-08-30 NOTE — Progress Notes (Signed)
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  INTERVAL HISTORY: Kelsey Russo return to clinic today for routine 77-monthevaluation. She reports that she has had a tough week. Her husband has stage 4 lung cancer and is currently in the hospital for sepsis. She reports that she is his main caregiver and is very worried about him. She has not been eating or drinking much. She reports increased anxiety compared to baseline- currently on zoloft and clonazepam which is managed by her PCP. In terms of her breast cancer history, she denies any new lumps, bumps, discharge, pain of breast or axilla. Tolerating her Tamoxifen well with mild chronic night sweats. Last mammogram was on 01/11/2022 which was benign appearing. Last DEXA was on 11/21/2021.   Wt Readings from Last 3 Encounters:  08/30/22 142 lb (64.4 kg)  02/19/22 152 lb 8 oz (69.2 kg)  11/23/21 149 lb 8 oz (67.8 kg)    REVIEW OF SYSTEMS:   Review of Systems  Constitutional: Negative.  Negative for fever, malaise/fatigue and weight loss.  HENT:  Negative for sore throat.   Respiratory: Negative.  Negative for cough, hemoptysis and shortness of breath.   Cardiovascular: Negative.  Negative for chest pain, palpitations and leg swelling.  Gastrointestinal:  Negative for abdominal pain, diarrhea, nausea  and vomiting.  Genitourinary: Negative.  Negative for dysuria.  Musculoskeletal: Negative.  Negative for back pain and myalgias.  Skin: Negative.  Negative for rash.  Neurological: Negative.  Negative for dizziness, sensory change, weakness and headaches.  Psychiatric/Behavioral: Negative.  Negative for depression. The patient is not nervous/anxious and does not have insomnia.     As per HPI. Otherwise, a complete review of systems is negative.  PAST MEDICAL HISTORY: Past Medical History:  Diagnosis Date   Anemia    during chemotherapy   Brain tumor (benign) ([facility]    Breast cancer ([facility] 1999   Status post right breast lumpectomy and radiation   [place] 11[place]   right breast   COPD (chronic obstructive pulmonary disease) (HCC)    Depression    History of chemotherapy 1999   History of seizure disorder    Hypertension    Personal history of chemotherapy    breast cancer 2021   Personal history of radiation therapy 1999   Pneumonia    PONV (postoperative nausea and vomiting)    vomiting    PAST SURGICAL HISTORY: Past Surgical History:  Procedure Laterality Date   BRAIN SURGERY     Brain tumor resection  1991   BREAST BIOPSY Right 1999   Positive   BREAST BIOPSY Right 11/10/2019   uKoreabx 10:00 heart marker, positive   BREAST BIOPSY Right 11/10/2019   uKoreabx axilla, hydromark 4, positive   BREAST BIOPSY Left 11/10/2019   uKoreabx 2:00, venus marker, neg   MASTECTOMY Right 07/13/2020   positive   PORTACATH PLACEMENT Left 11/30/2019   Procedure: INSERTION PORT-A-CATH;  Surgeon: Robert Bellow, MD;  Location: [facility] ORS;  Service: General;  Laterality: Left;   Right breast lumpectomy     SIMPLE MASTECTOMY WITH AXILLARY SENTINEL NODE BIOPSY Right 07/13/2020   Procedure: SIMPLE MASTECTOMY WITH AXILLARY SENTINEL NODE BIOPSY;  Surgeon: Robert Bellow, MD;  Location: [facility] ORS;  Service: General;  Laterality: Right;   TONSILLECTOMY     TUBAL LIGATION       FAMILY HISTORY: Family History  Problem Relation Age of Onset   Depression Mother    CAD Father    Hypertension Father    Lung cancer Father    Coronary artery disease Father 34   Breast cancer Maternal Grandmother     ADVANCED DIRECTIVES (Y/N):  N  HEALTH MAINTENANCE: Social History   Tobacco Use   Smoking status: Every Day    Packs/day: 0.25    Years: 40.00    Total pack years: 10.00    Types: Cigarettes   Smokeless tobacco: Never  Vaping Use   Vaping Use: Never used  Substance Use Topics   Alcohol use: No    Alcohol/week: 0.0 standard drinks of alcohol   Drug use: Yes    Frequency: 3.0 times per week    Types: Marijuana     Colonoscopy:  PAP:  Bone density:  Lipid panel:  Allergies  Allergen Reactions   Imitrex [Sumatriptan] Anaphylaxis   Penicillins Rash    Did it involve swelling of the face/tongue/throat, SOB, or low BP? Unknown Did it involve sudden or severe rash/hives, skin peeling, or any reaction on the inside of your mouth or nose? Unknown Did you need to seek medical attention at a hospital or doctor's office? Unknown When did it last happen? ? If all above answers are "NO", may proceed with cephalosporin use.     Current Outpatient Medications  Medication Sig Dispense Refill   acetaminophen (TYLENOL) 500 MG tablet Take 500-1,000 mg by mouth every 6 (six) hours as needed for moderate pain.      albuterol (VENTOLIN HFA) 108 (90 Base) MCG/ACT inhaler Inhale 1-2 puffs into the lungs every 6 (six) hours as needed for wheezing or shortness of breath.     alendronate (FOSAMAX) 70 MG tablet TAKE 1 TABLET (70 MG) BY MOUTH ONCE A WEEK. TAKE WITH A FULL GLASS OF WATER ONAN EMTPY STOMACH. 4 tablet 3   BREZTRI AEROSPHERE 160-9-4.8 MCG/ACT AERO Inhale into the lungs.     clonazePAM (KLONOPIN) 1 MG tablet Take 1 mg by mouth 3 (three) times daily as needed for anxiety.     losartan (COZAAR) 100 MG tablet Take 100 mg by mouth daily.      Pseudoeph-Doxylamine-DM-APAP (NYQUIL PO) Take 1-2 capsules by mouth at bedtime as needed (sleep.).      sertraline (ZOLOFT) 50 MG tablet Take 50 mg by mouth daily.     tamoxifen (NOLVADEX) 20 MG tablet TAKE 1 TABLET BY MOUTH ONCE A DAY 30 tablet 3   tiZANidine (ZANAFLEX) 2 MG tablet Take by mouth.     traMADol (ULTRAM) 50 MG tablet Take 1 tablet (50 mg total) by mouth 2 (two) times daily. 60 tablet 0   traZODone (DESYREL) 50 MG tablet Take 100 mg by mouth at bedtime as needed.     triamcinolone cream (KENALOG) 0.1 % SMARTSIG:1 Application Topical 2-3 Times Daily     zolpidem (AMBIEN) 10 MG tablet Take 10 mg by mouth at bedtime as needed.     sertraline (ZOLOFT) 25 MG tablet TAKE 1  TABLET BY MOUTH ONCE A DAY (Patient not taking: Reported on 02/19/2022) 30 tablet 1   tetrahydrozoline 0.05 % ophthalmic solution Place 1-2 drops into both eyes 3 (three) times daily as needed (dry/irritated eyes). (Patient not taking: Reported on 02/19/2022)     No current facility-administered medications for this visit.   Facility-Administered Medications Ordered in Other Visits  Medication Dose Route Frequency Provider Last Rate Last Admin   0.9 %  sodium chloride infusion   Intravenous Once Lloyd Huger, MD 500 mL/hr at 04/06/20 1025 Rate Verify at 04/06/20 1025   sodium chloride flush (NS) 0.9 % injection 10 mL  10 mL Intravenous PRN Lloyd Huger, MD       sodium chloride flush (NS) 0.9 % injection 10 mL  10 mL Intravenous PRN Lloyd Huger, MD   10 mL at 01/13/20 0851    OBJECTIVE: Vitals:   08/30/22 0958  BP: (!) 138/98  Pulse: 98  Temp: (!) 96.9 F (36.1 C)  SpO2: 100%     Body mass index is 22.92 kg/m.    ECOG FS:0 - Asymptomatic  General: Well-developed, well-nourished, no acute distress. Eyes: Pink conjunctiva, anicteric sclera. HEENT: Normocephalic, moist mucous membranes. Breast: Well healed mastectomy scar of the right breast without any tenderness, masses of the chest  wall. The left breast is without any nipple discharge, lumps/bumps or axillary adenopathy.  Lungs: No audible wheezing or coughing. Heart: Regular rate and rhythm. Abdomen: Soft, nontender, no obvious distention. Musculoskeletal: No edema, cyanosis, or clubbing. Neuro: Alert, answering all questions appropriately. Cranial nerves grossly intact. Skin: No rashes or petechiae noted. Psych: Normal affect.   LAB RESULTS:  Lab Results  Component Value Date   NA 130 (L) 11/25/2020   K 4.2 11/25/2020   CL 103 11/25/2020   CO2 20 (L) 11/25/2020   GLUCOSE 86 11/25/2020   BUN 8 11/25/2020   CREATININE 0.59 11/25/2020   CALCIUM 8.4 (L) 11/25/2020   PROT 6.3 (L) 11/25/2020   ALBUMIN 3.6 11/25/2020   AST 20 11/25/2020   ALT 18 11/25/2020   ALKPHOS 64 11/25/2020   BILITOT 0.5 11/25/2020   GFRNONAA >60 11/25/2020   GFRAA >60 04/20/2020    Lab Results  Component Value Date   WBC 7.4 11/25/2020   NEUTROABS 4.9 11/25/2020   HGB 14.0 11/25/2020   HCT 41.4 11/25/2020   MCV 88.5 11/25/2020   PLT 386 11/25/2020     STUDIES: No results found.  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 Encounter Diagnoses  Name Primary?   Primary cancer of upper outer quadrant of right female breast ([place]) Yes   Care related to current tamoxifen use    Weight loss    Other osteoporosis without current pathological fracture       PLAN:    1a. 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 She completed 4 cycles of Adriamycin and Cytoxan.  She only received 10 infusions of weekly Taxol her last one occurring on April 06, 2020.  Given her persistently decreased performance status and worsening neuropathy, treatment was discontinued. She underwent simple mastectomy on July 13, 2020 with  significant improvement of her disease burden.  She did not require XRT given her simple mastectomy.   Her most recent CT scan on December 05, 2020 did not reveal any evidence  of recurrent or progressive disease.  Letrozole was discontinued secondary to osteoporosis and patient was initiated on tamoxifen which she will require to take for a minimum of 5 years completing treatment in December 2026. Given her high risk disease, could possibly extend treatment for 7 to 10 years.  Patient's most recent unilateral left screening mammogram on December 02, 2020 was reported as BI-RADS 1. Today I have advised her to continue her Tamoxifen (Dec 2026 5 year completion date). She will be due for her mammogram in May 2024 which we discussed.    2/3.  Depression/anxiety: Acute on chronic. I have suggested she contact her PCP who manages these conditions and medications to see if any adjustments are suggested.  3..  Osteoporosis: Chronic- Per Dr. Gary Fleet last note "Patient's most recent bone mineral density on November 21, 2021 reported T score of -2.7 which is essentially unchanged from December 2021.  Continue Fosamax, calcium, and vitamin D as prescribed.  Repeat bone mineral density in March 2024."  No change to this plan at this time- reminded patient of this scan.  5. Weight Loss: Acute on chronic secondary to stress and decreased PO intake. Getting her follow up with nutrition to help get her back on her Boost shakes.  6. Neuropathic pain: Chronic and stable.   Disposition: DEXA March 2024 Mammogram May 2024 Follow up with Nutrition within 2 weeks  RTC 6 months MD   Patient expressed understanding and was in agreement with this plan. She also understands that She can call clinic at any time with any questions, concerns, or complaints.    Cancer Staging  Primary cancer of upper outer quadrant of right female breast [facility]) Staging form: Breast, AJCC 8th Edition - Clinical stage from 11/26/2019: Stage IIIA (cT2, cN2a,  cM0, G3, ER+, PR+, HER2-) - Signed by Lloyd Huger, MD on 11/26/2019 Stage prefix: Initial diagnosis Histologic grading system: 3 grade system   Hughie Closs, PA-C   08/30/2022 10:37 AM

## 2022-08-31 ENCOUNTER — Other Ambulatory Visit: Payer: Self-pay

## 2022-08-31 DIAGNOSIS — C50411 Malignant neoplasm of upper-outer quadrant of right female breast: Secondary | ICD-10-CM

## 2022-08-31 DIAGNOSIS — Z7189 Other specified counseling: Secondary | ICD-10-CM

## 2022-09-07 ENCOUNTER — Telehealth: Payer: Self-pay | Admitting: *Deleted

## 2022-09-07 ENCOUNTER — Inpatient Hospital Stay: Payer: Medicaid Other | Attending: Oncology

## 2022-09-07 NOTE — Progress Notes (Addendum)
Nutrition Follow-up:  Referral due to weight loss and needing more boost shakes  57 year old female with right breast cancer currently on tamoxifen.    Spoke with patient via phone.  Reports that she is under a lot of stress due to her husband having stage 4 lung cancer.  He is currently in the the hospital at [facility].  She is traveling back and forth to see him everyday.  Appetite is poor.  The only thing that she really wants in  peanut M&Ms.  Likes drinking boost shakes.  [employer] has been supplying her with shakes was on back order for some time but now they need authorization to send them again to her.  Knows that she needs to eat to keep her strength up.      Medications: reviewed  Labs: reviewed  Anthropometrics:   Weight 142 lb on 12/28 (last clinic visit)  152 lb 8 oz on 6/19 149 lb 8 oz on 3/23  7% weight loss in 6 months   NUTRITION DIAGNOSIS: Inadequate oral intake related to stress/anxiety as evidenced by 7% weight loss in the last 6 months and poor po intake    INTERVENTION:  Recommend boost plus TID for added calories and protein. Message sent to Provider/Nursing to assist with authorization paperwork sent to [employer] Discussed snacks/foods to take with her to hospital as cafeteria does not take cash and she does not have card.   Encouraged high calorie, high protein foods Patient has contact number    MONITORING, EVALUATION, GOAL: weight trends, intake   NEXT VISIT: as needed  Kelsey Russo, [place], [place] Registered Dietitian [phone]

## 2022-09-07 NOTE — Telephone Encounter (Signed)
[employer] needing authorization criteria for Boost 3 x daily. Faxed MD order, demographics and last provider note proving weight loss and need for supplements. Faxed to [place] [phone]

## 2022-09-11 ENCOUNTER — Telehealth: Payer: Self-pay

## 2022-09-11 NOTE — Telephone Encounter (Signed)
Nutrition  Received message from patient wanting update on boost shakes.   Called patient back and left message that [employer] is waiting on Medicaid approval.    Haani Bakula B. Zenia Resides, [place], [place] Registered Dietitian [phone]

## 2022-09-12 ENCOUNTER — Encounter: Payer: Self-pay | Admitting: Oncology

## 2022-10-09 ENCOUNTER — Telehealth: Payer: Self-pay

## 2022-10-09 ENCOUNTER — Encounter: Payer: Self-pay | Admitting: Oncology

## 2022-10-09 MED ORDER — LIDOCAINE VISCOUS HCL 2 % MT SOLN: 15.0000 mL | Freq: Four times a day (QID) | OROMUCOSAL | 0 refills | Status: DC | PRN

## 2022-10-09 MED ORDER — NYSTATIN 100000 UNIT/ML MT SUSP: 5.0000 mL | Freq: Four times a day (QID) | OROMUCOSAL | 0 refills | Status: AC

## 2022-10-09 MED ORDER — NYSTATIN 100000 UNIT/ML MT SUSP: 10.0000 mL | Freq: Four times a day (QID) | OROMUCOSAL | 0 refills | Status: DC

## 2022-10-09 NOTE — Telephone Encounter (Signed)
Pt called and left vm regarding having a sore throat and has sores on her tongue that makes it hard for her to even talk, asked if you can send rx for her? Please advise

## 2022-10-09 NOTE — Telephone Encounter (Signed)
Pt called back and left vm regarding rx magic mouthwash isn't covered by her insurance, asked if there's something different you can send in? Please advise

## 2022-10-09 NOTE — Telephone Encounter (Signed)
Pt informed

## 2022-10-11 ENCOUNTER — Ambulatory Visit: Payer: Medicaid Other | Admitting: Family

## 2022-10-12 ENCOUNTER — Ambulatory Visit (INDEPENDENT_AMBULATORY_CARE_PROVIDER_SITE_OTHER): Payer: Medicaid Other | Admitting: Family

## 2022-10-12 VITALS — BP 120/78 | HR 100 | Ht 64.0 in | Wt 148.0 lb

## 2022-10-12 DIAGNOSIS — K047 Periapical abscess without sinus: Secondary | ICD-10-CM | POA: Diagnosis not present

## 2022-10-12 DIAGNOSIS — J449 Chronic obstructive pulmonary disease, unspecified: Secondary | ICD-10-CM | POA: Insufficient documentation

## 2022-10-12 DIAGNOSIS — J029 Acute pharyngitis, unspecified: Secondary | ICD-10-CM | POA: Diagnosis not present

## 2022-10-12 MED ORDER — CLINDAMYCIN HCL 300 MG PO CAPS: 300.0000 mg | ORAL_CAPSULE | Freq: Two times a day (BID) | ORAL | 0 refills | Status: AC

## 2022-10-12 MED ORDER — HYDROCODONE-ACETAMINOPHEN 5-325 MG PO TABS: 1.0000 | ORAL_TABLET | Freq: Four times a day (QID) | ORAL | 0 refills | Status: AC | PRN

## 2022-10-12 NOTE — Patient Instructions (Addendum)
[facility]:  If you are in pain or have an urgent need, do not complete this survey. Call Urgent Care at [phone].  [facility] patient registration:  [email or web address]

## 2022-10-12 NOTE — Progress Notes (Unsigned)
   Acute Office Visit  Subjective:     Patient ID: Kelsey Russo, female    DOB: 07-17-1966, 57 y.o.   MRN: [id number]  Chief Complaint  Patient presents with   Follow-up    Sore throat/sores in mouth    HPI Patient is in today for  Chief Complaint  Patient presents with   Follow-up    Sore throat/sores in mouth    Review of Systems  HENT:  Positive for sore throat.        Tooth pain         Objective:    BP 120/78   Pulse 100   Ht '5\' 4"'$  (1.626 m)   Wt 148 lb (67.1 kg)   SpO2 95%   BMI 25.40 kg/m   Physical Exam Vitals and nursing note reviewed.  Constitutional:      Appearance: Normal appearance. She is normal weight.  Neurological:     Mental Status: She is alert.     No results found for any visits on 10/12/22.      Assessment & Plan:   Problem List Items Addressed This Visit   None Visit Diagnoses     Acute pharyngitis, unspecified etiology    -  Primary   Tooth abscess            Meds ordered this encounter  Medications   clindamycin (CLEOCIN) 300 MG capsule    Sig: Take 1 capsule (300 mg total) by mouth every 12 (twelve) hours for 7 days.    Dispense:  14 capsule    Refill:  0   HYDROcodone-acetaminophen (NORCO/VICODIN) 5-325 MG tablet    Sig: Take 1 tablet by mouth every 6 (six) hours as needed for up to 5 days for moderate pain.    Dispense:  20 tablet    Refill:  0    Return as scheduled 11/05/2022.  Total time spent: {AMA time spent:29001} minutes  [place], FNP

## 2022-10-13 ENCOUNTER — Encounter: Payer: Self-pay | Admitting: Family

## 2022-10-25 ENCOUNTER — Other Ambulatory Visit: Payer: Self-pay

## 2022-11-05 ENCOUNTER — Ambulatory Visit: Payer: Medicaid Other | Admitting: Family

## 2022-11-05 ENCOUNTER — Ambulatory Visit: Payer: Medicaid Other | Admitting: Internal Medicine

## 2022-11-06 ENCOUNTER — Other Ambulatory Visit: Payer: Self-pay | Admitting: Family

## 2022-11-08 ENCOUNTER — Other Ambulatory Visit: Payer: Self-pay | Admitting: Family

## 2022-11-08 ENCOUNTER — Other Ambulatory Visit: Payer: Self-pay | Admitting: Oncology

## 2022-11-12 ENCOUNTER — Ambulatory Visit: Payer: Medicaid Other | Admitting: Family

## 2022-11-12 ENCOUNTER — Encounter: Payer: Self-pay | Admitting: Family

## 2022-11-12 VITALS — BP 120/74 | HR 85 | Ht 67.0 in | Wt 146.0 lb

## 2022-11-12 DIAGNOSIS — E559 Vitamin D deficiency, unspecified: Secondary | ICD-10-CM

## 2022-11-12 DIAGNOSIS — E538 Deficiency of other specified B group vitamins: Secondary | ICD-10-CM

## 2022-11-12 DIAGNOSIS — F17209 Nicotine dependence, unspecified, with unspecified nicotine-induced disorders: Secondary | ICD-10-CM

## 2022-11-12 DIAGNOSIS — Z716 Tobacco abuse counseling: Secondary | ICD-10-CM

## 2022-11-12 DIAGNOSIS — E782 Mixed hyperlipidemia: Secondary | ICD-10-CM

## 2022-11-12 DIAGNOSIS — I10 Essential (primary) hypertension: Secondary | ICD-10-CM

## 2022-11-12 DIAGNOSIS — J449 Chronic obstructive pulmonary disease, unspecified: Secondary | ICD-10-CM

## 2022-11-12 MED ORDER — BREZTRI AEROSPHERE 160-9-4.8 MCG/ACT IN AERO: 2.0000 | INHALATION_SPRAY | Freq: Two times a day (BID) | RESPIRATORY_TRACT | 5 refills | Status: DC

## 2022-11-12 MED ORDER — SERTRALINE HCL 100 MG PO TABS: 200.0000 mg | ORAL_TABLET | Freq: Every day | ORAL | 0 refills | Status: DC

## 2022-11-12 MED ORDER — HYDROXYZINE HCL 25 MG PO TABS: 25.0000 mg | ORAL_TABLET | Freq: Three times a day (TID) | ORAL | 3 refills | Status: DC

## 2022-11-12 MED ORDER — VARENICLINE TARTRATE (STARTER) 0.5 MG X 11 & 1 MG X 42 PO TBPK: ORAL_TABLET | ORAL | 0 refills | Status: DC

## 2022-11-12 MED ORDER — VARENICLINE TARTRATE 1 MG PO TABS: 1.0000 mg | ORAL_TABLET | Freq: Two times a day (BID) | ORAL | 2 refills | Status: DC

## 2022-11-12 MED ORDER — TRAZODONE HCL 50 MG PO TABS: ORAL_TABLET | ORAL | 3 refills | Status: DC

## 2022-11-12 NOTE — Progress Notes (Signed)
Established Patient Office Visit  Subjective:  Patient ID: Kelsey Russo, female    DOB: Dec 21, 1965  Age: 57 y.o. MRN: [id number]  Chief Complaint  Patient presents with   Follow-up    3 month follow up    Patient is here for her 3 month f/u.   Since her last visit, she has continued to have issues with her anxiety, given her caregiving situation.  She says that she is doing well otherwise.   Due for labs No other concerns at this time  HM Mammogram         Scheduled - MAMMOGRAM (Every 2 Years) Scheduled for 01/14/2023   01/11/2022  MM 3D SCREEN BREAST UNI LEFT   Only the first 1 history entries have been loaded, but more history  exists.      HM PAP         PAP SMEAR-Modifier (Every 3 Years) Next due on 11/16/2023   11/15/2020  HPV Aptima component of IGP, Aptima HPV   Only the first 1 history entries have been loaded, but more history  exists.        Past Medical History:  Diagnosis Date   Anemia    during chemotherapy   Brain tumor (benign) ([place])    Breast cancer ([place]) 1999   Status post right breast lumpectomy and radiation   Breast cancer ([place]) 11/2019   right breast   COPD (chronic obstructive pulmonary disease) (HCC)    Depression    History of chemotherapy 1999   History of seizure disorder    Hypertension    Personal history of chemotherapy    breast cancer 2021   Personal history of radiation therapy 1999   Pneumonia    PONV (postoperative nausea and vomiting)    vomiting    Past Surgical History:  Procedure Laterality Date   BRAIN SURGERY     Brain tumor resection  1991   BREAST BIOPSY Right 1999   Positive   BREAST BIOPSY Right 11/10/2019   [place] bx 10:00 heart marker, positive   BREAST BIOPSY Right 11/10/2019   [place] bx axilla, hydromark 4, positive   BREAST BIOPSY Left 11/10/2019   [place] bx 2:00, venus marker, neg   MASTECTOMY Right 07/13/2020   positive   PORTACATH PLACEMENT Left 11/30/2019   Procedure: INSERTION PORT-A-CATH;   Surgeon: Robert Bellow, MD;  Location: [facility] ORS;  Service: General;  Laterality: Left;   Right breast lumpectomy     SIMPLE MASTECTOMY WITH AXILLARY SENTINEL NODE BIOPSY Right 07/13/2020   Procedure: SIMPLE MASTECTOMY WITH AXILLARY SENTINEL NODE BIOPSY;  Surgeon: Robert Bellow, MD;  Location: [facility] ORS;  Service: General;  Laterality: Right;   TONSILLECTOMY     TUBAL LIGATION      Social History   Socioeconomic History   Marital status: Married    Spouse name: Not on file   Number of children: Not on file   Years of education: Not on file   Highest education level: Not on file  Occupational History   Not on file  Tobacco Use   Smoking status: Every Day    Packs/day: 0.25    Years: 40.00    Total pack years: 10.00    Types: Cigarettes   Smokeless tobacco: Never  Vaping Use   Vaping Use: Never used  Substance and Sexual Activity   Alcohol use: No    Alcohol/week: 0.0 standard drinks of alcohol   Drug use: Yes    Frequency: 3.0  times per week    Types: Marijuana   Sexual activity: Not on file  Other Topics Concern   Not on file  Social History Narrative   Lives at home with husband.   Social Determinants of Health   Financial Resource Strain: Not on file  Food Insecurity: Not on file  Transportation Needs: Not on file  Physical Activity: Not on file  Stress: Not on file  Social Connections: Not on file  Intimate Partner Violence: Not on file    Family History  Problem Relation Age of Onset   Depression Mother    CAD Father    Hypertension Father    Lung cancer Father    Coronary artery disease Father 50   Breast cancer Maternal Grandmother     Allergies  Allergen Reactions   Imitrex [Sumatriptan] Anaphylaxis   Penicillins Rash    Did it involve swelling of the face/tongue/throat, SOB, or low BP? Unknown Did it involve sudden or severe rash/hives, skin peeling, or any reaction on the inside of your mouth or nose? Unknown Did you need to seek  medical attention at a hospital or doctor's office? Unknown When did it last happen? ? If all above answers are "NO", may proceed with cephalosporin use.     Review of Systems  Constitutional:  Positive for malaise/fatigue.  HENT:  Positive for hearing loss.   Musculoskeletal:  Positive for back pain and neck pain.  Psychiatric/Behavioral:  Positive for depression. The patient is nervous/anxious.   All other systems reviewed and are negative.      Objective:   BP 120/74   Pulse 85   Ht '5\' 7"'$  (1.702 m)   Wt 146 lb (66.2 kg)   SpO2 94%   BMI 22.87 kg/m   Vitals:   11/12/22 1032  BP: 120/74  Pulse: 85  Height: '5\' 7"'$  (1.702 m)  Weight: 146 lb (66.2 kg)  SpO2: 94%  BMI (Calculated): 22.86    Physical Exam Vitals and nursing note reviewed.  Constitutional:      Appearance: Normal appearance. She is normal weight.  HENT:     Head: Normocephalic.  Eyes:     Pupils: Pupils are equal, round, and reactive to light.  Cardiovascular:     Rate and Rhythm: Normal rate.  Pulmonary:     Effort: Pulmonary effort is normal.  Neurological:     General: No focal deficit present.     Mental Status: She is alert and oriented to person, place, and time.  Psychiatric:        Mood and Affect: Mood normal.        Behavior: Behavior normal.        Thought Content: Thought content normal.        Judgment: Judgment normal.      No results found for any visits on 11/12/22.  No results found for this or any previous visit (from the past 2160 hour(s)).    Assessment & Plan:   Problem List Items Addressed This Visit     Essential hypertension - Primary   Relevant Orders   CBC With Differential   CMP14+EGFR   Chronic obstructive pulmonary disease, unspecified COPD type ([place])   Relevant Medications   BREZTRI AEROSPHERE 160-9-4.8 MCG/ACT AERO   Varenicline Tartrate, Starter, 0.5 MG X 11 & 1 MG X 42 TBPK   varenicline (CHANTIX) 1 MG tablet (Start on 12/10/2022)   Other Relevant  Orders   CBC With Differential   CMP14+EGFR  Other Visit Diagnoses     B12 deficiency due to diet       Relevant Orders   Vitamin B12   Vitamin D deficiency, unspecified       Relevant Orders   VITAMIN D 25 Hydroxy (Vit-D Deficiency, Fractures)   Mixed hyperlipidemia       Relevant Orders   Lipid panel   Tobacco use disorder, continuous       Relevant Medications   Varenicline Tartrate, Starter, 0.5 MG X 11 & 1 MG X 42 TBPK   varenicline (CHANTIX) 1 MG tablet (Start on 12/10/2022)   Encounter for smoking cessation counseling           Return in about 3 months (around 02/12/2023) for F/U.   Total time spent: 30 minutes  Mechele Claude, FNP  11/12/2022

## 2022-11-13 LAB — CBC WITH DIFFERENTIAL
Basophils Absolute: 0.1 10*3/uL (ref 0.0–0.2)
Basos: 1 %
EOS (ABSOLUTE): 0.6 10*3/uL — ABNORMAL HIGH (ref 0.0–0.4)
Eos: 6 %
Hematocrit: 45 % (ref 34.0–46.6)
Hemoglobin: 15.3 g/dL (ref 11.1–15.9)
Immature Grans (Abs): 0 10*3/uL (ref 0.0–0.1)
Immature Granulocytes: 0 %
Lymphocytes Absolute: 1.6 10*3/uL (ref 0.7–3.1)
Lymphs: 18 %
MCH: 30.8 pg (ref 26.6–33.0)
MCHC: 34 g/dL (ref 31.5–35.7)
MCV: 91 fL (ref 79–97)
Monocytes Absolute: 0.4 10*3/uL (ref 0.1–0.9)
Monocytes: 5 %
Neutrophils Absolute: 6.5 10*3/uL (ref 1.4–7.0)
Neutrophils: 70 %
RBC: 4.97 x10E6/uL (ref 3.77–5.28)
RDW: 14 % (ref 11.7–15.4)
WBC: 9.2 10*3/uL (ref 3.4–10.8)

## 2022-11-13 LAB — CMP14+EGFR
ALT: 8 IU/L (ref 0–32)
AST: 15 IU/L (ref 0–40)
Albumin/Globulin Ratio: 1.8 (ref 1.2–2.2)
Albumin: 4.4 g/dL (ref 3.8–4.9)
Alkaline Phosphatase: 67 IU/L (ref 44–121)
BUN/Creatinine Ratio: 12 (ref 9–23)
BUN: 11 mg/dL (ref 6–24)
Bilirubin Total: 0.4 mg/dL (ref 0.0–1.2)
CO2: 21 mmol/L (ref 20–29)
Calcium: 9.6 mg/dL (ref 8.7–10.2)
Chloride: 98 mmol/L (ref 96–106)
Creatinine, Ser: 0.94 mg/dL (ref 0.57–1.00)
Globulin, Total: 2.5 g/dL (ref 1.5–4.5)
Glucose: 84 mg/dL (ref 70–99)
Potassium: 4.3 mmol/L (ref 3.5–5.2)
Sodium: 133 mmol/L — ABNORMAL LOW (ref 134–144)
Total Protein: 6.9 g/dL (ref 6.0–8.5)
eGFR: 70 mL/min/{1.73_m2} (ref >59)

## 2022-11-13 LAB — LIPID PANEL
Chol/HDL Ratio: 3 ratio (ref 0.0–4.4)
Cholesterol, Total: 169 mg/dL (ref 100–199)
HDL: 57 mg/dL (ref >39)
LDL Chol Calc (NIH): 97 mg/dL (ref 0–99)
Triglycerides: 80 mg/dL (ref 0–149)
VLDL Cholesterol Cal: 15 mg/dL (ref 5–40)

## 2022-11-13 LAB — VITAMIN D 25 HYDROXY (VIT D DEFICIENCY, FRACTURES): Vit D, 25-Hydroxy: 36.5 ng/mL (ref 30.0–100.0)

## 2022-11-13 LAB — VITAMIN B12: Vitamin B-12: 538 pg/mL (ref 232–1245)

## 2022-11-22 ENCOUNTER — Emergency Department: Payer: Medicaid Other

## 2022-11-22 ENCOUNTER — Other Ambulatory Visit: Payer: Self-pay

## 2022-11-22 ENCOUNTER — Emergency Department
Admission: EM | Admit: 2022-11-22 | Discharge: 2022-11-22 | Disposition: A | Discharge status: Home / Self Care | Payer: Medicaid Other | Attending: Emergency Medicine | Admitting: Emergency Medicine

## 2022-11-22 DIAGNOSIS — E876 Hypokalemia: Secondary | ICD-10-CM | POA: Insufficient documentation

## 2022-11-22 DIAGNOSIS — R52 Pain, unspecified: Secondary | ICD-10-CM

## 2022-11-22 DIAGNOSIS — F419 Anxiety disorder, unspecified: Secondary | ICD-10-CM | POA: Diagnosis not present

## 2022-11-22 DIAGNOSIS — R0789 Other chest pain: Secondary | ICD-10-CM | POA: Diagnosis not present

## 2022-11-22 DIAGNOSIS — Z1152 Encounter for screening for COVID-19: Secondary | ICD-10-CM | POA: Insufficient documentation

## 2022-11-22 DIAGNOSIS — M791 Myalgia, unspecified site: Secondary | ICD-10-CM | POA: Diagnosis not present

## 2022-11-22 LAB — LIPASE, BLOOD: Lipase: 34 U/L (ref 11–51)

## 2022-11-22 LAB — CBC
HCT: 44.2 % (ref 36.0–46.0)
Hemoglobin: 15.3 g/dL — ABNORMAL HIGH (ref 12.0–15.0)
MCH: 30.2 pg (ref 26.0–34.0)
MCHC: 34.6 g/dL (ref 30.0–36.0)
MCV: 87.2 fL (ref 80.0–100.0)
Platelets: 219 10*3/uL (ref 150–400)
RBC: 5.07 MIL/uL (ref 3.87–5.11)
RDW: 14.3 % (ref 11.5–15.5)
WBC: 10.1 10*3/uL (ref 4.0–10.5)
nRBC: 0 % (ref 0.0–0.2)

## 2022-11-22 LAB — BASIC METABOLIC PANEL
Anion gap: 10 (ref 5–15)
BUN: 9 mg/dL (ref 6–20)
CO2: 20 mmol/L — ABNORMAL LOW (ref 22–32)
Calcium: 9.6 mg/dL (ref 8.9–10.3)
Chloride: 108 mmol/L (ref 98–111)
Creatinine, Ser: 0.85 mg/dL (ref 0.44–1.00)
GFR, Estimated: >60 mL/min (ref >60)
Glucose, Bld: 94 mg/dL (ref 70–99)
Potassium: 2.9 mmol/L — ABNORMAL LOW (ref 3.5–5.1)
Sodium: 138 mmol/L (ref 135–145)

## 2022-11-22 LAB — RESP PANEL BY RT-PCR (RSV, FLU A&B, COVID)  RVPGX2
Influenza A by PCR: NEGATIVE
Influenza B by PCR: NEGATIVE
Resp Syncytial Virus by PCR: NEGATIVE
SARS Coronavirus 2 by RT PCR: NEGATIVE

## 2022-11-22 LAB — HEPATIC FUNCTION PANEL
ALT: 11 U/L (ref 0–44)
AST: 19 U/L (ref 15–41)
Albumin: 3.9 g/dL (ref 3.5–5.0)
Alkaline Phosphatase: 51 U/L (ref 38–126)
Bilirubin, Direct: 0.1 mg/dL (ref 0.0–0.2)
Indirect Bilirubin: 0.7 mg/dL (ref 0.3–0.9)
Total Bilirubin: 0.8 mg/dL (ref 0.3–1.2)
Total Protein: 7.2 g/dL (ref 6.5–8.1)

## 2022-11-22 LAB — TROPONIN I (HIGH SENSITIVITY)
Troponin I (High Sensitivity): 3 ng/L (ref <18)
Troponin I (High Sensitivity): 4 ng/L (ref <18)

## 2022-11-22 MED ORDER — POTASSIUM CHLORIDE ER 10 MEQ PO CPCR: 10.0000 meq | ORAL_CAPSULE | Freq: Two times a day (BID) | ORAL | 0 refills | Status: DC

## 2022-11-22 MED ORDER — CLONAZEPAM 0.5 MG PO TABS
0.5000 mg | ORAL_TABLET | Freq: Once | ORAL | Status: AC
  Administered 2022-11-22: 0.5 mg via ORAL
  Filled 2022-11-22: qty 1

## 2022-11-22 MED ORDER — ONDANSETRON HCL 4 MG/2ML IJ SOLN
4.0000 mg | Freq: Once | INTRAMUSCULAR | Status: AC
  Administered 2022-11-22: 4 mg via INTRAVENOUS
  Filled 2022-11-22: qty 2

## 2022-11-22 MED ORDER — IPRATROPIUM-ALBUTEROL 0.5-2.5 (3) MG/3ML IN SOLN
3.0000 mL | Freq: Once | RESPIRATORY_TRACT | Status: AC
  Administered 2022-11-22: 3 mL via RESPIRATORY_TRACT
  Filled 2022-11-22: qty 3

## 2022-11-22 MED ORDER — HYDROXYZINE HCL 25 MG PO TABS: 25.0000 mg | ORAL_TABLET | Freq: Three times a day (TID) | ORAL | 0 refills | Status: DC | PRN

## 2022-11-22 MED ORDER — POTASSIUM CHLORIDE CRYS ER 20 MEQ PO TBCR
40.0000 meq | EXTENDED_RELEASE_TABLET | Freq: Once | ORAL | Status: DC
  Filled 2022-11-22: qty 2

## 2022-11-22 MED ORDER — LORAZEPAM 2 MG/ML IJ SOLN
1.0000 mg | Freq: Once | INTRAMUSCULAR | Status: AC
  Administered 2022-11-22: 1 mg via INTRAVENOUS
  Filled 2022-11-22: qty 1

## 2022-11-22 MED ORDER — POTASSIUM CHLORIDE 20 MEQ PO PACK
40.0000 meq | PACK | Freq: Once | ORAL | Status: AC
  Administered 2022-11-22: 40 meq via ORAL
  Filled 2022-11-22: qty 2

## 2022-11-22 NOTE — ED Provider Notes (Signed)
[facility] Provider Note    Event Date/Time   First MD Initiated Contact with Patient 11/22/22 [phone]     (approximate)   History   Chest Pain and Generalized Body Aches   HPI  Kelsey Russo is a 57 y.o. female  here with chest pain, generalized body aches. Pt states her main issue is that she just woke up and has been very anxious, short of breath, and felt "uneasy" all over. She's had some associated mild chest pressure and SOB. She has felt tingling in her hands and like she can't catch her breath. No cough, sputum production. Of note, pt ran out of her home klonopin about 1 week ago after taking more than was prescribed (h/o same), for her anxiety.       Physical Exam   Triage Vital Signs: ED Triage Vitals  Enc Vitals Group     BP 11/22/22 0854 (!) 157/86     Pulse Rate 11/22/22 0854 72     Resp 11/22/22 0854 18     Temp 11/22/22 0857 97.8 F (36.6 C)     Temp Source 11/22/22 0854 Oral     SpO2 11/22/22 0854 99 %     Weight 11/22/22 0854 145 lb 15.1 oz (66.2 kg)     Height 11/22/22 0854 5\' 7"  (1.702 m)     Head Circumference --      Peak Flow --      Pain Score 11/22/22 0856 6     Pain Loc --      Pain Edu? --      Excl. in [place]? --     Most recent vital signs: Vitals:   11/22/22 1300 11/22/22 1343  BP: 121/78 121/77  Pulse: 75 80  Resp:  18  Temp:  97.9 F (36.6 C)  SpO2: 96% 99%     General: Awake, anxious, nearly tearful. CV:  Good peripheral perfusion. RRR. Resp:  Normal work of breathing. Lungs clear bilaterally, mild tachypnea noted but without retractions, normal aeration, and speaking in full sentences. Abd:  No distention. No tenderness. Other:  No LE edema or swelling. No asymmetry.   ED Results / Procedures / Treatments   Labs (all labs ordered are listed, but only abnormal results are displayed) Labs Reviewed  BASIC METABOLIC PANEL - Abnormal; Notable for the following components:      Result Value    Potassium 2.9 (*)    CO2 20 (*)    All other components within normal limits  CBC - Abnormal; Notable for the following components:   Hemoglobin 15.3 (*)    All other components within normal limits  RESP PANEL BY RT-PCR (RSV, FLU A&B, COVID)  [id number]  HEPATIC FUNCTION PANEL  LIPASE, BLOOD  TROPONIN I (HIGH SENSITIVITY)  TROPONIN I (HIGH SENSITIVITY)     EKG Normal sinus rhythm with PSVCs, HR 96. PR 180,  QRS 72, QTc 530. No acute st elevations.   RADIOLOGY CXR: Clear   I also independently reviewed and agree with radiologist interpretations.   PROCEDURES:  Critical Care performed: No  .1-3 Lead EKG Interpretation  Performed by: Duffy Bruce, MD Authorized by: Duffy Bruce, MD     Interpretation: normal     ECG rate:  60-80   ECG rate assessment: normal     Rhythm: sinus rhythm     Ectopy: none     Conduction: normal   Comments:     Indication: SOB  MEDICATIONS ORDERED IN ED: Medications  LORazepam (ATIVAN) injection 1 mg (1 mg Intravenous Given 11/22/22 0943)  ipratropium-albuterol (DUONEB) 0.5-2.5 (3) MG/3ML nebulizer solution 3 mL (3 mLs Nebulization Given 11/22/22 0943)  clonazePAM (KLONOPIN) tablet 0.5 mg (0.5 mg Oral Given 11/22/22 1156)  potassium chloride (KLOR-CON) packet 40 mEq (40 mEq Oral Given 11/22/22 1154)  ondansetron (ZOFRAN) injection 4 mg (4 mg Intravenous Given 11/22/22 1152)     IMPRESSION / MDM / ASSESSMENT AND PLAN / ED COURSE  I reviewed the triage vital signs and the nursing notes.                              Differential diagnosis includes, but is not limited to, anxiety/panic attack, PNA, PTX, COPD, ACS, arrhythmia, hyperthyroidism, anemia  Patient's presentation is most consistent with acute presentation with potential threat to life or bodily function.  The patient is on the cardiac monitor to evaluate for evidence of arrhythmia and/or significant heart rate changes  57 yo F here with multiple complaints, primarily  SOB, shakiness, tingling in hands, generalized body aches. Pt is HDS and in NAD. 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 She has been on klonopin 1 mg TID for "years" and ran out 1.5 weeks ago, with sx increasing since then. She overuses this per report due to significant stressors, which is why she ran out. No signs of active severe w/d or seizures currently. Labs reassuring. Cbc without leukocytosis. BMP with hypoK likely from poor PO intake - this was repleted. EKG nonischemic and trop neg x 2 - doubt ACS. CXR clear. COVID negative.  Pt feels much better with ativan here. Given that she is already off Klonopin, will hold on reinitiating though I suspect some of her sx are related to recent w/d from this. Encouraged her to fu with PCP.   FINAL CLINICAL IMPRESSION(S) / ED DIAGNOSES   Final diagnoses:  Generalized body aches  Anxiety  Hypokalemia     Rx / DC Orders   ED Discharge Orders          Ordered    potassium chloride (MICRO-K) 10 MEQ CR capsule  2 times daily        11/22/22 1333    hydrOXYzine (ATARAX) 25 MG tablet  3 times daily PRN        11/22/22 1333             Note:  This document was prepared using Dragon voice recognition software and may include unintentional dictation errors.   Duffy Bruce, MD 11/22/22 2213

## 2022-11-22 NOTE — ED Triage Notes (Signed)
Pt here with chest pressure and body aches x2 days. Pt states her arms are tingling and nausea and vomiting. Pt also c/o dizziness. Pt denies fever or fall.

## 2022-11-26 ENCOUNTER — Other Ambulatory Visit: Payer: Self-pay | Admitting: Family

## 2022-11-30 ENCOUNTER — Other Ambulatory Visit: Payer: Self-pay | Admitting: Family

## 2022-12-04 ENCOUNTER — Other Ambulatory Visit: Payer: Self-pay | Admitting: Family

## 2022-12-07 ENCOUNTER — Ambulatory Visit
Admission: RE | Admit: 2022-12-07 | Discharge: 2022-12-07 | Disposition: A | Discharge status: Home / Self Care | Payer: Medicaid Other | Attending: Family | Admitting: Family

## 2022-12-07 ENCOUNTER — Ambulatory Visit: Payer: Medicaid Other | Admitting: Family

## 2022-12-07 ENCOUNTER — Ambulatory Visit
Admission: RE | Admit: 2022-12-07 | Discharge: 2022-12-07 | Disposition: A | Discharge status: Home / Self Care | Payer: Medicaid Other | Source: Ambulatory Visit | Attending: Family | Admitting: Family

## 2022-12-07 VITALS — BP 130/70 | HR 98 | Ht 67.0 in | Wt 144.0 lb

## 2022-12-07 DIAGNOSIS — M542 Cervicalgia: Secondary | ICD-10-CM

## 2022-12-07 DIAGNOSIS — M6281 Muscle weakness (generalized): Secondary | ICD-10-CM | POA: Diagnosis not present

## 2022-12-07 DIAGNOSIS — M545 Low back pain, unspecified: Secondary | ICD-10-CM | POA: Insufficient documentation

## 2022-12-07 MED ORDER — CLINDAMYCIN HCL 300 MG PO CAPS: 300.0000 mg | ORAL_CAPSULE | Freq: Three times a day (TID) | ORAL | 0 refills | Status: AC

## 2022-12-07 NOTE — Progress Notes (Unsigned)
Acute Office Visit  Subjective:     Patient ID: Kelsey Russo, female    DOB: [birth year], 57 y.o.   MRN: [id number]  Chief Complaint  Patient presents with   Back Pain    Back Pain This is a new problem. The current episode started in the past 7 days. The problem occurs constantly. The problem is unchanged. The pain is present in the lumbar spine and thoracic spine. The quality of the pain is described as aching, cramping and stabbing. The pain does not radiate. The pain is at a severity of 7/10. The pain is severe. The pain is The same all the time. The symptoms are aggravated by bending, lying down, twisting and coughing. Stiffness is present All day. Associated symptoms include weakness (in thighs). Risk factors include history of cancer and recent trauma. She has tried analgesics, heat, home exercises, bed rest, muscle relaxant, NSAIDs and walking for the symptoms.     Review of Systems  Musculoskeletal:  Positive for back pain.  Neurological:  Positive for weakness (in thighs).        Objective:    BP 130/70   Pulse 98   Ht 5\' 7"  (1.702 m)   Wt 144 lb (65.3 kg)   SpO2 94%   BMI 22.55 kg/m   Physical Exam Vitals and nursing note reviewed.  Constitutional:      Appearance: Normal appearance. She is normal weight.  HENT:     Head: Normocephalic.  Eyes:     Pupils: Pupils are equal, round, and reactive to light.  Cardiovascular:     Rate and Rhythm: Normal rate.  Pulmonary:     Effort: Pulmonary effort is normal.  Neurological:     Mental Status: She is alert.     Results for orders placed or performed in visit on 12/07/22  CMP14+EGFR  Result Value Ref Range   Glucose 71 70 - 99 mg/dL   BUN 9 6 - 24 mg/dL   Creatinine, Ser 4.09 0.57 - 1.00 mg/dL   eGFR 80 >81 XB/JYN/8.29   BUN/Creatinine Ratio 11 9 - 23   Sodium 133 (L) 134 - 144 mmol/L   Potassium 4.2 3.5 - 5.2 mmol/L   Chloride 95 (L) 96 - 106 mmol/L   CO2 21 20 - 29 mmol/L   Calcium 8.9 8.7 -  10.2 mg/dL   Total Protein 6.0 6.0 - 8.5 g/dL   Albumin 3.6 (L) 3.8 - 4.9 g/dL   Globulin, Total 2.4 1.5 - 4.5 g/dL   Albumin/Globulin Ratio 1.5 1.2 - 2.2   Bilirubin Total 0.3 0.0 - 1.2 mg/dL   Alkaline Phosphatase 70 44 - 121 IU/L   AST 14 0 - 40 IU/L   ALT 13 0 - 32 IU/L  CBC with Diff  Result Value Ref Range   WBC 8.9 3.4 - 10.8 x10E3/uL   RBC 4.17 3.77 - 5.28 x10E6/uL   Hemoglobin 12.6 11.1 - 15.9 g/dL   Hematocrit 56.2 13.0 - 46.6 %   MCV 89 79 - 97 fL   MCH 30.2 26.6 - 33.0 pg   MCHC 34.0 31.5 - 35.7 g/dL   RDW 86.5 78.4 - 69.6 %   Platelets 287 150 - 450 x10E3/uL   Neutrophils 69 Not Estab. %   Lymphs 12 Not Estab. %   Monocytes 9 Not Estab. %   Eos 8 Not Estab. %   Basos 1 Not Estab. %   Neutrophils Absolute 6.2 1.4 - 7.0 x10E3/uL  Lymphocytes Absolute 1.0 0.7 - 3.1 x10E3/uL   Monocytes Absolute 0.8 0.1 - 0.9 x10E3/uL   EOS (ABSOLUTE) 0.7 (H) 0.0 - 0.4 x10E3/uL   Basophils Absolute 0.1 0.0 - 0.2 x10E3/uL   Immature Granulocytes 1 Not Estab. %   Immature Grans (Abs) 0.1 0.0 - 0.1 x10E3/uL        Assessment & Plan:   Problem List Items Addressed This Visit   None Visit Diagnoses     Low back pain at multiple sites    -  Primary   Relevant Orders   DG Lumbar Spine Complete   Muscle weakness       Relevant Orders   CMP14+EGFR (Completed)   CBC with Diff (Completed)   DG Lumbar Spine Complete   Neck pain       Relevant Orders   DG Cervical Spine Complete      Sending patient to [facility] for x-rays today.  Will call with results when they are available.   Return in about 2 weeks (around 12/21/2022) for F/U.  Total time spent: 20 minutes  Miki Kins, FNP  12/07/2022

## 2022-12-08 ENCOUNTER — Encounter: Payer: Self-pay | Admitting: Family

## 2022-12-08 LAB — CBC WITH DIFFERENTIAL/PLATELET
Basophils Absolute: 0.1 10*3/uL (ref 0.0–0.2)
Basos: 1 %
EOS (ABSOLUTE): 0.7 10*3/uL — ABNORMAL HIGH (ref 0.0–0.4)
Eos: 8 %
Hematocrit: 37.1 % (ref 34.0–46.6)
Hemoglobin: 12.6 g/dL (ref 11.1–15.9)
Immature Grans (Abs): 0.1 10*3/uL (ref 0.0–0.1)
Immature Granulocytes: 1 %
Lymphocytes Absolute: 1 10*3/uL (ref 0.7–3.1)
Lymphs: 12 %
MCH: 30.2 pg (ref 26.6–33.0)
MCHC: 34 g/dL (ref 31.5–35.7)
MCV: 89 fL (ref 79–97)
Monocytes Absolute: 0.8 10*3/uL (ref 0.1–0.9)
Monocytes: 9 %
Neutrophils Absolute: 6.2 10*3/uL (ref 1.4–7.0)
Neutrophils: 69 %
Platelets: 287 10*3/uL (ref 150–450)
RBC: 4.17 x10E6/uL (ref 3.77–5.28)
RDW: 13.9 % (ref 11.7–15.4)
WBC: 8.9 10*3/uL (ref 3.4–10.8)

## 2022-12-08 LAB — CMP14+EGFR
ALT: 13 IU/L (ref 0–32)
AST: 14 IU/L (ref 0–40)
Albumin/Globulin Ratio: 1.5 (ref 1.2–2.2)
Albumin: 3.6 g/dL — ABNORMAL LOW (ref 3.8–4.9)
Alkaline Phosphatase: 70 IU/L (ref 44–121)
BUN/Creatinine Ratio: 11 (ref 9–23)
BUN: 9 mg/dL (ref 6–24)
Bilirubin Total: 0.3 mg/dL (ref 0.0–1.2)
CO2: 21 mmol/L (ref 20–29)
Calcium: 8.9 mg/dL (ref 8.7–10.2)
Chloride: 95 mmol/L — ABNORMAL LOW (ref 96–106)
Creatinine, Ser: 0.84 mg/dL (ref 0.57–1.00)
Globulin, Total: 2.4 g/dL (ref 1.5–4.5)
Glucose: 71 mg/dL (ref 70–99)
Potassium: 4.2 mmol/L (ref 3.5–5.2)
Sodium: 133 mmol/L — ABNORMAL LOW (ref 134–144)
Total Protein: 6 g/dL (ref 6.0–8.5)
eGFR: 80 mL/min/{1.73_m2} (ref >59)

## 2022-12-10 ENCOUNTER — Other Ambulatory Visit: Payer: Medicaid Other

## 2022-12-11 ENCOUNTER — Other Ambulatory Visit: Payer: Self-pay | Admitting: Family

## 2022-12-12 MED ORDER — NYSTATIN 100000 UNIT/ML MT SUSP: 5.0000 mL | Freq: Four times a day (QID) | OROMUCOSAL | 0 refills | Status: AC

## 2022-12-12 NOTE — Telephone Encounter (Signed)
-----   Message from Miki Kins, FNP sent at 12/12/2022  1:20 PM EDT ----- Let her know that her sodium was a little bit low, so she should increase her salt intake slightly over the next few days.   Otherwise, good.

## 2022-12-12 NOTE — Telephone Encounter (Signed)
Patient requesting nystatin for her mouth.

## 2022-12-13 ENCOUNTER — Telehealth: Payer: Self-pay

## 2022-12-13 NOTE — Telephone Encounter (Signed)
Kelsey Russo with [facility] pharmacy called and left vm regarding pt's rx nystatin suspension being on back order & asked if you can send alternate rx for pt? Please advise

## 2022-12-14 MED ORDER — CLOTRIMAZOLE 10 MG MT TROC: 10.0000 mg | Freq: Every day | OROMUCOSAL | 0 refills | Status: DC

## 2022-12-14 NOTE — Addendum Note (Signed)
Addended by: Grayling Congress on: 12/14/2022 09:00 AM   Modules accepted: Orders

## 2022-12-18 ENCOUNTER — Other Ambulatory Visit: Payer: Self-pay | Admitting: Family

## 2022-12-21 ENCOUNTER — Encounter: Payer: Self-pay | Admitting: Family

## 2022-12-21 ENCOUNTER — Ambulatory Visit: Payer: Medicaid Other | Admitting: Family

## 2022-12-21 VITALS — BP 128/62 | HR 97 | Ht 67.0 in | Wt 135.4 lb

## 2022-12-21 DIAGNOSIS — F17209 Nicotine dependence, unspecified, with unspecified nicotine-induced disorders: Secondary | ICD-10-CM | POA: Diagnosis not present

## 2022-12-21 DIAGNOSIS — Z636 Dependent relative needing care at home: Secondary | ICD-10-CM | POA: Diagnosis not present

## 2022-12-21 DIAGNOSIS — I10 Essential (primary) hypertension: Secondary | ICD-10-CM

## 2022-12-21 DIAGNOSIS — J449 Chronic obstructive pulmonary disease, unspecified: Secondary | ICD-10-CM | POA: Diagnosis not present

## 2022-12-21 MED ORDER — METRONIDAZOLE 500 MG PO TABS: 500.0000 mg | ORAL_TABLET | Freq: Two times a day (BID) | ORAL | 0 refills | Status: AC

## 2022-12-21 MED ORDER — PROCHLORPERAZINE MALEATE 5 MG PO TABS: 5.0000 mg | ORAL_TABLET | Freq: Four times a day (QID) | ORAL | 0 refills | Status: DC | PRN

## 2022-12-21 NOTE — Progress Notes (Unsigned)
Established Patient Office Visit  Subjective:  Patient ID: Kelsey Russo, female    DOB: [birth year]  Age: 57 y.o. MRN: [id number]  Chief Complaint  Patient presents with   Follow-up    Follow up    Patient is here for her 2 week follow up.  She is doing better, has not had any further episodes of the weakness in her legs.  She is still having pains in her back, but they are much improved.   She did have significant nausea and had to stop taking the antibiotics.   No other concerns at this time.   Past Medical History:  Diagnosis Date   Anemia    during chemotherapy   Brain tumor (benign)    Breast cancer 1999   Status post right breast lumpectomy and radiation   Breast cancer 11/2019   right breast   COPD (chronic obstructive pulmonary disease)    Depression    History of chemotherapy 1999   History of seizure disorder    Hypertension    Personal history of chemotherapy    breast cancer 2021   Personal history of radiation therapy 1999   Pneumonia    PONV (postoperative nausea and vomiting)    vomiting    Past Surgical History:  Procedure Laterality Date   BRAIN SURGERY     Brain tumor resection  1991   BREAST BIOPSY Right 1999   Positive   BREAST BIOPSY Right 11/10/2019   [place] bx 10:00 heart marker, positive   BREAST BIOPSY Right 11/10/2019   [place] bx axilla, hydromark 4, positive   BREAST BIOPSY Left 11/10/2019   [place] bx 2:00, venus marker, neg   MASTECTOMY Right 07/13/2020   positive   PORTACATH PLACEMENT Left 11/30/2019   Procedure: INSERTION PORT-A-CATH;  Surgeon: Earline Mayotte, MD;  Location: [facility];  Service: General;  Laterality: Left;   Right breast lumpectomy     SIMPLE MASTECTOMY WITH AXILLARY SENTINEL NODE BIOPSY Right 07/13/2020   Procedure: SIMPLE MASTECTOMY WITH AXILLARY SENTINEL NODE BIOPSY;  Surgeon: Earline Mayotte, MD;  Location: [facility];  Service: General;  Laterality: Right;   TONSILLECTOMY     TUBAL LIGATION      Social  History   Socioeconomic History   Marital status: Married    Spouse name: Not on file   Number of children: Not on file   Years of education: Not on file   Highest education level: Not on file  Occupational History   Not on file  Tobacco Use   Smoking status: Every Day    Packs/day: 0.25    Years: 40.00    Additional pack years: 0.00    Total pack years: 10.00    Types: Cigarettes   Smokeless tobacco: Never  Vaping Use   Vaping Use: Never used  Substance and Sexual Activity   Alcohol use: No    Alcohol/week: 0.0 standard drinks of alcohol   Drug use: Yes    Frequency: 3.0 times per week    Types: Marijuana   Sexual activity: Not on file  Other Topics Concern   Not on file  Social History Narrative   Lives at home with husband.   Social Determinants of Health   Financial Resource Strain: Not on file  Food Insecurity: Not on file  Transportation Needs: Not on file  Physical Activity: Not on file  Stress: Not on file  Social Connections: Not on file  Intimate Partner Violence: Not on file  Family History  Problem Relation Age of Onset   Depression Mother    CAD Father    Hypertension Father    Lung cancer Father    Coronary artery disease Father 35   Breast cancer Maternal Grandmother     Allergies  Allergen Reactions   Imitrex [Sumatriptan] Anaphylaxis   Penicillins Rash    Did it involve swelling of the face/tongue/throat, SOB, or low BP? Unknown Did it involve sudden or severe rash/hives, skin peeling, or any reaction on the inside of your mouth or nose? Unknown Did you need to seek medical attention at a hospital or doctor's office? Unknown When did it last happen? ? If all above answers are "NO", may proceed with cephalosporin use.     Review of Systems  Musculoskeletal:  Positive for back pain.  Psychiatric/Behavioral:  The patient is nervous/anxious and has insomnia.   All other systems reviewed and are negative.      Objective:   BP  128/62   Pulse 97   Ht  (1.702 m)   Wt 135 lb 6.4 oz (61.4 kg)   SpO2 96%   BMI 21.21 kg/m   Vitals:   12/21/22 1037  BP: 128/62  Pulse: 97  Height:  (1.702 m)  Weight: 135 lb 6.4 oz (61.4 kg)  SpO2: 96%  BMI (Calculated): 21.2    Physical Exam Vitals and nursing note reviewed.  Constitutional:      Appearance: Normal appearance. She is normal weight.  HENT:     Head: Normocephalic and atraumatic.  Eyes:     Extraocular Movements: Extraocular movements intact.     Conjunctiva/sclera: Conjunctivae normal.     Pupils: Pupils are equal, round, and reactive to light.  Cardiovascular:     Rate and Rhythm: Normal rate.  Pulmonary:     Effort: Pulmonary effort is normal.  Neurological:     General: No focal deficit present.     Mental Status: She is alert and oriented to person, place, and time.  Psychiatric:        Attention and Perception: Attention normal.        Mood and Affect: Mood is anxious and depressed.        Behavior: Behavior normal.        Thought Content: Thought content normal.        Judgment: Judgment normal.      No results found for any visits on 12/21/22.  Recent Results (from the past 2160 hour(s))  Lipid panel     Status: None   Collection Time: 11/12/22 11:24 AM  Result Value Ref Range   Cholesterol, Total 169 100 - 199 mg/dL   Triglycerides 80 0 - 149 mg/dL   HDL 57 >18 mg/dL   VLDL Cholesterol Cal 15 5 - 40 mg/dL   LDL Chol Calc (NIH) 97 0 - 99 mg/dL   Chol/HDL Ratio 3.0 0.0 - 4.4 ratio    Comment:                                   T. Chol/HDL Ratio                                             Men  Women  1/2 Avg.Risk  3.4    3.3                                   Avg.Risk  5.0    4.4                                2X Avg.Risk  9.6    7.1                                3X Avg.Risk 23.4   11.0   VITAMIN D 25 Hydroxy (Vit-D Deficiency, Fractures)     Status: None   Collection Time: 11/12/22 11:24 AM   Result Value Ref Range   Vit D, 25-Hydroxy 36.5 30.0 - 100.0 ng/mL    Comment: Vitamin D deficiency has been defined by the Institute of Medicine and an Endocrine Society practice guideline as a level of serum 25-OH vitamin D less than 20 ng/mL (1,2). The Endocrine Society went on to further define vitamin D insufficiency as a level between 21 and 29 ng/mL (2). 1. IOM (Institute of Medicine). 2010. Dietary reference    intakes for calcium and D. [place]: The    [employer]. 2. Holick MF, Binkley Bland, Bischoff-Ferrari HA, et al.    Evaluation, treatment, and prevention of vitamin D    deficiency: an Endocrine Society clinical practice    guideline. JCEM. 2011 Jul; 96(7):1911-30.   CBC With Differential     Status: Abnormal   Collection Time: 11/12/22 11:24 AM  Result Value Ref Range   WBC 9.2 3.4 - 10.8 x10E3/uL   RBC 4.97 3.77 - 5.28 x10E6/uL   Hemoglobin 15.3 11.1 - 15.9 g/dL   Hematocrit 16.1 09.6 - 46.6 %   MCV 91 79 - 97 fL   MCH 30.8 26.6 - 33.0 pg   MCHC 34.0 31.5 - 35.7 g/dL   RDW 04.5 40.9 - 81.1 %   Neutrophils 70 Not Estab. %   Lymphs 18 Not Estab. %   Monocytes 5 Not Estab. %   Eos 6 Not Estab. %   Basos 1 Not Estab. %   Neutrophils Absolute 6.5 1.4 - 7.0 x10E3/uL   Lymphocytes Absolute 1.6 0.7 - 3.1 x10E3/uL   Monocytes Absolute 0.4 0.1 - 0.9 x10E3/uL   EOS (ABSOLUTE) 0.6 (H) 0.0 - 0.4 x10E3/uL   Basophils Absolute 0.1 0.0 - 0.2 x10E3/uL   Immature Granulocytes 0 Not Estab. %   Immature Grans (Abs) 0.0 0.0 - 0.1 x10E3/uL  CMP14+EGFR     Status: Abnormal   Collection Time: 11/12/22 11:24 AM  Result Value Ref Range   Glucose 84 70 - 99 mg/dL   BUN 11 6 - 24 mg/dL   Creatinine, Ser 9.14 0.57 - 1.00 mg/dL   eGFR 70 >78 GN/FAO/1.30   BUN/Creatinine Ratio 12 9 - 23   Sodium 133 (L) 134 - 144 mmol/L   Potassium 4.3 3.5 - 5.2 mmol/L   Chloride 98 96 - 106 mmol/L   CO2 21 20 - 29 mmol/L   Calcium 9.6 8.7 - 10.2 mg/dL   Total Protein 6.9 6.0 -  8.5 g/dL   Albumin 4.4 3.8 - 4.9 g/dL   Globulin, Total 2.5 1.5 - 4.5 g/dL   Albumin/Globulin Ratio 1.8 1.2 - 2.2  Bilirubin Total 0.4 0.0 - 1.2 mg/dL   Alkaline Phosphatase 67 44 - 121 IU/L   AST 15 0 - 40 IU/L   ALT 8 0 - 32 IU/L  Vitamin B12     Status: None   Collection Time: 11/12/22 11:24 AM  Result Value Ref Range   Vitamin B-12 538 232 - 1,245 pg/mL  Basic metabolic panel     Status: Abnormal   Collection Time: 11/22/22  9:16 AM  Result Value Ref Range   Sodium 138 135 - 145 mmol/L   Potassium 2.9 (L) 3.5 - 5.1 mmol/L   Chloride 108 98 - 111 mmol/L   CO2 20 (L) 22 - 32 mmol/L   Glucose, Bld 94 70 - 99 mg/dL    Comment: Glucose reference range applies only to samples taken after fasting for at least 8 hours.   BUN 9 6 - 20 mg/dL   Creatinine, Ser 6.94 0.44 - 1.00 mg/dL   Calcium 9.6 8.9 - 85.4 mg/dL   GFR, Estimated >62 >70 mL/min    Comment: (NOTE) Calculated using the CKD-EPI Creatinine Equation (2021)    Anion gap 10 5 - 15    Comment: Performed at [facility], [address] [place], [place] [address]  CBC     Status: Abnormal   Collection Time: 11/22/22  9:16 AM  Result Value Ref Range   WBC 10.1 4.0 - 10.5 K/uL   RBC 5.07 3.87 - 5.11 MIL/uL   Hemoglobin 15.3 (H) 12.0 - 15.0 g/dL   HCT 38.1 82.9 - 93.7 %   MCV 87.2 80.0 - 100.0 fL   MCH 30.2 26.0 - 34.0 pg   MCHC 34.6 30.0 - 36.0 g/dL   RDW 16.9 67.8 - 93.8 %   Platelets 219 150 - 400 K/uL   nRBC 0.0 0.0 - 0.2 %    Comment: Performed at [facility], [address] [place], [place] [address]  Troponin I (High Sensitivity)     Status: None   Collection Time: 11/22/22  9:16 AM  Result Value Ref Range   Troponin I (High Sensitivity) 3 <18 ng/L    Comment: (NOTE) Elevated high sensitivity troponin I (hsTnI) values and significant  changes across serial measurements may suggest ACS but many other  chronic and acute conditions are known to elevate hsTnI results.  Refer to the "Links"  section for chest pain algorithms and additional  guidance. Performed at [facility], [address] [place], [place] [address]   Resp panel by RT-PCR (RSV, Flu A&B, Covid) Anterior Nasal Swab     Status: None   Collection Time: 11/22/22  9:16 AM   Specimen: Anterior Nasal Swab  Result Value Ref Range   SARS Coronavirus 2 by RT PCR NEGATIVE NEGATIVE    Comment: (NOTE) SARS-CoV-2 target nucleic acids are NOT DETECTED.  The SARS-CoV-2 RNA is generally detectable in upper respiratory specimens during the acute phase of infection. The lowest concentration of SARS-CoV-2 viral copies this assay can detect is 138 copies/mL. A negative result does not preclude SARS-Cov-2 infection and should not be used as the sole basis for treatment or other patient management decisions. A negative result may occur with  improper specimen collection/handling, submission of specimen other than nasopharyngeal swab, presence of viral mutation(s) within the areas targeted by this assay, and inadequate number of viral copies(<138 copies/mL). A negative result must be combined with clinical observations, patient history, and epidemiological information. The expected result is Negative.  Fact Sheet for Patients:  BloggerCourse.com  Fact Sheet for Healthcare Providers:  SeriousBroker.it  This test is no t yet approved or cleared by the [place] FDA and  has been authorized for detection and/or diagnosis of SARS-CoV-2 by FDA under an Emergency Use Authorization (EUA). This EUA will remain  in effect (meaning this test can be used) for the duration of the COVID-19 declaration under Section 564(b)(1) of the Act, 21 U.S.C.section 360bbb-3(b)(1), unless the authorization is terminated  or revoked sooner.       Influenza A by PCR NEGATIVE NEGATIVE   Influenza B by PCR NEGATIVE NEGATIVE    Comment: (NOTE) The Xpert Xpress SARS-CoV-2/FLU/RSV  plus assay is intended as an aid in the diagnosis of influenza from Nasopharyngeal swab specimens and should not be used as a sole basis for treatment. Nasal washings and aspirates are unacceptable for Xpert Xpress SARS-CoV-2/FLU/RSV testing.  Fact Sheet for Patients: BloggerCourse.com  Fact Sheet for Healthcare Providers: SeriousBroker.it  This test is not yet approved or cleared by the [place] FDA and has been authorized for detection and/or diagnosis of SARS-CoV-2 by FDA under an Emergency Use Authorization (EUA). This EUA will remain in effect (meaning this test can be used) for the duration of the COVID-19 declaration under Section 564(b)(1) of the Act, 21 U.S.C. section 360bbb-3(b)(1), unless the authorization is terminated or revoked.     Resp Syncytial Virus by PCR NEGATIVE NEGATIVE    Comment: (NOTE) Fact Sheet for Patients: BloggerCourse.com  Fact Sheet for Healthcare Providers: SeriousBroker.it  This test is not yet approved or cleared by the [place] FDA and has been authorized for detection and/or diagnosis of SARS-CoV-2 by FDA under an Emergency Use Authorization (EUA). This EUA will remain in effect (meaning this test can be used) for the duration of the COVID-19 declaration under Section 564(b)(1) of the Act, 21 U.S.C. section 360bbb-3(b)(1), unless the authorization is terminated or revoked.  Performed at [facility], [address] [place], [place] [address]   Hepatic function panel     Status: None   Collection Time: 11/22/22  9:16 AM  Result Value Ref Range   Total Protein 7.2 6.5 - 8.1 g/dL   Albumin 3.9 3.5 - 5.0 g/dL   AST 19 15 - 41 U/L   ALT 11 0 - 44 U/L   Alkaline Phosphatase 51 38 - 126 U/L   Total Bilirubin 0.8 0.3 - 1.2 mg/dL   Bilirubin, Direct 0.1 0.0 - 0.2 mg/dL   Indirect Bilirubin 0.7 0.3 - 0.9 mg/dL    Comment:  Performed at [facility], [address] [place], [place] [address]  Lipase, blood     Status: None   Collection Time: 11/22/22  9:16 AM  Result Value Ref Range   Lipase 34 11 - 51 U/L    Comment: Performed at [facility], [address] [place], [place] [address]  Troponin I (High Sensitivity)     Status: None   Collection Time: 11/22/22 11:17 AM  Result Value Ref Range   Troponin I (High Sensitivity) 4 <18 ng/L    Comment: (NOTE) Elevated high sensitivity troponin I (hsTnI) values and significant  changes across serial measurements may suggest ACS but many other  chronic and acute conditions are known to elevate hsTnI results.  Refer to the "Links" section for chest pain algorithms and additional  guidance. Performed at [facility], [address], [place], [place] [address]   [phone]  Status: Abnormal   Collection Time: 12/07/22 11:12 AM  Result Value Ref Range   Glucose 71 70 - 99 mg/dL   BUN 9 6 - 24 mg/dL   Creatinine, Ser 1.61 0.57 - 1.00 mg/dL   eGFR 80 >09 UE/AVW/0.98   BUN/Creatinine Ratio 11 9 - 23   Sodium 133 (L) 134 - 144 mmol/L   Potassium 4.2 3.5 - 5.2 mmol/L   Chloride 95 (L) 96 - 106 mmol/L   CO2 21 20 - 29 mmol/L   Calcium 8.9 8.7 - 10.2 mg/dL   Total Protein 6.0 6.0 - 8.5 g/dL   Albumin 3.6 (L) 3.8 - 4.9 g/dL   Globulin, Total 2.4 1.5 - 4.5 g/dL   Albumin/Globulin Ratio 1.5 1.2 - 2.2   Bilirubin Total 0.3 0.0 - 1.2 mg/dL   Alkaline Phosphatase 70 44 - 121 IU/L   AST 14 0 - 40 IU/L   ALT 13 0 - 32 IU/L  CBC with Diff     Status: Abnormal   Collection Time: 12/07/22 11:12 AM  Result Value Ref Range   WBC 8.9 3.4 - 10.8 x10E3/uL   RBC 4.17 3.77 - 5.28 x10E6/uL   Hemoglobin 12.6 11.1 - 15.9 g/dL   Hematocrit 11.9 14.7 - 46.6 %   MCV 89 79 - 97 fL   MCH 30.2 26.6 - 33.0 pg   MCHC 34.0 31.5 - 35.7 g/dL   RDW 82.9 56.2 - 13.0 %   Platelets 287 150 - 450 x10E3/uL   Neutrophils 69 Not Estab. %   Lymphs 12 Not  Estab. %   Monocytes 9 Not Estab. %   Eos 8 Not Estab. %   Basos 1 Not Estab. %   Neutrophils Absolute 6.2 1.4 - 7.0 x10E3/uL   Lymphocytes Absolute 1.0 0.7 - 3.1 x10E3/uL   Monocytes Absolute 0.8 0.1 - 0.9 x10E3/uL   EOS (ABSOLUTE) 0.7 (H) 0.0 - 0.4 x10E3/uL   Basophils Absolute 0.1 0.0 - 0.2 x10E3/uL   Immature Granulocytes 1 Not Estab. %   Immature Grans (Abs) 0.1 0.0 - 0.1 x10E3/uL       Assessment & Plan:   Problem List Items Addressed This Visit     Essential hypertension - Primary    Blood pressure well controlled with current medications.  Continue current therapy.  Will reassess at follow up.        Chronic obstructive pulmonary disease, unspecified COPD type    Still working to discontinue cigarette smoking.  Patient states that the chantix made her nauseated, so she hasn't been taking.   Sending Nausea meds to see if this helps.       Caregiver stress    I have suggested that patient get in contact with her husband's PCP and get them to complete the PCS form for him, so that they can get an aide to help him with care at home.       Tobacco use disorder, continuous    Will continue use of the Chantix.  I have sent nausea meds.   Patient will let me know if this is effective.         Return in about 1 month (around 01/20/2023) for F/U.   Total time spent: 20 minutes  Miki Kins, FNP  12/21/2022

## 2022-12-21 NOTE — Assessment & Plan Note (Signed)
I have suggested that patient get in contact with her husband's PCP and get them to complete the PCS form for him, so that they can get an aide to help him with care at home.

## 2022-12-21 NOTE — Assessment & Plan Note (Signed)
Blood pressure well controlled with current medications.  Continue current therapy.  Will reassess at follow up.  

## 2022-12-21 NOTE — Assessment & Plan Note (Signed)
Still working to discontinue cigarette smoking.  Patient states that the chantix made her nauseated, so she hasn't been taking.   Sending Nausea meds to see if this helps.

## 2022-12-21 NOTE — Assessment & Plan Note (Signed)
Will continue use of the Chantix.  I have sent nausea meds.   Patient will let me know if this is effective.

## 2022-12-22 ENCOUNTER — Encounter: Payer: Self-pay | Admitting: Family

## 2023-01-09 ENCOUNTER — Other Ambulatory Visit: Payer: Self-pay | Admitting: Hematology and Oncology

## 2023-01-14 ENCOUNTER — Ambulatory Visit
Admission: RE | Admit: 2023-01-14 | Discharge: 2023-01-14 | Disposition: A | Discharge status: Home / Self Care | Payer: Medicaid Other | Source: Ambulatory Visit | Attending: Medical Oncology | Admitting: Medical Oncology

## 2023-01-14 DIAGNOSIS — M8588 Other specified disorders of bone density and structure, other site: Secondary | ICD-10-CM | POA: Diagnosis not present

## 2023-01-14 DIAGNOSIS — C50411 Malignant neoplasm of upper-outer quadrant of right female breast: Secondary | ICD-10-CM | POA: Diagnosis present

## 2023-01-14 DIAGNOSIS — J449 Chronic obstructive pulmonary disease, unspecified: Secondary | ICD-10-CM | POA: Diagnosis not present

## 2023-01-14 DIAGNOSIS — M810 Age-related osteoporosis without current pathological fracture: Secondary | ICD-10-CM | POA: Insufficient documentation

## 2023-01-14 DIAGNOSIS — Z923 Personal history of irradiation: Secondary | ICD-10-CM | POA: Diagnosis not present

## 2023-01-14 DIAGNOSIS — Z9221 Personal history of antineoplastic chemotherapy: Secondary | ICD-10-CM | POA: Insufficient documentation

## 2023-01-14 DIAGNOSIS — Z1239 Encounter for other screening for malignant neoplasm of breast: Secondary | ICD-10-CM | POA: Diagnosis present

## 2023-01-15 ENCOUNTER — Other Ambulatory Visit: Payer: Self-pay | Admitting: Family

## 2023-01-16 ENCOUNTER — Other Ambulatory Visit: Payer: Self-pay | Admitting: Family

## 2023-01-17 ENCOUNTER — Other Ambulatory Visit: Payer: Self-pay | Admitting: Family

## 2023-01-21 ENCOUNTER — Ambulatory Visit: Payer: Medicaid Other | Admitting: Family

## 2023-01-29 ENCOUNTER — Other Ambulatory Visit: Payer: Self-pay | Admitting: Family

## 2023-01-29 MED ORDER — SERTRALINE HCL 100 MG PO TABS: 200.0000 mg | ORAL_TABLET | Freq: Every day | ORAL | 0 refills | Status: DC

## 2023-01-29 MED ORDER — LOSARTAN POTASSIUM 50 MG PO TABS: 50.0000 mg | ORAL_TABLET | Freq: Every day | ORAL | 1 refills | Status: DC

## 2023-01-31 ENCOUNTER — Telehealth: Payer: Self-pay | Admitting: Family

## 2023-01-31 NOTE — Telephone Encounter (Signed)
Patient called to inform Marchelle Folks that she is stopping her trazodone, tramadol and clonazepam. She states that she is stopping them because she doesn't need them anymore. Just FYI.

## 2023-02-12 ENCOUNTER — Encounter: Payer: Self-pay | Admitting: Family

## 2023-02-12 ENCOUNTER — Ambulatory Visit: Payer: Medicaid Other | Admitting: Family

## 2023-02-12 VITALS — BP 138/72 | HR 102 | Ht 67.0 in | Wt 133.4 lb

## 2023-02-12 DIAGNOSIS — F411 Generalized anxiety disorder: Secondary | ICD-10-CM

## 2023-02-12 DIAGNOSIS — J449 Chronic obstructive pulmonary disease, unspecified: Secondary | ICD-10-CM | POA: Diagnosis not present

## 2023-02-12 DIAGNOSIS — Z1211 Encounter for screening for malignant neoplasm of colon: Secondary | ICD-10-CM

## 2023-02-12 DIAGNOSIS — I10 Essential (primary) hypertension: Secondary | ICD-10-CM | POA: Diagnosis not present

## 2023-02-12 MED ORDER — GABAPENTIN 300 MG PO CAPS: 300.0000 mg | ORAL_CAPSULE | Freq: Every day | ORAL | 1 refills | Status: DC

## 2023-02-12 MED ORDER — HYDROXYZINE HCL 25 MG PO TABS: 25.0000 mg | ORAL_TABLET | Freq: Three times a day (TID) | ORAL | 0 refills | Status: DC | PRN

## 2023-02-12 MED ORDER — METHOCARBAMOL 500 MG PO TABS: 500.0000 mg | ORAL_TABLET | Freq: Four times a day (QID) | ORAL | 0 refills | Status: DC | PRN

## 2023-02-12 MED ORDER — TRAMADOL HCL 50 MG PO TABS: ORAL_TABLET | ORAL | 1 refills | Status: DC

## 2023-02-12 MED ORDER — BREZTRI AEROSPHERE 160-9-4.8 MCG/ACT IN AERO: 2.0000 | INHALATION_SPRAY | Freq: Two times a day (BID) | RESPIRATORY_TRACT | 5 refills | Status: DC

## 2023-02-12 MED ORDER — ZOLPIDEM TARTRATE 10 MG PO TABS: 10.0000 mg | ORAL_TABLET | Freq: Every evening | ORAL | 2 refills | Status: DC | PRN

## 2023-02-12 MED ORDER — LOSARTAN POTASSIUM 50 MG PO TABS: 50.0000 mg | ORAL_TABLET | Freq: Every day | ORAL | 1 refills | Status: DC

## 2023-02-12 MED ORDER — SERTRALINE HCL 100 MG PO TABS: 200.0000 mg | ORAL_TABLET | Freq: Every day | ORAL | 0 refills | Status: DC

## 2023-02-12 MED ORDER — CLINDAMYCIN HCL 300 MG PO CAPS: 300.0000 mg | ORAL_CAPSULE | Freq: Three times a day (TID) | ORAL | 0 refills | Status: AC

## 2023-02-12 MED ORDER — CLONAZEPAM 1 MG PO TABS: 1.0000 mg | ORAL_TABLET | Freq: Three times a day (TID) | ORAL | 2 refills | Status: DC

## 2023-02-12 NOTE — Assessment & Plan Note (Signed)
Patient stable.  Well controlled with current therapy.   Continue current meds.  

## 2023-02-12 NOTE — Progress Notes (Signed)
Established Patient Office Visit  Subjective:  Patient ID: Kelsey Russo, female    DOB: 10/02/1965  Age: 57 y.o. MRN: [id number]  Chief Complaint  Patient presents with   Follow-up    3 month follow up    Patient is here today for her 3 months follow up.  She has been feeling poorly since last appointment.   She does have additional concerns to discuss today.  She is having a significant increase in her anxiety, as her husband has not been doing particularly well.  He was recently hospitalized again.   She says that she has another infection in her tooth, asks if we can send antibiotics again.  She appears extremely anxious today.   Labs are not due today. She needs refills.   I have reviewed her active problem list, medication list, allergies, health maintenance, notes from last encounter, lab results for her appointment today.    No other concerns at this time.   Past Medical History:  Diagnosis Date   Anemia    during chemotherapy   Brain tumor (benign) (HCC)    Breast cancer (HCC) 1999   Status post right breast lumpectomy and radiation   Breast cancer (HCC) 11/2019   right breast   COPD (chronic obstructive pulmonary disease) (HCC)    Depression    History of chemotherapy 1999   History of seizure disorder    Hypertension    Personal history of chemotherapy    breast cancer 2021   Personal history of radiation therapy 1999   Pneumonia    PONV (postoperative nausea and vomiting)    vomiting    Past Surgical History:  Procedure Laterality Date   BRAIN SURGERY     Brain tumor resection  1991   BREAST BIOPSY Right 1999   Positive   BREAST BIOPSY Right 11/10/2019   [place] bx 10:00 heart marker, positive   BREAST BIOPSY Right 11/10/2019   [place] bx axilla, hydromark 4, positive   BREAST BIOPSY Left 11/10/2019   [place] bx 2:00, venus marker, neg   MASTECTOMY Right 07/13/2020   positive   PORTACATH PLACEMENT Left 11/30/2019   Procedure: INSERTION PORT-A-CATH;   Surgeon: Earline Mayotte, MD;  Location: [facility];  Service: General;  Laterality: Left;   Right breast lumpectomy     SIMPLE MASTECTOMY WITH AXILLARY SENTINEL NODE BIOPSY Right 07/13/2020   Procedure: SIMPLE MASTECTOMY WITH AXILLARY SENTINEL NODE BIOPSY;  Surgeon: Earline Mayotte, MD;  Location: [facility];  Service: General;  Laterality: Right;   TONSILLECTOMY     TUBAL LIGATION      Social History   Socioeconomic History   Marital status: Married    Spouse name: Not on file   Number of children: Not on file   Years of education: Not on file   Highest education level: Not on file  Occupational History   Not on file  Tobacco Use   Smoking status: Every Day    Packs/day: 0.25    Years: 40.00    Additional pack years: 0.00    Total pack years: 10.00    Types: Cigarettes   Smokeless tobacco: Never  Vaping Use   Vaping Use: Never used  Substance and Sexual Activity   Alcohol use: No    Alcohol/week: 0.0 standard drinks of alcohol   Drug use: Yes    Frequency: 3.0 times per week    Types: Marijuana   Sexual activity: Not on file  Other Topics Concern  Not on file  Social History Narrative   Lives at home with husband.   Social Determinants of Health   Financial Resource Strain: Not on file  Food Insecurity: Not on file  Transportation Needs: Not on file  Physical Activity: Not on file  Stress: Not on file  Social Connections: Not on file  Intimate Partner Violence: Not on file    Family History  Problem Relation Age of Onset   Depression Mother    CAD Father    Hypertension Father    Lung cancer Father    Coronary artery disease Father 61   Breast cancer Maternal Grandmother     Allergies  Allergen Reactions   Imitrex [Sumatriptan] Anaphylaxis   Penicillins Rash    Did it involve swelling of the face/tongue/throat, SOB, or low BP? Unknown Did it involve sudden or severe rash/hives, skin peeling, or any reaction on the inside of your mouth or nose?  Unknown Did you need to seek medical attention at a hospital or doctor's office? Unknown When did it last happen? ? If all above answers are "NO", may proceed with cephalosporin use.     Review of Systems  HENT:         Tooth pain  Psychiatric/Behavioral:  The patient is nervous/anxious and has insomnia.   All other systems reviewed and are negative.      Objective:   BP 138/72   Pulse (!) 102   Ht 5\' 7"  (1.702 m)   Wt 133 lb 6.4 oz (60.5 kg)   SpO2 98%   BMI 20.89 kg/m   Vitals:   02/12/23 1000  BP: 138/72  Pulse: (!) 102  Height: 5\' 7"  (1.702 m)  Weight: 133 lb 6.4 oz (60.5 kg)  SpO2: 98%  BMI (Calculated): 20.89    Physical Exam Vitals and nursing note reviewed.  Constitutional:      Appearance: Normal appearance. She is normal weight.  HENT:     Head: Normocephalic.     Mouth/Throat:     Dentition: Abnormal dentition. Dental abscesses present.   Eyes:     Extraocular Movements: Extraocular movements intact.     Conjunctiva/sclera: Conjunctivae normal.     Pupils: Pupils are equal, round, and reactive to light.  Cardiovascular:     Rate and Rhythm: Normal rate.  Pulmonary:     Effort: Pulmonary effort is normal.  Neurological:     General: No focal deficit present.     Mental Status: She is alert and oriented to person, place, and time. Mental status is at baseline.  Psychiatric:        Attention and Perception: Attention normal.        Mood and Affect: Mood is anxious. Affect is labile.        Speech: Speech normal.        Behavior: Behavior normal. Behavior is cooperative.        Thought Content: Thought content normal.        Cognition and Memory: Cognition and memory normal.        Judgment: Judgment normal.      No results found for any visits on 02/12/23.  Recent Results (from the past 2160 hour(s))  Basic metabolic panel     Status: Abnormal   Collection Time: 11/22/22  9:16 AM  Result Value Ref Range   Sodium 138 135 - 145 mmol/L    Potassium 2.9 (L) 3.5 - 5.1 mmol/L   Chloride 108 98 - 111 mmol/L  CO2 20 (L) 22 - 32 mmol/L   Glucose, Bld 94 70 - 99 mg/dL    Comment: Glucose reference range applies only to samples taken after fasting for at least 8 hours.   BUN 9 6 - 20 mg/dL   Creatinine, Ser 3.29 0.44 - 1.00 mg/dL   Calcium 9.6 8.9 - 51.8 mg/dL   GFR, Estimated >84 >16 mL/min    Comment: (NOTE) Calculated using the CKD-EPI Creatinine Equation (2021)    Anion gap 10 5 - 15    Comment: Performed at [facility], [address] [place], [place] [address]  CBC     Status: Abnormal   Collection Time: 11/22/22  9:16 AM  Result Value Ref Range   WBC 10.1 4.0 - 10.5 K/uL   RBC 5.07 3.87 - 5.11 MIL/uL   Hemoglobin 15.3 (H) 12.0 - 15.0 g/dL   HCT 16.0 10.9 - 32.3 %   MCV 87.2 80.0 - 100.0 fL   MCH 30.2 26.0 - 34.0 pg   MCHC 34.6 30.0 - 36.0 g/dL   RDW 55.7 32.2 - 02.5 %   Platelets 219 150 - 400 K/uL   nRBC 0.0 0.0 - 0.2 %    Comment: Performed at [facility], [address] [place], [place] [address]  Troponin I (High Sensitivity)     Status: None   Collection Time: 11/22/22  9:16 AM  Result Value Ref Range   Troponin I (High Sensitivity) 3 <18 ng/L    Comment: (NOTE) Elevated high sensitivity troponin I (hsTnI) values and significant  changes across serial measurements may suggest ACS but many other  chronic and acute conditions are known to elevate hsTnI results.  Refer to the "Links" section for chest pain algorithms and additional  guidance. Performed at [facility], [address] [place], [place] [address]   Resp panel by RT-PCR (RSV, Flu A&B, Covid) Anterior Nasal Swab     Status: None   Collection Time: 11/22/22  9:16 AM   Specimen: Anterior Nasal Swab  Result Value Ref Range   SARS Coronavirus 2 by RT PCR NEGATIVE NEGATIVE    Comment: (NOTE) SARS-CoV-2 target nucleic acids are NOT DETECTED.  The SARS-CoV-2 RNA is generally detectable in upper  respiratory specimens during the acute phase of infection. The lowest concentration of SARS-CoV-2 viral copies this assay can detect is 138 copies/mL. A negative result does not preclude SARS-Cov-2 infection and should not be used as the sole basis for treatment or other patient management decisions. A negative result may occur with  improper specimen collection/handling, submission of specimen other than nasopharyngeal swab, presence of viral mutation(s) within the areas targeted by this assay, and inadequate number of viral copies(<138 copies/mL). A negative result must be combined with clinical observations, patient history, and epidemiological information. The expected result is Negative.  Fact Sheet for Patients:  BloggerCourse.com  Fact Sheet for Healthcare Providers:  SeriousBroker.it  This test is no t yet approved or cleared by the [place] FDA and  has been authorized for detection and/or diagnosis of SARS-CoV-2 by FDA under an Emergency Use Authorization (EUA). This EUA will remain  in effect (meaning this test can be used) for the duration of the COVID-19 declaration under Section 564(b)(1) of the Act, 21 U.S.C.section 360bbb-3(b)(1), unless the authorization is terminated  or revoked sooner.       Influenza A by PCR NEGATIVE NEGATIVE   Influenza B by PCR NEGATIVE NEGATIVE    Comment: (NOTE) The  Xpert Xpress SARS-CoV-2/FLU/RSV plus assay is intended as an aid in the diagnosis of influenza from Nasopharyngeal swab specimens and should not be used as a sole basis for treatment. Nasal washings and aspirates are unacceptable for Xpert Xpress SARS-CoV-2/FLU/RSV testing.  Fact Sheet for Patients: BloggerCourse.com  Fact Sheet for Healthcare Providers: SeriousBroker.it  This test is not yet approved or cleared by the [place] FDA and has been authorized for  detection and/or diagnosis of SARS-CoV-2 by FDA under an Emergency Use Authorization (EUA). This EUA will remain in effect (meaning this test can be used) for the duration of the COVID-19 declaration under Section 564(b)(1) of the Act, 21 U.S.C. section 360bbb-3(b)(1), unless the authorization is terminated or revoked.     Resp Syncytial Virus by PCR NEGATIVE NEGATIVE    Comment: (NOTE) Fact Sheet for Patients: BloggerCourse.com  Fact Sheet for Healthcare Providers: SeriousBroker.it  This test is not yet approved or cleared by the [place] FDA and has been authorized for detection and/or diagnosis of SARS-CoV-2 by FDA under an Emergency Use Authorization (EUA). This EUA will remain in effect (meaning this test can be used) for the duration of the COVID-19 declaration under Section 564(b)(1) of the Act, 21 U.S.C. section 360bbb-3(b)(1), unless the authorization is terminated or revoked.  Performed at [facility], [address] [place], [place] [address]   Hepatic function panel     Status: None   Collection Time: 11/22/22  9:16 AM  Result Value Ref Range   Total Protein 7.2 6.5 - 8.1 g/dL   Albumin 3.9 3.5 - 5.0 g/dL   AST 19 15 - 41 U/L   ALT 11 0 - 44 U/L   Alkaline Phosphatase 51 38 - 126 U/L   Total Bilirubin 0.8 0.3 - 1.2 mg/dL   Bilirubin, Direct 0.1 0.0 - 0.2 mg/dL   Indirect Bilirubin 0.7 0.3 - 0.9 mg/dL    Comment: Performed at [facility], [address] [place], [place] [address]  Lipase, blood     Status: None   Collection Time: 11/22/22  9:16 AM  Result Value Ref Range   Lipase 34 11 - 51 U/L    Comment: Performed at [facility], [address], [place], [place] [address]  Troponin I (High Sensitivity)     Status: None   Collection Time: 11/22/22 11:17 AM  Result Value Ref Range   Troponin I (High Sensitivity) 4 <18 ng/L    Comment: (NOTE) Elevated high sensitivity  troponin I (hsTnI) values and significant  changes across serial measurements may suggest ACS but many other  chronic and acute conditions are known to elevate hsTnI results.  Refer to the "Links" section for chest pain algorithms and additional  guidance. Performed at [facility], [address] [place], [place] [address]   CMP14+EGFR     Status: Abnormal   Collection Time: 12/07/22 11:12 AM  Result Value Ref Range   Glucose 71 70 - 99 mg/dL   BUN 9 6 - 24 mg/dL   Creatinine, Ser 2.95 0.57 - 1.00 mg/dL   eGFR 80 >62 ZH/YQM/5.78   BUN/Creatinine Ratio 11 9 - 23   Sodium 133 (L) 134 - 144 mmol/L   Potassium 4.2 3.5 - 5.2 mmol/L   Chloride 95 (L) 96 - 106 mmol/L   CO2 21 20 - 29 mmol/L   Calcium 8.9 8.7 - 10.2 mg/dL   Total Protein 6.0 6.0 - 8.5 g/dL   Albumin 3.6 (L) 3.8 -  4.9 g/dL   Globulin, Total 2.4 1.5 - 4.5 g/dL   Albumin/Globulin Ratio 1.5 1.2 - 2.2   Bilirubin Total 0.3 0.0 - 1.2 mg/dL   Alkaline Phosphatase 70 44 - 121 IU/L   AST 14 0 - 40 IU/L   ALT 13 0 - 32 IU/L  CBC with Diff     Status: Abnormal   Collection Time: 12/07/22 11:12 AM  Result Value Ref Range   WBC 8.9 3.4 - 10.8 x10E3/uL   RBC 4.17 3.77 - 5.28 x10E6/uL   Hemoglobin 12.6 11.1 - 15.9 g/dL   Hematocrit 52.8 41.3 - 46.6 %   MCV 89 79 - 97 fL   MCH 30.2 26.6 - 33.0 pg   MCHC 34.0 31.5 - 35.7 g/dL   RDW 24.4 01.0 - 27.2 %   Platelets 287 150 - 450 x10E3/uL   Neutrophils 69 Not Estab. %   Lymphs 12 Not Estab. %   Monocytes 9 Not Estab. %   Eos 8 Not Estab. %   Basos 1 Not Estab. %   Neutrophils Absolute 6.2 1.4 - 7.0 x10E3/uL   Lymphocytes Absolute 1.0 0.7 - 3.1 x10E3/uL   Monocytes Absolute 0.8 0.1 - 0.9 x10E3/uL   EOS (ABSOLUTE) 0.7 (H) 0.0 - 0.4 x10E3/uL   Basophils Absolute 0.1 0.0 - 0.2 x10E3/uL   Immature Granulocytes 1 Not Estab. %   Immature Grans (Abs) 0.1 0.0 - 0.1 x10E3/uL       Assessment & Plan:   Problem List Items Addressed This Visit       Active Problems    Essential hypertension    Blood pressure well controlled with current medications.  Continue current therapy.  Will reassess at follow up.       Relevant Medications   losartan (COZAAR) 50 MG tablet   Chronic obstructive pulmonary disease, unspecified COPD type (HCC)    Patient stable.  Well controlled with current therapy.   Continue current meds.       Relevant Medications   BREZTRI AEROSPHERE 160-9-4.8 MCG/ACT AERO   varenicline (CHANTIX) 1 MG tablet   Generalized anxiety disorder    Patient stable.  Well controlled with current therapy.   Continue current meds.        Relevant Medications   hydrOXYzine (ATARAX) 25 MG tablet   sertraline (ZOLOFT) 100 MG tablet   Other Visit Diagnoses     Screening for colon cancer    -  Primary   Relevant Orders   Cologuard       Return in about 3 months (around 05/15/2023) for F/U.   Total time spent: 30 minutes  Miki Kins, FNP  02/12/2023   This document may have been prepared by [facility] Voice Recognition software and as such may include unintentional dictation errors.

## 2023-02-12 NOTE — Assessment & Plan Note (Signed)
Blood pressure well controlled with current medications.  Continue current therapy.  Will reassess at follow up.  

## 2023-02-15 ENCOUNTER — Other Ambulatory Visit: Payer: Self-pay | Admitting: Family

## 2023-02-21 ENCOUNTER — Encounter: Payer: Self-pay | Admitting: Family

## 2023-02-21 ENCOUNTER — Telehealth: Payer: Self-pay | Admitting: Family

## 2023-02-21 NOTE — Telephone Encounter (Signed)
Patient called and she is still sick from when she was in last week. She has finished the antibiotics and they haven't helped. Can you send her in something else please?  [facility]

## 2023-02-22 ENCOUNTER — Telehealth: Payer: Self-pay | Admitting: Family

## 2023-02-22 MED ORDER — METRONIDAZOLE 500 MG PO TABS: 500.0000 mg | ORAL_TABLET | Freq: Two times a day (BID) | ORAL | 0 refills | Status: DC

## 2023-02-22 MED ORDER — CLINDAMYCIN HCL 300 MG PO CAPS: 300.0000 mg | ORAL_CAPSULE | Freq: Three times a day (TID) | ORAL | 0 refills | Status: AC

## 2023-02-22 NOTE — Telephone Encounter (Signed)
Patient left VM that she needs another antibiotic sent in for her bad tooth and her ear. States that it is not any better and she needs something for pain as well. Please advise.

## 2023-02-22 NOTE — Telephone Encounter (Signed)
Patient called again regarding the message below. Please advise.   [facility]

## 2023-02-26 ENCOUNTER — Other Ambulatory Visit: Payer: Self-pay | Admitting: Family

## 2023-03-01 ENCOUNTER — Inpatient Hospital Stay: Payer: Medicaid Other | Attending: Oncology | Admitting: Medical Oncology

## 2023-03-01 ENCOUNTER — Encounter: Payer: Self-pay | Admitting: Medical Oncology

## 2023-03-01 VITALS — BP 101/75 | HR 79 | Temp 98.2°F | Resp 16 | Ht 67.0 in | Wt 135.0 lb

## 2023-03-01 DIAGNOSIS — M792 Neuralgia and neuritis, unspecified: Secondary | ICD-10-CM

## 2023-03-01 DIAGNOSIS — Z79810 Long term (current) use of selective estrogen receptor modulators (SERMs): Secondary | ICD-10-CM | POA: Insufficient documentation

## 2023-03-01 DIAGNOSIS — Z170 Estrogen receptor positive status [ER+]: Secondary | ICD-10-CM | POA: Insufficient documentation

## 2023-03-01 DIAGNOSIS — M818 Other osteoporosis without current pathological fracture: Secondary | ICD-10-CM

## 2023-03-01 DIAGNOSIS — G629 Polyneuropathy, unspecified: Secondary | ICD-10-CM | POA: Diagnosis not present

## 2023-03-01 DIAGNOSIS — C50411 Malignant neoplasm of upper-outer quadrant of right female breast: Secondary | ICD-10-CM | POA: Insufficient documentation

## 2023-03-01 DIAGNOSIS — F419 Anxiety disorder, unspecified: Secondary | ICD-10-CM | POA: Insufficient documentation

## 2023-03-01 DIAGNOSIS — R634 Abnormal weight loss: Secondary | ICD-10-CM

## 2023-03-01 DIAGNOSIS — M810 Age-related osteoporosis without current pathological fracture: Secondary | ICD-10-CM | POA: Diagnosis not present

## 2023-03-01 NOTE — Research (Signed)
SWOG Z6109 Week 156 Assessment:  Patient in to clinic for her scheduled week 156 assessment unaccompanied.The patient completed her PRO questionnaires for the protocol without any difficulties. She states her feet feel like they are sweating and numb all the time, she also has tingling and foot cramps with this. She states her hands also cramp and she has intermittent numbness to her fingers. Patient states her feet always feel cold and sweaty. Patient states her symptoms have just gradually become worse. She is having to care for her husband who is currently bed bound due to Stage 4 Lung cancer, she is his primary care giver. States this is difficult for her due to her neuropathy. Patient does not complain of pain related to her neuropathy, pain in her back today. Patient is aware this is her last protocol follow up for the S1714 protocol today. Encouraged to call research nurse if she has any questions or concerns any time in the future. Clent Jacks, PA will see patient to assess her peripheral neuropathy today.   Chriss Driver, RN [phone] 03/01/2023  Adverse Event Log  Study/Protocol: [id number] Cycle: Week 104  Event Grade Onset Date Resolved Date Drug Name Attribution Treatment Comments   Dysesthesia 0         Neuralgia 0         Paresthesia Grade 2  03/01/2023   Related     Peripheral Motor Neuropathy 0         Peripheral Sensory Neuropathy  Grade 2  03/02/2020  07/20/2020  Related   None    Fatigue Grade 1 03/02/2020   Possible None   Pain Grade 1  07/18/2020   Not related  To her back only    Chriss Driver, RN, BSN, OCN 03/01/23 11:32 AM

## 2023-03-01 NOTE — Progress Notes (Signed)
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  INTERVAL HISTORY: Ms. Bails return to clinic today for routine 60-month evaluation.   She states that she is doing well all things considered. In terms of her breast cancer history, she denies any new lumps, bumps, discharge, pain of breast or axilla. Tolerating her Tamoxifen well with mild chronic night sweats. Last mammogram was on 01/14/2023 which was benign appearing. Last DEXA was on 01/13/2022.   Wt Readings from Last 3 Encounters:  03/01/23 135 lb (61.2 kg)  02/12/23 133 lb 6.4 oz (60.5 kg)  12/21/22 135 lb 6.4 oz (61.4 kg)    REVIEW OF SYSTEMS:   Review of Systems  Constitutional: Negative.  Negative for fever, malaise/fatigue and weight loss.  HENT:  Negative for sore throat.   Respiratory: Negative.  Negative for cough, hemoptysis and shortness of breath.   Cardiovascular: Negative.  Negative for chest pain, palpitations and leg swelling.  Gastrointestinal:  Negative for abdominal pain, diarrhea, nausea and vomiting.  Genitourinary: Negative.  Negative for dysuria.  Musculoskeletal: Negative.  Negative for back pain and myalgias.  Skin: Negative.  Negative for rash.  Neurological: Negative.  Negative for dizziness, sensory change, weakness and headaches.  Psychiatric/Behavioral: Negative.  Negative for depression. The patient is not nervous/anxious and does not have insomnia.      As per HPI. Otherwise, a complete review of systems is negative.  PAST MEDICAL HISTORY: Past Medical History:  Diagnosis Date   Anemia    during chemotherapy   Brain tumor (benign) (HCC)    Breast cancer (HCC) 1999   Status post right breast lumpectomy and radiation   Breast cancer (HCC) 11/2019   right breast   COPD (chronic obstructive pulmonary disease) (HCC)    Depression    History of chemotherapy 1999   History of seizure disorder    Hypertension    Personal history of chemotherapy    breast cancer 2021   Personal history of radiation therapy 1999   Pneumonia    PONV (postoperative nausea and vomiting)    vomiting    PAST SURGICAL HISTORY: Past Surgical History:  Procedure Laterality Date   BRAIN SURGERY     Brain tumor resection  1991   BREAST BIOPSY Right 1999   Positive   BREAST BIOPSY Right 11/10/2019   [place] bx 10:00 heart marker, positive   BREAST BIOPSY Right 11/10/2019   [place] bx axilla, hydromark 4, positive   BREAST BIOPSY Left 11/10/2019   [place] bx 2:00, venus marker, neg   MASTECTOMY Right 07/13/2020   positive   PORTACATH PLACEMENT Left 11/30/2019   Procedure: INSERTION PORT-A-CATH;  Surgeon: Earline Mayotte, MD;  Location: [facility] ORS;  Service: General;  Laterality: Left;   Right breast lumpectomy     SIMPLE MASTECTOMY WITH AXILLARY SENTINEL NODE BIOPSY Right 07/13/2020   Procedure: SIMPLE MASTECTOMY WITH AXILLARY SENTINEL NODE BIOPSY;  Surgeon: Earline Mayotte, MD;  Location: [facility] ORS;  Service: General;  Laterality: Right;   TONSILLECTOMY     TUBAL  LIGATION      FAMILY HISTORY: Family History  Problem Relation Age of Onset   Depression Mother    CAD Father    Hypertension Father    Lung cancer Father    Coronary artery disease Father 77   Breast cancer Maternal Grandmother     ADVANCED DIRECTIVES (Y/N):  N  HEALTH MAINTENANCE: Social History   Tobacco Use   Smoking status: Every Day    Packs/day: 0.25    Years: 40.00     Additional pack years: 0.00    Total pack years: 10.00    Types: Cigarettes   Smokeless tobacco: Never  Vaping Use   Vaping Use: Never used  Substance Use Topics   Alcohol use: No    Alcohol/week: 0.0 standard drinks of alcohol   Drug use: Yes    Frequency: 3.0 times per week    Types: Marijuana   Allergies  Allergen Reactions   Imitrex [Sumatriptan] Anaphylaxis   Penicillins Rash    Did it involve swelling of the face/tongue/throat, SOB, or low BP? Unknown Did it involve sudden or severe rash/hives, skin peeling, or any reaction on the inside of your mouth or nose? Unknown Did you need to seek medical attention at a hospital or doctor's office? Unknown When did it last happen? ? If all above answers are "NO", may proceed with cephalosporin use.     Current Outpatient Medications  Medication Sig Dispense Refill   albuterol (VENTOLIN HFA) 108 (90 Base) MCG/ACT inhaler Inhale 1-2 puffs into the lungs every 6 (six) hours as needed for wheezing or shortness of breath.     alendronate (FOSAMAX) 70 MG tablet TAKE ONE TABLET (70 MG) BY MOUTH ONCE A WEEK. TAKE WITH A FULL GLASS OF WATER ON AN EMPTY STOMACH. 4 tablet 3   BREZTRI AEROSPHERE 160-9-4.8 MCG/ACT AERO Inhale 2 puffs into the lungs in the morning and at bedtime. 10.7 g 5   clindamycin (CLEOCIN) 300 MG capsule Take 1 capsule (300 mg total) by mouth 3 (three) times daily for 21 days. 63 capsule 0   clonazePAM (KLONOPIN) 1 MG tablet Take 1 tablet (1 mg total) by mouth 3 (three) times daily. 90 tablet 2   clotrimazole (MYCELEX) 10 MG troche TAKE ONE TABLET (10 MG TOTAL) BY MOUTH FIVE (FIVE) TIMES DAILY FOR SEVEN DAYS. 35 Troche 0   gabapentin (NEURONTIN) 300 MG capsule Take 1 capsule (300 mg total) by mouth daily. 90 capsule 1   hydrOXYzine (ATARAX) 25 MG tablet TAKE ONE TABLET (25 MG TOTAL) BY MOUTH THREE (THREE) TIMES DAILY AS NEEDED FOR ANXIETY. 90 tablet 3   losartan (COZAAR) 50 MG tablet Take 1 tablet (50 mg total) by mouth  daily. 90 tablet 1   methocarbamol (ROBAXIN) 500 MG tablet Take 1 tablet (500 mg total) by mouth every 6 (six) hours as needed for muscle spasms. 120 tablet 0   metroNIDAZOLE (FLAGYL) 500 MG tablet TAKE ONE TABLET (500 MG TOTAL) BY MOUTH TWO (TWO) TIMES DAILY. 14 tablet 0   prochlorperazine (COMPAZINE) 10 MG tablet Take 10 mg by mouth every 8 (eight) hours as needed.     sertraline (ZOLOFT) 100 MG tablet Take 2 tablets (200 mg total) by mouth daily. 180 tablet 0   tamoxifen (NOLVADEX) 20 MG tablet TAKE ONE TABLET BY MOUTH ONCE A DAY 30 tablet 3   tiZANidine (ZANAFLEX) 4 MG tablet Take 4 mg by mouth daily as needed for muscle spasms.     traMADol (ULTRAM) 50 MG  tablet TAKE TWO TABLETS BY MOUTH TWICE DAILY AS NEEDED FOR SEVERE PAIN (NOTE DOSE INCREASE) 120 tablet 1   traZODone (DESYREL) 50 MG tablet TAKE ONE TO TWO TABLETS BY MOUTH EACH NIGHT AT BEDTIME AS NEEDED FOR INSOMNIA 180 tablet 3   varenicline (CHANTIX) 1 MG tablet Take 1 mg by mouth 2 (two) times daily.     zolpidem (AMBIEN) 10 MG tablet Take 1 tablet (10 mg total) by mouth at bedtime as needed for sleep. 30 tablet 2   potassium chloride (MICRO-K) 10 MEQ CR capsule Take 1 capsule (10 mEq total) by mouth 2 (two) times daily for 3 days. 6 capsule 0   No current facility-administered medications for this visit.    OBJECTIVE: Vitals:   03/01/23 1108  BP: 101/75  Pulse: 79  Resp: 16  Temp: 98.2 F (36.8 C)  SpO2: 98%     Body mass index is 21.14 kg/m.    ECOG FS:0 - Asymptomatic  General: Well-developed, well-nourished, no acute distress. Eyes: Pink conjunctiva, anicteric sclera. HEENT: Normocephalic, moist mucous membranes. Breast: Pt deferred breast exam  Lungs: No audible wheezing or coughing. Heart: Regular rate and rhythm. Abdomen: Soft, nontender, no obvious distention. Musculoskeletal: No edema, cyanosis, or clubbing. Neuro: Alert, answering all questions appropriately. Cranial nerves grossly intact. Skin: No rashes or  petechiae noted. Psych: Normal affect.   LAB RESULTS:  Lab Results  Component Value Date   NA 133 (L) 12/07/2022   K 4.2 12/07/2022   CL 95 (L) 12/07/2022   CO2 21 12/07/2022   GLUCOSE 71 12/07/2022   BUN 9 12/07/2022   CREATININE 0.84 12/07/2022   CALCIUM 8.9 12/07/2022   PROT 6.0 12/07/2022   ALBUMIN 3.6 (L) 12/07/2022   AST 14 12/07/2022   ALT 13 12/07/2022   ALKPHOS 70 12/07/2022   BILITOT 0.3 12/07/2022   GFRNONAA >60 11/22/2022   GFRAA >60 04/20/2020    Lab Results  Component Value Date   WBC 8.9 12/07/2022   NEUTROABS 6.2 12/07/2022   HGB 12.6 12/07/2022   HCT 37.1 12/07/2022   MCV 89 12/07/2022   PLT 287 12/07/2022     STUDIES: No results found.  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 Encounter Diagnoses  Name Primary?   Primary cancer of upper outer quadrant of right female breast (HCC) Yes   Other osteoporosis without current pathological fracture    Weight loss    Neuropathic pain    Care related to current tamoxifen use    PLAN:    1. 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 She completed 4 cycles of Adriamycin and Cytoxan.  She only received 10 infusions of weekly Taxol her last one occurring on April 06, 2020.  Given her persistently decreased performance status and worsening neuropathy, treatment was discontinued. She underwent simple mastectomy on July 13, 2020 with significant improvement of her disease burden.  She did not require XRT given her simple mastectomy.   Her most recent CT scan on December 05, 2020 did not reveal any evidence of recurrent or progressive disease.  Letrozole was discontinued secondary to osteoporosis and patient was initiated on tamoxifen which she will require to take for a minimum of 5 years completing  treatment in December 2026. Given her high risk disease, could possibly extend treatment for 7 to 10  years. Reviewed her most recent mammogram from 01/14/2023 which was Bi-Rads category 1. At this time she will continue her Tamoxifen (Dec 2026 5 year completion date). RTC 6 months for follow up.  2.  Depression/anxiety: Currently well managed by PCP.  3.  Osteoporosis: Chronic- Her most recent DEXA scan on 01/14/2023 showed a Tscore of -2.5 which is improved from previous of -2.7.  5. Weight Loss: Improved with Boost shakes and improvement of her depression.  6. Neuropathic pain: Chronic and stable.   Disposition: RTC 6 months MD, labs ( CBC w/, CMP, vit D)- [place]    Patient expressed understanding and was in agreement with this plan. She also understands that She can call clinic at any time with any questions, concerns, or complaints.    Cancer Staging  Primary cancer of upper outer quadrant of right female breast [facility]) Staging form: Breast, AJCC 8th Edition - Clinical stage from 11/26/2019: Stage IIIA (cT2, cN2a, cM0, G3, ER+, PR+, HER2-) - Signed by Jeralyn Ruths, MD on 11/26/2019 Stage prefix: Initial diagnosis Histologic grading system: 3 grade system   Rushie Chestnut, PA-C   03/01/2023 4:03 PM

## 2023-03-04 ENCOUNTER — Other Ambulatory Visit: Payer: Self-pay | Admitting: Family

## 2023-03-05 ENCOUNTER — Ambulatory Visit: Payer: Medicaid Other | Admitting: Oncology

## 2023-03-11 ENCOUNTER — Other Ambulatory Visit: Payer: Self-pay | Admitting: Family

## 2023-03-13 ENCOUNTER — Other Ambulatory Visit: Payer: Self-pay | Admitting: Family

## 2023-04-08 ENCOUNTER — Other Ambulatory Visit: Payer: Self-pay | Admitting: Family

## 2023-05-07 ENCOUNTER — Other Ambulatory Visit: Payer: Self-pay | Admitting: Family

## 2023-05-15 ENCOUNTER — Ambulatory Visit: Payer: Medicaid Other | Admitting: Family

## 2023-05-22 ENCOUNTER — Ambulatory Visit: Payer: Medicaid Other | Admitting: Family

## 2023-05-30 ENCOUNTER — Other Ambulatory Visit: Payer: Self-pay | Admitting: Family

## 2023-05-30 ENCOUNTER — Ambulatory Visit: Payer: Medicaid Other | Admitting: Family

## 2023-05-30 VITALS — BP 105/70 | HR 75 | Ht 67.0 in | Wt 138.0 lb

## 2023-05-30 DIAGNOSIS — E559 Vitamin D deficiency, unspecified: Secondary | ICD-10-CM

## 2023-05-30 DIAGNOSIS — J449 Chronic obstructive pulmonary disease, unspecified: Secondary | ICD-10-CM

## 2023-05-30 DIAGNOSIS — E538 Deficiency of other specified B group vitamins: Secondary | ICD-10-CM | POA: Diagnosis not present

## 2023-05-30 DIAGNOSIS — E782 Mixed hyperlipidemia: Secondary | ICD-10-CM

## 2023-05-30 DIAGNOSIS — R5383 Other fatigue: Secondary | ICD-10-CM

## 2023-05-30 DIAGNOSIS — Z636 Dependent relative needing care at home: Secondary | ICD-10-CM

## 2023-05-30 DIAGNOSIS — R7303 Prediabetes: Secondary | ICD-10-CM

## 2023-05-30 DIAGNOSIS — I10 Essential (primary) hypertension: Secondary | ICD-10-CM | POA: Diagnosis not present

## 2023-05-30 MED ORDER — HYDROCODONE-ACETAMINOPHEN 5-325 MG PO TABS: 1.0000 | ORAL_TABLET | Freq: Four times a day (QID) | ORAL | 0 refills | Status: AC | PRN

## 2023-05-30 MED ORDER — ZOLPIDEM TARTRATE 10 MG PO TABS: 10.0000 mg | ORAL_TABLET | Freq: Every evening | ORAL | 2 refills | Status: DC | PRN

## 2023-05-31 LAB — LIPID PANEL
Chol/HDL Ratio: 3.5 {ratio} (ref 0.0–4.4)
Cholesterol, Total: 169 mg/dL (ref 100–199)
HDL: 48 mg/dL (ref >39)
LDL Chol Calc (NIH): 107 mg/dL — ABNORMAL HIGH (ref 0–99)
Triglycerides: 75 mg/dL (ref 0–149)
VLDL Cholesterol Cal: 14 mg/dL (ref 5–40)

## 2023-05-31 LAB — CMP14+EGFR
ALT: 10 [IU]/L (ref 0–32)
AST: 16 [IU]/L (ref 0–40)
Albumin: 4.1 g/dL (ref 3.8–4.9)
Alkaline Phosphatase: 74 [IU]/L (ref 44–121)
BUN/Creatinine Ratio: 7 — ABNORMAL LOW (ref 9–23)
BUN: 7 mg/dL (ref 6–24)
Bilirubin Total: 0.4 mg/dL (ref 0.0–1.2)
CO2: 22 mmol/L (ref 20–29)
Calcium: 9.2 mg/dL (ref 8.7–10.2)
Chloride: 102 mmol/L (ref 96–106)
Creatinine, Ser: 0.99 mg/dL (ref 0.57–1.00)
Globulin, Total: 2.3 g/dL (ref 1.5–4.5)
Glucose: 71 mg/dL (ref 70–99)
Potassium: 4.5 mmol/L (ref 3.5–5.2)
Sodium: 137 mmol/L (ref 134–144)
Total Protein: 6.4 g/dL (ref 6.0–8.5)
eGFR: 66 mL/min/{1.73_m2} (ref >59)

## 2023-05-31 LAB — VITAMIN D 25 HYDROXY (VIT D DEFICIENCY, FRACTURES): Vit D, 25-Hydroxy: 25.4 ng/mL — ABNORMAL LOW (ref 30.0–100.0)

## 2023-05-31 LAB — TSH: TSH: 1.41 u[IU]/mL (ref 0.450–4.500)

## 2023-05-31 LAB — HEMOGLOBIN A1C
Est. average glucose Bld gHb Est-mCnc: 103 mg/dL
Hgb A1c MFr Bld: 5.2 % (ref 4.8–5.6)

## 2023-05-31 LAB — VITAMIN B12: Vitamin B-12: 386 pg/mL (ref 232–1245)

## 2023-06-04 ENCOUNTER — Other Ambulatory Visit: Payer: Self-pay | Admitting: Family

## 2023-06-06 ENCOUNTER — Telehealth: Payer: Self-pay

## 2023-06-06 ENCOUNTER — Inpatient Hospital Stay: Payer: Medicaid Other | Attending: Oncology

## 2023-06-06 ENCOUNTER — Other Ambulatory Visit: Payer: Self-pay

## 2023-06-06 MED ORDER — "LUER LOCK SAFETY SYRINGES 21G X 1-1/2"" 3 ML MISC": 0 refills | Status: DC

## 2023-06-06 MED ORDER — CYANOCOBALAMIN 1000 MCG/ML IJ SOLN: 1000.0000 ug | INTRAMUSCULAR | 0 refills | Status: DC

## 2023-06-06 NOTE — Progress Notes (Signed)
[facility] CSW Progress Note  Clinical Child psychotherapist returned patient's call.  She had questions about the Medicaid she receives for her breast cancer.  CSW provided her with contact information.    Dorothey Baseman, LCSW Clinical Social Worker [facility]

## 2023-06-06 NOTE — Telephone Encounter (Signed)
Clinical Social Work returned patient's call.   Left voicemail with contact information and request for return call.

## 2023-06-07 ENCOUNTER — Inpatient Hospital Stay: Payer: Medicaid Other

## 2023-06-07 NOTE — Progress Notes (Signed)
[facility] CSW Progress Note  Clinical Child psychotherapist returned patients call.  Provided Angie with information forwarded by the Breast Cancer Nurse Navigator, Irving Shows.  Stated the medicaid should be renewed automatically, but to contact Joellyn Quails at [phone] for verification.    Dorothey Baseman, LCSW Clinical Social Worker [facility]    Patient is participating in a Managed Medicaid Plan:  Yes

## 2023-06-13 ENCOUNTER — Other Ambulatory Visit: Payer: Self-pay | Admitting: Oncology

## 2023-06-29 ENCOUNTER — Encounter: Payer: Self-pay | Admitting: Family

## 2023-06-29 DIAGNOSIS — R7303 Prediabetes: Secondary | ICD-10-CM | POA: Insufficient documentation

## 2023-06-29 DIAGNOSIS — E538 Deficiency of other specified B group vitamins: Secondary | ICD-10-CM | POA: Insufficient documentation

## 2023-06-29 DIAGNOSIS — E559 Vitamin D deficiency, unspecified: Secondary | ICD-10-CM | POA: Insufficient documentation

## 2023-06-29 DIAGNOSIS — E782 Mixed hyperlipidemia: Secondary | ICD-10-CM | POA: Insufficient documentation

## 2023-06-29 NOTE — Assessment & Plan Note (Signed)
Blood pressure well controlled with current medications.  Continue current therapy.  Will reassess at follow up.  

## 2023-06-29 NOTE — Assessment & Plan Note (Signed)

## 2023-06-29 NOTE — Assessment & Plan Note (Signed)
Checking labs today.  Will continue supplements as needed.  

## 2023-06-29 NOTE — Progress Notes (Signed)
Established Patient Office Visit  Subjective:  Patient ID: Kelsey Russo, female    DOB: 1965/09/11  Age: 57 y.o. MRN: [id number]  Chief Complaint  Patient presents with   Follow-up    3 mo f/u    Patient is here today for her 3 months follow up.  She has been feeling about the same since last appointment.   She does not have additional concerns to discuss today.  Labs are due today. She needs refills.   I have reviewed her active problem list, medication list, allergies, notes from last encounter, lab results for her appointment today.      No other concerns at this time.   Past Medical History:  Diagnosis Date   Anemia    during chemotherapy   Brain tumor (benign) (HCC)    Breast cancer (HCC) 1999   Status post right breast lumpectomy and radiation   Breast cancer (HCC) 11/2019   right breast   COPD (chronic obstructive pulmonary disease) (HCC)    Depression    History of chemotherapy 1999   History of seizure disorder    Hypertension    Personal history of chemotherapy    breast cancer 2021   Personal history of radiation therapy 1999   Pneumonia    PONV (postoperative nausea and vomiting)    vomiting    Past Surgical History:  Procedure Laterality Date   BRAIN SURGERY     Brain tumor resection  1991   BREAST BIOPSY Right 1999   Positive   BREAST BIOPSY Right 11/10/2019   [place] bx 10:00 heart marker, positive   BREAST BIOPSY Right 11/10/2019   [place] bx axilla, hydromark 4, positive   BREAST BIOPSY Left 11/10/2019   [place] bx 2:00, venus marker, neg   MASTECTOMY Right 07/13/2020   positive   PORTACATH PLACEMENT Left 11/30/2019   Procedure: INSERTION PORT-A-CATH;  Surgeon: Earline Mayotte, MD;  Location: [facility] ORS;  Service: General;  Laterality: Left;   Right breast lumpectomy     SIMPLE MASTECTOMY WITH AXILLARY SENTINEL NODE BIOPSY Right 07/13/2020   Procedure: SIMPLE MASTECTOMY WITH AXILLARY SENTINEL NODE BIOPSY;  Surgeon: Earline Mayotte, MD;   Location: [facility] ORS;  Service: General;  Laterality: Right;   TONSILLECTOMY     TUBAL LIGATION      Social History   Socioeconomic History   Marital status: Married    Spouse name: Not on file   Number of children: Not on file   Years of education: Not on file   Highest education level: Not on file  Occupational History   Not on file  Tobacco Use   Smoking status: Every Day    Current packs/day: 0.25    Average packs/day: 0.3 packs/day for 40.0 years (10.0 ttl pk-yrs)    Types: Cigarettes   Smokeless tobacco: Never  Vaping Use   Vaping status: Never Used  Substance and Sexual Activity   Alcohol use: No    Alcohol/week: 0.0 standard drinks of alcohol   Drug use: Yes    Frequency: 3.0 times per week    Types: Marijuana   Sexual activity: Not on file  Other Topics Concern   Not on file  Social History Narrative   Lives at home with husband.   Social Determinants of Health   Financial Resource Strain: Not on file  Food Insecurity: Not on file  Transportation Needs: Not on file  Physical Activity: Not on file  Stress: Not on file  Social Connections:  Not on file  Intimate Partner Violence: Not on file    Family History  Problem Relation Age of Onset   Depression Mother    CAD Father    Hypertension Father    Lung cancer Father    Coronary artery disease Father 54   Breast cancer Maternal Grandmother     Allergies  Allergen Reactions   Imitrex [Sumatriptan] Anaphylaxis   Penicillins Rash    Did it involve swelling of the face/tongue/throat, SOB, or low BP? Unknown Did it involve sudden or severe rash/hives, skin peeling, or any reaction on the inside of your mouth or nose? Unknown Did you need to seek medical attention at a hospital or doctor's office? Unknown When did it last happen? ? If all above answers are "NO", may proceed with cephalosporin use.     Review of Systems  Constitutional:  Positive for malaise/fatigue.  Musculoskeletal:  Positive for  myalgias.  All other systems reviewed and are negative.      Objective:   BP 105/70   Pulse 75   Ht 5\' 7"  (1.702 m)   Wt 138 lb (62.6 kg)   SpO2 98%   BMI 21.61 kg/m   Vitals:   05/30/23 1139  BP: 105/70  Pulse: 75  Height: 5\' 7"  (1.702 m)  Weight: 138 lb (62.6 kg)  SpO2: 98%  BMI (Calculated): 21.61    Physical Exam Vitals and nursing note reviewed.  Constitutional:      Appearance: Normal appearance. She is normal weight.  HENT:     Head: Normocephalic.  Eyes:     Pupils: Pupils are equal, round, and reactive to light.  Cardiovascular:     Rate and Rhythm: Normal rate.  Pulmonary:     Effort: Pulmonary effort is normal.  Musculoskeletal:     Lumbar back: Tenderness present. Positive right straight leg raise test and positive left straight leg raise test.  Neurological:     General: No focal deficit present.     Mental Status: She is alert and oriented to person, place, and time. Mental status is at baseline.  Psychiatric:        Mood and Affect: Mood normal.        Behavior: Behavior normal.        Thought Content: Thought content normal.        Judgment: Judgment normal.      Results for orders placed or performed in visit on 05/30/23  Lipid panel  Result Value Ref Range   Cholesterol, Total 169 100 - 199 mg/dL   Triglycerides 75 0 - 149 mg/dL   HDL 48 >60 mg/dL   VLDL Cholesterol Cal 14 5 - 40 mg/dL   LDL Chol Calc (NIH) 454 (H) 0 - 99 mg/dL   Chol/HDL Ratio 3.5 0.0 - 4.4 ratio  VITAMIN D 25 Hydroxy (Vit-D Deficiency, Fractures)  Result Value Ref Range   Vit D, 25-Hydroxy 25.4 (L) 30.0 - 100.0 ng/mL  CMP14+EGFR  Result Value Ref Range   Glucose 71 70 - 99 mg/dL   BUN 7 6 - 24 mg/dL   Creatinine, Ser 0.98 0.57 - 1.00 mg/dL   eGFR 66 >11 BJ/YNW/2.95   BUN/Creatinine Ratio 7 (L) 9 - 23   Sodium 137 134 - 144 mmol/L   Potassium 4.5 3.5 - 5.2 mmol/L   Chloride 102 96 - 106 mmol/L   CO2 22 20 - 29 mmol/L   Calcium 9.2 8.7 - 10.2 mg/dL   Total  Protein 6.4  6.0 - 8.5 g/dL   Albumin 4.1 3.8 - 4.9 g/dL   Globulin, Total 2.3 1.5 - 4.5 g/dL   Bilirubin Total 0.4 0.0 - 1.2 mg/dL   Alkaline Phosphatase 74 44 - 121 IU/L   AST 16 0 - 40 IU/L   ALT 10 0 - 32 IU/L  TSH  Result Value Ref Range   TSH 1.410 0.450 - 4.500 uIU/mL  Hemoglobin A1c  Result Value Ref Range   Hgb A1c MFr Bld 5.2 4.8 - 5.6 %   Est. average glucose Bld gHb Est-mCnc 103 mg/dL  Vitamin O13  Result Value Ref Range   Vitamin B-12 386 232 - 1,245 pg/mL    Recent Results (from the past 2160 hour(s))  Lipid panel     Status: Abnormal   Collection Time: 05/30/23 12:02 PM  Result Value Ref Range   Cholesterol, Total 169 100 - 199 mg/dL   Triglycerides 75 0 - 149 mg/dL   HDL 48 >08 mg/dL   VLDL Cholesterol Cal 14 5 - 40 mg/dL   LDL Chol Calc (NIH) 657 (H) 0 - 99 mg/dL   Chol/HDL Ratio 3.5 0.0 - 4.4 ratio    Comment:                                   T. Chol/HDL Ratio                                             Men  Women                               1/2 Avg.Risk  3.4    3.3                                   Avg.Risk  5.0    4.4                                2X Avg.Risk  9.6    7.1                                3X Avg.Risk 23.4   11.0   VITAMIN D 25 Hydroxy (Vit-D Deficiency, Fractures)     Status: Abnormal   Collection Time: 05/30/23 12:02 PM  Result Value Ref Range   Vit D, 25-Hydroxy 25.4 (L) 30.0 - 100.0 ng/mL    Comment: Vitamin D deficiency has been defined by the Institute of Medicine and an Endocrine Society practice guideline as a level of serum 25-OH vitamin D less than 20 ng/mL (1,2). The Endocrine Society went on to further define vitamin D insufficiency as a level between 21 and 29 ng/mL (2). 1. IOM (Institute of Medicine). 2010. Dietary reference    intakes for calcium and D. [place]: The    [employer]. 2. Holick MF, Binkley [place], Bischoff-Ferrari HA, et al.    Evaluation, treatment, and prevention of vitamin D     deficiency: an Endocrine Society clinical practice    guideline. JCEM. 2011 Jul; 96(7):1911-30.  CMP14+EGFR     Status: Abnormal   Collection Time: 05/30/23 12:02 PM  Result Value Ref Range   Glucose 71 70 - 99 mg/dL   BUN 7 6 - 24 mg/dL   Creatinine, Ser 0.86 0.57 - 1.00 mg/dL   eGFR 66 >57 QI/ONG/2.95   BUN/Creatinine Ratio 7 (L) 9 - 23   Sodium 137 134 - 144 mmol/L   Potassium 4.5 3.5 - 5.2 mmol/L   Chloride 102 96 - 106 mmol/L   CO2 22 20 - 29 mmol/L   Calcium 9.2 8.7 - 10.2 mg/dL   Total Protein 6.4 6.0 - 8.5 g/dL   Albumin 4.1 3.8 - 4.9 g/dL   Globulin, Total 2.3 1.5 - 4.5 g/dL   Bilirubin Total 0.4 0.0 - 1.2 mg/dL   Alkaline Phosphatase 74 44 - 121 IU/L   AST 16 0 - 40 IU/L   ALT 10 0 - 32 IU/L  TSH     Status: None   Collection Time: 05/30/23 12:02 PM  Result Value Ref Range   TSH 1.410 0.450 - 4.500 uIU/mL  Hemoglobin A1c     Status: None   Collection Time: 05/30/23 12:02 PM  Result Value Ref Range   Hgb A1c MFr Bld 5.2 4.8 - 5.6 %    Comment:          Prediabetes: 5.7 - 6.4          Diabetes: >6.4          Glycemic control for adults with diabetes: <7.0    Est. average glucose Bld gHb Est-mCnc 103 mg/dL  Vitamin M84     Status: None   Collection Time: 05/30/23 12:02 PM  Result Value Ref Range   Vitamin B-12 386 232 - 1,245 pg/mL       Assessment & Plan:   Problem List Items Addressed This Visit       Active Problems   Essential hypertension    Blood pressure well controlled with current medications.  Continue current therapy.  Will reassess at follow up.       Relevant Orders   CMP14+EGFR (Completed)   CBC with Differential/Platelet   Chronic obstructive pulmonary disease, unspecified COPD type (HCC)    Patient is seen by Pulmonology, who manage this condition.  She is well controlled with current therapy.   Will defer to them for further changes to plan of care.       Caregiver stress   Relevant Orders   CMP14+EGFR (Completed)   CBC with  Differential/Platelet   Vitamin D deficiency, unspecified   Relevant Orders   VITAMIN D 25 Hydroxy (Vit-D Deficiency, Fractures) (Completed)   CMP14+EGFR (Completed)   CBC with Differential/Platelet   B12 deficiency due to diet - Primary    Checking labs today.  Will continue supplements as needed.        Relevant Orders   CMP14+EGFR (Completed)   Vitamin B12 (Completed)   CBC with Differential/Platelet   Mixed hyperlipidemia    Checking labs today.  Continue current therapy for lipid control. Will modify as needed based on labwork results.        Relevant Orders   Lipid panel (Completed)   CMP14+EGFR (Completed)   CBC with Differential/Platelet   Prediabetes    Patient educated on foods that contain carbohydrates and the need to decrease intake.  We discussed prediabetes, and what it means and the need for strict dietary control to prevent progression to type 2 diabetes.  Advised to  decrease intake of sugary drinks, including sodas, sweet tea, and some juices, and of starch and sugar heavy foods (ie., potatoes, rice, bread, pasta, desserts). She verbalizes understanding and agreement with the changes discussed today.  A1C Continues to be in prediabetic ranges.  Will reassess at follow up after next lab check.  Patient counseled on dietary choices and verbalized understanding.        Relevant Orders   CMP14+EGFR (Completed)   Hemoglobin A1c (Completed)   CBC with Differential/Platelet   Other Visit Diagnoses     Other fatigue       Relevant Orders   CMP14+EGFR (Completed)   TSH (Completed)   CBC with Differential/Platelet       Return in about 3 months (around 08/29/2023).   Total time spent: 20 minutes  Miki Kins, FNP  05/30/2023   This document may have been prepared by [facility] Voice Recognition software and as such may include unintentional dictation errors.

## 2023-06-29 NOTE — Assessment & Plan Note (Signed)
 Patient is seen by Pulmonology, who manage this condition.  She is well controlled with current therapy.   Will defer to them for further changes to plan of care

## 2023-06-29 NOTE — Assessment & Plan Note (Signed)
Checking labs today.  Continue current therapy for lipid control. Will modify as needed based on labwork results.  

## 2023-07-01 ENCOUNTER — Other Ambulatory Visit: Payer: Self-pay | Admitting: Family

## 2023-07-03 ENCOUNTER — Other Ambulatory Visit: Payer: Self-pay | Admitting: Family

## 2023-07-29 ENCOUNTER — Other Ambulatory Visit: Payer: Self-pay | Admitting: Family

## 2023-08-05 ENCOUNTER — Other Ambulatory Visit: Payer: Self-pay | Admitting: Family

## 2023-08-16 ENCOUNTER — Other Ambulatory Visit: Payer: Self-pay | Admitting: Family

## 2023-08-24 ENCOUNTER — Other Ambulatory Visit: Payer: Self-pay | Admitting: Family

## 2023-08-26 ENCOUNTER — Ambulatory Visit: Payer: Medicaid Other | Admitting: Family

## 2023-08-26 VITALS — BP 115/70 | HR 87 | Ht 67.0 in | Wt 132.6 lb

## 2023-08-26 DIAGNOSIS — I10 Essential (primary) hypertension: Secondary | ICD-10-CM

## 2023-08-26 DIAGNOSIS — R55 Syncope and collapse: Secondary | ICD-10-CM

## 2023-08-26 DIAGNOSIS — F5101 Primary insomnia: Secondary | ICD-10-CM

## 2023-08-26 DIAGNOSIS — E538 Deficiency of other specified B group vitamins: Secondary | ICD-10-CM | POA: Diagnosis not present

## 2023-08-26 DIAGNOSIS — E782 Mixed hyperlipidemia: Secondary | ICD-10-CM | POA: Diagnosis not present

## 2023-08-26 DIAGNOSIS — J449 Chronic obstructive pulmonary disease, unspecified: Secondary | ICD-10-CM

## 2023-08-26 DIAGNOSIS — R5383 Other fatigue: Secondary | ICD-10-CM

## 2023-08-26 DIAGNOSIS — R7303 Prediabetes: Secondary | ICD-10-CM

## 2023-08-26 DIAGNOSIS — E559 Vitamin D deficiency, unspecified: Secondary | ICD-10-CM

## 2023-08-26 MED ORDER — BREZTRI AEROSPHERE 160-9-4.8 MCG/ACT IN AERO: 2.0000 | INHALATION_SPRAY | Freq: Two times a day (BID) | RESPIRATORY_TRACT | 5 refills | Status: DC

## 2023-08-26 MED ORDER — ESZOPICLONE 3 MG PO TABS: 3.0000 mg | ORAL_TABLET | Freq: Every day | ORAL | 3 refills | Status: DC

## 2023-08-26 MED ORDER — GABAPENTIN 300 MG PO CAPS: 300.0000 mg | ORAL_CAPSULE | Freq: Two times a day (BID) | ORAL | 1 refills | Status: DC

## 2023-08-26 MED ORDER — TRAMADOL HCL 50 MG PO TABS: ORAL_TABLET | ORAL | 1 refills | Status: DC

## 2023-08-26 MED ORDER — CLONAZEPAM 1 MG PO TABS: 1.0000 mg | ORAL_TABLET | Freq: Three times a day (TID) | ORAL | 2 refills | Status: DC | PRN

## 2023-08-26 MED ORDER — ALENDRONATE SODIUM 70 MG PO TABS: 70.0000 mg | ORAL_TABLET | ORAL | 3 refills | Status: DC

## 2023-08-26 MED ORDER — LOSARTAN POTASSIUM 50 MG PO TABS: 50.0000 mg | ORAL_TABLET | Freq: Every day | ORAL | 1 refills | Status: DC

## 2023-08-26 MED ORDER — SERTRALINE HCL 100 MG PO TABS: 200.0000 mg | ORAL_TABLET | Freq: Every day | ORAL | 0 refills | Status: DC

## 2023-08-26 MED ORDER — TIZANIDINE HCL 4 MG PO TABS: 4.0000 mg | ORAL_TABLET | Freq: Every day | ORAL | 3 refills | Status: DC | PRN

## 2023-08-26 NOTE — Assessment & Plan Note (Signed)
Checking labs today.  Will continue supplements as needed.  

## 2023-08-26 NOTE — Assessment & Plan Note (Signed)
Blood pressure well controlled with current medications.  Continue current therapy.  Will reassess at follow up.  

## 2023-08-26 NOTE — Progress Notes (Signed)
Established Patient Office Visit  Subjective:  Patient ID: Kelsey Russo, female    DOB: 1965/11/05  Age: 57 y.o. MRN: [id number]  Chief Complaint  Patient presents with   Follow-up    3 mo    Patient is here today for her 3 months follow up.  She has been feeling fairly well since last appointment.   She does have additional concerns to discuss today.  She has been having some dizziness and says that her legs have given out on her a few times.  She also says that she has not been able to get all of her Ambien, and she doesn't have any way to afford the other half of her RX, as her husband passed away and she does not have any additional income. Her insurance company will only allow her to get 15 at a time.   Labs are not due today. She needs refills.   I have reviewed her active problem list, medication list, allergies, health maintenance, notes from last encounter, lab results for her appointment today.      No other concerns at this time.   Past Medical History:  Diagnosis Date   Anemia    during chemotherapy   Brain tumor (benign) (HCC)    Breast cancer (HCC) 1999   Status post right breast lumpectomy and radiation   Breast cancer (HCC) 11/2019   right breast   COPD (chronic obstructive pulmonary disease) (HCC)    Depression    History of chemotherapy 1999   History of seizure disorder    Hypertension    Personal history of chemotherapy    breast cancer 2021   Personal history of radiation therapy 1999   Pneumonia    PONV (postoperative nausea and vomiting)    vomiting    Past Surgical History:  Procedure Laterality Date   BRAIN SURGERY     Brain tumor resection  1991   BREAST BIOPSY Right 1999   Positive   BREAST BIOPSY Right 11/10/2019   [place] bx 10:00 heart marker, positive   BREAST BIOPSY Right 11/10/2019   [place] bx axilla, hydromark 4, positive   BREAST BIOPSY Left 11/10/2019   [place] bx 2:00, venus marker, neg   MASTECTOMY Right 07/13/2020    positive   PORTACATH PLACEMENT Left 11/30/2019   Procedure: INSERTION PORT-A-CATH;  Surgeon: Earline Mayotte, MD;  Location: [facility];  Service: General;  Laterality: Left;   Right breast lumpectomy     SIMPLE MASTECTOMY WITH AXILLARY SENTINEL NODE BIOPSY Right 07/13/2020   Procedure: SIMPLE MASTECTOMY WITH AXILLARY SENTINEL NODE BIOPSY;  Surgeon: Earline Mayotte, MD;  Location: [facility];  Service: General;  Laterality: Right;   TONSILLECTOMY     TUBAL LIGATION      Social History   Socioeconomic History   Marital status: Married    Spouse name: Not on file   Number of children: Not on file   Years of education: Not on file   Highest education level: Not on file  Occupational History   Not on file  Tobacco Use   Smoking status: Every Day    Current packs/day: 0.25    Average packs/day: 0.3 packs/day for 40.0 years (10.0 ttl pk-yrs)    Types: Cigarettes   Smokeless tobacco: Never  Vaping Use   Vaping status: Never Used  Substance and Sexual Activity   Alcohol use: No    Alcohol/week: 0.0 standard drinks of alcohol   Drug use: Yes  Frequency: 3.0 times per week    Types: Marijuana   Sexual activity: Not on file  Other Topics Concern   Not on file  Social History Narrative   Lives at home with husband.   Social Drivers of Corporate investment banker Strain: Not on file  Food Insecurity: Not on file  Transportation Needs: Not on file  Physical Activity: Not on file  Stress: Not on file  Social Connections: Not on file  Intimate Partner Violence: Not on file    Family History  Problem Relation Age of Onset   Depression Mother    CAD Father    Hypertension Father    Lung cancer Father    Coronary artery disease Father 59   Breast cancer Maternal Grandmother     Allergies  Allergen Reactions   Imitrex [Sumatriptan] Anaphylaxis   Penicillins Rash    Did it involve swelling of the face/tongue/throat, SOB, or low BP? Unknown Did it involve sudden or  severe rash/hives, skin peeling, or any reaction on the inside of your mouth or nose? Unknown Did you need to seek medical attention at a hospital or doctor's office? Unknown When did it last happen? ? If all above answers are "NO", may proceed with cephalosporin use.     Review of Systems  Neurological:  Positive for dizziness and weakness.  Psychiatric/Behavioral:  The patient is nervous/anxious and has insomnia.   All other systems reviewed and are negative.      Objective:   BP 115/70   Pulse 87   Ht 5\' 7"  (1.702 m)   Wt 132 lb 9.6 oz (60.1 kg)   SpO2 91%   BMI 20.77 kg/m   Vitals:   08/26/23 1539  BP: 115/70  Pulse: 87  Height: 5\' 7"  (1.702 m)  Weight: 132 lb 9.6 oz (60.1 kg)  SpO2: 91%  BMI (Calculated): 20.76    Physical Exam Vitals and nursing note reviewed.  Constitutional:      Appearance: Normal appearance. She is normal weight.  HENT:     Head: Normocephalic.  Eyes:     Extraocular Movements: Extraocular movements intact.     Conjunctiva/sclera: Conjunctivae normal.     Pupils: Pupils are equal, round, and reactive to light.  Cardiovascular:     Rate and Rhythm: Normal rate.  Pulmonary:     Effort: Pulmonary effort is normal.  Neurological:     General: No focal deficit present.     Mental Status: She is alert and oriented to person, place, and time. Mental status is at baseline.     Motor: Weakness present.  Psychiatric:        Mood and Affect: Mood normal.        Behavior: Behavior normal.        Thought Content: Thought content normal.        Judgment: Judgment normal.      Results for orders placed or performed in visit on 08/26/23  CBC with Diff  Result Value Ref Range   WBC 11.3 (H) 3.4 - 10.8 x10E3/uL   RBC 4.82 3.77 - 5.28 x10E6/uL   Hemoglobin 14.4 11.1 - 15.9 g/dL   Hematocrit 86.5 78.4 - 46.6 %   MCV 91 79 - 97 fL   MCH 29.9 26.6 - 33.0 pg   MCHC 33.0 31.5 - 35.7 g/dL   RDW 69.6 29.5 - 28.4 %   Platelets 383 150 - 450  x10E3/uL   Neutrophils 76 Not Estab. %  Lymphs 17 Not Estab. %   Monocytes 4 Not Estab. %   Eos 1 Not Estab. %   Basos 1 Not Estab. %   Neutrophils Absolute 8.7 (H) 1.4 - 7.0 x10E3/uL   Lymphocytes Absolute 1.9 0.7 - 3.1 x10E3/uL   Monocytes Absolute 0.4 0.1 - 0.9 x10E3/uL   EOS (ABSOLUTE) 0.1 0.0 - 0.4 x10E3/uL   Basophils Absolute 0.1 0.0 - 0.2 x10E3/uL   Immature Granulocytes 1 Not Estab. %   Immature Grans (Abs) 0.1 0.0 - 0.1 x10E3/uL  CMP14+EGFR  Result Value Ref Range   Glucose 83 70 - 99 mg/dL   BUN 12 6 - 24 mg/dL   Creatinine, Ser 4.09 0.57 - 1.00 mg/dL   eGFR 67 >81 XB/JYN/8.29   BUN/Creatinine Ratio 12 9 - 23   Sodium 136 134 - 144 mmol/L   Potassium 4.7 3.5 - 5.2 mmol/L   Chloride 101 96 - 106 mmol/L   CO2 21 20 - 29 mmol/L   Calcium 9.6 8.7 - 10.2 mg/dL   Total Protein 7.0 6.0 - 8.5 g/dL   Albumin 4.5 3.8 - 4.9 g/dL   Globulin, Total 2.5 1.5 - 4.5 g/dL   Bilirubin Total 0.4 0.0 - 1.2 mg/dL   Alkaline Phosphatase 80 44 - 121 IU/L   AST 19 0 - 40 IU/L   ALT 11 0 - 32 IU/L  Vitamin B12  Result Value Ref Range   Vitamin B-12 761 232 - 1,245 pg/mL  Iron, TIBC and Ferritin Panel  Result Value Ref Range   Total Iron Binding Capacity 295 250 - 450 ug/dL   UIBC 562 130 - 865 ug/dL   Iron 55 27 - 784 ug/dL   Iron Saturation 19 15 - 55 %   Ferritin 125 15 - 150 ng/mL    Recent Results (from the past 2160 hours)  CBC with Diff     Status: Abnormal   Collection Time: 08/26/23  4:02 PM  Result Value Ref Range   WBC 11.3 (H) 3.4 - 10.8 x10E3/uL   RBC 4.82 3.77 - 5.28 x10E6/uL   Hemoglobin 14.4 11.1 - 15.9 g/dL   Hematocrit 69.6 29.5 - 46.6 %   MCV 91 79 - 97 fL   MCH 29.9 26.6 - 33.0 pg   MCHC 33.0 31.5 - 35.7 g/dL   RDW 28.4 13.2 - 44.0 %   Platelets 383 150 - 450 x10E3/uL   Neutrophils 76 Not Estab. %   Lymphs 17 Not Estab. %   Monocytes 4 Not Estab. %   Eos 1 Not Estab. %   Basos 1 Not Estab. %   Neutrophils Absolute 8.7 (H) 1.4 - 7.0 x10E3/uL    Lymphocytes Absolute 1.9 0.7 - 3.1 x10E3/uL   Monocytes Absolute 0.4 0.1 - 0.9 x10E3/uL   EOS (ABSOLUTE) 0.1 0.0 - 0.4 x10E3/uL   Basophils Absolute 0.1 0.0 - 0.2 x10E3/uL   Immature Granulocytes 1 Not Estab. %   Immature Grans (Abs) 0.1 0.0 - 0.1 x10E3/uL  CMP14+EGFR     Status: None   Collection Time: 08/26/23  4:02 PM  Result Value Ref Range   Glucose 83 70 - 99 mg/dL   BUN 12 6 - 24 mg/dL   Creatinine, Ser 1.02 0.57 - 1.00 mg/dL   eGFR 67 >72 ZD/GUY/4.03   BUN/Creatinine Ratio 12 9 - 23   Sodium 136 134 - 144 mmol/L   Potassium 4.7 3.5 - 5.2 mmol/L   Chloride 101 96 - 106 mmol/L  CO2 21 20 - 29 mmol/L   Calcium 9.6 8.7 - 10.2 mg/dL   Total Protein 7.0 6.0 - 8.5 g/dL   Albumin 4.5 3.8 - 4.9 g/dL   Globulin, Total 2.5 1.5 - 4.5 g/dL   Bilirubin Total 0.4 0.0 - 1.2 mg/dL   Alkaline Phosphatase 80 44 - 121 IU/L   AST 19 0 - 40 IU/L   ALT 11 0 - 32 IU/L  Vitamin B12     Status: None   Collection Time: 08/26/23  4:02 PM  Result Value Ref Range   Vitamin B-12 761 232 - 1,245 pg/mL  Iron, TIBC and Ferritin Panel     Status: None   Collection Time: 08/26/23  4:02 PM  Result Value Ref Range   Total Iron Binding Capacity 295 250 - 450 ug/dL   UIBC 119 147 - 829 ug/dL   Iron 55 27 - 562 ug/dL   Iron Saturation 19 15 - 55 %   Ferritin 125 15 - 150 ng/mL  Vitamin D 25 hydroxy     Status: None   Collection Time: 08/30/23 10:24 AM  Result Value Ref Range   Vit D, 25-Hydroxy 39.38 30 - 100 ng/mL    Comment: (NOTE) Vitamin D deficiency has been defined by the Institute of Medicine  and an Endocrine Society practice guideline as a level of serum 25-OH  vitamin D less than 20 ng/mL (1,2). The Endocrine Society went on to  further define vitamin D insufficiency as a level between 21 and 29  ng/mL (2).  1. IOM (Institute of Medicine). 2010. Dietary reference intakes for  calcium and D. [place]: [employer]. 2. Holick MF, Binkley [place], Bischoff-Ferrari HA, et al.  Evaluation,  treatment, and prevention of vitamin D deficiency: an Endocrine  Society clinical practice guideline, JCEM. 2011 Jul; 96(7): 1911-30.  Performed at [facility], [address] [place], [place] [address]   CMP (Cancer Center only)     Status: Abnormal   Collection Time: 08/30/23 10:24 AM  Result Value Ref Range   Sodium 132 (L) 135 - 145 mmol/L   Potassium 3.8 3.5 - 5.1 mmol/L   Chloride 102 98 - 111 mmol/L   CO2 21 (L) 22 - 32 mmol/L   Glucose, Bld 165 (H) 70 - 99 mg/dL    Comment: Glucose reference range applies only to samples taken after fasting for at least 8 hours.   BUN 16 6 - 20 mg/dL   Creatinine 5.78 4.69 - 1.00 mg/dL   Calcium 9.2 8.9 - 62.9 mg/dL   Total Protein 7.1 6.5 - 8.1 g/dL   Albumin 4.0 3.5 - 5.0 g/dL   AST 25 15 - 41 U/L   ALT 14 0 - 44 U/L   Alkaline Phosphatase 50 38 - 126 U/L   Total Bilirubin 0.6 <1.2 mg/dL   GFR, Estimated >52 >84 mL/min    Comment: (NOTE) Calculated using the CKD-EPI Creatinine Equation (2021)    Anion gap 9 5 - 15    Comment: Performed at [facility], [address] [place], [place] [address]  CBC with Differential (Cancer Center Only)     Status: None   Collection Time: 08/30/23 10:24 AM  Result Value Ref Range   WBC Count 6.6 4.0 - 10.5 K/uL   RBC 4.33 3.87 - 5.11 MIL/uL   Hemoglobin 12.8 12.0 - 15.0 g/dL   HCT 01.0 27.2 - 53.6 %   MCV 89.8 80.0 -  100.0 fL   MCH 29.6 26.0 - 34.0 pg   MCHC 32.9 30.0 - 36.0 g/dL   RDW 57.8 46.9 - 62.9 %   Platelet Count 294 150 - 400 K/uL   nRBC 0.0 0.0 - 0.2 %   Neutrophils Relative % 65 %   Neutro Abs 4.3 1.7 - 7.7 K/uL   Lymphocytes Relative 25 %   Lymphs Abs 1.6 0.7 - 4.0 K/uL   Monocytes Relative 6 %   Monocytes Absolute 0.4 0.1 - 1.0 K/uL   Eosinophils Relative 2 %   Eosinophils Absolute 0.1 0.0 - 0.5 K/uL   Basophils Relative 1 %   Basophils Absolute 0.1 0.0 - 0.1 K/uL   Immature Granulocytes 1 %   Abs Immature Granulocytes 0.04 0.00 - 0.07 K/uL     Comment: Performed at [facility], [address] [place], [place] [address]       Assessment & Plan:   Problem List Items Addressed This Visit       Cardiovascular and Mediastinum   Essential hypertension   Blood pressure well controlled with current medications.  Continue current therapy.  Will reassess at follow up.        Relevant Medications   losartan (COZAAR) 50 MG tablet     Respiratory   Chronic obstructive pulmonary disease, unspecified COPD type (HCC)   Patient stable.  Well controlled with current therapy.   Continue current meds.       Relevant Medications   BREZTRI AEROSPHERE 160-9-4.8 MCG/ACT AERO     Other   Syncope and collapse   Checking iron, vitamin B12, and CBC today.  Will call with results.       Relevant Orders   CBC with Diff (Completed)   CMP14+EGFR (Completed)   Vitamin B12 (Completed)   Iron, TIBC and Ferritin Panel (Completed)   Vitamin D deficiency, unspecified - Primary   Checking labs today.  Will continue supplements as needed.        Relevant Orders   CBC with Diff (Completed)   CMP14+EGFR (Completed)   Iron, TIBC and Ferritin Panel (Completed)   B12 deficiency due to diet   Checking labs today.  Will continue supplements as needed.        Relevant Orders   CBC with Diff (Completed)   CMP14+EGFR (Completed)   Vitamin B12 (Completed)   Iron, TIBC and Ferritin Panel (Completed)   Mixed hyperlipidemia   .Checking labs today.  Continue current therapy for lipid control. Will modify as needed based on labwork results.        Relevant Medications   losartan (COZAAR) 50 MG tablet   Prediabetes   A1C Continues to be in prediabetic ranges.  Will reassess at follow up after next lab check.  Patient counseled on dietary choices and verbalized understanding.        Primary insomnia   Given difficulties with obtaining her Ambien, will try a different medication.  She will let me know if this is working for  her or not.   Will adjust as needed.      Other Visit Diagnoses       Other fatigue       Relevant Orders   CBC with Diff (Completed)   CMP14+EGFR (Completed)   Vitamin B12 (Completed)   Iron, TIBC and Ferritin Panel (Completed)      If labs are normal will consider imaging of her spine, so that we can be sure her weakness is not coming  from her spine.    Return in about 3 months (around 11/24/2023).   Total time spent: 30 minutes  Miki Kins, FNP  08/26/2023   This document may have been prepared by [facility] Voice Recognition software and as such may include unintentional dictation errors.

## 2023-08-26 NOTE — Assessment & Plan Note (Signed)
Patient stable.  Well controlled with current therapy.   Continue current meds.  

## 2023-08-26 NOTE — Assessment & Plan Note (Signed)
A1C Continues to be in prediabetic ranges.  Will reassess at follow up after next lab check.  Patient counseled on dietary choices and verbalized understanding.   

## 2023-08-26 NOTE — Assessment & Plan Note (Signed)
Checking labs today.  Continue current therapy for lipid control. Will modify as needed based on labwork results.  

## 2023-08-26 NOTE — Assessment & Plan Note (Addendum)
Checking iron, vitamin B12, and CBC today.  Will call with results.

## 2023-08-27 LAB — CBC WITH DIFFERENTIAL/PLATELET
Basophils Absolute: 0.1 10*3/uL (ref 0.0–0.2)
Basos: 1 %
EOS (ABSOLUTE): 0.1 10*3/uL (ref 0.0–0.4)
Eos: 1 %
Hematocrit: 43.7 % (ref 34.0–46.6)
Hemoglobin: 14.4 g/dL (ref 11.1–15.9)
Immature Grans (Abs): 0.1 10*3/uL (ref 0.0–0.1)
Immature Granulocytes: 1 %
Lymphocytes Absolute: 1.9 10*3/uL (ref 0.7–3.1)
Lymphs: 17 %
MCH: 29.9 pg (ref 26.6–33.0)
MCHC: 33 g/dL (ref 31.5–35.7)
MCV: 91 fL (ref 79–97)
Monocytes Absolute: 0.4 10*3/uL (ref 0.1–0.9)
Monocytes: 4 %
Neutrophils Absolute: 8.7 10*3/uL — ABNORMAL HIGH (ref 1.4–7.0)
Neutrophils: 76 %
Platelets: 383 10*3/uL (ref 150–450)
RBC: 4.82 x10E6/uL (ref 3.77–5.28)
RDW: 13.6 % (ref 11.7–15.4)
WBC: 11.3 10*3/uL — ABNORMAL HIGH (ref 3.4–10.8)

## 2023-08-27 LAB — VITAMIN B12: Vitamin B-12: 761 pg/mL (ref 232–1245)

## 2023-08-27 LAB — IRON,TIBC AND FERRITIN PANEL
Ferritin: 125 ng/mL (ref 15–150)
Iron Saturation: 19 % (ref 15–55)
Iron: 55 ug/dL (ref 27–159)
Total Iron Binding Capacity: 295 ug/dL (ref 250–450)
UIBC: 240 ug/dL (ref 131–425)

## 2023-08-27 LAB — CMP14+EGFR
ALT: 11 [IU]/L (ref 0–32)
AST: 19 [IU]/L (ref 0–40)
Albumin: 4.5 g/dL (ref 3.8–4.9)
Alkaline Phosphatase: 80 [IU]/L (ref 44–121)
BUN/Creatinine Ratio: 12 (ref 9–23)
BUN: 12 mg/dL (ref 6–24)
Bilirubin Total: 0.4 mg/dL (ref 0.0–1.2)
CO2: 21 mmol/L (ref 20–29)
Calcium: 9.6 mg/dL (ref 8.7–10.2)
Chloride: 101 mmol/L (ref 96–106)
Creatinine, Ser: 0.97 mg/dL (ref 0.57–1.00)
Globulin, Total: 2.5 g/dL (ref 1.5–4.5)
Glucose: 83 mg/dL (ref 70–99)
Potassium: 4.7 mmol/L (ref 3.5–5.2)
Sodium: 136 mmol/L (ref 134–144)
Total Protein: 7 g/dL (ref 6.0–8.5)
eGFR: 67 mL/min/{1.73_m2} (ref >59)

## 2023-08-29 ENCOUNTER — Telehealth: Payer: Self-pay | Admitting: Family

## 2023-08-29 NOTE — Telephone Encounter (Signed)
Patient called in c/o her right shoulder pain and would like a referral to ortho. Advised her if it became something she couldn't wait for then she could utilize [facility] and their walk-in services.

## 2023-08-30 ENCOUNTER — Other Ambulatory Visit: Payer: Self-pay | Admitting: Family

## 2023-08-30 ENCOUNTER — Telehealth: Payer: Self-pay | Admitting: Family

## 2023-08-30 ENCOUNTER — Ambulatory Visit: Payer: Medicaid Other | Admitting: Family

## 2023-08-30 ENCOUNTER — Inpatient Hospital Stay: Payer: Medicaid Other | Attending: Oncology

## 2023-08-30 ENCOUNTER — Inpatient Hospital Stay (HOSPITAL_BASED_OUTPATIENT_CLINIC_OR_DEPARTMENT_OTHER): Payer: Medicaid Other | Admitting: Oncology

## 2023-08-30 VITALS — BP 84/60 | HR 71 | Temp 97.0°F | Resp 16 | Ht 67.0 in | Wt 132.1 lb

## 2023-08-30 DIAGNOSIS — F17210 Nicotine dependence, cigarettes, uncomplicated: Secondary | ICD-10-CM | POA: Diagnosis not present

## 2023-08-30 DIAGNOSIS — M818 Other osteoporosis without current pathological fracture: Secondary | ICD-10-CM

## 2023-08-30 DIAGNOSIS — M810 Age-related osteoporosis without current pathological fracture: Secondary | ICD-10-CM | POA: Insufficient documentation

## 2023-08-30 DIAGNOSIS — C50411 Malignant neoplasm of upper-outer quadrant of right female breast: Secondary | ICD-10-CM | POA: Insufficient documentation

## 2023-08-30 DIAGNOSIS — F32A Depression, unspecified: Secondary | ICD-10-CM | POA: Diagnosis not present

## 2023-08-30 DIAGNOSIS — Z923 Personal history of irradiation: Secondary | ICD-10-CM | POA: Insufficient documentation

## 2023-08-30 DIAGNOSIS — Z170 Estrogen receptor positive status [ER+]: Secondary | ICD-10-CM | POA: Diagnosis not present

## 2023-08-30 DIAGNOSIS — Z79810 Long term (current) use of selective estrogen receptor modulators (SERMs): Secondary | ICD-10-CM | POA: Insufficient documentation

## 2023-08-30 DIAGNOSIS — Z9221 Personal history of antineoplastic chemotherapy: Secondary | ICD-10-CM | POA: Insufficient documentation

## 2023-08-30 DIAGNOSIS — Z79899 Other long term (current) drug therapy: Secondary | ICD-10-CM | POA: Insufficient documentation

## 2023-08-30 LAB — CBC WITH DIFFERENTIAL (CANCER CENTER ONLY)
Abs Immature Granulocytes: 0.04 10*3/uL (ref 0.00–0.07)
Basophils Absolute: 0.1 10*3/uL (ref 0.0–0.1)
Basophils Relative: 1 %
Eosinophils Absolute: 0.1 10*3/uL (ref 0.0–0.5)
Eosinophils Relative: 2 %
HCT: 38.9 % (ref 36.0–46.0)
Hemoglobin: 12.8 g/dL (ref 12.0–15.0)
Immature Granulocytes: 1 %
Lymphocytes Relative: 25 %
Lymphs Abs: 1.6 10*3/uL (ref 0.7–4.0)
MCH: 29.6 pg (ref 26.0–34.0)
MCHC: 32.9 g/dL (ref 30.0–36.0)
MCV: 89.8 fL (ref 80.0–100.0)
Monocytes Absolute: 0.4 10*3/uL (ref 0.1–1.0)
Monocytes Relative: 6 %
Neutro Abs: 4.3 10*3/uL (ref 1.7–7.7)
Neutrophils Relative %: 65 %
Platelet Count: 294 10*3/uL (ref 150–400)
RBC: 4.33 MIL/uL (ref 3.87–5.11)
RDW: 14.1 % (ref 11.5–15.5)
WBC Count: 6.6 10*3/uL (ref 4.0–10.5)
nRBC: 0 % (ref 0.0–0.2)

## 2023-08-30 LAB — CMP (CANCER CENTER ONLY)
ALT: 14 U/L (ref 0–44)
AST: 25 U/L (ref 15–41)
Albumin: 4 g/dL (ref 3.5–5.0)
Alkaline Phosphatase: 50 U/L (ref 38–126)
Anion gap: 9 (ref 5–15)
BUN: 16 mg/dL (ref 6–20)
CO2: 21 mmol/L — ABNORMAL LOW (ref 22–32)
Calcium: 9.2 mg/dL (ref 8.9–10.3)
Chloride: 102 mmol/L (ref 98–111)
Creatinine: 0.95 mg/dL (ref 0.44–1.00)
GFR, Estimated: >60 mL/min (ref >60)
Glucose, Bld: 165 mg/dL — ABNORMAL HIGH (ref 70–99)
Potassium: 3.8 mmol/L (ref 3.5–5.1)
Sodium: 132 mmol/L — ABNORMAL LOW (ref 135–145)
Total Bilirubin: 0.6 mg/dL (ref <1.2)
Total Protein: 7.1 g/dL (ref 6.5–8.1)

## 2023-08-30 LAB — VITAMIN D 25 HYDROXY (VIT D DEFICIENCY, FRACTURES): Vit D, 25-Hydroxy: 39.38 ng/mL (ref 30–100)

## 2023-08-30 NOTE — Telephone Encounter (Signed)
Chris from [facility] called in to see which sleep medication/s the patient should currently be taking. Please advise.

## 2023-08-30 NOTE — Progress Notes (Signed)
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  INTERVAL HISTORY: Patient returns to clinic today for routine 106-month evaluation.  She has increased depression/grief secondary to the recent death of her husband this past October.  She otherwise feels well.  She continues to tolerate tamoxifen and Fosamax without significant side effects.  She has no neurologic complaints.  She does not complain of weakness or fatigue today.  She has a good appetite.  She denies any recent fevers or illnesses. She has no chest pain, shortness of breath, cough, or hemoptysis.  She denies any nausea, vomiting, constipation, or diarrhea.  She has no urinary complaints.  Patient offers no further specific complaints today.  REVIEW OF SYSTEMS:   Review of Systems  Constitutional: Negative.  Negative for fever, malaise/fatigue and weight loss.  HENT:  Negative for sore throat.   Respiratory: Negative.  Negative for cough, hemoptysis and shortness of breath.   Cardiovascular: Negative.  Negative for chest pain, palpitations and leg swelling.  Gastrointestinal:  Negative for abdominal pain, diarrhea, nausea and vomiting.  Genitourinary: Negative.  Negative for dysuria.  Musculoskeletal: Negative.  Negative for back pain and myalgias.  Skin: Negative.  Negative for rash.  Neurological: Negative.  Negative for dizziness, sensory change, weakness and headaches.   Psychiatric/Behavioral:  Positive for depression. The patient is not nervous/anxious and does not have insomnia.     As per HPI. Otherwise, a complete review of systems is negative.  PAST MEDICAL HISTORY: Past Medical History:  Diagnosis Date   Anemia    during chemotherapy   Brain tumor (benign) (HCC)    Breast cancer (HCC) 1999   Status post right breast lumpectomy and radiation   Breast cancer (HCC) 11/2019   right breast   COPD (chronic obstructive pulmonary disease) (HCC)    Depression    History of chemotherapy 1999   History of seizure disorder    Hypertension    Personal history of chemotherapy    breast cancer 2021   Personal history of radiation therapy 1999   Pneumonia    PONV (postoperative nausea and vomiting)    vomiting    PAST SURGICAL HISTORY: Past Surgical History:  Procedure Laterality Date   BRAIN SURGERY     Brain tumor resection  1991   BREAST BIOPSY Right 1999   Positive   BREAST BIOPSY Right 11/10/2019   [place] bx 10:00 heart marker, positive   BREAST BIOPSY Right 11/10/2019   [place] bx axilla, hydromark 4, positive   BREAST BIOPSY Left 11/10/2019   [place] bx 2:00, venus marker, neg   MASTECTOMY Right 07/13/2020   positive   PORTACATH PLACEMENT Left 11/30/2019   Procedure: INSERTION PORT-A-CATH;  Surgeon: Earline Mayotte, MD;  Location: [facility] ORS;  Service: General;  Laterality: Left;   Right breast lumpectomy     SIMPLE MASTECTOMY WITH AXILLARY SENTINEL NODE BIOPSY Right 07/13/2020   Procedure: SIMPLE MASTECTOMY WITH AXILLARY SENTINEL NODE BIOPSY;  Surgeon: Earline Mayotte, MD;  Location: [facility] ORS;  Service: General;  Laterality:  Right;   TONSILLECTOMY     TUBAL LIGATION      FAMILY HISTORY: Family History  Problem Relation Age of Onset   Depression Mother    CAD Father    Hypertension Father    Lung cancer Father    Coronary artery disease Father 60   Breast cancer Maternal Grandmother     ADVANCED DIRECTIVES (Y/N):  N  HEALTH  MAINTENANCE: Social History   Tobacco Use   Smoking status: Every Day    Current packs/day: 0.25    Average packs/day: 0.3 packs/day for 40.0 years (10.0 ttl pk-yrs)    Types: Cigarettes   Smokeless tobacco: Never  Vaping Use   Vaping status: Never Used  Substance Use Topics   Alcohol use: No    Alcohol/week: 0.0 standard drinks of alcohol   Drug use: Yes    Frequency: 3.0 times per week    Types: Marijuana     Colonoscopy:  PAP:  Bone density:  Lipid panel:  Allergies  Allergen Reactions   Imitrex [Sumatriptan] Anaphylaxis   Penicillins Rash    Did it involve swelling of the face/tongue/throat, SOB, or low BP? Unknown Did it involve sudden or severe rash/hives, skin peeling, or any reaction on the inside of your mouth or nose? Unknown Did you need to seek medical attention at a hospital or doctor's office? Unknown When did it last happen? ? If all above answers are "NO", may proceed with cephalosporin use.     Current Outpatient Medications  Medication Sig Dispense Refill   albuterol (VENTOLIN HFA) 108 (90 Base) MCG/ACT inhaler Inhale 1-2 puffs into the lungs every 6 (six) hours as needed for wheezing or shortness of breath.     alendronate (FOSAMAX) 70 MG tablet Take 1 tablet (70 mg total) by mouth once a week. Take with a full glass of water on an empty stomach. 4 tablet 3   BREZTRI AEROSPHERE 160-9-4.8 MCG/ACT AERO Inhale 2 puffs into the lungs in the morning and at bedtime. 10.7 g 5   clonazePAM (KLONOPIN) 1 MG tablet Take 1 tablet (1 mg total) by mouth 3 (three) times daily as needed for anxiety. 90 tablet 2   clotrimazole (MYCELEX) 10 MG troche TAKE ONE TABLET (10 MG TOTAL) BY MOUTH FIVE (FIVE) TIMES DAILY FOR SEVEN DAYS. 35 Troche 0   cyanocobalamin (VITAMIN B12) 1000 MCG/ML injection Inject 1 mL (1,000 mcg total) into the muscle every 30 (thirty) days. 12 mL 0   Eszopiclone 3 MG TABS Take 1 tablet (3 mg total) by mouth at bedtime. Take immediately before bedtime  30 tablet 3   gabapentin (NEURONTIN) 300 MG capsule Take 1 capsule (300 mg total) by mouth 2 (two) times daily. 90 capsule 1   hydrOXYzine (ATARAX) 25 MG tablet TAKE ONE TABLET BY MOUTH UP TO THREE TIMES DAILY AS NEEDED FOR ITCHING 90 tablet 3   losartan (COZAAR) 50 MG tablet Take 1 tablet (50 mg total) by mouth daily. 90 tablet 1   methocarbamol (ROBAXIN) 500 MG tablet TAKE ONE TABLET BY MOUTH EVERY SIX HOURS AS NEEDED FOR MUSCLE SPASMS 120 tablet 0   prochlorperazine (COMPAZINE) 10 MG tablet Take 10 mg by mouth every 8 (eight) hours as needed.     sertraline (ZOLOFT) 100 MG tablet Take 2 tablets (200 mg total) by mouth daily. 180 tablet 0   SYRINGE-NEEDLE, DISP, 3 ML (LUER LOCK SAFETY SYRINGES) 21G X 1-1/2" 3 ML MISC Use to inject B12 into muscle once a month 50  each 0   tamoxifen (NOLVADEX) 20 MG tablet TAKE ONE TABLET BY MOUTH ONCE A DAY 30 tablet 3   tiZANidine (ZANAFLEX) 4 MG tablet Take 1 tablet (4 mg total) by mouth daily as needed for muscle spasms. 90 tablet 3   traMADol (ULTRAM) 50 MG tablet TAKE TWO TABLETS BY MOUTH TWICE DAILY AS NEEDED FOR SEVERE PAIN (NOTE DOSE INCREASE) 120 tablet 1   traZODone (DESYREL) 50 MG tablet TAKE ONE TO TWO TABLETS BY MOUTH EACH NIGHT AT BEDTIME AS NEEDED FOR INSOMNIA 180 tablet 3   varenicline (CHANTIX) 1 MG tablet TAKE ONE TABLET (1 MG TOTAL) BY MOUTH TWO TIMES DAILY. 60 tablet 2   zolpidem (AMBIEN) 10 MG tablet Take 1 tablet (10 mg total) by mouth at bedtime as needed for sleep. (Patient not taking: Reported on 08/30/2023) 30 tablet 2   No current facility-administered medications for this visit.    OBJECTIVE: Vitals:   08/30/23 1035  BP: (!) 84/60  Pulse: 71  Resp: 16  Temp: (!) 97 F (36.1 C)  SpO2: 99%     Body mass index is 20.69 kg/m.    ECOG FS:0 - Asymptomatic  General: Well-developed, well-nourished, no acute distress. Eyes: Pink conjunctiva, anicteric sclera. HEENT: Normocephalic, moist mucous membranes. Lungs: No audible wheezing  or coughing. Heart: Regular rate and rhythm. Abdomen: Soft, nontender, no obvious distention. Musculoskeletal: No edema, cyanosis, or clubbing. Neuro: Alert, answering all questions appropriately. Cranial nerves grossly intact. Skin: No rashes or petechiae noted. Psych: Flat affect.   LAB RESULTS:  Lab Results  Component Value Date   NA 132 (L) 08/30/2023   K 3.8 08/30/2023   CL 102 08/30/2023   CO2 21 (L) 08/30/2023   GLUCOSE 165 (H) 08/30/2023   BUN 16 08/30/2023   CREATININE 0.95 08/30/2023   CALCIUM 9.2 08/30/2023   PROT 7.1 08/30/2023   ALBUMIN 4.0 08/30/2023   AST 25 08/30/2023   ALT 14 08/30/2023   ALKPHOS 50 08/30/2023   BILITOT 0.6 08/30/2023   GFRNONAA >60 08/30/2023   GFRAA >60 04/20/2020    Lab Results  Component Value Date   WBC 6.6 08/30/2023   NEUTROABS 4.3 08/30/2023   HGB 12.8 08/30/2023   HCT 38.9 08/30/2023   MCV 89.8 08/30/2023   PLT 294 08/30/2023     STUDIES: No results found.  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 She completed 4 cycles of Adriamycin and Cytoxan.  She only received 10 infusions of weekly Taxol her last one occurring on April 06, 2020.  Given her persistently decreased performance status and worsening neuropathy, treatment was discontinued. She underwent simple mastectomy on July 13, 2020 with significant improvement of her disease burden.  She did not require XRT given her simple mastectomy.   Her most recent CT scan on December 05, 2020 did not reveal any evidence of recurrent or progressive disease.  Letrozole was discontinued secondary to osteoporosis and patient was initiated on tamoxifen which she will require to take for a minimum of 5 years completing  treatment in December 2026. Given her high risk disease, could possibly extend treatment for 7 to 10 years.  Patient's most recent left breast screening mammogram on Jan 14, 2023 was reported as BI-RADS 1.  Repeat  in May 2025.  Return to clinic approximately 1 week after her next mammogram.   History of DCIS: Status post lumpectomy and XRT.  Patient reports she did not take any adjuvant hormonal therapy.  Given this history, previously referred patient to genetic counseling for evaluation and testing. Osteoporosis: Patient's most recent bone mineral density on Jan 14, 2023 revealed mild improvement of her T-score to -2.5.  Previously her T-score was reported -2.7.  Continue Fosamax, calcium, and vitamin D supplement.  Repeat in May 2025 along with mammogram as above. Back pain: Patient does not complain of this today.  Patient was instructed to use ibuprofen sparingly.  Continue tramadol as needed.   Depression: Continue current medications as prescribed.   Patient expressed understanding and was in agreement with this plan. She also understands that She can call clinic at any time with any questions, concerns, or complaints.    Cancer Staging  Primary cancer of upper outer quadrant of right female breast [facility]) Staging form: Breast, AJCC 8th Edition - Clinical stage from 11/26/2019: Stage IIIA (cT2, cN2a, cM0, G3, ER+, PR+, HER2-) - Signed by Jeralyn Ruths, MD on 11/26/2019 Stage prefix: Initial diagnosis Histologic grading system: 3 grade system   Jeralyn Ruths, MD   08/30/2023 1:37 PM

## 2023-08-31 ENCOUNTER — Encounter: Payer: Self-pay | Admitting: Family

## 2023-08-31 DIAGNOSIS — F5101 Primary insomnia: Secondary | ICD-10-CM | POA: Insufficient documentation

## 2023-08-31 NOTE — Assessment & Plan Note (Addendum)
Given difficulties with obtaining her Ambien, will try a different medication.  She will let me know if this is working for her or not.   Will adjust as needed.

## 2023-09-02 NOTE — Telephone Encounter (Signed)
Kelsey Russo from [facility] left VM to see if patient should still be taking trazodone. I spoke with Marchelle Folks and the patient should be taking trazodone and Lunesta - no Ambien.

## 2023-09-03 ENCOUNTER — Other Ambulatory Visit: Payer: Self-pay | Admitting: Family

## 2023-09-03 MED ORDER — LEVOFLOXACIN 500 MG PO TABS: 500.0000 mg | ORAL_TABLET | Freq: Every day | ORAL | 0 refills | Status: DC

## 2023-09-05 NOTE — Telephone Encounter (Signed)
 Taken care of

## 2023-09-13 ENCOUNTER — Other Ambulatory Visit: Payer: Self-pay | Admitting: Family

## 2023-09-17 ENCOUNTER — Telehealth: Payer: Self-pay

## 2023-09-17 NOTE — Telephone Encounter (Signed)
 Pt called and stated that she was seen at [facility] and her Dr. Is requesting xrays and medical records from where she was seen about her back faxed over to [phone] Dr. Cam Hai

## 2023-09-25 ENCOUNTER — Ambulatory Visit: Payer: Medicaid Other | Admitting: Family

## 2023-09-25 VITALS — BP 118/71 | HR 87 | Ht 67.0 in | Wt 130.6 lb

## 2023-09-25 DIAGNOSIS — F5101 Primary insomnia: Secondary | ICD-10-CM

## 2023-09-25 DIAGNOSIS — Z0130 Encounter for examination of blood pressure without abnormal findings: Secondary | ICD-10-CM

## 2023-09-25 DIAGNOSIS — T2124XA Burn of second degree of lower back, initial encounter: Secondary | ICD-10-CM | POA: Diagnosis not present

## 2023-09-25 MED ORDER — METHOCARBAMOL 500 MG PO TABS: 500.0000 mg | ORAL_TABLET | Freq: Four times a day (QID) | ORAL | 3 refills | Status: DC | PRN

## 2023-09-25 MED ORDER — ZOLPIDEM TARTRATE 10 MG PO TABS: 10.0000 mg | ORAL_TABLET | Freq: Every evening | ORAL | 1 refills | Status: DC | PRN

## 2023-09-25 MED ORDER — SILVER SULFADIAZINE 1 % EX CREA: TOPICAL_CREAM | CUTANEOUS | 0 refills | Status: DC
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
## 2023-09-28 ENCOUNTER — Inpatient Hospital Stay
Admission: EM | Admit: 2023-09-28 | Discharge: 2023-10-02 | DRG: 603 | Disposition: A | Discharge status: Home / Self Care | Payer: Medicaid Other | Attending: Internal Medicine | Admitting: Internal Medicine

## 2023-09-28 ENCOUNTER — Encounter: Payer: Self-pay | Admitting: Family

## 2023-09-28 ENCOUNTER — Encounter: Payer: Self-pay | Admitting: Internal Medicine

## 2023-09-28 ENCOUNTER — Encounter: Payer: Self-pay | Admitting: Intensive Care

## 2023-09-28 ENCOUNTER — Other Ambulatory Visit: Payer: Self-pay

## 2023-09-28 ENCOUNTER — Emergency Department: Payer: Medicaid Other

## 2023-09-28 DIAGNOSIS — I10 Essential (primary) hypertension: Secondary | ICD-10-CM | POA: Diagnosis present

## 2023-09-28 DIAGNOSIS — J439 Emphysema, unspecified: Secondary | ICD-10-CM | POA: Diagnosis present

## 2023-09-28 DIAGNOSIS — F17210 Nicotine dependence, cigarettes, uncomplicated: Secondary | ICD-10-CM | POA: Diagnosis present

## 2023-09-28 DIAGNOSIS — Z853 Personal history of malignant neoplasm of breast: Secondary | ICD-10-CM | POA: Diagnosis not present

## 2023-09-28 DIAGNOSIS — L03312 Cellulitis of back [any part except buttock]: Principal | ICD-10-CM | POA: Diagnosis present

## 2023-09-28 DIAGNOSIS — R7303 Prediabetes: Secondary | ICD-10-CM | POA: Diagnosis present

## 2023-09-28 DIAGNOSIS — F418 Other specified anxiety disorders: Secondary | ICD-10-CM | POA: Diagnosis present

## 2023-09-28 DIAGNOSIS — F419 Anxiety disorder, unspecified: Secondary | ICD-10-CM | POA: Diagnosis present

## 2023-09-28 DIAGNOSIS — Z923 Personal history of irradiation: Secondary | ICD-10-CM

## 2023-09-28 DIAGNOSIS — F17209 Nicotine dependence, unspecified, with unspecified nicotine-induced disorders: Secondary | ICD-10-CM | POA: Diagnosis present

## 2023-09-28 DIAGNOSIS — Z9221 Personal history of antineoplastic chemotherapy: Secondary | ICD-10-CM

## 2023-09-28 DIAGNOSIS — J449 Chronic obstructive pulmonary disease, unspecified: Secondary | ICD-10-CM | POA: Diagnosis not present

## 2023-09-28 DIAGNOSIS — C50411 Malignant neoplasm of upper-outer quadrant of right female breast: Secondary | ICD-10-CM | POA: Diagnosis present

## 2023-09-28 DIAGNOSIS — Z8249 Family history of ischemic heart disease and other diseases of the circulatory system: Secondary | ICD-10-CM

## 2023-09-28 DIAGNOSIS — Z7983 Long term (current) use of bisphosphonates: Secondary | ICD-10-CM | POA: Diagnosis not present

## 2023-09-28 DIAGNOSIS — G4700 Insomnia, unspecified: Secondary | ICD-10-CM | POA: Diagnosis present

## 2023-09-28 DIAGNOSIS — L02212 Cutaneous abscess of back [any part, except buttock]: Secondary | ICD-10-CM | POA: Diagnosis present

## 2023-09-28 DIAGNOSIS — E785 Hyperlipidemia, unspecified: Secondary | ICD-10-CM | POA: Diagnosis present

## 2023-09-28 DIAGNOSIS — Z79899 Other long term (current) drug therapy: Secondary | ICD-10-CM

## 2023-09-28 DIAGNOSIS — F5101 Primary insomnia: Secondary | ICD-10-CM | POA: Diagnosis present

## 2023-09-28 LAB — CBC WITH DIFFERENTIAL/PLATELET
Abs Immature Granulocytes: 0.12 10*3/uL — ABNORMAL HIGH (ref 0.00–0.07)
Basophils Absolute: 0.1 10*3/uL (ref 0.0–0.1)
Basophils Relative: 0 %
Eosinophils Absolute: 0.1 10*3/uL (ref 0.0–0.5)
Eosinophils Relative: 0 %
HCT: 43.3 % (ref 36.0–46.0)
Hemoglobin: 14.5 g/dL (ref 12.0–15.0)
Immature Granulocytes: 1 %
Lymphocytes Relative: 7 %
Lymphs Abs: 1.6 10*3/uL (ref 0.7–4.0)
MCH: 29.8 pg (ref 26.0–34.0)
MCHC: 33.5 g/dL (ref 30.0–36.0)
MCV: 89.1 fL (ref 80.0–100.0)
Monocytes Absolute: 1.1 10*3/uL — ABNORMAL HIGH (ref 0.1–1.0)
Monocytes Relative: 5 %
Neutro Abs: 18.6 10*3/uL — ABNORMAL HIGH (ref 1.7–7.7)
Neutrophils Relative %: 87 %
Platelets: 278 10*3/uL (ref 150–400)
RBC: 4.86 MIL/uL (ref 3.87–5.11)
RDW: 13.6 % (ref 11.5–15.5)
WBC: 21.5 10*3/uL — ABNORMAL HIGH (ref 4.0–10.5)
nRBC: 0 % (ref 0.0–0.2)

## 2023-09-28 LAB — COMPREHENSIVE METABOLIC PANEL
ALT: 19 U/L (ref 0–44)
AST: 24 U/L (ref 15–41)
Albumin: 4.4 g/dL (ref 3.5–5.0)
Alkaline Phosphatase: 58 U/L (ref 38–126)
Anion gap: 11 (ref 5–15)
BUN: 18 mg/dL (ref 6–20)
CO2: 21 mmol/L — ABNORMAL LOW (ref 22–32)
Calcium: 10 mg/dL (ref 8.9–10.3)
Chloride: 107 mmol/L (ref 98–111)
Creatinine, Ser: 0.78 mg/dL (ref 0.44–1.00)
GFR, Estimated: >60 mL/min (ref >60)
Glucose, Bld: 130 mg/dL — ABNORMAL HIGH (ref 70–99)
Potassium: 3.6 mmol/L (ref 3.5–5.1)
Sodium: 139 mmol/L (ref 135–145)
Total Bilirubin: 0.5 mg/dL (ref 0.0–1.2)
Total Protein: 8 g/dL (ref 6.5–8.1)

## 2023-09-28 LAB — SEDIMENTATION RATE: Sed Rate: 7 mm/h (ref 0–30)

## 2023-09-28 LAB — LACTIC ACID, PLASMA
Lactic Acid, Venous: 2.4 mmol/L (ref 0.5–1.9)
Lactic Acid, Venous: 0.9 mmol/L (ref 0.5–1.9)

## 2023-09-28 MED ORDER — OXYCODONE-ACETAMINOPHEN 5-325 MG PO TABS
1.0000 | ORAL_TABLET | ORAL | Status: DC | PRN
  Administered 2023-09-28 – 2023-10-01 (×6): 1 via ORAL
  Filled 2023-09-28 (×9): qty 1

## 2023-09-28 MED ORDER — ALBUTEROL SULFATE (2.5 MG/3ML) 0.083% IN NEBU: 2.5000 mg | INHALATION_SOLUTION | RESPIRATORY_TRACT | Status: DC | PRN

## 2023-09-28 MED ORDER — MOMETASONE FURO-FORMOTEROL FUM 200-5 MCG/ACT IN AERO
2.0000 | INHALATION_SPRAY | Freq: Two times a day (BID) | RESPIRATORY_TRACT | Status: DC
  Administered 2023-09-30 – 2023-10-01 (×2): 2 via RESPIRATORY_TRACT
  Filled 2023-09-28 (×2): qty 8.8

## 2023-09-28 MED ORDER — ZOLPIDEM TARTRATE 5 MG PO TABS
5.0000 mg | ORAL_TABLET | Freq: Every evening | ORAL | Status: DC | PRN
  Administered 2023-09-29: 5 mg via ORAL
  Filled 2023-09-28: qty 1

## 2023-09-28 MED ORDER — NICOTINE 21 MG/24HR TD PT24
21.0000 mg | MEDICATED_PATCH | Freq: Every day | TRANSDERMAL | Status: DC
  Filled 2023-09-28 (×4): qty 1

## 2023-09-28 MED ORDER — BUDESON-GLYCOPYRROL-FORMOTEROL 160-9-4.8 MCG/ACT IN AERO: 2.0000 | INHALATION_SPRAY | Freq: Every day | RESPIRATORY_TRACT | Status: DC

## 2023-09-28 MED ORDER — IOHEXOL 300 MG/ML  SOLN
100.0000 mL | Freq: Once | INTRAMUSCULAR | Status: AC | PRN
Start: 2023-09-28 — End: 2023-09-28
  Administered 2023-09-28: 100 mL via INTRAVENOUS

## 2023-09-28 MED ORDER — VANCOMYCIN HCL IN DEXTROSE 1-5 GM/200ML-% IV SOLN
1000.0000 mg | Freq: Once | INTRAVENOUS | Status: DC
  Filled 2023-09-28: qty 200

## 2023-09-28 MED ORDER — DM-GUAIFENESIN ER 30-600 MG PO TB12: 1.0000 | ORAL_TABLET | Freq: Two times a day (BID) | ORAL | Status: DC | PRN

## 2023-09-28 MED ORDER — ACETAMINOPHEN 325 MG PO TABS: 650.0000 mg | ORAL_TABLET | Freq: Four times a day (QID) | ORAL | Status: DC | PRN

## 2023-09-28 MED ORDER — ENOXAPARIN SODIUM 40 MG/0.4ML IJ SOSY
40.0000 mg | PREFILLED_SYRINGE | INTRAMUSCULAR | Status: DC
  Administered 2023-09-28 – 2023-10-01 (×4): 40 mg via SUBCUTANEOUS
  Filled 2023-09-28 (×4): qty 0.4

## 2023-09-28 MED ORDER — VANCOMYCIN HCL 1250 MG/250ML IV SOLN
1250.0000 mg | Freq: Once | INTRAVENOUS | Status: AC
  Administered 2023-09-28: 1250 mg via INTRAVENOUS
  Filled 2023-09-28: qty 250

## 2023-09-28 MED ORDER — SODIUM CHLORIDE 0.9 % IV BOLUS
1000.0000 mL | Freq: Once | INTRAVENOUS | Status: AC
  Administered 2023-09-28: 1000 mL via INTRAVENOUS

## 2023-09-28 MED ORDER — MORPHINE SULFATE (PF) 2 MG/ML IV SOLN
2.0000 mg | INTRAVENOUS | Status: DC | PRN
  Administered 2023-09-28 – 2023-10-02 (×12): 2 mg via INTRAVENOUS
  Filled 2023-09-28 (×12): qty 1

## 2023-09-28 MED ORDER — CLONAZEPAM 1 MG PO TABS
1.0000 mg | ORAL_TABLET | Freq: Three times a day (TID) | ORAL | Status: DC | PRN
  Administered 2023-09-28 – 2023-10-01 (×7): 1 mg via ORAL
  Filled 2023-09-28 (×8): qty 1

## 2023-09-28 MED ORDER — SERTRALINE HCL 50 MG PO TABS
200.0000 mg | ORAL_TABLET | Freq: Every day | ORAL | Status: DC
  Administered 2023-09-29 – 2023-10-02 (×4): 200 mg via ORAL
  Filled 2023-09-28 (×4): qty 4

## 2023-09-28 MED ORDER — HYDRALAZINE HCL 20 MG/ML IJ SOLN: 5.0000 mg | INTRAMUSCULAR | Status: DC | PRN

## 2023-09-28 MED ORDER — TAMOXIFEN CITRATE 10 MG PO TABS
20.0000 mg | ORAL_TABLET | Freq: Every day | ORAL | Status: DC
Start: 2023-09-29 — End: 2023-10-02
  Administered 2023-09-29 – 2023-10-02 (×4): 20 mg via ORAL
  Filled 2023-09-28 (×4): qty 2

## 2023-09-28 MED ORDER — MORPHINE SULFATE (PF) 4 MG/ML IV SOLN
4.0000 mg | Freq: Once | INTRAVENOUS | Status: AC
  Administered 2023-09-28: 4 mg via INTRAVENOUS
  Filled 2023-09-28: qty 1

## 2023-09-28 MED ORDER — ONDANSETRON HCL 4 MG/2ML IJ SOLN
4.0000 mg | Freq: Three times a day (TID) | INTRAMUSCULAR | Status: DC | PRN
  Administered 2023-09-28 – 2023-09-30 (×4): 4 mg via INTRAVENOUS
  Filled 2023-09-28 (×5): qty 2

## 2023-09-28 MED ORDER — LORAZEPAM 2 MG/ML IJ SOLN
1.0000 mg | Freq: Once | INTRAMUSCULAR | Status: AC
  Administered 2023-09-28: 1 mg via INTRAVENOUS
  Filled 2023-09-28: qty 1

## 2023-09-28 MED ORDER — VANCOMYCIN HCL 750 MG IV SOLR
750.0000 mg | Freq: Two times a day (BID) | INTRAVENOUS | Status: DC
  Administered 2023-09-29 – 2023-10-02 (×7): 750 mg via INTRAVENOUS
  Filled 2023-09-28: qty 15
  Filled 2023-09-28: qty 750
  Filled 2023-09-28: qty 15
  Filled 2023-09-28: qty 750
  Filled 2023-09-28: qty 15
  Filled 2023-09-28: qty 750
  Filled 2023-09-28 (×3): qty 15

## 2023-09-28 MED ORDER — METHOCARBAMOL 500 MG PO TABS: 500.0000 mg | ORAL_TABLET | Freq: Four times a day (QID) | ORAL | Status: DC | PRN

## 2023-09-28 MED ORDER — UMECLIDINIUM BROMIDE 62.5 MCG/ACT IN AEPB
1.0000 | INHALATION_SPRAY | Freq: Every day | RESPIRATORY_TRACT | Status: DC
  Administered 2023-09-30: 1 via RESPIRATORY_TRACT
  Filled 2023-09-28 (×3): qty 7

## 2023-09-28 MED ORDER — SODIUM CHLORIDE 0.9 % IV SOLN
2.0000 g | Freq: Once | INTRAVENOUS | Status: AC
  Administered 2023-09-28: 2 g via INTRAVENOUS
  Filled 2023-09-28: qty 20

## 2023-09-28 MED ORDER — SODIUM CHLORIDE 0.9 % IV SOLN
1.0000 g | INTRAVENOUS | Status: DC
  Administered 2023-09-29 – 2023-10-01 (×3): 1 g via INTRAVENOUS
  Filled 2023-09-28 (×4): qty 10

## 2023-09-28 NOTE — H&P (Signed)
History and Physical    Kelsey Russo:[id number] DOB: Sep 06, 1965 DOA: 09/28/2023  Referring MD/NP/PA:   PCP: Miki Kins, FNP   Patient coming from:  The patient is coming from home.     Chief Complaint: pain in back  HPI: Kelsey Russo is a 58 y.o. female with medical history significant of hypertension, hyperlipidemia, prediabetes, COPD, depression with anxiety, tobacco abuse, breast cancer (s/p of right lumpectomy, chemo and radiation therapy), who presents with pain in back.  Patient states that about a week ago she fell asleep with her heating pad on.  Since then, she has noticed discomfort over her back.  She was seen by PCP 3 days ago, who prescribed her Silvadene cream for burn injury to her back, but her pain has been progressively worsening.  Currently the pain is constant, sharp, moderate to severe, nonradiating, not alleviated or aggravated by any known factors.  The area becomes swollen and erythematous.  Denies fever or chills.  Patient has mild dry cough and mild shortness of breath, no chest pain.  No nausea, vomiting, diarrhea or abdominal pain.  No symptoms of UTI.  Patient is very anxious during the interview. Pt also has a small painful boil in left nostril.  Data reviewed independently and ED Course: pt was found to have WBC 25.5, lactic acid 2.4 --> 0.9, GFR> 60, temperature normal, blood pressure 137/101, heart rate 104, RR 20, oxygen saturation 98% on room air.  Patient is admitted to MedSurg bed as inpatient.  CT scan of T-spine: 1. Ill-defined edema in the right posterior paraspinal soft tissues at the T10-T11 level, measuring up to approximately 1.9 x 4.5 by 4.0 cm and compatible with cellulitis/early phlegmonous change given the provided clinical history. No discrete, drainable fluid collection at this time. This edema involves the subcutaneous fat superficial to the paraspinal musculature and does not appear to extend to the canal. 2. No  specific evidence of discitis/osteomyelitis by CT. An MRI of the thoracic spine with contrast could provide more sensitive evaluation. 3. Aortic Atherosclerosis (ICD10-I70.0) and Emphysema (ICD10-J43.9).  EKG:  Not done in ED, will get one.    Review of Systems:   General: no fevers, chills, no body weight gain, has fatigue and anxiety HEENT: no blurry vision, hearing changes or sore throat Respiratory: no dyspnea, coughing, wheezing CV: no chest pain, no palpitations GI: no nausea, vomiting, abdominal pain, diarrhea, constipation GU: no dysuria, burning on urination, increased urinary frequency, hematuria  Ext: no leg edema Neuro: no unilateral weakness, numbness, or tingling, no vision change or hearing loss Skin: Has pain and erythema in the middle back MSK: No muscle spasm, no deformity, no limitation of range of movement in spin Heme: No easy bruising.  Travel history: No recent long distant travel.   Allergy:  Allergies  Allergen Reactions   Imitrex [Sumatriptan] Anaphylaxis    Past Medical History:  Diagnosis Date   Anemia    during chemotherapy   Brain tumor (benign) (HCC)    Breast cancer (HCC) 1999   Status post right breast lumpectomy and radiation   Breast cancer (HCC) 11/2019   right breast   COPD (chronic obstructive pulmonary disease) (HCC)    Depression    History of chemotherapy 1999   History of seizure disorder    Hypertension    Personal history of chemotherapy    breast cancer 2021   Personal history of radiation therapy 1999   Pneumonia    PONV (postoperative nausea and  vomiting)    vomiting    Past Surgical History:  Procedure Laterality Date   BRAIN SURGERY     Brain tumor resection  1991   BREAST BIOPSY Right 1999   Positive   BREAST BIOPSY Right 11/10/2019   [place] bx 10:00 heart marker, positive   BREAST BIOPSY Right 11/10/2019   [place] bx axilla, hydromark 4, positive   BREAST BIOPSY Left 11/10/2019   [place] bx 2:00, venus marker, neg    MASTECTOMY Right 07/13/2020   positive   PORTACATH PLACEMENT Left 11/30/2019   Procedure: INSERTION PORT-A-CATH;  Surgeon: Earline Mayotte, MD;  Location: [facility] ORS;  Service: General;  Laterality: Left;   Right breast lumpectomy     SIMPLE MASTECTOMY WITH AXILLARY SENTINEL NODE BIOPSY Right 07/13/2020   Procedure: SIMPLE MASTECTOMY WITH AXILLARY SENTINEL NODE BIOPSY;  Surgeon: Earline Mayotte, MD;  Location: [facility] ORS;  Service: General;  Laterality: Right;   TONSILLECTOMY     TUBAL LIGATION      Social History:  reports that she has been smoking cigarettes. She has a 10 pack-year smoking history. She has never used smokeless tobacco. She reports that she does not currently use drugs after having used the following drugs: Marijuana. Frequency: 3.00 times per week. She reports that she does not drink alcohol.  Family History:  Family History  Problem Relation Age of Onset   Depression Mother    CAD Father    Hypertension Father    Lung cancer Father    Coronary artery disease Father 56   Breast cancer Maternal Grandmother      Prior to Admission medications   Medication Sig Start Date End Date Taking? Authorizing Provider  albuterol (VENTOLIN HFA) 108 (90 Base) MCG/ACT inhaler Inhale 1-2 puffs into the lungs every 6 (six) hours as needed for wheezing or shortness of breath.    [provider]  alendronate (FOSAMAX) 70 MG tablet Take 1 tablet (70 mg total) by mouth once a week. Take with a full glass of water on an empty stomach. 08/26/23   Miki Kins, FNP  BREZTRI AEROSPHERE 160-9-4.8 MCG/ACT AERO Inhale 2 puffs into the lungs in the morning and at bedtime. 08/26/23   Miki Kins, FNP  clonazePAM (KLONOPIN) 1 MG tablet Take 1 tablet (1 mg total) by mouth 3 (three) times daily as needed for anxiety. 08/26/23   Miki Kins, FNP  clotrimazole (MYCELEX) 10 MG troche TAKE ONE TABLET (10 MG TOTAL) BY MOUTH FIVE (FIVE) TIMES DAILY FOR SEVEN DAYS. 12/20/22    Miki Kins, FNP  cyanocobalamin (VITAMIN B12) 1000 MCG/ML injection Inject 1 mL (1,000 mcg total) into the muscle every 30 (thirty) days. 06/06/23   Miki Kins, FNP  Eszopiclone 3 MG TABS Take 1 tablet (3 mg total) by mouth at bedtime. Take immediately before bedtime 08/26/23   Miki Kins, FNP  gabapentin (NEURONTIN) 300 MG capsule Take 1 capsule (300 mg total) by mouth 2 (two) times daily. 08/26/23   Miki Kins, FNP  hydrOXYzine (ATARAX) 25 MG tablet TAKE ONE TABLET BY MOUTH UP TO THREE TIMES DAILY AS NEEDED FOR ITCHING 07/01/23   Miki Kins, FNP  levofloxacin (LEVAQUIN) 500 MG tablet Take 1 tablet (500 mg total) by mouth daily. 09/03/23   Miki Kins, FNP  losartan (COZAAR) 50 MG tablet Take 1 tablet (50 mg total) by mouth daily. Patient not taking: Reported on 09/25/2023 08/26/23   Miki Kins, FNP  methocarbamol Nicholaus Corolla)  500 MG tablet Take 1 tablet (500 mg total) by mouth every 6 (six) hours as needed for muscle spasms. 09/25/23   Miki Kins, FNP  prochlorperazine (COMPAZINE) 10 MG tablet Take 10 mg by mouth every 8 (eight) hours as needed. 12/21/22   [provider]  sertraline (ZOLOFT) 100 MG tablet Take 2 tablets (200 mg total) by mouth daily. 08/26/23   Miki Kins, FNP  silver sulfADIAZINE (SILVADENE) 1 % cream Apply to affected area daily 09/25/23 09/24/24  Miki Kins, FNP  SYRINGE-NEEDLE, DISP, 3 ML (LUER LOCK SAFETY SYRINGES) 21G X 1-1/2" 3 ML MISC Use to inject B12 into muscle once a month 06/06/23   Miki Kins, FNP  tamoxifen (NOLVADEX) 20 MG tablet TAKE ONE TABLET BY MOUTH ONCE A DAY 06/13/23   Jeralyn Ruths, MD  traMADol (ULTRAM) 50 MG tablet TAKE TWO TABLETS BY MOUTH TWICE DAILY AS NEEDED FOR SEVERE PAIN (NOTE DOSE INCREASE) 08/26/23   Miki Kins, FNP  traZODone (DESYREL) 50 MG tablet TAKE ONE TO TWO TABLETS BY MOUTH EACH NIGHT AT BEDTIME AS NEEDED FOR INSOMNIA 09/02/23   Miki Kins, FNP   varenicline (CHANTIX) 1 MG tablet TAKE ONE TABLET (1 MG TOTAL) BY MOUTH TWO TIMES DAILY. 08/16/23   Miki Kins, FNP  zolpidem (AMBIEN) 10 MG tablet Take 1 tablet (10 mg total) by mouth at bedtime as needed for sleep. 09/25/23   Miki Kins, FNP    Physical Exam: Vitals:   09/28/23 1424 09/28/23 1430 09/28/23 1500 09/28/23 1833  BP: (!) 153/97 (!) 153/97 (!) 137/101   Pulse: 100     Resp: 20     Temp: 98.4 F (36.9 C)   98.2 F (36.8 C)  TempSrc: Oral   Oral  SpO2: 98%     Weight:      Height:       General: Not in acute distress.  Patient is very anxious during the interview HEENT: has a small boil in left nostril       Eyes: PERRL, EOMI, no jaundice       ENT: No discharge from the ears and nose, no pharynx injection, no tonsillar enlargement.        Neck: No JVD, no bruit, no mass felt. Heme: No neck lymph node enlargement. Cardiac: S1/S2, RRR, No murmurs, No gallops or rubs. Respiratory: No rales, wheezing, rhonchi or rubs. GI: Soft, nondistended, nontender, no rebound pain, no organomegaly, BS present. GU: No hematuria Ext: No pitting leg edema bilaterally. 1+DP/PT pulse bilaterally. Musculoskeletal: No joint deformities, No joint redness or warmth, no limitation of ROM in spin. Skin: Has tenderness, erythema, warmth, induration in the middle back, no fluctuance    Neuro: Alert, oriented X3, cranial nerves II-XII grossly intact, moves all extremities normally. M Psych: Patient is not psychotic, no suicidal or hemocidal ideation.   Labs on Admission: I have personally reviewed following labs and imaging studies  CBC: Recent Labs  Lab 09/28/23 0946  WBC 21.5*  NEUTROABS 18.6*  HGB 14.5  HCT 43.3  MCV 89.1  PLT 278   Basic Metabolic Panel: Recent Labs  Lab 09/28/23 0946  NA 139  K 3.6  CL 107  CO2 21*  GLUCOSE 130*  BUN 18  CREATININE 0.78  CALCIUM 10.0   GFR: Estimated Creatinine Clearance: 70.5 mL/min (by C-G formula based on SCr of  0.78 mg/dL). Liver Function Tests: Recent Labs  Lab 09/28/23 0946  AST 24  ALT 19  ALKPHOS 58  BILITOT 0.5  PROT 8.0  ALBUMIN 4.4   No results for input(s): "LIPASE", "AMYLASE" in the last 168 hours. No results for input(s): "AMMONIA" in the last 168 hours. Coagulation Profile: No results for input(s): "INR", "PROTIME" in the last 168 hours. Cardiac Enzymes: No results for input(s): "CKTOTAL", "CKMB", "CKMBINDEX", "TROPONINI" in the last 168 hours. BNP (last 3 results) No results for input(s): "PROBNP" in the last 8760 hours. HbA1C: No results for input(s): "HGBA1C" in the last 72 hours. CBG: No results for input(s): "GLUCAP" in the last 168 hours. Lipid Profile: No results for input(s): "CHOL", "HDL", "LDLCALC", "TRIG", "CHOLHDL", "LDLDIRECT" in the last 72 hours. Thyroid Function Tests: No results for input(s): "TSH", "T4TOTAL", "FREET4", "T3FREE", "THYROIDAB" in the last 72 hours. Anemia Panel: No results for input(s): "VITAMINB12", "FOLATE", "FERRITIN", "TIBC", "IRON", "RETICCTPCT" in the last 72 hours. Urine analysis:    Component Value Date/Time   COLORURINE STRAW (A) 07/13/2015 0935   APPEARANCEUR CLEAR (A) 07/13/2015 0935   LABSPEC 1.008 07/13/2015 0935   PHURINE 6.0 07/13/2015 0935   GLUCOSEU NEGATIVE 07/13/2015 0935   HGBUR NEGATIVE 07/13/2015 0935   BILIRUBINUR NEGATIVE 07/13/2015 0935   KETONESUR NEGATIVE 07/13/2015 0935   PROTEINUR NEGATIVE 07/13/2015 0935   NITRITE NEGATIVE 07/13/2015 0935   LEUKOCYTESUR NEGATIVE 07/13/2015 0935   Sepsis Labs: @LABRCNTIP (procalcitonin:4,lacticidven:4) )No results found for this or any previous visit (from the past 240 hours).   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 Pt has leukocytosis with WBC 21.5, but no fever.  Lactic acid is elevated 2.4 which has normalized already to 0.9.  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     Disposition Plan:  Anticipate discharge back to previous environment  Consults called:  none  Admission status and Level of care: Med-Surg:  as inpt        Dispo: The patient is from: Home              Anticipated d/c is to: Home              Anticipated d/c date is: 2 days              Patient currently is not medically stable to d/c.    Severity of  Illness:  The appropriate patient status for this patient is INPATIENT. Inpatient status is judged to be reasonable and necessary in order to provide the required intensity of service to ensure the patient's safety. The patient's presenting symptoms, physical exam findings, and initial radiographic and laboratory data in the context of their chronic  comorbidities is felt to place them at high risk for further clinical deterioration. Furthermore, it is not anticipated that the patient will be medically stable for discharge from the hospital within 2 midnights of admission.   * I certify that at the point of admission it is my clinical judgment that the patient will require inpatient hospital care spanning beyond 2 midnights from the point of admission due to high intensity of service, high risk for further deterioration and high frequency of surveillance required.*       Date of Service 09/28/2023    Lorretta Harp [facility]   If 7PM-7AM, please contact night-coverage [email or web address] 09/28/2023, 6:59 PM

## 2023-09-28 NOTE — Assessment & Plan Note (Signed)
Sending 90 day supply to [facility] for patient to use good rx card.  She will continue with Remus Loffler, as this has been most effective for her.

## 2023-09-28 NOTE — ED Triage Notes (Signed)
Patient has large abscess on mid back. Reports about a week ago she fell asleep on her heating pad. White heads noticed on abscess and area raised and red.   Also c/o abscess in left nostril.  Reports pcp gave her cream for burn

## 2023-09-28 NOTE — ED Notes (Signed)
Floor called, nurse reviewing chart now. Okay to move.

## 2023-09-28 NOTE — Consult Note (Signed)
Pharmacy Antibiotic Note  Kelsey Russo is a 58 y.o. female admitted on 09/28/2023 with cellulitis.  Pharmacy has been consulted for vancomycin dosing.  Plan: Give vancomycin 1250 mg IV x 1, then start vancomycin 750 mg IV every 12 hours Estimated AUC 561, Cmin 16.7 Wt = 59 kg, Scr rounded to 0.8, Vd 0.72 Vancomycin levels at steady state or as clinically indicated Ceftriaxone 1 gram IV every 24 hours per provider Follow renal function and cultures for adjustments  Height: 5\' 7"  (170.2 cm) Weight: 59 kg (130 lb) IBW/kg (Calculated) : 61.6  Temp (24hrs), Avg:98.3 F (36.8 C), Min:98.2 F (36.8 C), Max:98.4 F (36.9 C)  Recent Labs  Lab 09/28/23 0946 09/28/23 1513  WBC 21.5*  --   CREATININE 0.78  --   LATICACIDVEN 2.4* 0.9    Estimated Creatinine Clearance: 70.5 mL/min (by C-G formula based on SCr of 0.78 mg/dL).    Allergies  Allergen Reactions   Imitrex [Sumatriptan] Anaphylaxis    Antimicrobials this admission: vancomycin 1/25 >>  Ceftriaxone 1/25 >>   Dose adjustments this admission: N/A  Microbiology results: 1/25 BCx: pending   Thank you for allowing pharmacy to be a part of this patient's care.  Barrie Folk, PharmD 09/28/2023 7:01 PM

## 2023-09-28 NOTE — Progress Notes (Signed)
[facility]link following for sepsis protocol.

## 2023-09-28 NOTE — Consult Note (Signed)
CODE SEPSIS - PHARMACY COMMUNICATION  **Broad Spectrum Antibiotics should be administered within 1 hour of Sepsis diagnosis**  Time Code Sepsis Called/Page Received: 1753  Antibiotics Ordered: ceftriaxone and vancomycin   Time of 1st antibiotic administration: 1858  Additional action taken by pharmacy: N/A  Barrie Folk ,PharmD Clinical Pharmacist  09/28/2023  5:59 PM

## 2023-09-28 NOTE — ED Provider Notes (Signed)
[facility] Provider Note    Event Date/Time   First MD Initiated Contact with Patient 09/28/23 1418     (approximate)   History   Abscess   HPI  Kelsey Russo is a 58 year old female presenting to the emergency department for evaluation of back pain.  About a week ago she fell asleep with her heating pad on.  Since then, she has noticed discomfort over her back.  Saw her primary care doctor who prescribed her Silvadene cream, but reports that the area of pain and swelling is worsened.  No fevers or chills.     Physical Exam   Triage Vital Signs: ED Triage Vitals  Encounter Vitals Group     BP 09/28/23 0936 (!) 148/102     Systolic BP Percentile --      Diastolic BP Percentile --      Pulse Rate 09/28/23 0936 (!) 104     Resp 09/28/23 0936 18     Temp 09/28/23 0934 98.3 F (36.8 C)     Temp Source 09/28/23 0934 Oral     SpO2 09/28/23 0936 98 %     Weight 09/28/23 0935 130 lb (59 kg)     Height 09/28/23 0935 5\' 7"  (1.702 m)     Head Circumference --      Peak Flow --      Pain Score 09/28/23 0935 8     Pain Loc --      Pain Education --      Exclude from Growth Chart --     Most recent vital signs: Vitals:   09/28/23 2002 09/28/23 2004  BP: (!) 151/94   Pulse:  83  Resp:    Temp:    SpO2:  100%     General: Awake, interactive  CV:  Mild tachycardia, good peripheral perfusion Resp:  Unlabored respirations, lungs clear to auscultation Abd:  Nondistended, soft, nontender to palpation Neuro:  Symmetric facial movement, fluid speech Back:  Area of firm swelling over the mid back without significant associated fluctuance, red, tender to palpation with overlying areas of scabbing     ED Results / Procedures / Treatments   Labs (all labs ordered are listed, but only abnormal results are displayed) Labs Reviewed  CBC WITH DIFFERENTIAL/PLATELET - Abnormal; Notable for the following components:      Result Value   WBC 21.5 (*)     Neutro Abs 18.6 (*)    Monocytes Absolute 1.1 (*)    Abs Immature Granulocytes 0.12 (*)    All other components within normal limits  COMPREHENSIVE METABOLIC PANEL - Abnormal; Notable for the following components:   CO2 21 (*)    Glucose, Bld 130 (*)    All other components within normal limits  LACTIC ACID, PLASMA - Abnormal; Notable for the following components:   Lactic Acid, Venous 2.4 (*)    All other components within normal limits  CULTURE, BLOOD (SINGLE)  CULTURE, BLOOD (SINGLE)  LACTIC ACID, PLASMA  SEDIMENTATION RATE  C-REACTIVE PROTEIN  HIV ANTIBODY (ROUTINE TESTING W REFLEX)  BASIC METABOLIC PANEL  CBC     EKG EKG independently reviewed interpreted by myself (ER attending) demonstrates:    RADIOLOGY Imaging independently reviewed and interpreted by myself demonstrates:  CT thoracic spine with contrast demonstrates edema of the paraspinal soft tissues without clear fluid collection  PROCEDURES:  Critical Care performed: Yes, see critical care procedure note(s)  CRITICAL CARE Performed by: Trinna Post   Total  critical care time: 34 minutes  Critical care time was exclusive of separately billable procedures and treating other patients.  Critical care was necessary to treat or prevent imminent or life-threatening deterioration.  Critical care was time spent personally by me on the following activities: development of treatment plan with patient and/or surrogate as well as nursing, discussions with consultants, evaluation of patient's response to treatment, examination of patient, obtaining history from patient or surrogate, ordering and performing treatments and interventions, ordering and review of laboratory studies, ordering and review of radiographic studies, pulse oximetry and re-evaluation of patient's condition.   Procedures   MEDICATIONS ORDERED IN ED: Medications  albuterol (PROVENTIL) (2.5 MG/3ML) 0.083% nebulizer solution 2.5 mg (has no  administration in time range)  dextromethorphan-guaiFENesin (MUCINEX DM) 30-600 MG per 12 hr tablet 1 tablet (has no administration in time range)  ondansetron (ZOFRAN) injection 4 mg (4 mg Intravenous Given 09/28/23 2005)  hydrALAZINE (APRESOLINE) injection 5 mg (has no administration in time range)  acetaminophen (TYLENOL) tablet 650 mg (has no administration in time range)  nicotine (NICODERM CQ - dosed in mg/24 hours) patch 21 mg (0 mg Transdermal Hold 09/28/23 1900)  cefTRIAXone (ROCEPHIN) 1 g in sodium chloride 0.9 % 100 mL IVPB (has no administration in time range)  morphine (PF) 2 MG/ML injection 2 mg (2 mg Intravenous Given 09/28/23 2006)  oxyCODONE-acetaminophen (PERCOCET/ROXICET) 5-325 MG per tablet 1 tablet (has no administration in time range)  enoxaparin (LOVENOX) injection 40 mg (has no administration in time range)  tamoxifen (NOLVADEX) tablet 20 mg (has no administration in time range)  sertraline (ZOLOFT) tablet 200 mg (has no administration in time range)  zolpidem (AMBIEN) tablet 5 mg (has no administration in time range)  clonazePAM (KLONOPIN) tablet 1 mg (has no administration in time range)  methocarbamol (ROBAXIN) tablet 500 mg (has no administration in time range)  vancomycin (VANCOREADY) IVPB 1250 mg/250 mL (1,250 mg Intravenous New Bag/Given 09/28/23 2009)  mometasone-formoterol (DULERA) 200-5 MCG/ACT inhaler 2 puff (has no administration in time range)  umeclidinium bromide (INCRUSE ELLIPTA) 62.5 MCG/ACT 1 puff (has no administration in time range)  vancomycin (VANCOCIN) 750 mg in sodium chloride 0.9 % 250 mL IVPB (has no administration in time range)  morphine (PF) 4 MG/ML injection 4 mg (4 mg Intravenous Given 09/28/23 1511)  sodium chloride 0.9 % bolus 1,000 mL (0 mLs Intravenous Stopped 09/28/23 1945)  iohexol (OMNIPAQUE) 300 MG/ML solution 100 mL (100 mLs Intravenous Contrast Given 09/28/23 1555)  cefTRIAXone (ROCEPHIN) 2 g in sodium chloride 0.9 % 100 mL IVPB (0 g  Intravenous Stopped 09/28/23 1945)  sodium chloride 0.9 % bolus 1,000 mL (1,000 mLs Intravenous New Bag/Given 09/28/23 2002)  LORazepam (ATIVAN) injection 1 mg (1 mg Intravenous Given 09/28/23 1859)     IMPRESSION / MDM / ASSESSMENT AND PLAN / ED COURSE  I reviewed the triage vital signs and the nursing notes.  Differential diagnosis includes, but is not limited to, abscess, cellulitis, deeper space infection, skin hypertrophy in the setting of burn  Patient's presentation is most consistent with acute presentation with potential threat to life or bodily function.  58 year old female presenting to the emergency department for evaluation of back pain.  Mild tachycardia on presentation.  Labs notable for significant leukocytosis with WBC of 21.5.  Initial lactic acidosis of 2.4, cleared on repeat.  I do not appreciate a clear abscess pocket on ultrasound of the patient's area of swelling.  Will obtain CT to further evaluate.  Ordered for IV fluids,  morphine for pain control.  CT demonstrates soft tissue edema concerning for cellulitis with early phlegmonous change without discrete abscess.  With tachycardia and leukocytosis, patient does meet sepsis criteria.  Ordered for empiric Rocephin and vancomycin.  Will reach out to hospitalist team to discuss admission.  Case reviewed with Dr. Clyde Lundborg.  He will evaluate the patient for anticipated admission.    FINAL CLINICAL IMPRESSION(S) / ED DIAGNOSES   Final diagnoses:  Cellulitis of back     Rx / DC Orders   ED Discharge Orders     None        Note:  This document was prepared using Dragon voice recognition software and may include unintentional dictation errors.   Trinna Post, MD 09/28/23 2017

## 2023-09-29 DIAGNOSIS — L03312 Cellulitis of back [any part except buttock]: Secondary | ICD-10-CM

## 2023-09-29 LAB — BASIC METABOLIC PANEL
Anion gap: 9 (ref 5–15)
BUN: 11 mg/dL (ref 6–20)
CO2: 19 mmol/L — ABNORMAL LOW (ref 22–32)
Calcium: 8.4 mg/dL — ABNORMAL LOW (ref 8.9–10.3)
Chloride: 108 mmol/L (ref 98–111)
Creatinine, Ser: 0.6 mg/dL (ref 0.44–1.00)
GFR, Estimated: >60 mL/min (ref >60)
Glucose, Bld: 91 mg/dL (ref 70–99)
Potassium: 3.6 mmol/L (ref 3.5–5.1)
Sodium: 136 mmol/L (ref 135–145)

## 2023-09-29 LAB — GLUCOSE, CAPILLARY: Glucose-Capillary: 215 mg/dL — ABNORMAL HIGH (ref 70–99)

## 2023-09-29 LAB — CBC
HCT: 35 % — ABNORMAL LOW (ref 36.0–46.0)
Hemoglobin: 12 g/dL (ref 12.0–15.0)
MCH: 30 pg (ref 26.0–34.0)
MCHC: 34.3 g/dL (ref 30.0–36.0)
MCV: 87.5 fL (ref 80.0–100.0)
Platelets: 220 10*3/uL (ref 150–400)
RBC: 4 MIL/uL (ref 3.87–5.11)
RDW: 13.5 % (ref 11.5–15.5)
WBC: 18 10*3/uL — ABNORMAL HIGH (ref 4.0–10.5)
nRBC: 0 % (ref 0.0–0.2)

## 2023-09-29 LAB — HIV ANTIBODY (ROUTINE TESTING W REFLEX): HIV Screen 4th Generation wRfx: NONREACTIVE

## 2023-09-29 LAB — C-REACTIVE PROTEIN: CRP: 0.6 mg/dL (ref <1.0)

## 2023-09-29 MED ORDER — SODIUM CHLORIDE 0.9 % IV SOLN
12.5000 mg | Freq: Four times a day (QID) | INTRAVENOUS | Status: DC | PRN
  Administered 2023-09-29: 12.5 mg via INTRAVENOUS
  Filled 2023-09-29: qty 12.5
  Filled 2023-09-29: qty 0.5

## 2023-09-29 MED ORDER — KETOROLAC TROMETHAMINE 15 MG/ML IJ SOLN
15.0000 mg | Freq: Four times a day (QID) | INTRAMUSCULAR | Status: DC
  Administered 2023-09-29 – 2023-10-02 (×13): 15 mg via INTRAVENOUS
  Filled 2023-09-29 (×13): qty 1

## 2023-09-29 MED ORDER — SALINE SPRAY 0.65 % NA SOLN: 1.0000 | NASAL | Status: DC | PRN

## 2023-09-29 MED ORDER — FLUTICASONE PROPIONATE 50 MCG/ACT NA SUSP
2.0000 | Freq: Every day | NASAL | Status: DC
  Administered 2023-09-30: 2 via NASAL
  Filled 2023-09-29 (×2): qty 16

## 2023-09-29 MED ORDER — BUTALBITAL-APAP-CAFFEINE 50-325-40 MG PO TABS
1.0000 | ORAL_TABLET | Freq: Four times a day (QID) | ORAL | Status: DC | PRN
  Administered 2023-09-29 – 2023-10-02 (×6): 1 via ORAL
  Filled 2023-09-29 (×7): qty 1

## 2023-09-29 MED ORDER — GABAPENTIN 300 MG PO CAPS
300.0000 mg | ORAL_CAPSULE | Freq: Two times a day (BID) | ORAL | Status: DC
  Administered 2023-09-29 – 2023-10-02 (×7): 300 mg via ORAL
  Filled 2023-09-29 (×2): qty 1
  Filled 2023-09-29: qty 3
  Filled 2023-09-29 (×4): qty 1

## 2023-09-29 MED ORDER — VARENICLINE TARTRATE 0.5 MG PO TABS
1.0000 mg | ORAL_TABLET | Freq: Two times a day (BID) | ORAL | Status: DC
  Administered 2023-09-29 – 2023-10-02 (×5): 1 mg via ORAL
  Filled 2023-09-29 (×7): qty 2

## 2023-09-29 MED ORDER — METHOCARBAMOL 500 MG PO TABS
750.0000 mg | ORAL_TABLET | Freq: Four times a day (QID) | ORAL | Status: DC
  Administered 2023-09-29 – 2023-10-02 (×13): 750 mg via ORAL
  Filled 2023-09-29 (×13): qty 2

## 2023-09-29 MED ORDER — ZOLPIDEM TARTRATE 5 MG PO TABS
10.0000 mg | ORAL_TABLET | Freq: Every evening | ORAL | Status: DC | PRN
  Administered 2023-09-29: 10 mg via ORAL
  Filled 2023-09-29: qty 2

## 2023-09-29 MED ORDER — HYDROXYZINE HCL 25 MG PO TABS
25.0000 mg | ORAL_TABLET | Freq: Three times a day (TID) | ORAL | Status: DC | PRN
  Administered 2023-09-29 – 2023-09-30 (×2): 25 mg via ORAL
  Filled 2023-09-29 (×2): qty 1

## 2023-09-29 NOTE — Plan of Care (Signed)

## 2023-09-29 NOTE — Progress Notes (Signed)
PROGRESS NOTE    Kelsey Russo  XHB:[id number] DOB: [birth year] DOA: 09/28/2023 PCP: Miki Kins, FNP    Brief Narrative:   58 y.o. female with medical history significant of hypertension, hyperlipidemia, prediabetes, COPD, depression with anxiety, tobacco abuse, breast cancer (s/p of right lumpectomy, chemo and radiation therapy), who presents with pain in back.   Patient states that about a week ago she fell asleep with her heating pad on.  Since then, she has noticed discomfort over her back.  She was seen by PCP 3 days ago, who prescribed her Silvadene cream for burn injury to her back, but her pain has been progressively worsening.  Currently the pain is constant, sharp, moderate to severe, nonradiating, not alleviated or aggravated by any known factors.  The area becomes swollen and erythematous.  Denies fever or chills.  Patient has mild dry cough and mild shortness of breath, no chest pain.  No nausea, vomiting, diarrhea or abdominal pain.  No symptoms of UTI.  Patient is very anxious during the interview. Pt also has a small painful boil in left nostril.   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 Pt has leukocytosis with WBC 21.5, but no fever.  Lactic acid is elevated 2.4 which has normalized already to 0.9.  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  Resting in bed.  Endorses pain in the back, nausea, headache  Objective: Vitals:   09/28/23 2004 09/28/23 2110 09/29/23 0350 09/29/23 0814  BP:  (!) 147/88 132/77 (!) 147/96  Pulse: 83 72 83 88  Resp:   16 16  Temp:  99.5 F (37.5 C) 98.5 F (36.9 C) 98.5 F (36.9 C)  TempSrc:  Oral Oral Oral  SpO2: 100% 99% 97% 97%  Weight:      Height:        Intake/Output Summary (Last 24 hours) at 09/29/2023 1224 Last data filed at 09/29/2023 1025 Gross per 24 hour  Intake 1100 ml  Output --  Net 1100 ml   Filed Weights   09/28/23 0935  Weight: 59 kg    Examination:  General exam: Appeared uncomfortable Respiratory system: Clear to auscultation. Respiratory effort normal. Cardiovascular system: S1-S2, RRR, no murmurs, no pedal edema Gastrointestinal system: Soft,  NT/ND, normal bowel sounds Central nervous system: Alert and oriented. No focal neurological deficits. Extremities: Symmetric 5 x 5 power. Skin: Mid back lesion with erythema, warmth, induration.  Tender to palpation Psychiatry: Judgement and insight appear normal. Mood & affect appropriate.     Data Reviewed: I  have personally reviewed following labs and imaging studies  CBC: Recent Labs  Lab 09/28/23 0946 09/29/23 0436  WBC 21.5* 18.0*  NEUTROABS 18.6*  --   HGB 14.5 12.0  HCT 43.3 35.0*  MCV 89.1 87.5  PLT 278 220   Basic Metabolic Panel: Recent Labs  Lab 09/28/23 0946 09/29/23 0436  NA 139 136  K 3.6 3.6  CL 107 108  CO2 21* 19*  GLUCOSE 130* 91  BUN 18 11  CREATININE 0.78 0.60  CALCIUM 10.0 8.4*   GFR: Estimated Creatinine Clearance: 70.5 mL/min (by C-G formula based on SCr of 0.6 mg/dL). Liver Function Tests: Recent Labs  Lab 09/28/23 0946  AST 24  ALT 19  ALKPHOS 58  BILITOT 0.5  PROT 8.0  ALBUMIN 4.4   No results for input(s): "LIPASE", "AMYLASE" in the last 168 hours. No results for input(s): "AMMONIA" in the last 168 hours. Coagulation Profile: No results for input(s): "INR", "PROTIME" in the last 168 hours. Cardiac Enzymes: No results for input(s): "CKTOTAL", "CKMB", "CKMBINDEX", "TROPONINI" in the last 168 hours. BNP (last 3 results) No results for input(s): "PROBNP" in the last 8760 hours. HbA1C: No results for input(s): "HGBA1C" in the last 72 hours. CBG: No results for input(s): "GLUCAP" in the last 168 hours. Lipid Profile: No results for input(s): "CHOL", "HDL", "LDLCALC", "TRIG", "CHOLHDL", "LDLDIRECT" in the last 72 hours. Thyroid Function Tests: No results for input(s): "TSH", "T4TOTAL", "FREET4", "T3FREE", "THYROIDAB" in the last 72 hours. Anemia Panel: No results for input(s): "VITAMINB12", "FOLATE", "FERRITIN", "TIBC", "IRON", "RETICCTPCT" in the last 72 hours. Sepsis Labs: Recent Labs  Lab 09/28/23 0946 09/28/23 1513  LATICACIDVEN 2.4* 0.9    Recent Results (from the past 240 hours)  Blood culture (single)     Status: None (Preliminary result)   Collection Time: 09/28/23  9:46 AM   Specimen: BLOOD  Result Value Ref Range Status   Specimen Description BLOOD LEFT ANTECUBITAL  Final   Special Requests   Final    BOTTLES DRAWN  AEROBIC AND ANAEROBIC Blood Culture results may not be optimal due to an inadequate volume of blood received in culture bottles   Culture   Final    NO GROWTH < 24 HOURS Performed at [facility], [address], [place], [place] [address]    Report Status PENDING  Incomplete  Blood culture (routine single)     Status: None (Preliminary result)   Collection Time: 09/28/23  6:04 PM   Specimen: BLOOD LEFT ARM  Result Value Ref Range Status   Specimen Description BLOOD LEFT ARM  Final   Special Requests   Final    BOTTLES DRAWN AEROBIC AND ANAEROBIC Blood Culture results may not be optimal due to an inadequate volume of blood received in culture bottles   Culture   Final    NO GROWTH < 12 HOURS Performed at [facility], [address] [place], [place] [address]    Report Status PENDING  Incomplete         Radiology Studies: CT THORACIC SPINE W CONTRAST Result Date: 09/28/2023 CLINICAL DATA:  Mid-back pain, infection suspected, no prior imaging soft tissue swelling over lower thoracic spine, eval abscess/depth CT EXAM:  CT THORACIC SPINE WITH CONTRAST TECHNIQUE: Multidetector CT images of thoracic was performed according to the standard protocol following intravenous contrast administration. RADIATION DOSE REDUCTION: This exam was performed according to the departmental dose-optimization program which includes automated exposure control, adjustment of the mA and/or kV according to patient size and/or use of iterative reconstruction technique. CONTRAST:  OMNIPAQUE IOHEXOL 300 MG/ML  SOLN COMPARISON:  Of the chest December 01, 2019. FINDINGS: Alignment: Exaggerated thoracic kyphosis. No substantial sagittal subluxation. Vertebrae: Vertebral body heights are maintained. No evidence of acute fracture. No bony erosive changes site from a few small degenerative Schmorl's nodes. Paraspinal and other soft tissues: Ill-defined edema in the right posterior paraspinal soft tissues  at the T10-T11 level, measuring up to approximately 1.9 x 4.5 by 4.0 cm on series 4, image 136 and series 8, image 84. No discrete, drainable fluid collection at this time. Edema involves the subcutaneous fat superficial to the paraspinal musculature does not appear to extend into the canal. No obvious fluid collection within the canal limitation of CT technique. Disc levels: Severe degenerative disease at T10-T11 with more moderate multilevel degenerative disease at other levels. Other: Emphysema.  Aortic atherosclerosis. IMPRESSION: 1. Ill-defined edema in the right posterior paraspinal soft tissues at the T10-T11 level, measuring up to approximately 1.9 x 4.5 by 4.0 cm and compatible with cellulitis/early phlegmonous change given the provided clinical history. No discrete, drainable fluid collection at this time. This edema involves the subcutaneous fat superficial to the paraspinal musculature and does not appear to extend to the canal. 2. No specific evidence of discitis/osteomyelitis by CT. An MRI of the thoracic spine with contrast could provide more sensitive evaluation. 3. Aortic Atherosclerosis (ICD10-I70.0) and Emphysema (ICD10-J43.9). Electronically Signed   By: Feliberto Harts M.D.   On: 09/28/2023 17:15        Scheduled Meds:  enoxaparin (LOVENOX) injection  40 mg Subcutaneous Q24H   gabapentin  300 mg Oral BID   ketorolac  15 mg Intravenous Q6H   methocarbamol  750 mg Oral QID   mometasone-formoterol  2 puff Inhalation BID   nicotine  21 mg Transdermal Daily   sertraline  200 mg Oral Daily   tamoxifen  20 mg Oral Daily   umeclidinium bromide  1 puff Inhalation Daily   varenicline  1 mg Oral BID   Continuous Infusions:  cefTRIAXone (ROCEPHIN)  IV     promethazine (PHENERGAN) injection (IM or IVPB) 12.5 mg (09/29/23 1103)   vancomycin (VANCOCIN) 750 mg in sodium chloride 0.9 % 250 mL IVPB 750 mg (09/29/23 0825)     LOS: 1 day       Tresa Moore, MD [facility]   If 7PM-7AM, please contact night-coverage  09/29/2023, 12:24 PM

## 2023-09-29 NOTE — TOC CM/SW Note (Signed)
Transition of Care [facility]) - Inpatient Brief Assessment   Patient Details  Name: Kelsey Russo MRN: [id number] Date of Birth: 1966/07/24  Transition of Care [facility]) CM/SW Contact:    Rodney Langton, RN Phone Number: 09/29/2023, 9:56 AM   Clinical Narrative:  Brief assessment done, no TOC needs identified. Please place consult if needs change.  Transition of Care Asessment: Insurance and Status: Insurance coverage has been reviewed Patient has primary care physician: Yes Home environment has been reviewed: Yes Prior level of function:: Independent Prior/Current Home Services: No current home services Social Drivers of Health Review: SDOH reviewed no interventions necessary Readmission risk has been reviewed: Yes Transition of care needs: no transition of care needs at this time

## 2023-09-30 DIAGNOSIS — L03312 Cellulitis of back [any part except buttock]: Secondary | ICD-10-CM | POA: Diagnosis not present

## 2023-09-30 LAB — CBC WITH DIFFERENTIAL/PLATELET
Abs Immature Granulocytes: 0.05 10*3/uL (ref 0.00–0.07)
Basophils Absolute: 0.1 10*3/uL (ref 0.0–0.1)
Basophils Relative: 1 %
Eosinophils Absolute: 0.2 10*3/uL (ref 0.0–0.5)
Eosinophils Relative: 2 %
HCT: 40.2 % (ref 36.0–46.0)
Hemoglobin: 13.5 g/dL (ref 12.0–15.0)
Immature Granulocytes: 0 %
Lymphocytes Relative: 14 %
Lymphs Abs: 1.6 10*3/uL (ref 0.7–4.0)
MCH: 30.3 pg (ref 26.0–34.0)
MCHC: 33.6 g/dL (ref 30.0–36.0)
MCV: 90.1 fL (ref 80.0–100.0)
Monocytes Absolute: 1 10*3/uL (ref 0.1–1.0)
Monocytes Relative: 9 %
Neutro Abs: 8.9 10*3/uL — ABNORMAL HIGH (ref 1.7–7.7)
Neutrophils Relative %: 74 %
Platelets: 244 10*3/uL (ref 150–400)
RBC: 4.46 MIL/uL (ref 3.87–5.11)
RDW: 13.6 % (ref 11.5–15.5)
WBC: 11.8 10*3/uL — ABNORMAL HIGH (ref 4.0–10.5)
nRBC: 0 % (ref 0.0–0.2)

## 2023-09-30 LAB — BASIC METABOLIC PANEL
Anion gap: 8 (ref 5–15)
BUN: 9 mg/dL (ref 6–20)
CO2: 24 mmol/L (ref 22–32)
Calcium: 9.1 mg/dL (ref 8.9–10.3)
Chloride: 106 mmol/L (ref 98–111)
Creatinine, Ser: 0.75 mg/dL (ref 0.44–1.00)
GFR, Estimated: >60 mL/min (ref >60)
Glucose, Bld: 94 mg/dL (ref 70–99)
Potassium: 3.2 mmol/L — ABNORMAL LOW (ref 3.5–5.1)
Sodium: 138 mmol/L (ref 135–145)

## 2023-09-30 MED ORDER — ALENDRONATE SODIUM 70 MG PO TABS: 70.0000 mg | ORAL_TABLET | ORAL | Status: DC

## 2023-09-30 MED ORDER — ZOLPIDEM TARTRATE 5 MG PO TABS
5.0000 mg | ORAL_TABLET | Freq: Every evening | ORAL | Status: DC | PRN
  Administered 2023-09-30 – 2023-10-01 (×2): 5 mg via ORAL
  Filled 2023-09-30 (×2): qty 1

## 2023-09-30 NOTE — Progress Notes (Signed)
PROGRESS NOTE    Kelsey Russo  WUJ:[id number] DOB: [birth year] DOA: 09/28/2023 PCP: Miki Kins, FNP    Brief Narrative:   58 y.o. female with medical history significant of hypertension, hyperlipidemia, prediabetes, COPD, depression with anxiety, tobacco abuse, breast cancer (s/p of right lumpectomy, chemo and radiation therapy), who presents with pain in back.   Patient states that about a week ago she fell asleep with her heating pad on.  Since then, she has noticed discomfort over her back.  She was seen by PCP 3 days ago, who prescribed her Silvadene cream for burn injury to her back, but her pain has been progressively worsening.  Currently the pain is constant, sharp, moderate to severe, nonradiating, not alleviated or aggravated by any known factors.  The area becomes swollen and erythematous.  Denies fever or chills.  Patient has mild dry cough and mild shortness of breath, no chest pain.  No nausea, vomiting, diarrhea or abdominal pain.  No symptoms of UTI.  Patient is very anxious during the interview. Pt also has a small painful boil in left nostril.   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 Pt has leukocytosis with WBC 21.5, but no fever.  Lactic acid is elevated 2.4 which has normalized already to 0.9.  White count downtrending Plan: Continue broad-spectrum IV antibiotics for now Monitor cultures and de-escalate as appropriate  Monitor vitals and fever curve Clinically improving over interval Ensure pain control   Essential hypertension As needed  IV hydralazine for now Hold scheduled losartan as patient had previously stopped that Will need outpatient follow-up after acute stabilization   Chronic obstructive pulmonary disease, unspecified COPD type (HCC): Stable -Bronchodilators as needed Mucinex   Primary cancer of upper outer quadrant of right female breast (HCC) -Tamoxifen   Primary insomnia -Prn Ambien   Tobacco use disorder, continuous -Nicotine patch   Depression with anxiety -Zoloft, Klonopin -As needed IV Ativan for severe anxiety   DVT prophylaxis: SQ Lovenox Code Status: Full Family Communication: None today Disposition Plan: Status is: Inpatient Remains inpatient appropriate because: Cellulitis on IV antibiotics   Level of care: Med-Surg  Consultants:  None  Procedures:  None  Antimicrobials: Vancomycin Ceftriaxone   Subjective: Seen and examined.  Resting in bed.  Appears more comfortable today.  Objective: Vitals:   09/29/23 0814 09/29/23 1557 09/29/23 2039 09/30/23 0819  BP: (!) 147/96 128/80 (!) 148/92 (!) 140/84  Pulse: 88 78 82 74  Resp: 16 18 16 18   Temp: 98.5 F (36.9 C) 98.3 F (36.8 C) 98.2 F (36.8 C) 97.9 F (36.6 C)  TempSrc: Oral  Oral Oral  SpO2: 97% 97% 98% 97%  Weight:      Height:        Intake/Output Summary (Last 24 hours) at 09/30/2023 1216 Last data filed at 09/30/2023 0900 Gross per 24 hour  Intake 1245.42 ml  Output --  Net 1245.42 ml   Filed Weights   09/28/23 0935  Weight: 59 kg    Examination:  General exam: NAD Respiratory system: Clear to auscultation. Respiratory effort normal. Cardiovascular system: S1-S2, RRR, no  murmurs, no pedal edema Gastrointestinal system: Soft, NT/ND, normal bowel sounds Central nervous system: Alert and oriented. No focal neurological deficits. Extremities: Symmetric 5 x 5 power. Skin: Mid back lesion with erythema, warmth, induration.  Tender to palpation, improving prior Psychiatry: Judgement and insight appear  normal. Mood & affect appropriate.     Data Reviewed: I have personally reviewed following labs and imaging studies  CBC: Recent Labs  Lab 09/28/23 0946 09/29/23 0436 09/30/23 0845  WBC 21.5* 18.0* 11.8*  NEUTROABS 18.6*  --  8.9*  HGB 14.5 12.0 13.5  HCT 43.3 35.0* 40.2  MCV 89.1 87.5 90.1  PLT 278 220 244   Basic Metabolic Panel: Recent Labs  Lab 09/28/23 0946 09/29/23 0436 09/30/23 0845  NA 139 136 138  K 3.6 3.6 3.2*  CL 107 108 106  CO2 21* 19* 24  GLUCOSE 130* 91 94  BUN 18 11 9   CREATININE 0.78 0.60 0.75  CALCIUM 10.0 8.4* 9.1   GFR: Estimated Creatinine Clearance: 70.5 mL/min (by C-G formula based on SCr of 0.75 mg/dL). Liver Function Tests: Recent Labs  Lab 09/28/23 0946  AST 24  ALT 19  ALKPHOS 58  BILITOT 0.5  PROT 8.0  ALBUMIN 4.4   No results for input(s): "LIPASE", "AMYLASE" in the last 168 hours. No results for input(s): "AMMONIA" in the last 168 hours. Coagulation Profile: No results for input(s): "INR", "PROTIME" in the last 168 hours. Cardiac Enzymes: No results for input(s): "CKTOTAL", "CKMB", "CKMBINDEX", "TROPONINI" in the last 168 hours. BNP (last 3 results) No results for input(s): "PROBNP" in the last 8760 hours. HbA1C: No results for input(s): "HGBA1C" in the last 72 hours. CBG: Recent Labs  Lab 09/29/23 2125  GLUCAP 215*   Lipid Profile: No results for input(s): "CHOL", "HDL", "LDLCALC", "TRIG", "CHOLHDL", "LDLDIRECT" in the last 72 hours. Thyroid Function Tests: No results for input(s): "TSH", "T4TOTAL", "FREET4", "T3FREE", "THYROIDAB" in the last 72 hours. Anemia Panel: No results for input(s): "VITAMINB12", "FOLATE", "FERRITIN", "TIBC", "IRON", "RETICCTPCT" in the last 72 hours. Sepsis Labs: Recent Labs  Lab 09/28/23 0946 09/28/23 1513  LATICACIDVEN 2.4* 0.9    Recent Results (from the past 240 hours)  Blood culture (single)     Status: None (Preliminary result)   Collection Time: 09/28/23  9:46 AM    Specimen: BLOOD  Result Value Ref Range Status   Specimen Description BLOOD LEFT ANTECUBITAL  Final   Special Requests   Final    BOTTLES DRAWN AEROBIC AND ANAEROBIC Blood Culture results may not be optimal due to an inadequate volume of blood received in culture bottles   Culture   Final    NO GROWTH 2 DAYS Performed at [facility], [address], [place], [place] [address]    Report Status PENDING  Incomplete  Blood culture (routine single)     Status: None (Preliminary result)   Collection Time: 09/28/23  6:04 PM   Specimen: BLOOD LEFT ARM  Result Value Ref Range Status   Specimen Description BLOOD LEFT ARM  Final   Special Requests   Final    BOTTLES DRAWN AEROBIC AND ANAEROBIC Blood Culture results may not be optimal due to an inadequate volume of blood received in culture bottles   Culture   Final    NO GROWTH 2 DAYS Performed at [facility], [address], [place], [place] [address]    Report Status PENDING  Incomplete         Radiology Studies: CT THORACIC SPINE  W CONTRAST Result Date: 09/28/2023 CLINICAL DATA:  Mid-back pain, infection suspected, no prior imaging soft tissue swelling over lower thoracic spine, eval abscess/depth CT EXAM: CT THORACIC SPINE WITH CONTRAST TECHNIQUE: Multidetector CT images of thoracic was performed according to the standard protocol following intravenous contrast administration. RADIATION DOSE REDUCTION: This exam was performed according to the departmental dose-optimization program which includes automated exposure control, adjustment of the mA and/or kV according to patient size and/or use of iterative reconstruction technique. CONTRAST:  OMNIPAQUE IOHEXOL 300 MG/ML  SOLN COMPARISON:  Of the chest December 01, 2019. FINDINGS: Alignment: Exaggerated thoracic kyphosis. No substantial sagittal subluxation. Vertebrae: Vertebral body heights are maintained. No evidence of acute fracture. No bony erosive changes site from a  few small degenerative Schmorl's nodes. Paraspinal and other soft tissues: Ill-defined edema in the right posterior paraspinal soft tissues at the T10-T11 level, measuring up to approximately 1.9 x 4.5 by 4.0 cm on series 4, image 136 and series 8, image 84. No discrete, drainable fluid collection at this time. Edema involves the subcutaneous fat superficial to the paraspinal musculature does not appear to extend into the canal. No obvious fluid collection within the canal limitation of CT technique. Disc levels: Severe degenerative disease at T10-T11 with more moderate multilevel degenerative disease at other levels. Other: Emphysema.  Aortic atherosclerosis. IMPRESSION: 1. Ill-defined edema in the right posterior paraspinal soft tissues at the T10-T11 level, measuring up to approximately 1.9 x 4.5 by 4.0 cm and compatible with cellulitis/early phlegmonous change given the provided clinical history. No discrete, drainable fluid collection at this time. This edema involves the subcutaneous fat superficial to the paraspinal musculature and does not appear to extend to the canal. 2. No specific evidence of discitis/osteomyelitis by CT. An MRI of the thoracic spine with contrast could provide more sensitive evaluation. 3. Aortic Atherosclerosis (ICD10-I70.0) and Emphysema (ICD10-J43.9). Electronically Signed   By: Feliberto Harts M.D.   On: 09/28/2023 17:15        Scheduled Meds:  enoxaparin (LOVENOX) injection  40 mg Subcutaneous Q24H   fluticasone  2 spray Each Nare Daily   gabapentin  300 mg Oral BID   ketorolac  15 mg Intravenous Q6H   methocarbamol  750 mg Oral QID   mometasone-formoterol  2 puff Inhalation BID   nicotine  21 mg Transdermal Daily   sertraline  200 mg Oral Daily   tamoxifen  20 mg Oral Daily   umeclidinium bromide  1 puff Inhalation Daily   varenicline  1 mg Oral BID   Continuous Infusions:  cefTRIAXone (ROCEPHIN)  IV Stopped (09/29/23 1812)   promethazine (PHENERGAN)  injection (IM or IVPB) 12.5 mg (09/29/23 1103)   vancomycin (VANCOCIN) 750 mg in sodium chloride 0.9 % 250 mL IVPB 750 mg (09/30/23 1011)     LOS: 2 days       Tresa Moore, MD [facility]   If 7PM-7AM, please contact night-coverage  09/30/2023, 12:16 PM

## 2023-09-30 NOTE — Plan of Care (Signed)
  Problem: Education: Goal: Knowledge of General Education information will improve Description: Including pain rating scale, medication(s)/side effects and non-pharmacologic comfort measures Outcome: Progressing   Problem: Clinical Measurements: Goal: Will remain free from infection Outcome: Progressing   Problem: Nutrition: Goal: Adequate nutrition will be maintained Outcome: Progressing   Problem: Coping: Goal: Level of anxiety will decrease Outcome: Progressing   Problem: Pain Managment: Goal: General experience of comfort will improve and/or be controlled Outcome: Progressing   Problem: Safety: Goal: Ability to remain free from injury will improve Outcome: Progressing

## 2023-09-30 NOTE — Progress Notes (Signed)
PHARMACIST - PHYSICIAN COMMUNICATION  CONCERNING: P&T Medication Policy Regarding Oral Bisphosphonates  RECOMMENDATION: Your order for alendronate (Fosamax), ibandronate (Boniva), or risedronate (Actonel) has been discontinued at this time.  If the patient's post-hospital medical condition warrants safe use of this class of drugs, please resume the pre-hospital regimen upon discharge.  DESCRIPTION:  Alendronate (Fosamax), ibandronate (Boniva), and risedronate (Actonel) can cause severe esophageal erosions in patients who are unable to remain upright at least 30 minutes after taking this medication.   Since brief interruptions in therapy are thought to have minimal impact on bone mineral density, the Pharmacy & Therapeutics Committee has established that bisphosphonate orders should be routinely discontinued during hospitalization.   To override this safety policy and permit administration of Boniva, Fosamax, or Actonel in the hospital, prescribers must write "DO NOT HOLD" in the comments section when placing the order for this class of medications.

## 2023-10-01 DIAGNOSIS — L02212 Cutaneous abscess of back [any part, except buttock]: Secondary | ICD-10-CM

## 2023-10-01 DIAGNOSIS — L03312 Cellulitis of back [any part except buttock]: Secondary | ICD-10-CM | POA: Diagnosis not present

## 2023-10-01 LAB — CREATININE, SERUM
Creatinine, Ser: 0.83 mg/dL (ref 0.44–1.00)
GFR, Estimated: >60 mL/min (ref >60)

## 2023-10-01 MED ORDER — OXYCODONE-ACETAMINOPHEN 5-325 MG PO TABS
1.0000 | ORAL_TABLET | ORAL | Status: DC | PRN
  Administered 2023-10-01: 1 via ORAL
  Administered 2023-10-01 – 2023-10-02 (×2): 2 via ORAL
  Filled 2023-10-01 (×3): qty 2

## 2023-10-01 MED ORDER — CLONAZEPAM 1 MG PO TABS
1.0000 mg | ORAL_TABLET | Freq: Four times a day (QID) | ORAL | Status: DC | PRN
  Administered 2023-10-01 – 2023-10-02 (×4): 1 mg via ORAL
  Filled 2023-10-01 (×4): qty 1

## 2023-10-01 MED ORDER — LIDOCAINE-EPINEPHRINE 1 %-1:100000 IJ SOLN
20.0000 mL | Freq: Once | INTRAMUSCULAR | Status: AC
  Administered 2023-10-01: 20 mL via INTRADERMAL
  Filled 2023-10-01: qty 20

## 2023-10-01 NOTE — Procedures (Signed)
Procedure Note  Date: 10/01/23 2:44 PM  Preforming Provider: Lynden Oxford, PA-C  Pre-Procedure Diagnosis: Back Abscess  Post-Procedure Diagnosis: Back Abscess  Anesthesia: 14 ccs of 1% lidocaine with epinephrine  Findings: Purulent drainage; Cultured   Details of Procedure:  All risks, benefits, and alternatives to above procedure(s) were discussed with the patient and informed consent was obtained. Her back was prepped and draped in standard sterile fashion. 14 ccs of 1% lidocaine with epinephrine with injected intradermally and adequate anesthesia achieved. Using an 11 blade scalpel, a cruciate incision was made over area of fluctuance. Purulence was expressed and cultured. Loculation were broken up with finger fracture and hemostats. The wound was then irrigated with copious amount of NS and packed with 1/4 inch packing and dressing placed. The patient tolerated this well without immediate complications. All sharps were accounted for and disposed of.   Complications: None apparent  -- Lynden Oxford, PA-C [facility] 10/01/2023, 2:44 PM M-F: 7am - 4pm

## 2023-10-01 NOTE — Consult Note (Signed)
[facility] SURGICAL CONSULTATION NOTE (initial) - cpt: [id number]   HISTORY OF PRESENT ILLNESS (HPI):  58 y.o. female presented to [facility] ED initially on 01/25 for evaluation of back abscess/cellulitis. Patient reports sometime last week, she fell asleep on a heating pad on her back. She noticed the area was red but thought nothing of this. Unfortunately, the area continued to get more swollen, painful, and erythematous. No reported fever, chills. Given this progress, she presented to the ED. Work up in the ED revealed a leukocytosis to 21.5K (now 11.8K), Hgb to 14.3, renal function normal with sCr - 0.78, initial venous lactate was 2.4 (now normalized). Blood Cx are without growth. She did have CT T-Spine which showed inflammatory changes without fluid collection. She was admitted to the medicine service and started on rocephin and vancomycin.   Surgery is consulted by hospitalist physician Dr. Lolita Patella, MD in this context for evaluation and management of back abscess.  PAST MEDICAL HISTORY (PMH):  Past Medical History:  Diagnosis Date   Anemia    during chemotherapy   Brain tumor (benign) (HCC)    Breast cancer (HCC) 1999   Status post right breast lumpectomy and radiation   Breast cancer (HCC) 11/2019   right breast   COPD (chronic obstructive pulmonary disease) (HCC)    Depression    History of chemotherapy 1999   History of seizure disorder    Hypertension    Personal history of chemotherapy    breast cancer 2021   Personal history of radiation therapy 1999   Pneumonia    PONV (postoperative nausea and vomiting)    vomiting     PAST SURGICAL HISTORY (PSH):  Past Surgical History:  Procedure Laterality Date   BRAIN SURGERY     Brain tumor resection  1991   BREAST BIOPSY Right 1999   Positive   BREAST BIOPSY Right 11/10/2019   [place] bx 10:00 heart marker, positive   BREAST BIOPSY Right 11/10/2019   [place] bx axilla, hydromark 4, positive   BREAST BIOPSY Left  11/10/2019   [place] bx 2:00, venus marker, neg   MASTECTOMY Right 07/13/2020   positive   PORTACATH PLACEMENT Left 11/30/2019   Procedure: INSERTION PORT-A-CATH;  Surgeon: Earline Mayotte, MD;  Location: [facility] ORS;  Service: General;  Laterality: Left;   Right breast lumpectomy     SIMPLE MASTECTOMY WITH AXILLARY SENTINEL NODE BIOPSY Right 07/13/2020   Procedure: SIMPLE MASTECTOMY WITH AXILLARY SENTINEL NODE BIOPSY;  Surgeon: Earline Mayotte, MD;  Location: [facility] ORS;  Service: General;  Laterality: Right;   TONSILLECTOMY     TUBAL LIGATION       MEDICATIONS:  Prior to Admission medications   Medication Sig Start Date End Date Taking? Authorizing Provider  albuterol (VENTOLIN HFA) 108 (90 Base) MCG/ACT inhaler Inhale 1-2 puffs into the lungs every 6 (six) hours as needed for wheezing or shortness of breath.    [provider]  alendronate (FOSAMAX) 70 MG tablet Take 1 tablet (70 mg total) by mouth once a week. Take with a full glass of water on an empty stomach. 08/26/23   Miki Kins, FNP  BREZTRI AEROSPHERE 160-9-4.8 MCG/ACT AERO Inhale 2 puffs into the lungs in the morning and at bedtime. 08/26/23   Miki Kins, FNP  clonazePAM (KLONOPIN) 1 MG tablet Take 1 tablet (1 mg total) by mouth 3 (three) times daily as needed for anxiety. 08/26/23   Miki Kins, FNP  clotrimazole (MYCELEX) 10 MG troche  TAKE ONE TABLET (10 MG TOTAL) BY MOUTH FIVE (FIVE) TIMES DAILY FOR SEVEN DAYS. 12/20/22   Miki Kins, FNP  cyanocobalamin (VITAMIN B12) 1000 MCG/ML injection Inject 1 mL (1,000 mcg total) into the muscle every 30 (thirty) days. 06/06/23   Miki Kins, FNP  Eszopiclone 3 MG TABS Take 1 tablet (3 mg total) by mouth at bedtime. Take immediately before bedtime 08/26/23   Miki Kins, FNP  gabapentin (NEURONTIN) 300 MG capsule Take 1 capsule (300 mg total) by mouth 2 (two) times daily. 08/26/23   Miki Kins, FNP  hydrOXYzine (ATARAX) 25 MG tablet TAKE ONE  TABLET BY MOUTH UP TO THREE TIMES DAILY AS NEEDED FOR ITCHING 07/01/23   Miki Kins, FNP  levofloxacin (LEVAQUIN) 500 MG tablet Take 1 tablet (500 mg total) by mouth daily. 09/03/23   Miki Kins, FNP  losartan (COZAAR) 50 MG tablet Take 1 tablet (50 mg total) by mouth daily. Patient not taking: Reported on 09/25/2023 08/26/23   Miki Kins, FNP  methocarbamol (ROBAXIN) 500 MG tablet Take 1 tablet (500 mg total) by mouth every 6 (six) hours as needed for muscle spasms. 09/25/23   Miki Kins, FNP  prochlorperazine (COMPAZINE) 10 MG tablet Take 10 mg by mouth every 8 (eight) hours as needed. 12/21/22   [provider]  sertraline (ZOLOFT) 100 MG tablet Take 2 tablets (200 mg total) by mouth daily. 08/26/23   Miki Kins, FNP  silver sulfADIAZINE (SILVADENE) 1 % cream Apply to affected area daily 09/25/23 09/24/24  Miki Kins, FNP  SYRINGE-NEEDLE, DISP, 3 ML (LUER LOCK SAFETY SYRINGES) 21G X 1-1/2" 3 ML MISC Use to inject B12 into muscle once a month 06/06/23   Miki Kins, FNP  tamoxifen (NOLVADEX) 20 MG tablet TAKE ONE TABLET BY MOUTH ONCE A DAY 06/13/23   Jeralyn Ruths, MD  traMADol (ULTRAM) 50 MG tablet TAKE TWO TABLETS BY MOUTH TWICE DAILY AS NEEDED FOR SEVERE PAIN (NOTE DOSE INCREASE) 08/26/23   Miki Kins, FNP  traZODone (DESYREL) 50 MG tablet TAKE ONE TO TWO TABLETS BY MOUTH EACH NIGHT AT BEDTIME AS NEEDED FOR INSOMNIA 09/02/23   Miki Kins, FNP  varenicline (CHANTIX) 1 MG tablet TAKE ONE TABLET (1 MG TOTAL) BY MOUTH TWO TIMES DAILY. 08/16/23   Miki Kins, FNP  zolpidem (AMBIEN) 10 MG tablet Take 1 tablet (10 mg total) by mouth at bedtime as needed for sleep. 09/25/23   Miki Kins, FNP     ALLERGIES:  Allergies  Allergen Reactions   Imitrex [Sumatriptan] Anaphylaxis     SOCIAL HISTORY:  Social History   Socioeconomic History   Marital status: Married    Spouse name: Not on file   Number of children: Not  on file   Years of education: Not on file   Highest education level: Not on file  Occupational History   Not on file  Tobacco Use   Smoking status: Every Day    Current packs/day: 0.25    Average packs/day: 0.3 packs/day for 40.0 years (10.0 ttl pk-yrs)    Types: Cigarettes   Smokeless tobacco: Never  Vaping Use   Vaping status: Never Used  Substance and Sexual Activity   Alcohol use: No    Alcohol/week: 0.0 standard drinks of alcohol   Drug use: Not Currently    Frequency: 3.0 times per week    Types: Marijuana   Sexual activity: Not on file  Other Topics  Concern   Not on file  Social History Narrative   Lives at home with husband.   Social Drivers of Corporate investment banker Strain: Not on file  Food Insecurity: No Food Insecurity (09/28/2023)   Hunger Vital Sign    Worried About Running Out of Food in the Last Year: Never true    Ran Out of Food in the Last Year: Never true  Transportation Needs: No Transportation Needs (09/28/2023)   PRAPARE - Administrator, Civil Service (Medical): No    Lack of Transportation (Non-Medical): No  Physical Activity: Not on file  Stress: Not on file  Social Connections: Socially Isolated (09/28/2023)   Social Connection and Isolation Panel [NHANES]    Frequency of Communication with Friends and Family: More than three times a week    Frequency of Social Gatherings with Friends and Family: Three times a week    Attends Religious Services: Never    Active Member of Clubs or Organizations: No    Attends Banker Meetings: Never    Marital Status: Widowed  Intimate Partner Violence: Not At Risk (09/28/2023)   Humiliation, Afraid, Rape, and Kick questionnaire    Fear of Current or Ex-Partner: No    Emotionally Abused: No    Physically Abused: No    Sexually Abused: No     FAMILY HISTORY:  Family History  Problem Relation Age of Onset   Depression Mother    CAD Father    Hypertension Father    Lung cancer  Father    Coronary artery disease Father 59   Breast cancer Maternal Grandmother       REVIEW OF SYSTEMS:  Review of Systems  Constitutional:  Negative for chills and fever.  Respiratory:  Negative for cough and shortness of breath.   Cardiovascular:  Negative for chest pain and palpitations.  Gastrointestinal:  Negative for nausea and vomiting.  Genitourinary:  Negative for dysuria and urgency.  Skin:  Negative for itching and rash.       + Back Abscess  All other systems reviewed and are negative.   VITAL SIGNS:  Temp:  [97.7 F (36.5 C)-98.5 F (36.9 C)] 98.1 F (36.7 C) (01/28 0819) Pulse Rate:  [62-79] 69 (01/28 0819) Resp:  [18-20] 18 (01/28 0819) BP: (99-131)/(68-82) 121/76 (01/28 0819) SpO2:  [97 %-100 %] 97 % (01/28 0819)     Height: 5\' 7"  (170.2 cm) Weight: 59 kg BMI (Calculated): 20.36   INTAKE/OUTPUT:  01/27 0701 - 01/28 0700 In: 220 [P.O.:120; IV Piggyback:100] Out: -   PHYSICAL EXAM:  Physical Exam Vitals and nursing note reviewed. Exam conducted with a chaperone present.  Constitutional:      General: She is not in acute distress.    Appearance: She is normal weight. She is not ill-appearing.     Comments: Resting in bed; NAD  HENT:     Head: Normocephalic and atraumatic.     Right Ear: Decreased hearing noted.     Left Ear: Decreased hearing noted.     Ears:     Comments: Hard of hearing Eyes:     General: No scleral icterus.    Conjunctiva/sclera: Conjunctivae normal.  Pulmonary:     Effort: Pulmonary effort is normal. No respiratory distress.  Genitourinary:    Comments: Deferred Musculoskeletal:        General: Normal range of motion.  Skin:    General: Skin is warm and dry.     Findings: Abscess  present.          Comments: To the central thoracic back just left of midline there is an area of fluctuance with punctate purulent drainage, consistent with abscess, expectedly tender  Neurological:     General: No focal deficit present.      Mental Status: She is alert and oriented to person, place, and time.  Psychiatric:        Mood and Affect: Mood normal.        Behavior: Behavior normal.     Back Abscess (10/01/2023):    Labs:     Latest Ref Rng & Units 09/30/2023    8:45 AM 09/29/2023    4:36 AM 09/28/2023    9:46 AM  CBC  WBC 4.0 - 10.5 K/uL 11.8  18.0  21.5   Hemoglobin 12.0 - 15.0 g/dL 16.1  09.6  04.5   Hematocrit 36.0 - 46.0 % 40.2  35.0  43.3   Platelets 150 - 400 K/uL 244  220  278       Latest Ref Rng & Units 10/01/2023    4:44 AM 09/30/2023    8:45 AM 09/29/2023    4:36 AM  CMP  Glucose 70 - 99 mg/dL  94  91   BUN 6 - 20 mg/dL  9  11   Creatinine 4.09 - 1.00 mg/dL 8.11  9.14  7.82   Sodium 135 - 145 mmol/L  138  136   Potassium 3.5 - 5.1 mmol/L  3.2  3.6   Chloride 98 - 111 mmol/L  106  108   CO2 22 - 32 mmol/L  24  19   Calcium 8.9 - 10.3 mg/dL  9.1  8.4      Imaging studies:   CT T-Spine (09/27/2022) personally reviewed with noted area of inflammation without fluid to the back, no subcutaneous air, and radiologist report reviewed below:  IMPRESSION: 1. Ill-defined edema in the right posterior paraspinal soft tissues at the T10-T11 level, measuring up to approximately 1.9 x 4.5 by 4.0 cm and compatible with cellulitis/early phlegmonous change given the provided clinical history. No discrete, drainable fluid collection at this time. This edema involves the subcutaneous fat superficial to the paraspinal musculature and does not appear to extend to the canal. 2. No specific evidence of discitis/osteomyelitis by CT. An MRI of the thoracic spine with contrast could provide more sensitive evaluation. 3. Aortic Atherosclerosis (ICD10-I70.0) and Emphysema (ICD10-J43.9).   Assessment/Plan: (ICD-10's: L65.212) 58 y.o. female with back abscess    - Will plan for bedside I&D today; patient agreeable. Will document separately  - All risks, benefits, and alternatives to above procedure(s) were  discussed with the patient, all of her questions were answered to her expressed satisfaction, patient expresses she wishes to proceed, and informed consent was obtained.    - Continue IV Abx; will get wound Cx   - Pain control prn - Will update wound care after procedure - Further management per primary service    All of the above findings and recommendations were discussed with the patient, and all of patient's questions were answered to her expressed satisfaction.  Thank you for the opportunity to participate in this patient's care.   -- Lynden Oxford, PA-C [facility] 10/01/2023, 2:34 PM M-F: 7am - 4pm

## 2023-10-01 NOTE — Progress Notes (Signed)
PROGRESS NOTE    Kelsey Russo  QIO:[id number] DOB: 17-Mar-1966 DOA: 09/28/2023 PCP: Miki Kins, FNP    Brief Narrative:   58 y.o. female with medical history significant of hypertension, hyperlipidemia, prediabetes, COPD, depression with anxiety, tobacco abuse, breast cancer (s/p of right lumpectomy, chemo and radiation therapy), who presents with pain in back.   Patient states that about a week ago she fell asleep with her heating pad on.  Since then, she has noticed discomfort over her back.  She was seen by PCP 3 days ago, who prescribed her Silvadene cream for burn injury to her back, but her pain has been progressively worsening.  Currently the pain is constant, sharp, moderate to severe, nonradiating, not alleviated or aggravated by any known factors.  The area becomes swollen and erythematous.  Denies fever or chills.  Patient has mild dry cough and mild shortness of breath, no chest pain.  No nausea, vomiting, diarrhea or abdominal pain.  No symptoms of UTI.  Patient is very anxious during the interview. Pt also has a small painful boil in left nostril.   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 Pt has leukocytosis with WBC 21.5, but no fever.  Lactic acid is elevated 2.4 which has normalized already to 0.9.  White count downtrending.  Area of phlegmonous changes appears to consolidated into an abscess Plan: Continue IV antibiotics.  General surgery consulted and will perform bedside I&D.  Wound culture will be sent from the procedure.   Appreciate surgical help.  Will follow culture data and tailor antibiotics as appropriate.  Continue wound care.  Ensure pain control.  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  More anxious appearing today  Objective: Vitals:   09/30/23 1557 09/30/23 2146 10/01/23 0456 10/01/23 0819  BP: 99/68 114/77 131/82 121/76  Pulse: 79 74 62 69  Resp: 18 20 20 18   Temp: 98.5 F (36.9 C) 97.7 F (36.5 C) 97.7 F (36.5 C) 98.1 F (36.7 C)  TempSrc: Oral Oral Oral Oral  SpO2: 97% 98% 100% 97%  Weight:      Height:        Intake/Output Summary (Last 24 hours) at 10/01/2023 1521 Last data filed at 10/01/2023 1039 Gross per 24 hour  Intake 340 ml  Output --  Net 340 ml   [employer]  09/28/23 0935  Weight: 59 kg    Examination:  General exam: No acute distress Respiratory system: Clear to auscultation. Respiratory effort normal. Cardiovascular system: S1-S2, RRR, no murmurs, no pedal edema Gastrointestinal system: Soft, NT/ND, normal bowel sounds Central nervous system: Alert and oriented. No focal neurological deficits. Extremities:  Symmetric 5 x 5 power. Skin: Mid back lesion with erythema, warmth, induration.  Tender to palpation with purulent drainage  psychiatry: Judgement and insight appear normal. Mood & affect appropriate.     Data Reviewed: I have personally reviewed following labs and imaging studies  CBC: Recent Labs  Lab 09/28/23 0946 09/29/23 0436 09/30/23 0845  WBC 21.5* 18.0* 11.8*  NEUTROABS 18.6*  --  8.9*  HGB 14.5 12.0 13.5  HCT 43.3 35.0* 40.2  MCV 89.1 87.5 90.1  PLT 278 220 244   Basic Metabolic Panel: Recent Labs  Lab 09/28/23 0946 09/29/23 0436 09/30/23 0845 10/01/23 0444  NA 139 136 138  --   K 3.6 3.6 3.2*  --   CL 107 108 106  --   CO2 21* 19* 24  --   GLUCOSE 130* 91 94  --   BUN 18 11 9   --   CREATININE 0.78 0.60 0.75 0.83  CALCIUM 10.0 8.4* 9.1  --    GFR: Estimated Creatinine Clearance: 68 mL/min (by C-G formula based on SCr of 0.83 mg/dL). Liver Function Tests: Recent Labs  Lab 09/28/23 0946  AST 24  ALT 19  ALKPHOS 58  BILITOT 0.5  PROT 8.0  ALBUMIN 4.4   No results for input(s): "LIPASE", "AMYLASE" in the last 168 hours. No results for input(s): "AMMONIA" in the last 168 hours. Coagulation Profile: No results for input(s): "INR", "PROTIME" in the last 168 hours. Cardiac Enzymes: No results for input(s): "CKTOTAL", "CKMB", "CKMBINDEX", "TROPONINI" in the last 168 hours. BNP (last 3 results) No results for input(s): "PROBNP" in the last 8760 hours. HbA1C: No results for input(s): "HGBA1C" in the last 72 hours. CBG: Recent Labs  Lab 09/29/23 2125  GLUCAP 215*   Lipid Profile: No results for input(s): "CHOL", "HDL", "LDLCALC", "TRIG", "CHOLHDL", "LDLDIRECT" in the last 72 hours. Thyroid Function Tests: No results for input(s): "TSH", "T4TOTAL", "FREET4", "T3FREE", "THYROIDAB" in the last 72 hours. Anemia Panel: No results for input(s): "VITAMINB12", "FOLATE", "FERRITIN", "TIBC", "IRON", "RETICCTPCT" in the last 72 hours. Sepsis Labs: Recent  Labs  Lab 09/28/23 0946 09/28/23 1513  LATICACIDVEN 2.4* 0.9    Recent Results (from the past 240 hours)  Blood culture (single)     Status: None (Preliminary result)   Collection Time: 09/28/23  9:46 AM   Specimen: BLOOD  Result Value Ref Range Status   Specimen Description BLOOD LEFT ANTECUBITAL  Final   Special Requests   Final    BOTTLES DRAWN AEROBIC AND ANAEROBIC Blood Culture results may not be optimal due to an inadequate volume of blood received in culture bottles   Culture   Final    NO GROWTH 3 DAYS Performed at [facility], [address] [place], [place] [address]    Report Status PENDING  Incomplete  Blood culture (routine single)     Status: None (Preliminary result)   Collection Time: 09/28/23  6:04 PM   Specimen: BLOOD LEFT ARM  Result Value Ref Range Status   Specimen Description BLOOD LEFT ARM  Final   Special Requests   Final    BOTTLES DRAWN AEROBIC AND ANAEROBIC Blood Culture results may not be optimal due to  an inadequate volume of blood received in culture bottles   Culture   Final    NO GROWTH 3 DAYS Performed at [facility], [address] [place], [place] [address]    Report Status PENDING  Incomplete         Radiology Studies: No results found.       Scheduled Meds:  enoxaparin (LOVENOX) injection  40 mg Subcutaneous Q24H   fluticasone  2 spray Each Nare Daily   gabapentin  300 mg Oral BID   ketorolac  15 mg Intravenous Q6H   lidocaine-EPINEPHrine  20 mL Intradermal Once   methocarbamol  750 mg Oral QID   mometasone-formoterol  2 puff Inhalation BID   nicotine  21 mg Transdermal Daily   sertraline  200 mg Oral Daily   tamoxifen  20 mg Oral Daily   umeclidinium bromide  1 puff Inhalation Daily   varenicline  1 mg Oral BID   Continuous Infusions:  cefTRIAXone (ROCEPHIN)  IV Stopped (09/30/23 1855)   promethazine (PHENERGAN) injection (IM or IVPB) 12.5 mg (09/29/23 1103)   vancomycin (VANCOCIN) 750 mg in  sodium chloride 0.9 % 250 mL IVPB 750 mg (10/01/23 1052)     LOS: 3 days       Tresa Moore, MD [facility]   If 7PM-7AM, please contact night-coverage  10/01/2023, 3:21 PM

## 2023-10-02 ENCOUNTER — Other Ambulatory Visit: Payer: Self-pay

## 2023-10-02 DIAGNOSIS — L03312 Cellulitis of back [any part except buttock]: Secondary | ICD-10-CM | POA: Diagnosis not present

## 2023-10-02 LAB — CBC WITH DIFFERENTIAL/PLATELET
Abs Immature Granulocytes: 0.02 10*3/uL (ref 0.00–0.07)
Basophils Absolute: 0 10*3/uL (ref 0.0–0.1)
Basophils Relative: 1 %
Eosinophils Absolute: 0.5 10*3/uL (ref 0.0–0.5)
Eosinophils Relative: 6 %
HCT: 31.7 % — ABNORMAL LOW (ref 36.0–46.0)
Hemoglobin: 10.9 g/dL — ABNORMAL LOW (ref 12.0–15.0)
Immature Granulocytes: 0 %
Lymphocytes Relative: 25 %
Lymphs Abs: 1.9 10*3/uL (ref 0.7–4.0)
MCH: 30.4 pg (ref 26.0–34.0)
MCHC: 34.4 g/dL (ref 30.0–36.0)
MCV: 88.5 fL (ref 80.0–100.0)
Monocytes Absolute: 0.5 10*3/uL (ref 0.1–1.0)
Monocytes Relative: 7 %
Neutro Abs: 4.7 10*3/uL (ref 1.7–7.7)
Neutrophils Relative %: 61 %
Platelets: 207 10*3/uL (ref 150–400)
RBC: 3.58 MIL/uL — ABNORMAL LOW (ref 3.87–5.11)
RDW: 13.6 % (ref 11.5–15.5)
WBC: 7.6 10*3/uL (ref 4.0–10.5)
nRBC: 0 % (ref 0.0–0.2)

## 2023-10-02 LAB — BASIC METABOLIC PANEL
Anion gap: 6 (ref 5–15)
BUN: 22 mg/dL — ABNORMAL HIGH (ref 6–20)
CO2: 23 mmol/L (ref 22–32)
Calcium: 8.7 mg/dL — ABNORMAL LOW (ref 8.9–10.3)
Chloride: 108 mmol/L (ref 98–111)
Creatinine, Ser: 0.64 mg/dL (ref 0.44–1.00)
GFR, Estimated: >60 mL/min (ref >60)
Glucose, Bld: 95 mg/dL (ref 70–99)
Potassium: 3.8 mmol/L (ref 3.5–5.1)
Sodium: 137 mmol/L (ref 135–145)

## 2023-10-02 LAB — CREATININE, SERUM
Creatinine, Ser: 0.69 mg/dL (ref 0.44–1.00)
GFR, Estimated: >60 mL/min (ref >60)

## 2023-10-02 MED ORDER — DOXYCYCLINE HYCLATE 100 MG PO CAPS
100.0000 mg | ORAL_CAPSULE | Freq: Two times a day (BID) | ORAL | 0 refills | Status: AC
  Filled 2023-10-02: qty 20, 10d supply, fill #0

## 2023-10-02 MED ORDER — OXYCODONE HCL 5 MG PO TABS
5.0000 mg | ORAL_TABLET | Freq: Three times a day (TID) | ORAL | 0 refills | Status: DC | PRN
  Filled 2023-10-02: qty 20, 7d supply, fill #0

## 2023-10-02 MED ORDER — NICOTINE 21 MG/24HR TD PT24
21.0000 mg | MEDICATED_PATCH | Freq: Every day | TRANSDERMAL | 0 refills | Status: DC
  Filled 2023-10-02: qty 28, 28d supply, fill #0

## 2023-10-02 NOTE — Discharge Summary (Signed)
Physician Discharge Summary   Patient: Kelsey Russo MRN: [id number] DOB: [birth year]  Admit date:     09/28/2023  Discharge date: 10/02/23  Discharge Physician: Jonah Blue   PCP: Miki Kins, FNP   Recommendations at discharge:   Complete antibiotics (doxycycline twice daily for 10 days) Take Diflucan once as needed for yeast Continue to pack wound for 1-2 more days if possible; otherwise, cover with dressing A limited number of oxycodone pain pills have been provided; do not drive or make important decisions while taking this medication. Follow up in the surgery clinic for a wound check in 2 weeks Follow up with NP Talbert Forest in 1-2 weeks Stop smoking; nicotine patch provided  Discharge Diagnoses: Principal Problem:   Cellulitis of back Active Problems:   Essential hypertension   Chronic obstructive pulmonary disease, unspecified COPD type (HCC)   Primary cancer of upper outer quadrant of right female breast (HCC)   Primary insomnia   Tobacco use disorder, continuous   Depression with anxiety   Abscess of back   Hospital Course: 57yo with h/o HTN, HLD, preDM, COPD, anxiety/depression, tobacco dependence, and breast CA s/p chemo/rads and lumpectomy who presented on 1/25 with back cellulitis.  Surgery was consulted and performed I&D on 1/28.    Assessment and Plan:  Cellulitis of back CT scan findings are compatible with cellulitis/early phlegmonous change, no discrete, drainable fluid collection Pt had leukocytosis with WBC 21.5, but no fever Lactic acid was elevated 2.4 but normalized  with white count downtrending Area of phlegmonous changes consolidated into an abscess -> surgery consulted and performed I&D on 1/28 Surgery recommends 10 days of MRSA coverage antibiotics at home; will dc with doxy Wound care at home with packing, if possible; otherwise it can be covered superficially Return to surgery clinic in 2 weeks for wound check Limited number of Oxy  provided for pain control   Essential hypertension Continue to hold losartan, as patient had previously stopped that Will need outpatient follow-up after acute stabilization   Chronic obstructive pulmonary disease, unspecified COPD type Continue Albuterol, Breztri, Astelin nasal spray   Primary cancer of upper outer quadrant of right female breast  Continue Tamoxifen   Tobacco use disorder, continuous Continue Chantix Nicotine patch ordered Smoking cessation encouraged   Depression with anxiety, insomnia Continue Zoloft, Klonopin, Trazodone, Gabapentin, Ambien (Lunesta discontinued, as she should not be taking both medications) Monitor mood with use of Chantix     Consultants: Surgery TOC team   Procedures: I&D 1/28   Antibiotics: Ceftriaxone 1/25-29 Vancomycin 1/25-29 Doxycycline 100 mg PO BID x 10 days  Pain control - [place] Controlled Substance Reporting System database was reviewed. and patient was instructed, not to drive, operate heavy machinery, perform activities at heights, swimming or participation in water activities or provide baby-sitting services while on Pain, Sleep and Anxiety Medications; until their outpatient Physician has advised to do so again. Also recommended to not to take more than prescribed Pain, Sleep and Anxiety Medications.   Disposition: Home Diet recommendation:  Regular diet DISCHARGE MEDICATION: Allergies as of 10/02/2023       Reactions   Imitrex [sumatriptan] Anaphylaxis        Medication List     PAUSE taking these medications    losartan 50 MG tablet Wait to take this until your doctor or other care provider tells you to start again. Commonly known as: COZAAR Take 1 tablet (50 mg total) by mouth daily.       STOP  taking these medications    clotrimazole 10 MG troche Commonly known as: MYCELEX   Eszopiclone 3 MG Tabs   levofloxacin 500 MG tablet Commonly known as: LEVAQUIN   silver sulfADIAZINE 1 %  cream Commonly known as: SILVADENE       TAKE these medications    albuterol 108 (90 Base) MCG/ACT inhaler Commonly known as: VENTOLIN HFA Inhale 1-2 puffs into the lungs every 6 (six) hours as needed for wheezing or shortness of breath.   alendronate 70 MG tablet Commonly known as: FOSAMAX Take 1 tablet (70 mg total) by mouth once a week. Take with a full glass of water on an empty stomach.   azelastine 0.1 % nasal spray Commonly known as: ASTELIN Place 1 spray into both nostrils 2 (two) times daily.   Breztri Aerosphere 160-9-4.8 MCG/ACT Aero Generic drug: Budeson-Glycopyrrol-Formoterol Inhale 2 puffs into the lungs in the morning and at bedtime.   clonazePAM 1 MG tablet Commonly known as: KLONOPIN Take 1 tablet (1 mg total) by mouth 3 (three) times daily as needed for anxiety.   cyanocobalamin 1000 MCG/ML injection Commonly known as: VITAMIN B12 Inject 1 mL (1,000 mcg total) into the muscle every 30 (thirty) days.   doxycycline 100 MG capsule Commonly known as: VIBRAMYCIN Take 1 capsule (100 mg total) by mouth 2 (two) times daily for 10 days.   gabapentin 300 MG capsule Commonly known as: NEURONTIN Take 1 capsule (300 mg total) by mouth 2 (two) times daily.   hydrOXYzine 25 MG tablet Commonly known as: ATARAX TAKE ONE TABLET BY MOUTH UP TO THREE TIMES DAILY AS NEEDED FOR ITCHING   Luer Lock Safety Syringes 21G X 1-1/2" 3 ML Misc Generic drug: SYRINGE-NEEDLE (DISP) 3 ML Use to inject B12 into muscle once a month   methocarbamol 500 MG tablet Commonly known as: ROBAXIN Take 1 tablet (500 mg total) by mouth every 6 (six) hours as needed for muscle spasms.   nicotine 21 mg/24hr patch Commonly known as: NICODERM CQ - dosed in mg/24 hours Place 1 patch (21 mg total) onto the skin daily. Start taking on: October 03, 2023   oxyCODONE 5 MG immediate release tablet Commonly known as: Roxicodone Take 1 tablet (5 mg total) by mouth every 8 (eight) hours as needed.    prochlorperazine 10 MG tablet Commonly known as: COMPAZINE Take 10 mg by mouth every 8 (eight) hours as needed.   sertraline 100 MG tablet Commonly known as: ZOLOFT Take 2 tablets (200 mg total) by mouth daily.   tamoxifen 20 MG tablet Commonly known as: NOLVADEX TAKE ONE TABLET BY MOUTH ONCE A DAY   traMADol 50 MG tablet Commonly known as: ULTRAM TAKE TWO TABLETS BY MOUTH TWICE DAILY AS NEEDED FOR SEVERE PAIN (NOTE DOSE INCREASE)   traZODone 50 MG tablet Commonly known as: DESYREL TAKE ONE TO TWO TABLETS BY MOUTH EACH NIGHT AT BEDTIME AS NEEDED FOR INSOMNIA   varenicline 1 MG tablet Commonly known as: CHANTIX TAKE ONE TABLET (1 MG TOTAL) BY MOUTH TWO TIMES DAILY.   zolpidem 10 MG tablet Commonly known as: AMBIEN Take 1 tablet (10 mg total) by mouth at bedtime as needed for sleep.               Discharge Care Instructions  (From admission, onward)           Start     Ordered   10/02/23 0000  Discharge wound care:       Comments: Pack back wound  with 1/4 inch packing daily, cover with dry gauze, secure. Change daily and as needed.  If unable to arrange for someone to do packing, can simply cover the wound instead with a clean and dry bandage.   10/02/23 1520            Follow-up Information     Donovan Kail, PA-C. Go on 10/16/2023.   Specialty: Physician Assistant Why: Go to appointment on 02/12 at 200 PM Contact information: [address] [place] [address] [phone]                Discharge Exam:   Subjective: Anxious, fretting about home issues, otherwise feeling ok.   Objective: Vitals:   10/02/23 0335 10/02/23 0759  BP: 118/73 (!) 153/90  Pulse: 66 60  Resp: 18 18  Temp: 98 F (36.7 C) 98.5 F (36.9 C)  SpO2: 99% 97%    Intake/Output Summary (Last 24 hours) at 10/02/2023 1520 Last data filed at 10/01/2023 1558 Gross per 24 hour  Intake 240 ml  Output --  Net 240 ml   Filed Weights   09/28/23 0935   Weight: 59 kg    Exam:  General:  Appears calm and comfortable and is in NAD Eyes:  EOMI, normal lids, iris ENT:  grossly normal hearing, lips & tongue, mmm; absent dentition Neck:  no LAD, masses or thyromegaly Cardiovascular:  RRR, no m/r/g. No LE edema.  Respiratory:   CTA bilaterally with no wheezes/rales/rhonchi.  Normal respiratory effort. Abdomen:  soft, NT, ND Back:   wound with packing noted, minimal surrounding erythema Skin:  no rash or induration seen on limited exam other than as above Musculoskeletal:  grossly normal tone BUE/BLE, good ROM, no bony abnormality Psychiatric: blunted/anxious mood and affect, speech fluent and appropriate, AOx3 Neurologic:  CN 2-12 grossly intact, moves all extremities in coordinated fashion  Data Reviewed: I have reviewed the patient's lab results since admission.  Pertinent labs for today include:   Unremarkable BMP WBC 7.6 Hgb 10.9 Wound culture with rare GPC    Condition at discharge: improving  The results of significant diagnostics from this hospitalization (including imaging, microbiology, ancillary and laboratory) are listed below for reference.   Imaging Studies: CT THORACIC SPINE W CONTRAST Result Date: 09/28/2023 CLINICAL DATA:  Mid-back pain, infection suspected, no prior imaging soft tissue swelling over lower thoracic spine, eval abscess/depth CT EXAM: CT THORACIC SPINE WITH CONTRAST TECHNIQUE: Multidetector CT images of thoracic was performed according to the standard protocol following intravenous contrast administration. RADIATION DOSE REDUCTION: This exam was performed according to the departmental dose-optimization program which includes automated exposure control, adjustment of the mA and/or kV according to patient size and/or use of iterative reconstruction technique. CONTRAST:  OMNIPAQUE IOHEXOL 300 MG/ML  SOLN COMPARISON:  Of the chest December 01, 2019. FINDINGS: Alignment: Exaggerated thoracic kyphosis. No  substantial sagittal subluxation. Vertebrae: Vertebral body heights are maintained. No evidence of acute fracture. No bony erosive changes site from a few small degenerative Schmorl's nodes. Paraspinal and other soft tissues: Ill-defined edema in the right posterior paraspinal soft tissues at the T10-T11 level, measuring up to approximately 1.9 x 4.5 by 4.0 cm on series 4, image 136 and series 8, image 84. No discrete, drainable fluid collection at this time. Edema involves the subcutaneous fat superficial to the paraspinal musculature does not appear to extend into the canal. No obvious fluid collection within the canal limitation of CT technique. Disc levels: Severe degenerative disease at T10-T11  with more moderate multilevel degenerative disease at other levels. Other: Emphysema.  Aortic atherosclerosis. IMPRESSION: 1. Ill-defined edema in the right posterior paraspinal soft tissues at the T10-T11 level, measuring up to approximately 1.9 x 4.5 by 4.0 cm and compatible with cellulitis/early phlegmonous change given the provided clinical history. No discrete, drainable fluid collection at this time. This edema involves the subcutaneous fat superficial to the paraspinal musculature and does not appear to extend to the canal. 2. No specific evidence of discitis/osteomyelitis by CT. An MRI of the thoracic spine with contrast could provide more sensitive evaluation. 3. Aortic Atherosclerosis (ICD10-I70.0) and Emphysema (ICD10-J43.9). Electronically Signed   By: Feliberto Harts M.D.   On: 09/28/2023 17:15    Microbiology: Results for orders placed or performed during the hospital encounter of 09/28/23  Blood culture (single)     Status: None (Preliminary result)   Collection Time: 09/28/23  9:46 AM   Specimen: BLOOD  Result Value Ref Range Status   Specimen Description BLOOD LEFT ANTECUBITAL  Final   Special Requests   Final    BOTTLES DRAWN AEROBIC AND ANAEROBIC Blood Culture results may not be optimal  due to an inadequate volume of blood received in culture bottles   Culture   Final    NO GROWTH 4 DAYS Performed at [facility], [address] [place], [place] [address]    Report Status PENDING  Incomplete  Blood culture (routine single)     Status: None (Preliminary result)   Collection Time: 09/28/23  6:04 PM   Specimen: BLOOD LEFT ARM  Result Value Ref Range Status   Specimen Description BLOOD LEFT ARM  Final   Special Requests   Final    BOTTLES DRAWN AEROBIC AND ANAEROBIC Blood Culture results may not be optimal due to an inadequate volume of blood received in culture bottles   Culture   Final    NO GROWTH 4 DAYS Performed at [facility], [address] [place], [place] [address]    Report Status PENDING  Incomplete  Aerobic/Anaerobic Culture w Gram Stain (surgical/deep wound)     Status: None (Preliminary result)   Collection Time: 10/01/23  3:20 PM   Specimen: Back; Abscess  Result Value Ref Range Status   Specimen Description   Final    BACK Performed at [facility] [place], [address] [place], [place] [address]    Special Requests   Final    NONE Performed at [facility], [address] [place], [place] [address]    Gram Stain   Final    RARE WBC PRESENT, PREDOMINANTLY PMN RARE GRAM POSITIVE COCCI    Culture   Final    CULTURE REINCUBATED FOR BETTER GROWTH Performed at [facility], [address], [place], [place] [address]    Report Status PENDING  Incomplete    Labs: CBC: Recent Labs  Lab 09/28/23 0946 09/29/23 0436 09/30/23 0845 10/02/23 0908  WBC 21.5* 18.0* 11.8* 7.6  NEUTROABS 18.6*  --  8.9* 4.7  HGB 14.5 12.0 13.5 10.9*  HCT 43.3 35.0* 40.2 31.7*  MCV 89.1 87.5 90.1 88.5  PLT 278 220 244 207   Basic Metabolic Panel: Recent Labs  Lab 09/28/23 0946 09/29/23 0436 09/30/23 0845 10/01/23 0444 10/02/23 0511 10/02/23 0908  NA 139 136 138  --   --  137  K 3.6 3.6 3.2*  --   --  3.8  CL 107  108 106  --   --  108  CO2 21* 19* 24  --   --  23  GLUCOSE 130* 91 94  --   --  95  BUN 18 11 9   --   --  22*  CREATININE 0.78 0.60 0.75 0.83 0.69 0.64  CALCIUM 10.0 8.4* 9.1  --   --  8.7*   Liver Function Tests: Recent Labs  Lab 09/28/23 0946  AST 24  ALT 19  ALKPHOS 58  BILITOT 0.5  PROT 8.0  ALBUMIN 4.4   CBG: Recent Labs  Lab 09/29/23 2125  GLUCAP 215*    Discharge time spent: greater than 30 minutes.  Signed: Jonah Blue, MD [facility] 10/02/2023

## 2023-10-02 NOTE — Progress Notes (Signed)
[facility] SURGICAL PROGRESS NOTE  Hospital Day(s): 4.   Post op day(s): 1  Interval History:  Patient seen and examined No acute events or new complaints overnight.  Patient reports she is doing okay Back is sore expectedly  Leukocytosis resolved; WBC 7.6K this AM Cx with rare GPC She is on Ceftriaxone  Vital signs in last 24 hours: [min-max] current  Temp:  [98 F (36.7 C)-98.9 F (37.2 C)] 98.5 F (36.9 C) (01/29 0759) Pulse Rate:  [60-84] 60 (01/29 0759) Resp:  [18] 18 (01/29 0759) BP: (118-153)/(73-90) 153/90 (01/29 0759) SpO2:  [96 %-99 %] 97 % (01/29 0759)     Height: 5\' 7"  (170.2 cm) Weight: 59 kg BMI (Calculated): 20.36   Intake/Output last 2 shifts:  01/28 0701 - 01/29 0700 In: 480 [P.O.:480] Out: -    Physical Exam:  Constitutional: alert, cooperative and no distress  Respiratory: breathing non-labored at rest  Integumentary: I&D to right lower back; packing in place; drainage serosanguinous, Induration improved. Tender as expected.   Labs:     Latest Ref Rng & Units 09/30/2023    8:45 AM 09/29/2023    4:36 AM 09/28/2023    9:46 AM  CBC  WBC 4.0 - 10.5 K/uL 11.8  18.0  21.5   Hemoglobin 12.0 - 15.0 g/dL 95.6  21.3  08.6   Hematocrit 36.0 - 46.0 % 40.2  35.0  43.3   Platelets 150 - 400 K/uL 244  220  278       Latest Ref Rng & Units 10/02/2023    5:11 AM 10/01/2023    4:44 AM 09/30/2023    8:45 AM  CMP  Glucose 70 - 99 mg/dL   94   BUN 6 - 20 mg/dL   9   Creatinine 5.78 - 1.00 mg/dL 4.69  6.29  5.28   Sodium 135 - 145 mmol/L   138   Potassium 3.5 - 5.1 mmol/L   3.2   Chloride 98 - 111 mmol/L   106   CO2 22 - 32 mmol/L   24   Calcium 8.9 - 10.3 mg/dL   9.1      Imaging studies: No new pertinent imaging studies   Assessment/Plan:  58 y.o. female s/p bedside I&D for back abscess.   - Continue IV Abx (Ceftriaxone); Cx  w/ GPC. Would treat empirically as MRSA with PO Abx for 10 days for home   - Wound Care: Continue packing  daily while in house. If no help with this at home this can be covered superficially as there is no significant depth   - Pain control prn - Further management per primary service  - Okay for discharge from surgical perspective, Abx and wound care as above. I will be happy to see her in ~2 weeks for wound check   All of the above findings and recommendations were discussed with the patient, and the medical team, and all of patient's questions were answered to her expressed satisfaction.  -- Lynden Oxford, PA-C [facility] 10/02/2023, 9:04 AM M-F: 7am - 4pm

## 2023-10-02 NOTE — Plan of Care (Signed)

## 2023-10-02 NOTE — Hospital Course (Addendum)
57yo with h/o HTN, HLD, preDM, COPD, anxiety/depression, tobacco dependence, and breast CA s/p chemo/rads and lumpectomy who presented on 1/25 with back cellulitis.  Surgery was consulted and performed I&D on 1/28.

## 2023-10-03 ENCOUNTER — Other Ambulatory Visit: Payer: Self-pay | Admitting: Oncology

## 2023-10-03 LAB — CULTURE, BLOOD (SINGLE)
Culture: NO GROWTH
Culture: NO GROWTH

## 2023-10-06 LAB — AEROBIC/ANAEROBIC CULTURE W GRAM STAIN (SURGICAL/DEEP WOUND)

## 2023-10-16 ENCOUNTER — Encounter: Payer: Self-pay | Admitting: Physician Assistant

## 2023-10-16 ENCOUNTER — Ambulatory Visit (INDEPENDENT_AMBULATORY_CARE_PROVIDER_SITE_OTHER): Payer: Medicaid Other | Admitting: Physician Assistant

## 2023-10-16 VITALS — BP 127/75 | HR 92 | Temp 97.8°F | Ht 67.0 in | Wt 126.2 lb

## 2023-10-16 DIAGNOSIS — Z09 Encounter for follow-up examination after completed treatment for conditions other than malignant neoplasm: Secondary | ICD-10-CM

## 2023-10-16 DIAGNOSIS — L03312 Cellulitis of back [any part except buttock]: Secondary | ICD-10-CM

## 2023-10-16 NOTE — Progress Notes (Signed)
[facility] POST-OP OFFICE VISIT  10/16/2023  HPI: Kelsey Russo is a 58 y.o. female 15 days s/p incision and drainage of back abscess  She is doing well Back remains sore but improving No fever, chills Using superficial dressing daily; scant drainage Cx grew MRSA; discharged with Doxycycline  No other complaints    Vital signs: BP 127/75   Pulse 92   Temp 97.8 F (36.6 C) (Oral)   Ht 5\' 7"  (1.702 m)   Wt 126 lb 3.2 oz (57.2 kg)   SpO2 99%   BMI 19.77 kg/m    Physical Exam: Constitutional: Well appearing female, NAD Skin: 2 x 2 cm wound to the central back, there is no depth, wound bed with granulation tissue, no drainage   Assessment/Plan: This is a 58 y.o. female 15 days s/p incision and drainage of back abscess   - Pain control prn  - No need for additional procedures  - No need for additional Abx  - Reviewed wound care; superficial dressing as needed  - She can follow up on as needed basis; She understands to call with questions/concerns  -- Lynden Oxford, PA-C [facility] 10/16/2023, 2:43 PM M-F: 7am - 4pm

## 2023-10-16 NOTE — Patient Instructions (Signed)
Excision of Skin Lesions, Care After The following information offers guidance on how to care for yourself after your procedure. Your health care provider may also give you more specific instructions. If you have problems or questions, contact your health care provider. What can I expect after the procedure? After your procedure, it is common to have: Soreness or mild pain. Some redness and swelling. Follow these instructions at home: Excision site care  Follow instructions from your health care provider about how to take care of your excision site. Make sure you: Wash your hands with soap and water for at least 20 seconds before and after you change your bandage (dressing). If soap and water are not available, use hand sanitizer. Change your dressing as told by your health care provider. Leave stitches (sutures), skin glue, or adhesive strips in place. These skin closures may need to stay in place for 2 weeks or longer. If adhesive strip edges start to loosen and curl up, you may trim the loose edges. Do not remove adhesive strips completely unless your health care provider tells you to do that. Check the excision area every day for signs of infection. Watch for: More redness, swelling, or pain. Fluid or blood. Warmth. Pus or a bad smell. Keep the site clean, dry, and protected for at least 48 hours. For bleeding, apply gentle but firm pressure to the area using a folded towel for 20 minutes. Do not take baths, swim, or use a hot tub until your health care provider approves. Ask your health care provider if you may take showers. You may only be allowed to take sponge baths. General instructions Take over-the-counter and prescription medicines only as told by your health care provider. Follow instructions from your health care provider about how to minimize scarring. Scarring should lessen over time. Avoid sun exposure until the area has healed. Use sunscreen to protect the area from the sun  after it has healed. Avoid high-impact exercise and activities until the sutures are removed or the area heals. Keep all follow-up visits. This is important. Contact a health care provider if: You have more redness, swelling, or pain around your excision site. You have fluid or blood coming from your excision site. Your excision site feels warm to the touch. You have pus or a bad smell coming from your excision site. You have a fever. You have pain that does not improve in 2-3 days after your procedure. Get help right away if: You have bleeding that does not stop with pressure or a dressing. Your wound opens up. Summary Take over-the-counter and prescription medicines only as told by your health care provider. Change your dressing as told by your health care provider. Contact a health care provider if you have redness, swelling, pain, or other signs of infection around your excision site. Keep all follow-up visits. This is important. This information is not intended to replace advice given to you by your health care provider. Make sure you discuss any questions you have with your health care provider. Document Revised: 03/21/2021 Document Reviewed: 03/21/2021 Elsevier Patient Education  2024 [employer].

## 2023-11-14 ENCOUNTER — Other Ambulatory Visit: Payer: Self-pay | Admitting: Family

## 2023-11-20 ENCOUNTER — Other Ambulatory Visit: Payer: Self-pay | Admitting: Family

## 2023-11-21 ENCOUNTER — Other Ambulatory Visit: Payer: Self-pay | Admitting: Family

## 2023-11-22 ENCOUNTER — Other Ambulatory Visit: Payer: Self-pay | Admitting: Family

## 2023-11-28 ENCOUNTER — Ambulatory Visit: Payer: Medicaid Other | Admitting: Family

## 2023-11-28 VITALS — BP 118/70 | HR 83 | Ht 67.0 in | Wt 136.0 lb

## 2023-11-28 DIAGNOSIS — E782 Mixed hyperlipidemia: Secondary | ICD-10-CM | POA: Diagnosis not present

## 2023-11-28 DIAGNOSIS — Z820 Family history of epilepsy and other diseases of the nervous system: Secondary | ICD-10-CM

## 2023-11-28 DIAGNOSIS — M51372 Other intervertebral disc degeneration, lumbosacral region with discogenic back pain and lower extremity pain: Secondary | ICD-10-CM

## 2023-11-28 DIAGNOSIS — I10 Essential (primary) hypertension: Secondary | ICD-10-CM

## 2023-11-28 DIAGNOSIS — G43E19 Chronic migraine with aura, intractable, without status migrainosus: Secondary | ICD-10-CM | POA: Diagnosis not present

## 2023-11-28 DIAGNOSIS — J449 Chronic obstructive pulmonary disease, unspecified: Secondary | ICD-10-CM

## 2023-11-28 DIAGNOSIS — R7303 Prediabetes: Secondary | ICD-10-CM | POA: Diagnosis not present

## 2023-11-28 DIAGNOSIS — R5383 Other fatigue: Secondary | ICD-10-CM

## 2023-11-28 DIAGNOSIS — M4716 Other spondylosis with myelopathy, lumbar region: Secondary | ICD-10-CM

## 2023-11-28 DIAGNOSIS — M4310 Spondylolisthesis, site unspecified: Secondary | ICD-10-CM

## 2023-11-28 DIAGNOSIS — F332 Major depressive disorder, recurrent severe without psychotic features: Secondary | ICD-10-CM

## 2023-11-28 DIAGNOSIS — E559 Vitamin D deficiency, unspecified: Secondary | ICD-10-CM

## 2023-11-28 DIAGNOSIS — M5030 Other cervical disc degeneration, unspecified cervical region: Secondary | ICD-10-CM

## 2023-11-28 DIAGNOSIS — N3946 Mixed incontinence: Secondary | ICD-10-CM

## 2023-11-28 DIAGNOSIS — E538 Deficiency of other specified B group vitamins: Secondary | ICD-10-CM

## 2023-11-28 NOTE — Progress Notes (Addendum)
 Established Patient Office Visit  Subjective:  Patient ID: Kelsey Russo, female    DOB: [birth year]  Age: 58 y.o. MRN: [id number]  Chief Complaint  Patient presents with   Follow-up    3 month follow up    Patient is here today for her 3 months follow up.  She has been feeling fairly well since last appointment.   She does have additional concerns to discuss today.  Has been having some depression, panic attacks, and headaches almost daily. She has also been having back pain as well. She does see ortho for this, but she asks if there are any other options for someone to see for this.   She had MRIs done at [facility] recently, of her entire spine.   Labs are due today. She needs refills.   I have reviewed her active problem list, medication list, allergies, family history, notes from last encounter, lab results for her appointment today.      No other concerns at this time.   Past Medical History:  Diagnosis Date   Anemia    during chemotherapy   Brain tumor (benign) (HCC)    Breast cancer (HCC) 1999   Status post right breast lumpectomy and radiation   Breast cancer (HCC) 11/2019   right breast   COPD (chronic obstructive pulmonary disease) (HCC)    Depression    History of chemotherapy 1999   History of seizure disorder    Hypertension    Personal history of chemotherapy    breast cancer 2021   Personal history of radiation therapy 1999   Pneumonia    PONV (postoperative nausea and vomiting)    vomiting    Past Surgical History:  Procedure Laterality Date   BRAIN SURGERY     Brain tumor resection  1991   BREAST BIOPSY Right 1999   Positive   BREAST BIOPSY Right 11/10/2019   us  bx 10:00 heart marker, positive   BREAST BIOPSY Right 11/10/2019   us  bx axilla, hydromark 4, positive   BREAST BIOPSY Left 11/10/2019   us  bx 2:00, venus marker, neg   MASTECTOMY Right 07/13/2020   positive   PORTACATH PLACEMENT Left 11/30/2019   Procedure: INSERTION  PORT-A-CATH;  Surgeon: Marshall Skeeter, MD;  Location: [facility];  Service: General;  Laterality: Left;   Right breast lumpectomy     SIMPLE MASTECTOMY WITH AXILLARY SENTINEL NODE BIOPSY Right 07/13/2020   Procedure: SIMPLE MASTECTOMY WITH AXILLARY SENTINEL NODE BIOPSY;  Surgeon: Marshall Skeeter, MD;  Location: [facility];  Service: General;  Laterality: Right;   TONSILLECTOMY     TUBAL LIGATION      Social History   Socioeconomic History   Marital status: Widowed    Spouse name: Not on file   Number of children: 2   Years of education: Not on file   Highest education level: Associate degree: academic program  Occupational History   Not on file  Tobacco Use   Smoking status: Every Day    Current packs/day: 0.25    Average packs/day: 0.3 packs/day for 40.0 years (10.0 ttl pk-yrs)    Types: Cigarettes   Smokeless tobacco: Never  Vaping Use   Vaping status: Never Used  Substance and Sexual Activity   Alcohol use: No    Alcohol/week: 0.0 standard drinks of alcohol   Drug use: Not Currently    Frequency: 3.0 times per week    Types: Marijuana   Sexual activity: Not on file  Comment: not asked if sexually active  Other Topics Concern   Not on file  Social History Narrative   Lives at home with husband.   Social Drivers of Corporate investment banker Strain: Not on file  Food Insecurity: No Food Insecurity (09/28/2023)   Hunger Vital Sign    Worried About Running Out of Food in the Last Year: Never true    Ran Out of Food in the Last Year: Never true  Transportation Needs: No Transportation Needs (09/28/2023)   PRAPARE - Administrator, Civil Service (Medical): No    Lack of Transportation (Non-Medical): No  Physical Activity: Not on file  Stress: Not on file  Social Connections: Socially Isolated (09/28/2023)   Social Connection and Isolation Panel [NHANES]    Frequency of Communication with Friends and Family: More than three times a week    Frequency of  Social Gatherings with Friends and Family: Three times a week    Attends Religious Services: Never    Active Member of Clubs or Organizations: No    Attends Banker Meetings: Never    Marital Status: Widowed  Intimate Partner Violence: Not At Risk (09/28/2023)   Humiliation, Afraid, Rape, and Kick questionnaire    Fear of Current or Ex-Partner: No    Emotionally Abused: No    Physically Abused: No    Sexually Abused: No    Family History  Problem Relation Age of Onset   Depression Mother    CAD Father    Hypertension Father    Lung cancer Father    Coronary artery disease Father 55   Schizophrenia Maternal Uncle    Breast cancer Maternal Grandmother    Bipolar disorder Daughter     Allergies  Allergen Reactions   Imitrex [Sumatriptan] Anaphylaxis   Penicillins Rash    Tolerated augmentin  in 2021   Amoxicillin      Review of Systems  Constitutional:  Positive for malaise/fatigue.  Genitourinary:  Positive for frequency and urgency.       Incontinence  Musculoskeletal:  Positive for back pain, joint pain and myalgias.  Neurological:  Positive for headaches.  Psychiatric/Behavioral:  Positive for depression. The patient is nervous/anxious and has insomnia.   All other systems reviewed and are negative.      Objective:   BP 118/70   Pulse 83   Ht 5\' 7"  (1.702 m)   Wt 136 lb (61.7 kg)   SpO2 93%   BMI 21.30 kg/m   Vitals:   11/28/23 1100  BP: 118/70  Pulse: 83  Height: 5\' 7"  (1.702 m)  Weight: 136 lb (61.7 kg)  SpO2: 93%  BMI (Calculated): 21.3    Physical Exam Vitals and nursing note reviewed.  Constitutional:      Appearance: Normal appearance. She is normal weight. She is ill-appearing.  HENT:     Head: Normocephalic and atraumatic.  Eyes:     Extraocular Movements: Extraocular movements intact.     Conjunctiva/sclera: Conjunctivae normal.     Pupils: Pupils are equal, round, and reactive to light.  Cardiovascular:     Rate and  Rhythm: Normal rate and regular rhythm.  Pulmonary:     Effort: Pulmonary effort is normal.  Musculoskeletal:        General: Normal range of motion.     Cervical back: Normal range of motion.  Neurological:     General: No focal deficit present.     Mental Status: She is alert and oriented  to person, place, and time. Mental status is at baseline.  Psychiatric:        Mood and Affect: Mood normal.        Behavior: Behavior normal.        Thought Content: Thought content normal.        Judgment: Judgment normal.      Results for orders placed or performed in visit on 11/28/23  Lipid panel  Result Value Ref Range   Cholesterol, Total 202 (H) 100 - 199 mg/dL   Triglycerides 93 0 - 149 mg/dL   HDL 83 >82 mg/dL   VLDL Cholesterol Cal 16 5 - 40 mg/dL   LDL Chol Calc (NIH) 956 (H) 0 - 99 mg/dL   Chol/HDL Ratio 2.4 0.0 - 4.4 ratio  VITAMIN D  25 Hydroxy (Vit-D Deficiency, Fractures)  Result Value Ref Range   Vit D, 25-Hydroxy 25.0 (L) 30.0 - 100.0 ng/mL  CMP14+EGFR  Result Value Ref Range   Glucose 77 70 - 99 mg/dL   BUN 15 6 - 24 mg/dL   Creatinine, Ser 2.13 0.57 - 1.00 mg/dL   eGFR 68 >08 MV/HQI/6.96   BUN/Creatinine Ratio 16 9 - 23   Sodium 138 134 - 144 mmol/L   Potassium 4.4 3.5 - 5.2 mmol/L   Chloride 102 96 - 106 mmol/L   CO2 23 20 - 29 mmol/L   Calcium 9.3 8.7 - 10.2 mg/dL   Total Protein 6.3 6.0 - 8.5 g/dL   Albumin 4.1 3.8 - 4.9 g/dL   Globulin, Total 2.2 1.5 - 4.5 g/dL   Bilirubin Total <2.9 0.0 - 1.2 mg/dL   Alkaline Phosphatase 63 44 - 121 IU/L   AST 21 0 - 40 IU/L   ALT 11 0 - 32 IU/L  TSH  Result Value Ref Range   TSH 2.540 0.450 - 4.500 uIU/mL  Hemoglobin A1c  Result Value Ref Range   Hgb A1c MFr Bld 5.1 4.8 - 5.6 %   Est. average glucose Bld gHb Est-mCnc 100 mg/dL  Vitamin B12  Result Value Ref Range   Vitamin B-12 426 232 - 1,245 pg/mL  APOE Alzheimer's Risk  Result Value Ref Range   Methodology: Comment    APO E Genotyping Result: E2/E3     Interpretation: Comment    Comment: Comment     Recent Results (from the past 2160 hours)  Lipid panel     Status: Abnormal   Collection Time: 11/28/23 12:21 PM  Result Value Ref Range   Cholesterol, Total 202 (H) 100 - 199 mg/dL   Triglycerides 93 0 - 149 mg/dL   HDL 83 >52 mg/dL   VLDL Cholesterol Cal 16 5 - 40 mg/dL   LDL Chol Calc (NIH) 841 (H) 0 - 99 mg/dL   Chol/HDL Ratio 2.4 0.0 - 4.4 ratio    Comment:                                   T. Chol/HDL Ratio                                             Men  Women  1/2 Avg.Risk  3.4    3.3                                   Avg.Risk  5.0    4.4                                2X Avg.Risk  9.6    7.1                                3X Avg.Risk 23.4   11.0   VITAMIN D  25 Hydroxy (Vit-D Deficiency, Fractures)     Status: Abnormal   Collection Time: 11/28/23 12:21 PM  Result Value Ref Range   Vit D, 25-Hydroxy 25.0 (L) 30.0 - 100.0 ng/mL    Comment: Vitamin D  deficiency has been defined by the Institute of Medicine and an Endocrine Society practice guideline as a level of serum 25-OH vitamin D  less than 20 ng/mL (1,2). The Endocrine Society went on to further define vitamin D  insufficiency as a level between 21 and 29 ng/mL (2). 1. IOM (Institute of Medicine). 2010. Dietary reference    intakes for calcium and D. [place]: The    [employer]. 2. Holick MF, Binkley [place], Bischoff-Ferrari HA, et al.    Evaluation, treatment, and prevention of vitamin D     deficiency: an Endocrine Society clinical practice    guideline. JCEM. 2011 Jul; 96(7):1911-30.   CMP14+EGFR     Status: None   Collection Time: 11/28/23 12:21 PM  Result Value Ref Range   Glucose 77 70 - 99 mg/dL   BUN 15 6 - 24 mg/dL   Creatinine, Ser 0.45 0.57 - 1.00 mg/dL   eGFR 68 >40 JW/JXB/1.47   BUN/Creatinine Ratio 16 9 - 23   Sodium 138 134 - 144 mmol/L   Potassium 4.4 3.5 - 5.2 mmol/L   Chloride 102 96 - 106 mmol/L   CO2  23 20 - 29 mmol/L   Calcium 9.3 8.7 - 10.2 mg/dL   Total Protein 6.3 6.0 - 8.5 g/dL   Albumin 4.1 3.8 - 4.9 g/dL   Globulin, Total 2.2 1.5 - 4.5 g/dL   Bilirubin Total <8.2 0.0 - 1.2 mg/dL   Alkaline Phosphatase 63 44 - 121 IU/L   AST 21 0 - 40 IU/L   ALT 11 0 - 32 IU/L  TSH     Status: None   Collection Time: 11/28/23 12:21 PM  Result Value Ref Range   TSH 2.540 0.450 - 4.500 uIU/mL  Hemoglobin A1c     Status: None   Collection Time: 11/28/23 12:21 PM  Result Value Ref Range   Hgb A1c MFr Bld 5.1 4.8 - 5.6 %    Comment:          Prediabetes: 5.7 - 6.4          Diabetes: >6.4          Glycemic control for adults with diabetes: <7.0    Est. average glucose Bld gHb Est-mCnc 100 mg/dL  Vitamin B12     Status: None   Collection Time: 11/28/23 12:21 PM  Result Value Ref Range   Vitamin B-12 426 232 - 1,245 pg/mL  APOE Alzheimer's Risk     Status: None   Collection Time: 11/28/23 12:21  PM  Result Value Ref Range   Methodology: Comment     Comment: Patient DNA is assayed for the APOE genotype by PCR amplification of a specific region in exon 4 of the APOE gene followed by digestion with restriction enzyme Hha I and separation of fragments by polyacrylamide gel electrophoresis. This approach allows the APOE E2, E3, and E4 alleles to be distinguished. Analytical sensitivity and specificity are >99.5%. Individuals are interpreted as having one of the following genotypes: E2/E2, E3/E3, E4/E4, E2/E3, E2/E4, E3/E4.    APO E Genotyping Result: E2/E3    Interpretation: Comment     Comment: Negative for the APOE4 variant that is associated with increased risk for late onset Alzheimer's disease (AD). APOE2 may have some protective effect against the development of AD.  RECOMMENDATIONS Genetic counseling is recommended. Due to the lack of measures to prevent the development of AD, the ACMG/NSGC guidelines do not recommend presymptomatic testing, but if it is performed,  guidelines are provided Gara July JS et al. 2011). The APOE Genotyping: Alzheimer's Risk test is not recommended for children.  NOTE: This is not a diagnostic test. Results should be interpreted along with clinical findings and other data. This test evaluates only for the APOE genotype and cannot detect genetic abnormalities elsewhere in the genome. It should be realized that there are possible sources of error including sample misidentification, rare technical errors, trace contamination of PCR reactions, and rare genetic variants that may interfere with analysis.  For inquiries or genet ic consultation, please call [facility] at [phone].    Comment: Comment     Comment: INFORMATION ABOUT THE APOE GENOTYPE AND ALZHEIMER'S DISEASE  Alzheimer's disease (AD) is the most common form of dementia in the elderly and currently affects more than 5 million Americans. It is a progressive neurodegenerative disorder with brain findings of plaques and neurofibrillary tangles containing beta-amyloid and tau protein respectively.  The predominant form of AD is late onset (age > 69-65), which can be familial (15-20%) or sporadic. The APOE4 variant increases the risk for late onset AD and may contribute to the pathology of the disease. This risk is increased by approximately 2 to 3-fold for individuals with one copy of the APOE4 variant and by approximately 10 to15-fold for individuals with two copies of this variant (E4/E4 genotype). The APOE2 variant has some protective effect against development of late onset AD. The lifetime risk for late onset AD is approximately 10-12% in the general population, though it is higher in women than men and doubles  when there is a first degree relative with this disorder. The lifetime risk is approximately 9% for individuals negative for APOE4, and for individuals with E4/E4 may be as high as 25% for males and 45% for females. Among patients with late  onset AD, the presence of APOE4 may lead to earlier development of symptoms.  However, APOE4 is neither necessary nor sufficient for the development of AD. Approximately 30-50% of patients with late onset AD do not have an APOE4 allele.  APOE4 is common, with 25% of the general population having one copy and 1% having two copies of this variant. Among patients with late onset AD, 50-70% are positive for APOE4.  The development of late onset AD is influenced by many factors other than APOE4 including age, gender, family history, level of education and history of head trauma. Midlife cardiovascular risk factors in individuals with APOE4 also increase risk for cognitive decline. A number of genetic influences in addition to APOE4 h  ave also been reported and are under investigation.  This test was developed and its performance characteristics determined by [facility]. It has not been cleared or approved by the Food and Drug Administration. The FDA has determined that such clearance or approval is not necessary.  REFERENCES  Altmann A et al. Sex modifies the APOE-related risk of developing Alzheimer disease. Annal Neurol 2014;75(4):563-573  Bird TD. Alzheimer Disease Overview. GeneReviews (internet). Pagon RA et al., editors. [place]: [employer] , [place], [place]. Last revised 2014.  Cherilyn Corn et al. Genetic counseling and testing for Alzheimer disease: Joint practice guidelines of the [employer] of [employer] and the [employer] of [employer]. Genet in Med 2011;13(6)597-605.  Schipper HM. Apolipoprotein E: Implications for AD neurobiology, epidemiology and risk assessment. Neurobiology of Aging 2011;32:778-790        Assessment & Plan:   Problem List Items Addressed This Visit       Cardiovascular and Mediastinum   Essential hypertension   Blood pressure well controlled with current medications.  Continue current therapy.  Will  reassess at follow up.        Relevant Orders   CMP14+EGFR (Completed)   CBC with Differential/Platelet     Other   Vitamin D  deficiency, unspecified   Checking labs today.  Will continue supplements as needed.        Relevant Orders   VITAMIN D  25 Hydroxy (Vit-D Deficiency, Fractures) (Completed)   CMP14+EGFR (Completed)   CBC with Differential/Platelet   B12 deficiency due to diet   Checking labs today.  Will continue supplements as needed.        Relevant Orders   CMP14+EGFR (Completed)   Vitamin B12 (Completed)   CBC with Differential/Platelet   Mixed hyperlipidemia   Checking labs today.  Continue current therapy for lipid control. Will modify as needed based on labwork results.        Relevant Orders   Lipid panel (Completed)   CMP14+EGFR (Completed)   CBC with Differential/Platelet   Prediabetes   A1C Continues to be in prediabetic ranges.  Will reassess at follow up after next lab check.  Patient counseled on dietary choices and verbalized understanding.  Patient educated on foods that contain carbohydrates and the need to decrease intake.  We discussed prediabetes, and what it means and the need for strict dietary control to prevent progression to type 2 diabetes.  Advised to decrease intake of sugary drinks, including sodas, sweet tea, and some juices, and of starch and sugar heavy foods (ie., potatoes, rice, bread, pasta, desserts). She verbalizes understanding and agreement with the changes discussed today.        Relevant Orders   CMP14+EGFR (Completed)   Hemoglobin A1c (Completed)   CBC with Differential/Platelet   Mixed stress and urge urinary incontinence   Sending orders for urinary incontinence supplies.  Patient will need briefs, might also benefit from wipes and underpads as well.       Other Visit Diagnoses       Intractable chronic migraine with aura and without status migrainosus    -  Primary   Relevant Orders   CMP14+EGFR  (Completed)   CBC with Differential/Platelet     Severe episode of recurrent major depressive disorder, without psychotic features (HCC)       Relevant Orders   Ambulatory referral to Psychiatry   CMP14+EGFR (Completed)   CBC with Differential/Platelet     Family history of Alzheimer's disease       Checking  APOE today.  Will call with results when available.   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   Total time spent: 30 minutes  Trenda Frisk, FNP  11/28/2023   This document may have been prepared by [facility] Voice Recognition software and as such may include unintentional dictation errors.

## 2023-12-02 ENCOUNTER — Encounter: Payer: Self-pay | Admitting: Family

## 2023-12-04 ENCOUNTER — Other Ambulatory Visit: Payer: Self-pay | Admitting: Family

## 2023-12-06 LAB — CMP14+EGFR
ALT: 11 IU/L (ref 0–32)
AST: 21 IU/L (ref 0–40)
Albumin: 4.1 g/dL (ref 3.8–4.9)
Alkaline Phosphatase: 63 IU/L (ref 44–121)
BUN/Creatinine Ratio: 16 (ref 9–23)
BUN: 15 mg/dL (ref 6–24)
Bilirubin Total: <0.2 mg/dL (ref 0.0–1.2)
CO2: 23 mmol/L (ref 20–29)
Calcium: 9.3 mg/dL (ref 8.7–10.2)
Chloride: 102 mmol/L (ref 96–106)
Creatinine, Ser: 0.96 mg/dL (ref 0.57–1.00)
Globulin, Total: 2.2 g/dL (ref 1.5–4.5)
Glucose: 77 mg/dL (ref 70–99)
Potassium: 4.4 mmol/L (ref 3.5–5.2)
Sodium: 138 mmol/L (ref 134–144)
Total Protein: 6.3 g/dL (ref 6.0–8.5)
eGFR: 68 mL/min/{1.73_m2} (ref >59)

## 2023-12-06 LAB — APOE ALZHEIMER'S RISK

## 2023-12-06 LAB — LIPID PANEL
Chol/HDL Ratio: 2.4 ratio (ref 0.0–4.4)
Cholesterol, Total: 202 mg/dL — ABNORMAL HIGH (ref 100–199)
HDL: 83 mg/dL (ref >39)
LDL Chol Calc (NIH): 103 mg/dL — ABNORMAL HIGH (ref 0–99)
Triglycerides: 93 mg/dL (ref 0–149)
VLDL Cholesterol Cal: 16 mg/dL (ref 5–40)

## 2023-12-06 LAB — TSH: TSH: 2.54 u[IU]/mL (ref 0.450–4.500)

## 2023-12-06 LAB — HEMOGLOBIN A1C
Est. average glucose Bld gHb Est-mCnc: 100 mg/dL
Hgb A1c MFr Bld: 5.1 % (ref 4.8–5.6)

## 2023-12-06 LAB — VITAMIN D 25 HYDROXY (VIT D DEFICIENCY, FRACTURES): Vit D, 25-Hydroxy: 25 ng/mL — ABNORMAL LOW (ref 30.0–100.0)

## 2023-12-06 LAB — VITAMIN B12: Vitamin B-12: 426 pg/mL (ref 232–1245)

## 2023-12-09 ENCOUNTER — Telehealth: Payer: Self-pay | Admitting: Family

## 2023-12-09 NOTE — Telephone Encounter (Signed)
 Patient left VM requesting that someone call her back and explain her lab results.  Also asked if Marchelle Folks can send her in some stronger pain meds for a few days because she had a fall. Please advise.

## 2023-12-13 NOTE — Progress Notes (Signed)
 Patient notified

## 2023-12-13 NOTE — Telephone Encounter (Signed)
 I can't send her anything stronger.  Dr. Welton Flakes won't let met me.

## 2023-12-13 NOTE — Telephone Encounter (Signed)
 Patient called again about needing some pain meds and wanting her lab results. Please advise.

## 2023-12-16 ENCOUNTER — Inpatient Hospital Stay
Admission: RE | Admit: 2023-12-16 | Discharge: 2023-12-16 | Disposition: A | Discharge status: Home / Self Care | Payer: Self-pay | Source: Ambulatory Visit | Attending: Neurosurgery | Admitting: Neurosurgery

## 2023-12-16 ENCOUNTER — Other Ambulatory Visit: Payer: Self-pay | Admitting: Family Medicine

## 2023-12-16 DIAGNOSIS — Z049 Encounter for examination and observation for unspecified reason: Secondary | ICD-10-CM

## 2023-12-18 NOTE — Progress Notes (Unsigned)
 Referring Physician:  Miki Kins, FNP [id number] [place],  [place] [address]  Primary Physician:  Kelsey Kins, FNP  History of Present Illness: 12/19/2023 [name] is here today with a chief complaint of neck and lower back pain.  Additionally, she has had issues with her balance as well as with tremors.  Most of this has been occurring over the past several months.  She also reported some issues with memory loss.  She has had some issues with coordination of her right hand.  She is dropped some items.  She has had neck pain for years, though it has been worsening recently.  Movement makes her pain worse.  Laying down makes it better.  Bowel/Bladder Dysfunction: none  Conservative measures:  Physical therapy:  has not participated in PT Multimodal medical therapy including regular antiinflammatories: tramadol, Gabapentin, Methocarbamol Injections: no epidural steroid injections  Past Surgery: none  Kelsey Russo has mild symptoms of cervical myelopathy.  The symptoms are causing a significant impact on the patient's life.   I have utilized the care everywhere function in epic to review the outside records available from external health systems.  Review of Systems:  A 10 point review of systems is negative, except for the pertinent positives and negatives detailed in the HPI.  Past Medical History: Past Medical History:  Diagnosis Date   Anemia    during chemotherapy   Brain tumor (benign) (HCC)    Breast cancer (HCC) 1999   Status post right breast lumpectomy and radiation   Breast cancer (HCC) 11/2019   right breast   COPD (chronic obstructive pulmonary disease) (HCC)    Depression    History of chemotherapy 1999   History of seizure disorder    Hypertension    Personal history of chemotherapy    breast cancer 2021   Personal history of radiation therapy 1999   Pneumonia    PONV (postoperative nausea and vomiting)    vomiting     Past Surgical History: Past Surgical History:  Procedure Laterality Date   BRAIN SURGERY     Brain tumor resection  1991   BREAST BIOPSY Right 1999   Positive   BREAST BIOPSY Right 11/10/2019   [place] bx 10:00 heart marker, positive   BREAST BIOPSY Right 11/10/2019   [place] bx axilla, hydromark 4, positive   BREAST BIOPSY Left 11/10/2019   [place] bx 2:00, venus marker, neg   MASTECTOMY Right 07/13/2020   positive   PORTACATH PLACEMENT Left 11/30/2019   Procedure: INSERTION PORT-A-CATH;  Surgeon: Earline Mayotte, MD;  Location: [facility];  Service: General;  Laterality: Left;   Right breast lumpectomy     SIMPLE MASTECTOMY WITH AXILLARY SENTINEL NODE BIOPSY Right 07/13/2020   Procedure: SIMPLE MASTECTOMY WITH AXILLARY SENTINEL NODE BIOPSY;  Surgeon: Earline Mayotte, MD;  Location: [facility];  Service: General;  Laterality: Right;   TONSILLECTOMY     TUBAL LIGATION      Allergies: Allergies as of 12/19/2023 - Review Complete 12/19/2023  Allergen Reaction Noted   Imitrex [sumatriptan] Anaphylaxis 04/17/2018   Penicillins Rash 07/04/2015   Amoxicillin  10/16/2023    Medications:  Current Outpatient Medications:    albuterol (VENTOLIN HFA) 108 (90 Base) MCG/ACT inhaler, Inhale 1-2 puffs into the lungs every 6 (six) hours as needed for wheezing or shortness of breath., Disp: , Rfl:    alendronate (FOSAMAX) 70 MG tablet, TAKE ONE TABLET (70 MG TOTAL) BY MOUTH ONCE A WEEK.  TAKE WITH A FULL GLASS OF WATER ON AN EMPTY STOMACH., Disp: 4 tablet, Rfl: 3   azelastine (ASTELIN) 0.1 % nasal spray, Place 1 spray into both nostrils 2 (two) times daily., Disp: , Rfl:    clonazePAM (KLONOPIN) 1 MG tablet, Take 1 tablet (1 mg total) by mouth 3 (three) times daily as needed for anxiety., Disp: 90 tablet, Rfl: 2   gabapentin (NEURONTIN) 300 MG capsule, TAKE ONE CAPSULE (300 MG TOTAL) BY MOUTH TWO TIMES DAILY., Disp: 90 capsule, Rfl: 1   hydrOXYzine (ATARAX) 25 MG tablet, TAKE ONE TABLET (25 MG TOTAL)  BY MOUTH THREE (THREE) TIMES DAILY., Disp: 90 tablet, Rfl: 3   methocarbamol (ROBAXIN) 500 MG tablet, Take 1 tablet (500 mg total) by mouth every 6 (six) hours as needed for muscle spasms., Disp: 120 tablet, Rfl: 3   sertraline (ZOLOFT) 100 MG tablet, TAKE TWO TABLETS (200 MG TOTAL) BY MOUTH DAILY., Disp: 180 tablet, Rfl: 0   tamoxifen (NOLVADEX) 20 MG tablet, TAKE ONE TABLET BY MOUTH ONCE A DAY, Disp: 30 tablet, Rfl: 3   tiZANidine (ZANAFLEX) 4 MG tablet, Take 4 mg by mouth at bedtime., Disp: , Rfl:    traMADol (ULTRAM) 50 MG tablet, TAKE TWO TABLETS BY MOUTH TWICE DAILY AS NEEDED FOR SEVERE PAIN (NOTE DOSE INCREASE), Disp: 120 tablet, Rfl: 1   traZODone (DESYREL) 50 MG tablet, TAKE ONE TO TWO TABLETS BY MOUTH EACH NIGHT AT BEDTIME AS NEEDED FOR INSOMNIA, Disp: 180 tablet, Rfl: 3   varenicline (CHANTIX) 1 MG tablet, TAKE ONE TABLET (1 MG TOTAL) BY MOUTH TWO TIMES DAILY., Disp: 60 tablet, Rfl: 2   zolpidem (AMBIEN) 10 MG tablet, Take 1 tablet (10 mg total) by mouth at bedtime as needed for sleep., Disp: 90 tablet, Rfl: 1  Social History: Social History   Tobacco Use   Smoking status: Every Day    Current packs/day: 0.25    Average packs/day: 0.3 packs/day for 40.0 years (10.0 ttl pk-yrs)    Types: Cigarettes   Smokeless tobacco: Never  Vaping Use   Vaping status: Never Used  Substance Use Topics   Alcohol use: No    Alcohol/week: 0.0 standard drinks of alcohol   Drug use: Not Currently    Frequency: 3.0 times per week    Types: Marijuana    Family Medical History: Family History  Problem Relation Age of Onset   Depression Mother    CAD Father    Hypertension Father    Lung cancer Father    Coronary artery disease Father 9   Breast cancer Maternal Grandmother     Physical Examination: Vitals:   12/19/23 1304  BP: (!) 140/84    General: Patient is in no apparent distress. Attention to examination is appropriate.  Neck:   Supple.  Full range of motion with  discomfort.  Respiratory: Patient is breathing without any difficulty.   NEUROLOGICAL:     Awake, alert, oriented to person, place, and time.  Speech is clear and fluent.   Cranial Nerves: Pupils equal round and reactive to light.  Facial tone is symmetric.  Facial sensation is symmetric. Shoulder shrug is symmetric. Tongue protrusion is midline.  She has significant bilateral tremor.  Strength: Side Biceps Triceps Deltoid Interossei Grip Wrist Ext. Wrist Flex.  R 5 5 5 5 5 5 5   L 5 5 5 5 5 5 5    Side Iliopsoas Quads Hamstring PF DF EHL  R 5 5 5 5 5 5   L 5  5 5 5 5 5    Reflexes are 1+ and symmetric at the biceps, triceps, brachioradialis, patella and achilles.   Hoffman's is absent.   Bilateral upper and lower extremity sensation is intact to light touch.    No evidence of dysmetria noted.  Gait is normal.     Medical Decision Making  Imaging: MRI C spine 11/13/2023 Moderate stenosis at C3-4 from retrolisthesis and disc herniation.  Foraminal narrowing at multiple cervical levels most prominent at C5-6 and C6-7.  1.3 cm cystic lesion at the right anterior C4 vertebral body margin with sharp margins and smooth bone remodeling.  MRI lumbar spine on October 18, 2023 shows L5-S1 bulging disc and facet arthropathy with moderate left and moderate right foraminal stenosis.  L4-5 7 mm bulging disc effacing the thecal sac and barely abutting the descending L5 nerve roots bilaterally.  MRI thoracic spine on October 18, 2023 shows no evidence of compressive thoracic disc disease or spinal canal or foraminal stenosis.   I have personally reviewed the images and agree with the above interpretation.  Assessment and Plan: Kelsey Russo is a pleasant 58 y.o. female with memory loss, essential tremor, and possible cervical myelopathy.  She has moderate stenosis at C3-4.  She also has foraminal stenosis C5-6 and C6-7 though she has no significant radicular symptoms at this time.  I would like  to refer her to neurology for evaluation before consideration of C3-4 anterior cervical discectomy and fusion for her cervical stenosis.  We discussed her back pain as well.  I have recommended that she continue conservative management with exercises, NSAIDs, muscle relaxants.  I referred her to physical therapy to help with her neck pain.  I would like to see her back in 3 to 4 months.  I spent a total of 30 minutes in this patient's care today. This time was spent reviewing pertinent records including imaging studies, obtaining and confirming history, performing a directed evaluation, formulating and discussing my recommendations, and documenting the visit within the medical record.        Thank you for involving me in the care of this patient.      Garv Kuechle K. Mont Antis MD, [facility]

## 2023-12-19 ENCOUNTER — Ambulatory Visit (INDEPENDENT_AMBULATORY_CARE_PROVIDER_SITE_OTHER): Admitting: Neurosurgery

## 2023-12-19 ENCOUNTER — Encounter: Payer: Self-pay | Admitting: Neurosurgery

## 2023-12-19 VITALS — BP 140/84 | Ht 67.0 in | Wt 131.0 lb

## 2023-12-19 DIAGNOSIS — M542 Cervicalgia: Secondary | ICD-10-CM

## 2023-12-19 DIAGNOSIS — G959 Disease of spinal cord, unspecified: Secondary | ICD-10-CM

## 2023-12-19 DIAGNOSIS — M4802 Spinal stenosis, cervical region: Secondary | ICD-10-CM

## 2023-12-19 DIAGNOSIS — R413 Other amnesia: Secondary | ICD-10-CM

## 2023-12-19 DIAGNOSIS — M545 Low back pain, unspecified: Secondary | ICD-10-CM | POA: Diagnosis not present

## 2023-12-19 DIAGNOSIS — R251 Tremor, unspecified: Secondary | ICD-10-CM

## 2023-12-19 DIAGNOSIS — G8929 Other chronic pain: Secondary | ICD-10-CM

## 2023-12-21 ENCOUNTER — Other Ambulatory Visit: Payer: Self-pay | Admitting: Family

## 2023-12-25 ENCOUNTER — Ambulatory Visit: Admitting: Physical Therapy

## 2023-12-28 ENCOUNTER — Other Ambulatory Visit: Payer: Self-pay | Admitting: Family
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
## 2023-12-30 ENCOUNTER — Other Ambulatory Visit: Payer: Self-pay | Admitting: Family

## 2023-12-31 ENCOUNTER — Other Ambulatory Visit: Payer: Self-pay

## 2023-12-31 ENCOUNTER — Ambulatory Visit (INDEPENDENT_AMBULATORY_CARE_PROVIDER_SITE_OTHER): Admitting: Psychiatry

## 2023-12-31 ENCOUNTER — Encounter: Payer: Self-pay | Admitting: Psychiatry

## 2023-12-31 ENCOUNTER — Other Ambulatory Visit: Payer: Self-pay | Admitting: Family

## 2023-12-31 VITALS — BP 110/73 | HR 81 | Temp 98.2°F | Ht 67.0 in | Wt 133.2 lb

## 2023-12-31 DIAGNOSIS — F4310 Post-traumatic stress disorder, unspecified: Secondary | ICD-10-CM | POA: Diagnosis not present

## 2023-12-31 DIAGNOSIS — Z79899 Other long term (current) drug therapy: Secondary | ICD-10-CM

## 2023-12-31 DIAGNOSIS — F411 Generalized anxiety disorder: Secondary | ICD-10-CM

## 2023-12-31 DIAGNOSIS — F333 Major depressive disorder, recurrent, severe with psychotic symptoms: Secondary | ICD-10-CM

## 2023-12-31 MED ORDER — ALBUTEROL SULFATE HFA 108 (90 BASE) MCG/ACT IN AERS
1.0000 | INHALATION_SPRAY | Freq: Four times a day (QID) | RESPIRATORY_TRACT | 3 refills | Status: AC | PRN
Start: 2023-12-31 — End: ?

## 2023-12-31 NOTE — Patient Instructions (Signed)
 Continue sertraline  200 mg daily  Obtain EKG - please call [phone]  to make an appointment  After obtaining EKG, will plan to switch to mirtazapine  Referral for therapy  Next appointment: 6/17 at 11:30

## 2024-01-01 ENCOUNTER — Ambulatory Visit: Attending: Neurosurgery | Admitting: Physical Therapy

## 2024-01-01 ENCOUNTER — Telehealth: Payer: Self-pay | Admitting: Physical Therapy

## 2024-01-01 DIAGNOSIS — M542 Cervicalgia: Secondary | ICD-10-CM | POA: Insufficient documentation

## 2024-01-01 DIAGNOSIS — M6281 Muscle weakness (generalized): Secondary | ICD-10-CM | POA: Insufficient documentation

## 2024-01-01 NOTE — Therapy (Deleted)
 OUTPATIENT PHYSICAL THERAPY CERVICAL EVALUATION   Patient Name: Kelsey Russo MRN: [id number] DOB:[birth year], 58 y.o., female Today's Date: 01/01/2024  END OF SESSION:   Past Medical History:  Diagnosis Date   Anemia    during chemotherapy   Brain tumor (benign) (HCC)    Breast cancer (HCC) 1999   Status post right breast lumpectomy and radiation   Breast cancer (HCC) 11/2019   right breast   COPD (chronic obstructive pulmonary disease) (HCC)    Depression    History of chemotherapy 1999   History of seizure disorder    Hypertension    Personal history of chemotherapy    breast cancer 2021   Personal history of radiation therapy 1999   Pneumonia    PONV (postoperative nausea and vomiting)    vomiting   Past Surgical History:  Procedure Laterality Date   BRAIN SURGERY     Brain tumor resection  1991   BREAST BIOPSY Right 1999   Positive   BREAST BIOPSY Right 11/10/2019   us  bx 10:00 heart marker, positive   BREAST BIOPSY Right 11/10/2019   us  bx axilla, hydromark 4, positive   BREAST BIOPSY Left 11/10/2019   us  bx 2:00, venus marker, neg   MASTECTOMY Right 07/13/2020   positive   PORTACATH PLACEMENT Left 11/30/2019   Procedure: INSERTION PORT-A-CATH;  Surgeon: Marshall Skeeter, MD;  Location: [facility] ORS;  Service: General;  Laterality: Left;   Right breast lumpectomy     SIMPLE MASTECTOMY WITH AXILLARY SENTINEL NODE BIOPSY Right 07/13/2020   Procedure: SIMPLE MASTECTOMY WITH AXILLARY SENTINEL NODE BIOPSY;  Surgeon: Marshall Skeeter, MD;  Location: [facility] OR[facility];  Service: General;  Laterality: Right;   TONSILLECTOMY     TUBAL LIGATION     Patient Active Problem List   Diagnosis Date Noted   Abscess of back 10/01/2023   Cellulitis of back 09/28/2023   Depression with anxiety 09/28/2023   Primary insomnia 08/31/2023   Vitamin D  deficiency, unspecified 06/29/2023   B12 deficiency due to diet 06/29/2023   Mixed hyperlipidemia 06/29/2023   Prediabetes  06/29/2023   Generalized anxiety disorder 02/12/2023   Caregiver stress 12/21/2022   Tobacco use disorder, continuous 12/21/2022   Chronic obstructive pulmonary disease, unspecified COPD type (HCC) 10/12/2022   High risk medication use 02/19/2022   Syncope and collapse 02/23/2020   Palpitations 02/23/2020   Essential hypertension 02/23/2020   Goals of care, counseling/discussion 11/26/2019   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  Rationale for Evaluation and Treatment: Rehabilitation  ONSET DATE: ***  SUBJECTIVE:  SUBJECTIVE STATEMENT: *** Hand dominance: {MISC; OT HAND [phone]}  PERTINENT HISTORY:  PMX of COPD, HTN, Tobacco use, GAD, HLD, Prediabetes, insomnia, depression with anxiety  PAIN:  Are you having pain? Yes: {yespain:27235::"NPRS scale: ***","Pain location: ***","Pain description: ***","Aggravating factors: ***","Relieving factors: ***"}  PRECAUTIONS: {Therapy precautions:24002}  RED FLAGS: {PT Red Flags:29287}     WEIGHT BEARING RESTRICTIONS: {Yes ***/No:24003}  FALLS:  Has patient fallen in last 6 months? {fallsyesno:27318}  LIVING ENVIRONMENT: Lives with: {[facility] lives with:25569::"lives with their family"} Lives in: {Lives in:25570} Stairs: {opstairs:27293} Has following equipment at home: {Assistive devices:23999}  OCCUPATION: ***  PLOF: {PLOF:24004}  PATIENT GOALS: ***  NEXT MD VISIT: 03/26/24  OBJECTIVE:  Note: Objective measures were completed at Evaluation unless otherwise noted.  DIAGNOSTIC FINDINGS:  From 12/07/22 DG C Spine  IMPRESSION: Moderate multilevel degenerative disc disease. No acute abnormality seen.  PATIENT SURVEYS:  NDI ***  COGNITION: Overall cognitive status: {cognition:24006}  SENSATION: {sensation:27233}  POSTURE: {posture:25561}  PALPATION: ***   CERVICAL ROM:   {AROM/PROM:27142} ROM A/PROM (deg) eval  Flexion   Extension   Right lateral flexion   Left lateral flexion   Right rotation   Left rotation    (Blank rows = not tested)  UPPER EXTREMITY ROM:  {AROM/PROM:27142} ROM Right eval Left eval  Shoulder flexion    Shoulder extension    Shoulder abduction    Shoulder adduction    Shoulder extension    Shoulder internal rotation    Shoulder external rotation    Elbow flexion    Elbow extension    Wrist flexion    Wrist extension    Wrist ulnar deviation    Wrist radial deviation    Wrist pronation    Wrist supination     (Blank rows = not tested)  UPPER EXTREMITY MMT:  MMT Right eval Left eval  Shoulder flexion    Shoulder extension    Shoulder abduction    Shoulder adduction    Shoulder extension    Shoulder internal rotation    Shoulder external rotation    Middle trapezius    Lower trapezius    Elbow flexion    Elbow extension    Wrist flexion    Wrist extension    Wrist ulnar deviation    Wrist radial deviation    Wrist pronation    Wrist supination    Grip strength     (Blank rows = not tested)  CERVICAL SPECIAL TESTS:  Neck flexor muscle endurance test: {pos/neg:25243}, Spurling's test: {pos/neg:25243}, and Distraction test: {pos/neg:25243}    TREATMENT DATE: 01/01/24   SELF CARE Patient instructed in plan of care, findings for evaluation, and ways of physical therapy may improve their function and quality of life.     PATIENT EDUCATION: Education details: POC Person educated: Patient Education method: Explanation Education comprehension: verbalized understanding   HOME EXERCISE PROGRAM: Establish visit 2     ASSESSMENT:  CLINICAL IMPRESSION: Patient is a *** y.o. *** who was seen today for physical therapy evaluation and treatment for ***.   OBJECTIVE IMPAIRMENTS: {opptimpairments:25111}.   ACTIVITY LIMITATIONS: {activitylimitations:27494}  PARTICIPATION LIMITATIONS: {participationrestrictions:25113}  PERSONAL FACTORS: {Personal factors:25162} are also affecting patient's functional outcome.   REHAB POTENTIAL: {rehabpotential:25112}  CLINICAL DECISION MAKING: {clinical decision making:25114}  EVALUATION COMPLEXITY: {Evaluation complexity:25115}   GOALS: Goals reviewed with patient? {yes/no:20286}  SHORT TERM GOALS: Target date: ***     Patient will be independent in home exercise program to improve strength/mobility for better functional independence with ADLs. Baseline:  No HEP currently  Goal status: INITIAL   LONG TERM GOALS: Target date: ***   Pt will demonstrate improved *** ROM in order to allow for improved tolerance with daily activities without increase in pain or discomfort  Baseline: *** Goal status: INITIAL  2. Patient will demonstrate neck flexor endurance test time to *** or greater to indicate improved strength in stability muscles in cervical region for improved tolerance to prolonged postures for reading, driving, desk work, etc Baseline: *** Goal status: INITIAL  3. Patient will impove Neck Disability Index score by 20% to demonstrate improved disability with ADL's including improved sleeping tolerance, sitting tolerance, etc for better mobility at home and work. Baseline:  *** Goal status: INITIAL  4.  *** Baseline:  Goal status: INITIAL   PLAN:  PT FREQUENCY: 2x/week  PT DURATION: 8 weeks  PLANNED INTERVENTIONS: 97750- Physical Performance Testing, 97110-Therapeutic exercises, 97530- Therapeutic activity, 97112- Neuromuscular re-education, 97535- Self Care, 45409- Manual therapy, [phone]- Gait training, Balance training, Stair training,  Taping, Dry Needling, Joint mobilization, Spinal mobilization, Cryotherapy, and Moist heat  PLAN FOR NEXT SESSION: ***   Edwina Gram, PT 01/01/2024, 8:13 AM

## 2024-01-01 NOTE — Telephone Encounter (Signed)
 No voice mail set up to leave message about missed appointment.

## 2024-01-02 ENCOUNTER — Other Ambulatory Visit: Payer: Self-pay | Admitting: Family

## 2024-01-02 MED ORDER — AZELASTINE HCL 0.1 % NA SOLN: 1.0000 | Freq: Two times a day (BID) | NASAL | 3 refills | Status: DC

## 2024-01-02 MED ORDER — TIZANIDINE HCL 4 MG PO TABS: 4.0000 mg | ORAL_TABLET | Freq: Every day | ORAL | 3 refills | Status: DC

## 2024-01-08 ENCOUNTER — Encounter (HOSPITAL_COMMUNITY): Payer: Self-pay

## 2024-01-08 ENCOUNTER — Ambulatory Visit: Admitting: Physical Therapy

## 2024-01-13 ENCOUNTER — Encounter: Payer: Self-pay | Admitting: Family

## 2024-01-13 NOTE — Assessment & Plan Note (Signed)
 Checking labs today.  Continue current therapy for lipid control. Will modify as needed based on labwork results.

## 2024-01-13 NOTE — Assessment & Plan Note (Signed)
 Checking labs today.  Will continue supplements as needed.

## 2024-01-13 NOTE — Assessment & Plan Note (Signed)
 Blood pressure well controlled with current medications.  Continue current therapy.  Will reassess at follow up.

## 2024-01-13 NOTE — Assessment & Plan Note (Signed)

## 2024-01-14 ENCOUNTER — Ambulatory Visit
Admission: RE | Admit: 2024-01-14 | Discharge: 2024-01-14 | Disposition: A | Discharge status: Home / Self Care | Source: Ambulatory Visit | Attending: Psychiatry | Admitting: Psychiatry

## 2024-01-14 ENCOUNTER — Encounter

## 2024-01-14 DIAGNOSIS — Z79899 Other long term (current) drug therapy: Secondary | ICD-10-CM | POA: Insufficient documentation

## 2024-01-15 ENCOUNTER — Ambulatory Visit
Admission: RE | Admit: 2024-01-15 | Discharge: 2024-01-15 | Disposition: A | Discharge status: Home / Self Care | Payer: Medicaid Other | Source: Ambulatory Visit | Attending: Oncology | Admitting: Oncology

## 2024-01-15 ENCOUNTER — Other Ambulatory Visit: Payer: Self-pay | Admitting: Family

## 2024-01-15 DIAGNOSIS — Z9011 Acquired absence of right breast and nipple: Secondary | ICD-10-CM | POA: Insufficient documentation

## 2024-01-15 DIAGNOSIS — Z780 Asymptomatic menopausal state: Secondary | ICD-10-CM | POA: Insufficient documentation

## 2024-01-15 DIAGNOSIS — M810 Age-related osteoporosis without current pathological fracture: Secondary | ICD-10-CM | POA: Diagnosis not present

## 2024-01-15 DIAGNOSIS — Z1231 Encounter for screening mammogram for malignant neoplasm of breast: Secondary | ICD-10-CM | POA: Diagnosis not present

## 2024-01-15 DIAGNOSIS — Z853 Personal history of malignant neoplasm of breast: Secondary | ICD-10-CM | POA: Insufficient documentation

## 2024-01-15 DIAGNOSIS — M818 Other osteoporosis without current pathological fracture: Secondary | ICD-10-CM | POA: Diagnosis present

## 2024-01-15 DIAGNOSIS — Z13820 Encounter for screening for osteoporosis: Secondary | ICD-10-CM | POA: Insufficient documentation

## 2024-01-15 DIAGNOSIS — C50411 Malignant neoplasm of upper-outer quadrant of right female breast: Secondary | ICD-10-CM | POA: Diagnosis present

## 2024-01-16 ENCOUNTER — Other Ambulatory Visit: Payer: Self-pay | Admitting: Psychiatry

## 2024-01-16 ENCOUNTER — Telehealth: Payer: Self-pay

## 2024-01-16 ENCOUNTER — Ambulatory Visit: Payer: Self-pay | Admitting: Psychiatry

## 2024-01-16 MED ORDER — MIRTAZAPINE 7.5 MG PO TABS: 7.5000 mg | ORAL_TABLET | Freq: Every day | ORAL | 0 refills | Status: DC

## 2024-01-16 MED ORDER — MIRTAZAPINE 15 MG PO TABS: ORAL_TABLET | ORAL | 1 refills | Status: DC

## 2024-01-16 MED ORDER — MIRTAZAPINE 15 MG PO TBDP: ORAL_TABLET | ORAL | 1 refills | Status: DC

## 2024-01-16 NOTE — Telephone Encounter (Signed)
 ashley from [facility] called states that the mirtazapine  can not be cut in half will need to send in a 7.5mg    pt last seen on 4-29 next appt 6-17

## 2024-01-16 NOTE — Progress Notes (Signed)
 Please let the patient know that her EKG has improved, and her QTc is shorter now.  Advise her to reduce sertraline  by 50 mg each week until it is fully stopped. At the same time, she should start mirtazapine  at 7.5 mg nightly for one week, then increase to 15 mg nightly. (Order has been sent)

## 2024-01-16 NOTE — Telephone Encounter (Signed)
-----   Message from [place] sent at 01/16/2024  7:59 AM EDT ----- Please let the patient know that her EKG has improved, and her QTc is shorter now.  Advise her to reduce sertraline  by 50 mg each week until it is fully stopped. At the same time, she should start mirtazapine  at 7.5 mg nightly for one week, then increase to 15 mg nightly. (Order has been sent)

## 2024-01-16 NOTE — Telephone Encounter (Signed)
 pt has been notified and given instructions

## 2024-01-16 NOTE — Telephone Encounter (Addendum)
 I've noticed that the orally disintegrating tablet was ordered previously. Could you please contact the pharmacy to cancel that disintegrating tablet order, and 7.5 mg order? I've now sent in a prescription for the regular tablet, 15 mg instead.

## 2024-01-19 DIAGNOSIS — N3946 Mixed incontinence: Secondary | ICD-10-CM | POA: Insufficient documentation

## 2024-01-19 NOTE — Assessment & Plan Note (Signed)
 Sending orders for urinary incontinence supplies.  Patient will need briefs, might also benefit from wipes and underpads as well.

## 2024-01-20 ENCOUNTER — Other Ambulatory Visit: Payer: Self-pay | Admitting: Family

## 2024-01-20 NOTE — Telephone Encounter (Signed)
 pharmacy had already been notified.

## 2024-01-21 ENCOUNTER — Other Ambulatory Visit: Payer: Self-pay | Admitting: Oncology

## 2024-01-22 ENCOUNTER — Encounter: Admitting: Physical Therapy

## 2024-01-23 ENCOUNTER — Inpatient Hospital Stay: Payer: Medicaid Other | Admitting: Oncology

## 2024-01-24 ENCOUNTER — Inpatient Hospital Stay: Attending: Oncology | Admitting: Oncology

## 2024-01-24 ENCOUNTER — Encounter: Payer: Self-pay | Admitting: Oncology

## 2024-01-24 VITALS — BP 142/104 | HR 95 | Temp 98.2°F | Resp 16 | Ht 67.0 in | Wt 131.7 lb

## 2024-01-24 DIAGNOSIS — Z170 Estrogen receptor positive status [ER+]: Secondary | ICD-10-CM | POA: Diagnosis not present

## 2024-01-24 DIAGNOSIS — C50411 Malignant neoplasm of upper-outer quadrant of right female breast: Secondary | ICD-10-CM | POA: Diagnosis present

## 2024-01-24 DIAGNOSIS — Z86000 Personal history of in-situ neoplasm of breast: Secondary | ICD-10-CM | POA: Diagnosis not present

## 2024-01-24 DIAGNOSIS — M549 Dorsalgia, unspecified: Secondary | ICD-10-CM | POA: Diagnosis not present

## 2024-01-24 DIAGNOSIS — M542 Cervicalgia: Secondary | ICD-10-CM | POA: Insufficient documentation

## 2024-01-24 DIAGNOSIS — M810 Age-related osteoporosis without current pathological fracture: Secondary | ICD-10-CM | POA: Diagnosis not present

## 2024-01-24 DIAGNOSIS — Z79810 Long term (current) use of selective estrogen receptor modulators (SERMs): Secondary | ICD-10-CM | POA: Insufficient documentation

## 2024-01-24 NOTE — Progress Notes (Signed)
 Needs updated rx for mastectomy supplies from [facility]

## 2024-01-24 NOTE — Progress Notes (Signed)
 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  INTERVAL HISTORY: Patient returns to clinic today for routine 34-month evaluation.  She continues to have chronic back and neck pain and is actively being evaluated by neurosurgery.  She otherwise feels well.  She continues to tolerate tamoxifen  and Fosamax  without significant side effects.  She has no neurologic complaints.  She does not complain of weakness or fatigue today.  She has a good appetite.  She denies any recent fevers or illnesses. She has no chest pain, shortness of breath, cough, or hemoptysis.  She denies any nausea, vomiting, constipation, or diarrhea.  She has no urinary complaints.  Patient offers no further specific complaints today.  REVIEW OF SYSTEMS:   Review of Systems  Constitutional: Negative.  Negative for fever, malaise/fatigue and weight loss.  HENT:  Negative for sore throat.   Respiratory: Negative.  Negative for cough, hemoptysis and shortness of breath.   Cardiovascular: Negative.  Negative for chest pain, palpitations and leg swelling.  Gastrointestinal:  Negative for abdominal pain, diarrhea, nausea and vomiting.  Genitourinary: Negative.  Negative for dysuria.  Musculoskeletal:  Positive for back pain and neck pain. Negative for myalgias.  Skin: Negative.  Negative for rash.  Neurological: Negative.  Negative for dizziness, sensory change, weakness and headaches.  Psychiatric/Behavioral:  Positive for depression. The patient is not nervous/anxious and does not have insomnia.     As per HPI.  Otherwise, a complete review of systems is negative.  PAST MEDICAL HISTORY: Past Medical History:  Diagnosis Date   Anemia    during chemotherapy   Brain tumor (benign) (HCC)    Breast cancer (HCC) 1999   Status post right breast lumpectomy and radiation   Breast cancer (HCC) 11/2019   right breast   COPD (chronic obstructive pulmonary disease) (HCC)    Depression    History of chemotherapy 1999   History of seizure disorder    Hypertension    Personal history of chemotherapy    breast cancer 2021   Personal history of radiation therapy 1999   Pneumonia    PONV (postoperative nausea and vomiting)    vomiting    PAST SURGICAL HISTORY: Past Surgical History:  Procedure Laterality Date   BRAIN SURGERY     Brain tumor resection  1991   BREAST BIOPSY Right 1999   Positive   BREAST BIOPSY Right 11/10/2019   us  bx 10:00 heart marker, positive   BREAST BIOPSY Right 11/10/2019   us  bx axilla, hydromark 4, positive   BREAST BIOPSY Left 11/10/2019   us  bx 2:00, venus marker, neg   MASTECTOMY Right 07/13/2020   positive   PORTACATH PLACEMENT Left 11/30/2019   Procedure: INSERTION PORT-A-CATH;  Surgeon: Marshall Skeeter, MD;  Location: [facility] ORS;  Service: General;  Laterality: Left;   Right breast lumpectomy     SIMPLE MASTECTOMY WITH AXILLARY SENTINEL NODE BIOPSY Right 07/13/2020   Procedure: SIMPLE MASTECTOMY WITH AXILLARY SENTINEL NODE BIOPSY;  Surgeon: Marshall Skeeter, MD;  Location: [facility] ORS;  Service: General;  Laterality: Right;   TONSILLECTOMY     TUBAL LIGATION  FAMILY HISTORY: Family History  Problem Relation Age of Onset   Depression Mother    CAD Father    Hypertension Father    Lung cancer Father    Coronary artery disease Father 45   Schizophrenia Maternal Uncle    Breast cancer Maternal Grandmother    Bipolar disorder Daughter     ADVANCED DIRECTIVES (Y/N):  N  HEALTH MAINTENANCE: Social History   Tobacco Use   Smoking status: Every Day     Current packs/day: 0.25    Average packs/day: 0.3 packs/day for 40.0 years (10.0 ttl pk-yrs)    Types: Cigarettes   Smokeless tobacco: Never  Vaping Use   Vaping status: Never Used  Substance Use Topics   Alcohol use: No    Alcohol/week: 0.0 standard drinks of alcohol   Drug use: Not Currently    Frequency: 3.0 times per week    Types: Marijuana     Colonoscopy:  PAP:  Bone density:  Lipid panel:  Allergies  Allergen Reactions   Imitrex [Sumatriptan] Anaphylaxis   Penicillins Rash    Tolerated augmentin  in 2021   Amoxicillin      Current Outpatient Medications  Medication Sig Dispense Refill   albuterol  (VENTOLIN  HFA) 108 (90 Base) MCG/ACT inhaler Inhale 1-2 puffs into the lungs every 6 (six) hours as needed for wheezing or shortness of breath. 18 g 3   alendronate  (FOSAMAX ) 70 MG tablet TAKE ONE TABLET (70 MG TOTAL) BY MOUTH ONCE A WEEK. TAKE WITH A FULL GLASS OF WATER ON AN EMPTY STOMACH. 4 tablet 3   azelastine  (ASTELIN ) 0.1 % nasal spray Place 1 spray into both nostrils 2 (two) times daily. 30 mL 3   clonazePAM  (KLONOPIN ) 1 MG tablet TAKE ONE TABLET (1 MG TOTAL) BY MOUTH THREE TIMES DAILY AS NEEDED FOR ANXIETY. 90 tablet 2   gabapentin  (NEURONTIN ) 300 MG capsule TAKE ONE CAPSULE (300 MG TOTAL) BY MOUTH TWO TIMES DAILY. 90 capsule 1   hydrOXYzine  (ATARAX ) 25 MG tablet TAKE ONE TABLET (25 MG TOTAL) BY MOUTH THREE (THREE) TIMES DAILY. 90 tablet 3   methocarbamol  (ROBAXIN ) 500 MG tablet TAKE ONE TABLET (500 MG TOTAL) BY MOUTH EVERY SIX HOURS AS NEEDED FOR MUSCLE SPASMS. 120 tablet 3   sertraline  (ZOLOFT ) 100 MG tablet TAKE TWO TABLETS (200 MG TOTAL) BY MOUTH DAILY. 180 tablet 0   tamoxifen  (NOLVADEX ) 20 MG tablet TAKE ONE TABLET BY MOUTH ONCE A DAY 30 tablet 3   tiZANidine  (ZANAFLEX ) 4 MG tablet Take 1 tablet (4 mg total) by mouth at bedtime. 90 tablet 3   traMADol  (ULTRAM ) 50 MG tablet TAKE TWO TABLETS BY MOUTH TWICE DAILY AS NEEDED FOR SEVERE PAIN (NOTE DOSE INCREASE) 120  tablet 1   traZODone  (DESYREL ) 50 MG tablet TAKE ONE TO TWO TABLETS BY MOUTH EACH NIGHT AT BEDTIME AS NEEDED FOR INSOMNIA 180 tablet 3   varenicline  (CHANTIX ) 1 MG tablet TAKE ONE TABLET (1 MG TOTAL) BY MOUTH TWO TIMES DAILY. 60 tablet 2   zolpidem  (AMBIEN ) 10 MG tablet Take 1 tablet (10 mg total) by mouth at bedtime as needed for sleep. 90 tablet 1   mirtazapine  (REMERON ) 15 MG tablet 7.5 mg at night for one week, then 15 mg at night (Patient not taking: Reported on 01/24/2024) 30 tablet 1   No current facility-administered medications for this visit.    OBJECTIVE: Vitals:   01/24/24 1011  BP: (!) 142/104  Pulse: 95  Resp: 16  Temp: 98.2 F (36.8 C)  SpO2:  100%     Body mass index is 20.63 kg/m.    ECOG FS:0 - Asymptomatic  General: Well-developed, well-nourished, no acute distress. Eyes: Pink conjunctiva, anicteric sclera. HEENT: Normocephalic, moist mucous membranes. Lungs: No audible wheezing or coughing. Heart: Regular rate and rhythm. Abdomen: Soft, nontender, no obvious distention. Musculoskeletal: No edema, cyanosis, or clubbing. Neuro: Alert, answering all questions appropriately. Cranial nerves grossly intact. Skin: No rashes or petechiae noted. Psych: Normal affect.  LAB RESULTS:  Lab Results  Component Value Date   NA 138 11/28/2023   K 4.4 11/28/2023   CL 102 11/28/2023   CO2 23 11/28/2023   GLUCOSE 77 11/28/2023   BUN 15 11/28/2023   CREATININE 0.96 11/28/2023   CALCIUM 9.3 11/28/2023   PROT 6.3 11/28/2023   ALBUMIN 4.1 11/28/2023   AST 21 11/28/2023   ALT 11 11/28/2023   ALKPHOS 63 11/28/2023   BILITOT <0.2 11/28/2023   GFRNONAA >60 10/02/2023   GFRAA >60 04/20/2020    Lab Results  Component Value Date   WBC 7.6 10/02/2023   NEUTROABS 4.7 10/02/2023   HGB 10.9 (L) 10/02/2023   HCT 31.7 (L) 10/02/2023   MCV 88.5 10/02/2023   PLT 207 10/02/2023     STUDIES: MM 3D SCREENING MAMMOGRAM UNILATERAL LEFT BREAST Result Date: 01/17/2024 CLINICAL  DATA:  Screening. EXAM: DIGITAL SCREENING UNILATERAL LEFT MAMMOGRAM WITH CAD AND TOMOSYNTHESIS TECHNIQUE: Left screening digital craniocaudal and mediolateral oblique mammograms were obtained. Left screening digital breast tomosynthesis was performed. The images were evaluated with computer-aided detection. COMPARISON:  Previous exam(s). ACR Breast Density Category c: The breasts are heterogeneously dense, which may obscure small masses. FINDINGS: There are no findings suspicious for malignancy. IMPRESSION: No mammographic evidence of malignancy. A result letter of this screening mammogram will be mailed directly to the patient. RECOMMENDATION: Screening mammogram in one year. (Code:SM-B-01Y) BI-RADS CATEGORY  1: Negative. Electronically Signed   By: Amanda Jungling M.D.   On: 01/17/2024 13:01   DG Bone Density Result Date: 01/15/2024 EXAM: DUAL X-RAY ABSORPTIOMETRY (DXA) FOR BONE MINERAL DENSITY 01/15/2024 10:58 am CLINICAL DATA:  58 year old Female Postmenopausal. Screening for osteoporosis Patient is or has been on bone building therapies. TECHNIQUE: An axial (e.g., hips, spine) and/or appendicular (e.g., radius) exam was performed, as appropriate, using GE Secretary/administrator at [facility]. Images are obtained for bone mineral density measurement and are not obtained for diagnostic purposes. [id number] Exclusions: L4 due to degenerative changes. COMPARISON:  01/14/2023. FINDINGS: Scan quality: Good. LUMBAR SPINE (L1-L3): BMD (in g/cm2): 0.868 T-score: -2.6 Z-score: -1.4 Rate of change from previous exam: No significant rate of change from previous exam. LEFT FEMORAL NECK: BMD (in g/cm2): 0.704 T-score: -2.4 Z-score: -1.2 LEFT TOTAL HIP: BMD (in g/cm2): 0.665 T-score: -2.7 Z-score: -1.8 RIGHT FEMORAL NECK: BMD (in g/cm2): 0.660 T-score: -2.7 Z-score: -1.5 RIGHT TOTAL HIP: BMD (in g/cm2): 0.644 T-score: -2.9 Z-score: -2.0 DUAL-FEMUR TOTAL MEAN: Rate of change from previous exam: -7.5 %  LEFT FOREARM (RADIUS 33%): BMD (in g/cm2): 0.676 T-score: -2.3 Z-score: -1.4 FRAX 10-YEAR PROBABILITY OF FRACTURE: FRAX not reported as the lowest BMD is not in the osteopenia range. IMPRESSION: Osteoporosis based on BMD. Fracture risk is unknown due to history of bone building therapy. RECOMMENDATIONS: 1. All patients should optimize calcium and vitamin D  intake. 2. Consider FDA-approved medical therapies in postmenopausal women and men aged 79 years and older, based on the following: - A hip or vertebral (clinical or morphometric) fracture - T-score less than  or equal to -2.5 and secondary causes have been excluded. - Low bone mass (T-score between -1.0 and -2.5) and a 10-year probability of a hip fracture greater than or equal to 3% or a 10-year probability of a major osteoporosis-related fracture greater than or equal to 20% based on the [place] -adapted WHO algorithm. - Clinician judgment and/or patient preferences may indicate treatment for people with 10-year fracture probabilities above or below these levels 3. Patients with diagnosis of osteoporosis or at high risk for fracture should have regular bone mineral density tests. For patients eligible for Medicare, routine testing is allowed once every 2 years. The testing frequency can be increased to one year for patients who have rapidly progressing disease, those who are receiving or discontinuing medical therapy to restore bone mass, or have additional risk factors. Electronically Signed   By: Dina  Arceo M.D.   On: 01/15/2024 12:54    ONCOLOGY HISTORY: Patient was initially stage IIIa prior to neoadjuvant chemotherapy. She completed 4 cycles of Adriamycin  and Cytoxan .  She only received 10 infusions of weekly Taxol  her last one occurring on April 06, 2020.  Given her persistently decreased performance status and worsening neuropathy, treatment was discontinued. She underwent simple mastectomy on July 13, 2020 with significant improvement of her disease  burden.  She did not require XRT given her simple mastectomy.   Her most recent CT scan on December 05, 2020 did not reveal any evidence of recurrent or progressive disease.   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  Letrozole  was discontinued secondary to osteoporosis and patient was initiated on tamoxifen  which she will require to take for a minimum of 5 years completing treatment in December 2026. Given her high risk disease, could possibly extend treatment for 7 to 10 years.  Patient's most recent left breast screening mammogram on Jan 15, 2024 was reported as BI-RADS 1.  Repeat in May 2026.  Return to clinic in 6 months for routine evaluation.   History of DCIS: Status post lumpectomy and XRT.  Patient reports she did not take any adjuvant hormonal therapy.  Given this history, previously referred patient to genetic counseling for evaluation and testing. Osteoporosis: Patient's most recent bone mineral density on Jan 15, 2024 revealed persistent osteoporosis.  Continue Fosamax , calcium, and vitamin D  supplement.  Repeat in May 2026 along with mammogram as above.   Back pain/neck pain: Continue follow-up with neurosurgery as scheduled.   Patient expressed understanding and was in agreement with this plan. She also understands that She can call clinic at any time with any questions, concerns, or complaints.    Cancer Staging  Primary cancer of upper outer quadrant of right female breast [facility]) Staging form: Breast, AJCC 8th Edition - Clinical stage from 11/26/2019: Stage IIIA (cT2, cN2a, cM0, G3, ER+, PR+, HER2-) - Signed by Shellie Dials, MD on 11/26/2019 Stage prefix: Initial diagnosis Histologic grading system: 3 grade system   Shellie Dials, MD    01/24/2024 12:23 PM

## 2024-01-29 ENCOUNTER — Encounter

## 2024-02-05 ENCOUNTER — Encounter: Admitting: Physical Therapy

## 2024-02-12 ENCOUNTER — Encounter: Admitting: Physical Therapy

## 2024-02-13 ENCOUNTER — Other Ambulatory Visit: Payer: Self-pay | Admitting: Family

## 2024-02-15 NOTE — Progress Notes (Signed)
 No show

## 2024-02-17 ENCOUNTER — Other Ambulatory Visit: Payer: Self-pay | Admitting: Family

## 2024-02-18 ENCOUNTER — Ambulatory Visit (INDEPENDENT_AMBULATORY_CARE_PROVIDER_SITE_OTHER): Admitting: Psychiatry

## 2024-02-18 DIAGNOSIS — Z91199 Patient's noncompliance with other medical treatment and regimen due to unspecified reason: Secondary | ICD-10-CM

## 2024-02-19 ENCOUNTER — Encounter: Admitting: Physical Therapy

## 2024-02-26 ENCOUNTER — Encounter: Admitting: Physical Therapy

## 2024-02-28 ENCOUNTER — Ambulatory Visit: Admitting: Family

## 2024-02-29 ENCOUNTER — Other Ambulatory Visit: Payer: Self-pay | Admitting: Family

## 2024-03-04 ENCOUNTER — Encounter: Admitting: Physical Therapy

## 2024-03-04 ENCOUNTER — Encounter: Payer: Self-pay | Admitting: Family

## 2024-03-04 ENCOUNTER — Telehealth: Payer: Self-pay | Admitting: Family

## 2024-03-04 ENCOUNTER — Ambulatory Visit: Payer: MEDICAID | Admitting: Family

## 2024-03-04 VITALS — BP 102/72 | HR 105 | Ht 67.0 in | Wt 136.4 lb

## 2024-03-04 DIAGNOSIS — E559 Vitamin D deficiency, unspecified: Secondary | ICD-10-CM

## 2024-03-04 DIAGNOSIS — F411 Generalized anxiety disorder: Secondary | ICD-10-CM | POA: Diagnosis not present

## 2024-03-04 DIAGNOSIS — I10 Essential (primary) hypertension: Secondary | ICD-10-CM | POA: Diagnosis not present

## 2024-03-04 DIAGNOSIS — F332 Major depressive disorder, recurrent severe without psychotic features: Secondary | ICD-10-CM | POA: Diagnosis not present

## 2024-03-04 DIAGNOSIS — M545 Low back pain, unspecified: Secondary | ICD-10-CM

## 2024-03-04 DIAGNOSIS — M5030 Other cervical disc degeneration, unspecified cervical region: Secondary | ICD-10-CM

## 2024-03-04 DIAGNOSIS — E782 Mixed hyperlipidemia: Secondary | ICD-10-CM

## 2024-03-04 DIAGNOSIS — G43E19 Chronic migraine with aura, intractable, without status migrainosus: Secondary | ICD-10-CM

## 2024-03-04 DIAGNOSIS — E538 Deficiency of other specified B group vitamins: Secondary | ICD-10-CM | POA: Diagnosis not present

## 2024-03-04 DIAGNOSIS — R5383 Other fatigue: Secondary | ICD-10-CM

## 2024-03-04 MED ORDER — TRAMADOL HCL 50 MG PO TABS: ORAL_TABLET | ORAL | 1 refills | Status: DC

## 2024-03-04 MED ORDER — TIZANIDINE HCL 4 MG PO TABS: 4.0000 mg | ORAL_TABLET | Freq: Every day | ORAL | 3 refills | Status: DC

## 2024-03-04 MED ORDER — TRIAMCINOLONE ACETONIDE 0.1 % EX CREA: 1.0000 | TOPICAL_CREAM | Freq: Two times a day (BID) | CUTANEOUS | 0 refills | Status: DC

## 2024-03-04 MED ORDER — CLONAZEPAM 1 MG PO TABS: 1.0000 mg | ORAL_TABLET | Freq: Three times a day (TID) | ORAL | 2 refills | Status: DC | PRN

## 2024-03-04 MED ORDER — VARENICLINE TARTRATE 1 MG PO TABS: 1.0000 mg | ORAL_TABLET | Freq: Two times a day (BID) | ORAL | 2 refills | Status: DC

## 2024-03-04 NOTE — Telephone Encounter (Signed)
 Called patient to see if she still has [facility] Medicaid because I am getting a member not found message when I try to do a PA for her eszopiclone . VM was full and I was unable to leave a message.

## 2024-03-05 NOTE — Telephone Encounter (Signed)
 Disregard the  see other message

## 2024-03-05 NOTE — Telephone Encounter (Signed)
See other message as well

## 2024-03-11 ENCOUNTER — Encounter: Admitting: Physical Therapy

## 2024-03-13 ENCOUNTER — Other Ambulatory Visit: Payer: Self-pay | Admitting: Family

## 2024-03-18 ENCOUNTER — Encounter: Admitting: Physical Therapy

## 2024-03-18 NOTE — Progress Notes (Signed)
 Today the history is gathered from: 100% - patient  0% - alone  RECORDS SUMMARY: Patient referred by Dr. Clois for tremor.  REFERRING PHYSICIAN: Pcp PRIMARY CARE PHYSICIAN:  [facility]   IMPRESSION/PLAN  [name] is a 58 y.o. female presenting for evaluation of  TREMOR/ NECK PAIN/ BACK PAIN/ IMBALANCE/ MEMORY CONCERNS/ HX OF BRAIN TUMOR/ HEARING DIFFICULTY - New to me. - Patient with Hx of brain tumor, traumatic seizures, and PMH of HTN, breast cancer, and COPD is here to be evaluated for frequent falls, syncopal episodes, seizure like activity, dizziness, and tremors. Most recent seizure like episode occurred in August 2024. Taking Clonazepam  1 mg TID, Gabapentin  300 mg BID, Hydroxyzine  25 mg TID PRN, Zoloft  200 mg daily, Tizanidine  4 mg QHS, Tramadol  100 mg BID, and Trazodone  50-100 mg QHS.  - Start taking Seroquel 25 mg, 1-3 pills at night for difficulty sleeping and mood concerns. - Start taking Prozac 10 mg daily for depression.  - Start taking Diclofenac 75 mg twice daily for 1 month, then decrease to 75 mg once daily for 1 month, then stop taking. Medication is for neck, back, and shoulder pain. Always take this medication with food.  - Stop taking Trazodone  50-100 mg nightly for difficulty sleeping.  - Stop taking OTC Motrin  for neck, back, and shoulder pain. - Stop taking Tramadol  100 mg twice daily as it can increase the risk for seizures. - Order Urgent Routine EEG to evaluate seizure like activity. - Order Urgent 3 Hour EEG to evaluate seizure like activity. - Recommend contacting [facility] to release records of recent cervical spine MRI, lumbar spine MRI, and shoulder Xray.  - Recommend following with upcoming Psychiatry appointment to manage mental health concerns. - Recommend continuing to follow with Neurosurgery to manage neck and back pain. - Will plan to address tremors at next visit.  - Could consider starting an antiepileptic medication at  future visit, pending results of upcoming EEGs.  Over 60 minutes of physician time has been spent before, during and after this visit which includes time for precharting and preparing to see the patient, family and/or caregiver, documenting clinical information in the electronic or other health record, independently interpreting results and communicating results to the patient, family and/or caregiver, getting and/or reviewing separately obtained history, ordering medications, tests or procedures, performing a medically appropriate exam and/or evaluation, care coordination, counseling and educating the patient, and referring the patient to and communicating with other health care professionals as needed.    Medications previously tried: Imitrex (anaphylaxis) Gabapentin  (prescribed for seizures and then she stopped taking it after she stopped having seizures) Remeron  (panic attack)  Follow-up with Dr. Lane in 2 months.  p=5   CHIEF COMPLAINT & HPI  [name] is a 58 y.o. female presenting for evaluation of: Chief Complaint  Patient presents with  . TREMOR/ NECK PAIN/ BACK PAIN/ IMBALANCE/   . MEMORY CONCERNS/ HX OF BRAIN TUMOR    HX OF SEIZURES/ HX OF BRAIN TUMOR/ HEARING DIFFICULTY/ DIZZINESS/ SYNCOPE/TREMOR/ NECK PAIN/ BACK PAIN/ IMBALANCE/ MEMORY CONCERNS/ RLS/ Patient with Hx of brain tumor, traumatic seizures, and PMH of HTN, breast cancer, and COPD is here to be evaluated for frequent falls, syncopal episodes, seizure like activity, dizziness, and tremors. Per Neurosurgery report, patient has had neck and lumbar back pain for many years. Bursitis in the left shoulder.  She has additionally had imbalance, tremors, and difficulty with R hand coordination for the last few months. Patient has had multiple recent  falls due to abrupt leg weakness, her knees giving out, and/or dizziness. She did bump her head at most recent fall. Tremors have been occurring since 2024 in bilateral hands and  occasionally about the entire body, often triggered by stress or anxiety. Reports occasional numbness in bilateral hands. No tingling, pain, or weakness. Dizziness occurs sporadically, sometimes triggered by positional changes and sometimes not. Patient has had multiple recent syncopal episodes. She also reports she was diagnosed with seizures in the 1990s following brain tumor removal. Previous seizure episodes occurred with shaking and LOC. No tongue biting or incontinence. Most recent seizure like episode occurred around August 2024 however she cannot remember exact details of the event. Notes she woke up on the ground and had loss of memory of the time preceding this event. She reports she was previously on Gabapentin  for seizures. Patient states she had MDD, PTSD, and anxiety. Takes Trazodone  for sleep. Taking Clonazepam  1 mg TID, Gabapentin  300 mg BID, Hydroxyzine  25 mg TID PRN, Zoloft  200 mg daily, Tizanidine  4 mg QHS, Tramadol  100 mg BID, and Trazodone  50-100 mg QHS.   DATA SUMMARY: 09/28/2023 CT TSPINE W CONTRAST IMPRESSION:  1. Ill-defined edema in the right posterior paraspinal soft tissues  at the T10-T11 level, measuring up to approximately 1.9 x 4.5 by 4.0  cm and compatible with cellulitis/early phlegmonous change given the  provided clinical history. No discrete, drainable fluid collection  at this time. This edema involves the subcutaneous fat superficial  to the paraspinal musculature and does not appear to extend to the  canal.  2. No specific evidence of discitis/osteomyelitis by CT. An MRI of  the thoracic spine with contrast could provide more sensitive  evaluation.  3. Aortic Atherosclerosis (ICD10-I70.0) and Emphysema (ICD10-J43.9   12/07/2022 DG LSPINE COMPLETE IMPRESSION:  Moderate degenerative disc disease at L5-S1. No acute abnormality  seen.   12/07/2022 DG CSPINE COMPLETE IMPRESSION:  Moderate multilevel degenerative disc disease. No acute abnormality  seen.    VISIT SUMMARIES:   MEDICATIONS Current Outpatient Medications  Medication Sig Dispense Refill  . acetaminophen  (TYLENOL ) 500 MG tablet Take by mouth every 8 (eight) hours as needed       . albuterol  90 mcg/actuation inhaler Inhale into the lungs every 6 (six) hours as needed       . alendronate  (FOSAMAX ) 70 MG tablet Take by mouth every 7 (seven) days       . clonazePAM  (KLONOPIN ) 1 MG tablet Take 1 mg by mouth 3 (three) times daily    . diphenoxylate -atropine  (LOMOTIL ) 2.5-0.025 mg tablet TAKE 1 TABLET BY MOUTH 4 TIMES DAILY AS NEEDED FOR DIARRHEA OR LOOSE STOOLS    . lidocaine -prilocaine  (EMLA ) cream Apply to affected area once    . losartan  (COZAAR ) 50 MG tablet Take 50 mg by mouth once daily    . ondansetron  (ZOFRAN ) 8 MG tablet TAKE 1 TABLET BY MOUTH 2 TIMES DAILY AS NEEDED    . potassium chloride  (KLOR-CON ) 20 MEQ ER tablet Take by mouth 2 (two) times daily       . prochlorperazine  (COMPAZINE ) 10 MG tablet TAKE 1 TABLET BY MOUTH EVERY 6 HOURS AS NEEDED (NAUSEA OR VOMITING)    . sennosides (SENOKOT) 8.6 mg tablet Take 1 tablet by mouth once daily    . tamoxifen  (NOLVADEX ) 20 MG tablet Take 20 mg by mouth once daily    . tetrahydrozoline (VISINE) 0.05 % ophthalmic solution Apply to eye as directed       . traMADoL  (ULTRAM )  50 mg tablet Take 1 tablet by mouth 2 (two) times daily    . vilazodone (VIIBRYD) 20 mg tablet Take 20 mg by mouth once daily     No current facility-administered medications for this visit.    ALLERGIES Allergies  Allergen Reactions  . Imitrex [Sumatriptan] Anaphylaxis  . Amoxicillin  Unknown  . Penicillin Rash     EXAM  There were no vitals filed for this visit. There is no height or weight on file to calculate BMI.  GENERAL: Pleasant female.  NAD.  Normocephalic and atraumatic.  MUSCULOSKELETAL: Bulk - Normal Tone - Normal Pronator Drift - Absent bilaterally. Ambulation - Gait and station are not tested. Romberg - not tested  R/L 5/5     Shoulder abduction (deltoid/supraspinatus, axillary/suprascapular n, C5) 5/5    Elbow flexion (biceps brachii, musculoskeletal n, C5-6) 5/5    Elbow extension (triceps, radial n, C7) 5/5    Finger adduction (interossei, ulnar n, T1)  5/5    Hip flexion (iliopsoas, L1/L2) 5/5    Knee flexion (hamstrings, sciatic n, L5/S1)  5/5    Knee extension (quadriceps, femoral n, L3/4) 5/5    Ankle dorsiflexion (tibialis anterior, deep fibular n, L4/5) 5/5    Ankle plantarflexion (gastroc, tibial n, S1)   NEUROLOGICAL: MENTAL STATUS: Patient is oriented to person, place and time.  Recent memory is intact.  Remote memory is intact.  Attention span and concentration are intact.  Naming, repetition, comprehension and expressive speech are within normal limits.  Patient's fund of knowledge is within normal limits for educational level.  CRANIAL NERVES: Normal    CN II (normal visual acuity and visual fields) Normal    CN III, IV, VI (extraocular muscles are intact) Normal    CN V (facial sensation is intact bilaterally) Normal    CN VII (facial strength is intact bilaterally) Normal    CN VIII (hearing is intact bilaterally) Normal    CN IX/X (palate elevates midline, normal phonation) Normal    CN XI (shoulder shrug strength is normal and symmetric) Normal    CN XII (tongue protrudes midline)  SENSATION: Intact to pain and temp bilaterally (spinothalamic tracts) Intact to position and vibration bilaterally (dorsal columns)  REFLEXES: R/L 2+/2+    Biceps 2+/2+    Brachioradialis  2+/2+    Patellar 2+/2+    Achilles  COORDINATION/CEREBELLAR: Finger to nose testing is within normal limits.     PAST MEDICAL HISTORY Past Medical History:  Diagnosis Date  . Anemia    during chemotherapy  . Brain tumor (benign) (CMS/HHS-HCC)   . Breast cancer (CMS/HHS-HCC) 2021   right breast  . COPD (chronic obstructive pulmonary disease) (CMS/HHS-HCC)   . Depression   . Hard of hearing   . History of  chemotherapy 1999  . History of radiation therapy 1999  . History of seizure disorder   . Hypertension   . Malignant neoplasm of right breast (CMS/HHS-HCC) 07/13/2020   Right breast, post neoadjuvant chemotherapy, T2, N0, ER/PR positive, HER-2/neu not overexpressed.  SABRA PONV (postoperative nausea and vomiting)   . Seizure disorder (CMS/HHS-HCC)     PAST SURGICAL HISTORY Past Surgical History:  Procedure Laterality Date  . CRANIOTOMY FOR BRAIN TUMOR RESECTION  1991  . LAPAROSCOPIC TUBAL LIGATION  1994  . MASTECTOMY PARTIAL / LUMPECTOMY Right 1999   at [facility]  . BREAST EXCISIONAL BIOPSY Right 1999  . INCISIONAL BIOPSY BREAST Right 11/10/2019  . BREAST EXCISIONAL BIOPSY Right 11/10/2019  . PORTACATH PLACEMENT Left 11/30/2019  .  MASTECTOMY SIMPLE Right 07/13/2020  . LAPAROSCOPIC TUBAL LIGATION    . TONSILLECTOMY      FAMILY HISTORY Family History  Problem Relation Name Age of Onset  . Depression Mother    . Lung cancer Father    . High blood pressure (Hypertension) Father    . Coronary Artery Disease (Blocked arteries around heart) Father    . Breast cancer Maternal Grandmother    . Ovarian cancer Paternal Aunt    . Bone cancer Maternal Grandfather    . Colon cancer Neg Hx      SOCIAL HISTORY  Social History   Tobacco Use  . Smoking status: Every Day    Current packs/day: 1.00    Average packs/day: 1 pack/day for 40.0 years (40.0 ttl pk-yrs)    Types: Cigarettes  . Smokeless tobacco: Never  Substance Use Topics  . Alcohol use: Never  . Drug use: Yes    Types: Marijuana    Comment: occasionally      REVIEW OF SYSTEMS:  13 system ROS form was given to the patient to complete and I have reviewed it.  The form was sent for scan to the patient's EHR.  Pertinent positives and negatives are mentioned above in the HPI and all other systems are negative.   DATA  I have personally reviewed all of the data outlined below both prior to the appointment and during the  appointment with the patient as appropriate.  No visits with results within 6 Month(s) from this visit.  Latest known visit with results is:  No results found for any previous visit.      No follow-ups on file.  Payor: [employer] / Plan: [place] [facility] TAILORED CARE / Product Type: Medicaid /   This note is partially written by [employer], in the presence of and acting as the scribe of Dr. Arthea Farrow.   I have reviewed, edited and added to the note as needed to reflect my best personal medical judgment.    Dr. Arthea Farrow, MD [facility] [place], [place] Ph:  [phone] Fax:  [phone]
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
## 2024-03-23 ENCOUNTER — Ambulatory Visit: Payer: MEDICAID | Admitting: Psychiatry

## 2024-03-24 ENCOUNTER — Encounter: Payer: MEDICAID | Admitting: Psychiatry

## 2024-03-24 ENCOUNTER — Encounter: Payer: Self-pay | Admitting: Psychiatry

## 2024-03-24 NOTE — Progress Notes (Unsigned)
 This encounter was created in error - please disregard.

## 2024-03-25 ENCOUNTER — Encounter: Admitting: Physical Therapy

## 2024-03-26 ENCOUNTER — Ambulatory Visit: Payer: MEDICAID | Admitting: Neurosurgery

## 2024-03-26 ENCOUNTER — Ambulatory Visit: Payer: MEDICAID | Admitting: Psychiatry

## 2024-03-31 ENCOUNTER — Ambulatory Visit: Payer: MEDICAID | Admitting: Neurosurgery

## 2024-04-01 ENCOUNTER — Encounter: Admitting: Physical Therapy

## 2024-04-08 ENCOUNTER — Encounter: Admitting: Physical Therapy

## 2024-04-15 ENCOUNTER — Other Ambulatory Visit: Payer: Self-pay

## 2024-04-15 ENCOUNTER — Encounter: Admitting: Physical Therapy

## 2024-04-16 ENCOUNTER — Telehealth: Payer: Self-pay | Admitting: Family

## 2024-04-16 MED ORDER — SERTRALINE HCL 100 MG PO TABS: 200.0000 mg | ORAL_TABLET | Freq: Every day | ORAL | 0 refills | Status: DC

## 2024-04-16 NOTE — Telephone Encounter (Signed)
 Left VM to see if patient should be taking sertraline , fluoxetine, and Seroquel all 3. States sertraline  and fluoxetine are the same class so wanted to make sure she should be taking both? Please advise.

## 2024-04-20 NOTE — Telephone Encounter (Signed)
 Patient left VM stating she needs her sertraline  filled and she is not taking the Seroquel. Called [facility] and advised them to proceed with filling her sertraline .

## 2024-04-21 ENCOUNTER — Ambulatory Visit: Payer: MEDICAID | Admitting: Neurosurgery

## 2024-04-21 VITALS — BP 118/84 | Ht 67.0 in | Wt 139.0 lb

## 2024-04-21 DIAGNOSIS — M4802 Spinal stenosis, cervical region: Secondary | ICD-10-CM | POA: Diagnosis not present

## 2024-04-21 DIAGNOSIS — M5412 Radiculopathy, cervical region: Secondary | ICD-10-CM

## 2024-04-21 DIAGNOSIS — G8929 Other chronic pain: Secondary | ICD-10-CM

## 2024-04-21 NOTE — Progress Notes (Signed)
 Referring Physician:  Orlean Alan HERO, FNP [address] [place],  [place] [address]  Primary Physician:  Orlean Alan HERO, FNP  History of Present Illness: 04/21/2024 Ms. Rumler returns to see me.  She has had trouble pursuing physical therapy due to difficulty with transportation.  She has been evaluated by neurology.  12/19/2023 Ms. Alethia Melendrez is here today with a chief complaint of neck and lower back pain.  Additionally, she has had issues with her balance as well as with tremors.  Most of this has been occurring over the past several months.  She also reported some issues with memory loss.  She has had some issues with coordination of her right hand.  She is dropped some items.  She has had neck pain for years, though it has been worsening recently.  Movement makes her pain worse.  Laying down makes it better.  Bowel/Bladder Dysfunction: none  Conservative measures:  Physical therapy:  has not participated in PT Multimodal medical therapy including regular antiinflammatories: tramadol , Gabapentin , Methocarbamol  Injections: no epidural steroid injections  Past Surgery: none  BAYLER NEHRING has mild symptoms of cervical myelopathy.  The symptoms are causing a significant impact on the patient's life.   I have utilized the care everywhere function in epic to review the outside records available from external health systems.  Review of Systems:  A 10 point review of systems is negative, except for the pertinent positives and negatives detailed in the HPI.  Past Medical History: Past Medical History:  Diagnosis Date   Anemia    during chemotherapy   Brain tumor (benign) (HCC)    Breast cancer (HCC) 1999   Status post right breast lumpectomy and radiation   Breast cancer (HCC) 11/2019   right breast   COPD (chronic obstructive pulmonary disease) (HCC)    Depression    History of chemotherapy 1999   History of seizure disorder    Hypertension    Personal  history of chemotherapy    breast cancer 2021   Personal history of radiation therapy 1999   Pneumonia    PONV (postoperative nausea and vomiting)    vomiting    Past Surgical History: Past Surgical History:  Procedure Laterality Date   BRAIN SURGERY     Brain tumor resection  1991   BREAST BIOPSY Right 1999   Positive   BREAST BIOPSY Right 11/10/2019   us  bx 10:00 heart marker, positive   BREAST BIOPSY Right 11/10/2019   us  bx axilla, hydromark 4, positive   BREAST BIOPSY Left 11/10/2019   us  bx 2:00, venus marker, neg   MASTECTOMY Right 07/13/2020   positive   PORTACATH PLACEMENT Left 11/30/2019   Procedure: INSERTION PORT-A-CATH;  Surgeon: Dessa Reyes ORN, MD;  Location: [facility] ORS;  Service: General;  Laterality: Left;   Right breast lumpectomy     SIMPLE MASTECTOMY WITH AXILLARY SENTINEL NODE BIOPSY Right 07/13/2020   Procedure: SIMPLE MASTECTOMY WITH AXILLARY SENTINEL NODE BIOPSY;  Surgeon: Dessa Reyes ORN, MD;  Location: [facility] ORS;  Service: General;  Laterality: Right;   TONSILLECTOMY     TUBAL LIGATION      Allergies: Allergies as of 04/21/2024 - Review Complete 04/21/2024  Allergen Reaction Noted   Imitrex [sumatriptan] Anaphylaxis 04/17/2018   Penicillins Rash 07/04/2015   Amoxicillin   10/16/2023    Medications:  Current Outpatient Medications:    albuterol  (VENTOLIN  HFA) 108 (90 Base) MCG/ACT inhaler, Inhale 1-2 puffs into the lungs every 6 (six) hours as needed  for wheezing or shortness of breath., Disp: 18 g, Rfl: 3   alendronate  (FOSAMAX ) 70 MG tablet, TAKE ONE TABLET (70 MG TOTAL) BY MOUTH ONCE A WEEK. TAKE WITH A FULL GLASS OF WATER ON AN EMPTY STOMACH., Disp: 4 tablet, Rfl: 3   azelastine  (ASTELIN ) 0.1 % nasal spray, Place 1 spray into both nostrils 2 (two) times daily., Disp: 30 mL, Rfl: 3   clonazePAM  (KLONOPIN ) 1 MG tablet, Take 1 tablet (1 mg total) by mouth 3 (three) times daily as needed for anxiety., Disp: 90 tablet, Rfl: 2   diazepam (VALIUM)  5 MG tablet, , Disp: , Rfl:    diclofenac (VOLTAREN) 75 MG EC tablet, Take 75 mg by mouth 2 (two) times daily., Disp: , Rfl:    Eszopiclone  3 MG TABS, TAKE ONE TABLET (3 MG TOTAL) BY MOUTH AT BEDTIME. TAKE IMMEDIATELY BEFORE BEDTIME, Disp: 30 tablet, Rfl: 3   FLUoxetine (PROZAC) 10 MG capsule, Take 10 mg by mouth., Disp: , Rfl:    gabapentin  (NEURONTIN ) 300 MG capsule, TAKE ONE CAPSULE (300 MG TOTAL) BY MOUTH TWO TIMES DAILY., Disp: 90 capsule, Rfl: 1   hydrOXYzine  (ATARAX ) 25 MG tablet, TAKE ONE TABLET (25 MG TOTAL) BY MOUTH THREE (THREE) TIMES DAILY., Disp: 90 tablet, Rfl: 3   LORazepam  (ATIVAN ) 1 MG tablet, , Disp: , Rfl:    losartan  (COZAAR ) 50 MG tablet, TAKE ONE TABLET (50 MG TOTAL) BY MOUTH DAILY., Disp: 90 tablet, Rfl: 1   methocarbamol  (ROBAXIN ) 500 MG tablet, TAKE ONE TABLET (500 MG TOTAL) BY MOUTH EVERY SIX HOURS AS NEEDED FOR MUSCLE SPASMS., Disp: 120 tablet, Rfl: 3   mirtazapine  (REMERON  SOL-TAB) 15 MG disintegrating tablet, , Disp: , Rfl:    mirtazapine  (REMERON ) 7.5 MG tablet, , Disp: , Rfl:    QUEtiapine (SEROQUEL) 25 MG tablet, Take 1-3 tabs nightly for sleep, Disp: , Rfl:    sertraline  (ZOLOFT ) 100 MG tablet, Take 2 tablets (200 mg total) by mouth daily., Disp: 180 tablet, Rfl: 0   tamoxifen  (NOLVADEX ) 20 MG tablet, TAKE ONE TABLET BY MOUTH ONCE A DAY, Disp: 30 tablet, Rfl: 3   tiZANidine  (ZANAFLEX ) 4 MG tablet, Take 1 tablet (4 mg total) by mouth at bedtime., Disp: 90 tablet, Rfl: 3   traMADol  (ULTRAM ) 50 MG tablet, TAKE TWO TABLETS BY MOUTH TWICE DAILY AS NEEDED FOR SEVERE PAIN (NOTE DOSE INCREASE), Disp: 120 tablet, Rfl: 1   traZODone  (DESYREL ) 50 MG tablet, TAKE ONE TO TWO TABLETS BY MOUTH EACH NIGHT AT BEDTIME AS NEEDED FOR INSOMNIA, Disp: 180 tablet, Rfl: 3   triamcinolone  cream (KENALOG ) 0.1 %, Apply 1 Application topically 2 (two) times daily., Disp: 453 g, Rfl: 0   varenicline  (CHANTIX ) 1 MG tablet, Take 1 tablet (1 mg total) by mouth 2 (two) times daily., Disp: 56  tablet, Rfl: 2   zolpidem  (AMBIEN ) 10 MG tablet, Take 1 tablet (10 mg total) by mouth at bedtime as needed for sleep., Disp: 90 tablet, Rfl: 1  Social History: Social History   Tobacco Use   Smoking status: Every Day    Current packs/day: 0.25    Average packs/day: 0.3 packs/day for 40.0 years (10.0 ttl pk-yrs)    Types: Cigarettes   Smokeless tobacco: Never  Vaping Use   Vaping status: Never Used  Substance Use Topics   Alcohol use: No    Alcohol/week: 0.0 standard drinks of alcohol   Drug use: Not Currently    Frequency: 3.0 times per week    Types: Marijuana  Family Medical History: Family History  Problem Relation Age of Onset   Depression Mother    CAD Father    Hypertension Father    Lung cancer Father    Coronary artery disease Father 25   Schizophrenia Maternal Uncle    Breast cancer Maternal Grandmother    Bipolar disorder Daughter     Physical Examination: Vitals:   04/21/24 1132  BP: 118/84    General: Patient is in no apparent distress. Attention to examination is appropriate.  Neck:   Supple.  Full range of motion with discomfort.  Respiratory: Patient is breathing without any difficulty.   NEUROLOGICAL:     Awake, alert, oriented to person, place, and time.  Speech is clear and fluent.   Cranial Nerves: Pupils equal round and reactive to light.  Facial tone is symmetric.  Facial sensation is symmetric. Shoulder shrug is symmetric. Tongue protrusion is midline.    5/5 t/u except R deltoid 4+ (shoulder pain)  Reflexes are 1+ and symmetric at the biceps, triceps, brachioradialis, patella and achilles.   Hoffman's is absent.   Bilateral upper and lower extremity sensation is intact to light touch.    No evidence of dysmetria noted.  Gait is normal.     Medical Decision Making  Imaging: MRI C spine 11/13/2023 Moderate stenosis at C3-4 from retrolisthesis and disc herniation.  Foraminal narrowing at multiple cervical levels most prominent  at C5-6 and C6-7.  1.3 cm cystic lesion at the right anterior C4 vertebral body margin with sharp margins and smooth bone remodeling.  MRI lumbar spine on October 18, 2023 shows L5-S1 bulging disc and facet arthropathy with moderate left and moderate right foraminal stenosis.  L4-5 7 mm bulging disc effacing the thecal sac and barely abutting the descending L5 nerve roots bilaterally.  MRI thoracic spine on October 18, 2023 shows no evidence of compressive thoracic disc disease or spinal canal or foraminal stenosis.   I have personally reviewed the images and agree with the above interpretation.  Assessment and Plan: Ms. Tapper is a pleasant 58 y.o. female with memory loss, essential tremor, and possible cervical myelopathy or radiculopathy.  She has moderate stenosis at C3-4.  She also has foraminal stenosis C5-6 and C6-7.  I will await final neurology determination before considering any surgical intervention for her neck.  I will rerefer her to physical therapy for her neck, arm, and right shoulder.  I will see her back in approximately 2-1/2 months.  I spent a total of 10 minutes in this patient's care today. This time was spent reviewing pertinent records including imaging studies, obtaining and confirming history, performing a directed evaluation, formulating and discussing my recommendations, and documenting the visit within the medical record.        Thank you for involving me in the care of this patient.      Joclyn Alsobrook K. Clois MD, [facility] Neurosurgery

## 2024-04-22 ENCOUNTER — Encounter: Admitting: Physical Therapy

## 2024-05-01 ENCOUNTER — Other Ambulatory Visit: Payer: Self-pay | Admitting: Family
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
## 2024-05-07 ENCOUNTER — Other Ambulatory Visit: Payer: Self-pay | Admitting: Oncology

## 2024-05-07 ENCOUNTER — Ambulatory Visit: Payer: MEDICAID | Admitting: Psychiatry

## 2024-05-11 ENCOUNTER — Other Ambulatory Visit: Payer: Self-pay | Admitting: Family

## 2024-05-12 ENCOUNTER — Ambulatory Visit: Payer: MEDICAID | Admitting: Physical Therapy

## 2024-05-19 ENCOUNTER — Ambulatory Visit: Payer: MEDICAID | Admitting: Physical Therapy

## 2024-05-19 ENCOUNTER — Other Ambulatory Visit: Payer: Self-pay | Admitting: Family

## 2024-05-20 NOTE — Progress Notes (Signed)
 Today the history is gathered from: 100% - patient  0% - alone  GOES BY ANGIE  RECORDS SUMMARY: Patient referred by Dr. Clois for tremor.  REFERRING PHYSICIAN: Pcp PRIMARY CARE PHYSICIAN:  [facility]  IMPRESSION/PLAN  Ms. Leverett is a 58 y.o. female presenting for evaluation of  TREMOR/ NECK PAIN/ BACK PAIN/ IMBALANCE/ MEMORY CONCERNS/ HX OF BRAIN TUMOR/ HEARING DIFFICULTY - Ongoing.  - Patient with no recent syncope spells or seizure like activity. Dizzy spells with positional changes. Imbalance and unstable gait, denies use of assistive device. One fall since last visit. Daily headaches at frontal region and bitemporal region lasting all day. Photophobia, phonophobia, nausea. No vision impairment. Unchanged RLS. Generalized back, neck and arm pain with variable severity, 7-8/10. Ongoing short term memory difficulty.  - Reviewed Routine and 3 hour EEG's, within normal limits. - Referral to physiatry to consult cervicalgia and steroid injection for cervicalgia. - Stop Diclofenac due to GI distress.  - Stop Zoloft  and switch to Prozac. - Start Prozac 20 mg daily for depression.  - Continue Seroquel 25 mg, take 3 pills at night for mood and sleep difficulty. Refill. - Patient reports she was unable to pick up Seroquel from pharmacy, will check if PA needed. - Continue Effexor XR 37.5 mg daily for mood. Refill.   Medications previously tried: Imitrex (anaphylaxis) Gabapentin  (prescribed for seizures and then she stopped taking it after she stopped having seizures) Remeron  (panic attack) Diclofenac (75 mg daily) - GI distress  Follow-up with Lauraine Rocks, PA-C in 1 months. Will see Dr. Lane in 3 month.  p=5   CHIEF COMPLAINT & HPI  Ms. Rhames is a 58 y.o. female presenting for evaluation of: Chief Complaint  Patient presents with  . HX OF SEIZURES/ HX OF BRAIN TUMOR/ HEARING DIFFICULTY  . DIZZINESS/ SYNCOPE/TREMOR/ NECK PAIN/ BACK PAIN  . IMBALANCE/  MEMORY CONCERNS/ RLS    HX OF SEIZURES/ HX OF BRAIN TUMOR/ HEARING DIFFICULTY/ DIZZINESS/ SYNCOPE/TREMOR/ NECK PAIN/ BACK PAIN/ IMBALANCE/ MEMORY CONCERNS/ RLS/ Patient with no recent syncope spells or seizure like activity. Frequent dizziness with positional changes. Imbalance and unstable gait, denies use of assistive device. One fall since last visit related to lower extremity weakness, did hit her head. Daily headaches at frontal region and bitemporal region lasting all day. Photophobia, phonophobia, nausea. No vision impairment. Unchanged RLS. Generalized back, neck and arm pain with variable severity, 7-8/10. Ongoing short term memory difficulty. Has gotten lost while driving, difficulty recalling familiar locations, repeating questions and conversations. No difficulty with names and faces. Taking Seroquel 25 mg, 1-3 pills at night, Diclofenac 75 mg once day. Reports GI distress on Diclofenac, taking this with and without food. Stopped Prozac 10 mg daily as advised by pharmacist due to being on Zoloft  and was transitioned onto Effexor 37.5 mg daily. Using Boost meal supplement for weight gain. Wearing hearing aid in right ear.   DATA SUMMARY: 04/22/2024 3 HOUR EEG IMPRESSION: Within normal limits.  04/09/2024 ROUTINE EEG  IMPRESSION: Within normal limits.  09/28/2023 CT TSPINE W CONTRAST IMPRESSION:  1. Ill-defined edema in the right posterior paraspinal soft tissues  at the T10-T11 level, measuring up to approximately 1.9 x 4.5 by 4.0  cm and compatible with cellulitis/early phlegmonous change given the  provided clinical history. No discrete, drainable fluid collection  at this time. This edema involves the subcutaneous fat superficial  to the paraspinal musculature and does not appear to extend to the  canal.  2. No specific evidence of  discitis/osteomyelitis by CT. An MRI of  the thoracic spine with contrast could provide more sensitive  evaluation.  3. Aortic Atherosclerosis  (ICD10-I70.0) and Emphysema (ICD10-J43.9   12/07/2022 DG LSPINE COMPLETE IMPRESSION:  Moderate degenerative disc disease at L5-S1. No acute abnormality  seen.   12/07/2022 DG CSPINE COMPLETE IMPRESSION:  Moderate multilevel degenerative disc disease. No acute abnormality  seen.   VISIT SUMMARIES:   MEDICATIONS Current Outpatient Medications  Medication Sig Dispense Refill  . acetaminophen  (TYLENOL ) 500 MG tablet Take by mouth every 8 (eight) hours as needed       . albuterol  90 mcg/actuation inhaler Inhale into the lungs every 6 (six) hours as needed       . alendronate  (FOSAMAX ) 70 MG tablet Take by mouth every 7 (seven) days       . azelastine  (ASTELIN ) 137 mcg nasal spray Place 1 spray into both nostrils 2 (two) times daily    . clonazePAM  (KLONOPIN ) 1 MG tablet Take 1 mg by mouth 3 (three) times daily    . diclofenac (VOLTAREN) 75 MG EC tablet Take 1 tab twice a day with food for a month then decrease to 1 tab daily with food for a month then stop No over the counter pain relievers except Tylenol  90 tablet 0  . FLUoxetine (PROZAC) 10 MG capsule Take 1 capsule (10 mg total) by mouth once daily 30 capsule 2  . gabapentin  (NEURONTIN ) 300 MG capsule Take 300 mg by mouth 2 (two) times daily    . hydrOXYzine  (ATARAX ) 25 MG tablet Take 25 mg by mouth 3 (three) times daily as needed for Itching    . methocarbamoL  (ROBAXIN ) 500 MG tablet Take 500 mg by mouth 3 (three) times daily as needed    . QUEtiapine (SEROQUEL) 25 MG tablet Take 1-3 tabs nightly for sleep 90 tablet 1  . sertraline  (ZOLOFT ) 100 MG tablet Take 200 mg by mouth once daily    . tamoxifen  (NOLVADEX ) 20 MG tablet Take 20 mg by mouth once daily    . tiZANidine  (ZANAFLEX ) 4 MG capsule Take 4 mg by mouth at bedtime as needed for Muscle spasms    . varenicline  tartrate (CHANTIX ) 1 mg tablet Take 1 mg by mouth 2 (two) times daily    . venlafaxine (EFFEXOR-XR) 37.5 MG XR capsule Take 1 capsule (37.5 mg total) by mouth once daily 30  capsule 1  . diphenoxylate -atropine  (LOMOTIL ) 2.5-0.025 mg tablet TAKE 1 TABLET BY MOUTH 4 TIMES DAILY AS NEEDED FOR DIARRHEA OR LOOSE STOOLS (Patient not taking: Reported on 05/20/2024)    . lidocaine -prilocaine  (EMLA ) cream Apply to affected area once (Patient not taking: Reported on 05/20/2024)    . losartan  (COZAAR ) 50 MG tablet Take 50 mg by mouth once daily (Patient not taking: Reported on 05/20/2024)    . ondansetron  (ZOFRAN ) 8 MG tablet TAKE 1 TABLET BY MOUTH 2 TIMES DAILY AS NEEDED (Patient not taking: Reported on 05/20/2024)    . potassium chloride  (KLOR-CON ) 20 MEQ ER tablet Take by mouth 2 (two) times daily    (Patient not taking: Reported on 05/20/2024)    . prochlorperazine  (COMPAZINE ) 10 MG tablet TAKE 1 TABLET BY MOUTH EVERY 6 HOURS AS NEEDED (NAUSEA OR VOMITING) (Patient not taking: Reported on 05/20/2024)    . sennosides (SENOKOT) 8.6 mg tablet Take 1 tablet by mouth once daily (Patient not taking: Reported on 05/20/2024)    . tetrahydrozoline (VISINE) 0.05 % ophthalmic solution Apply to eye as directed    (Patient  not taking: Reported on 05/20/2024)    . traMADoL  (ULTRAM ) 50 mg tablet Take 100 mg by mouth 2 (two) times daily (Patient not taking: Reported on 05/20/2024)    . traZODone  (DESYREL ) 50 MG tablet Take 50 mg by mouth at bedtime 50-100 mg nightly for sleep (Patient not taking: Reported on 05/20/2024)    . vilazodone (VIIBRYD) 20 mg tablet Take 20 mg by mouth once daily (Patient not taking: Reported on 05/20/2024)    . zolpidem  (AMBIEN ) 10 mg tablet Take 10 mg by mouth at bedtime as needed for Sleep (Patient not taking: Reported on 05/20/2024)     No current facility-administered medications for this visit.    ALLERGIES Allergies  Allergen Reactions  . Imitrex [Sumatriptan] Anaphylaxis  . Amoxicillin  Unknown  . Penicillin Rash     EXAM   Vitals:   05/20/24 1234  BP: 118/80  Weight: 63.5 kg (140 lb)  Height: 170.2 cm (5' 7)  PainSc:   8  PainLoc: Generalized   Body  mass index is 21.93 kg/m.  GENERAL: Pleasant female.  NAD.  Normocephalic and atraumatic.  MUSCULOSKELETAL: Bulk - Normal Tone - Normal Pronator Drift - Absent bilaterally. Ambulation - Gait and station are not tested. Romberg - not tested  R/L 5/5    Shoulder abduction (deltoid/supraspinatus, axillary/suprascapular n, C5) 5/5    Elbow flexion (biceps brachii, musculoskeletal n, C5-6) 5/5    Elbow extension (triceps, radial n, C7) 5/5    Finger adduction (interossei, ulnar n, T1)  5/5    Hip flexion (iliopsoas, L1/L2) 5/5    Knee flexion (hamstrings, sciatic n, L5/S1)  5/5    Knee extension (quadriceps, femoral n, L3/4) 5/5    Ankle dorsiflexion (tibialis anterior, deep fibular n, L4/5) 5/5    Ankle plantarflexion (gastroc, tibial n, S1)   NEUROLOGICAL: MENTAL STATUS: Patient is oriented to person, place and time.  Recent memory is intact.  Remote memory is intact.  Attention span and concentration are intact.  Naming, repetition, comprehension and expressive speech are within normal limits.  Patient's fund of knowledge is within normal limits for educational level.  CRANIAL NERVES: Normal    CN II (normal visual acuity and visual fields) Normal    CN III, IV, VI (extraocular muscles are intact) Normal    CN V (facial sensation is intact bilaterally) Normal    CN VII (facial strength is intact bilaterally) Normal    CN VIII (hearing is intact bilaterally) Normal    CN IX/X (palate elevates midline, normal phonation) Normal    CN XI (shoulder shrug strength is normal and symmetric) Normal    CN XII (tongue protrudes midline)  SENSATION: Intact to pain and temp bilaterally (spinothalamic tracts) Intact to position and vibration bilaterally (dorsal columns)  REFLEXES: R/L 2+/2+    Biceps 2+/2+    Brachioradialis  2+/2+    Patellar 2+/2+    Achilles  COORDINATION/CEREBELLAR: Finger to nose testing is within normal limits.     PAST MEDICAL HISTORY Past Medical  History:  Diagnosis Date  . Anemia    during chemotherapy  . Brain tumor (benign) (CMS/HHS-HCC)   . Breast cancer (CMS/HHS-HCC) 2021   right breast  . COPD (chronic obstructive pulmonary disease) (CMS/HHS-HCC)   . Depression   . Hard of hearing   . History of chemotherapy 1999  . History of radiation therapy 1999  . History of seizure disorder   . Hypertension   . Malignant neoplasm of right breast (CMS/HHS-HCC) 07/13/2020  Right breast, post neoadjuvant chemotherapy, T2, N0, ER/PR positive, HER-2/neu not overexpressed.  SABRA PONV (postoperative nausea and vomiting)   . Seizure disorder (CMS/HHS-HCC)     PAST SURGICAL HISTORY Past Surgical History:  Procedure Laterality Date  . CRANIOTOMY FOR BRAIN TUMOR RESECTION  1991  . LAPAROSCOPIC TUBAL LIGATION  1994  . MASTECTOMY PARTIAL / LUMPECTOMY Right 1999   at [facility]  . BREAST EXCISIONAL BIOPSY Right 1999  . INCISIONAL BIOPSY BREAST Right 11/10/2019  . BREAST EXCISIONAL BIOPSY Right 11/10/2019  . PORTACATH PLACEMENT Left 11/30/2019  . MASTECTOMY SIMPLE Right 07/13/2020  . LAPAROSCOPIC TUBAL LIGATION    . TONSILLECTOMY      FAMILY HISTORY Family History  Problem Relation Name Age of Onset  . Depression Mother    . Lung cancer Father    . High blood pressure (Hypertension) Father    . Coronary Artery Disease (Blocked arteries around heart) Father    . Breast cancer Maternal Grandmother    . Ovarian cancer Paternal Aunt    . Bone cancer Maternal Grandfather    . Colon cancer Neg Hx      SOCIAL HISTORY  Social History   Tobacco Use  . Smoking status: Every Day    Current packs/day: 1.00    Average packs/day: 1 pack/day for 40.0 years (40.0 ttl pk-yrs)    Types: Cigarettes  . Smokeless tobacco: Never  Vaping Use  . Vaping status: Some Days  . Substances: CBD  Substance Use Topics  . Alcohol use: Never  . Drug use: Yes    Types: Marijuana    Comment: occasional CBD vape     REVIEW OF SYSTEMS:  13 system ROS form  was given to the patient to complete and I have reviewed it.  The form was sent for scan to the patient's EHR.  Pertinent positives and negatives are mentioned above in the HPI and all other systems are negative.   DATA  I have personally reviewed all of the data outlined below both prior to the appointment and during the appointment with the patient as appropriate.  No visits with results within 6 Month(s) from this visit.  Latest known visit with results is:  No results found for any previous visit.      No follow-ups on file.  Payor: [employer] / Plan: [facility] [employer]ED CARE / Product Type: Medicaid /   This note is partially written by Greig Pouch, scribe, in the presence of and acting as the scribe of Dr. Arthea Farrow.  I have reviewed, edited and added to the note as needed to reflect my best personal medical judgment.    Dr. Arthea Farrow, MD [facility] [place], [place] Ph:  [phone] Fax:  [phone]

## 2024-05-25 ENCOUNTER — Ambulatory Visit: Payer: MEDICAID | Admitting: Physical Therapy

## 2024-05-25 ENCOUNTER — Telehealth: Payer: Self-pay | Admitting: Physical Therapy

## 2024-05-25 NOTE — Telephone Encounter (Signed)
 Called pt to inquire about absence from PT. Pt reports thinking that her apt was tomorrow and not today. PT states that she can be rescheduled for tomorrow due to increased availability. Pt agrees and she is now going to be rescheduled for tomorrow at 9:45 am

## 2024-05-26 ENCOUNTER — Ambulatory Visit: Payer: MEDICAID | Attending: Neurosurgery | Admitting: Physical Therapy

## 2024-05-26 ENCOUNTER — Telehealth: Payer: Self-pay | Admitting: *Deleted

## 2024-05-26 DIAGNOSIS — M79621 Pain in right upper arm: Secondary | ICD-10-CM | POA: Diagnosis present

## 2024-05-26 DIAGNOSIS — M542 Cervicalgia: Secondary | ICD-10-CM | POA: Diagnosis present

## 2024-05-26 DIAGNOSIS — G8929 Other chronic pain: Secondary | ICD-10-CM | POA: Diagnosis not present

## 2024-05-26 DIAGNOSIS — M4802 Spinal stenosis, cervical region: Secondary | ICD-10-CM | POA: Diagnosis not present

## 2024-05-26 DIAGNOSIS — M25511 Pain in right shoulder: Secondary | ICD-10-CM | POA: Insufficient documentation

## 2024-05-26 DIAGNOSIS — M5412 Radiculopathy, cervical region: Secondary | ICD-10-CM | POA: Diagnosis not present

## 2024-05-26 NOTE — Therapy (Signed)
 OUTPATIENT PHYSICAL THERAPY CERVICAL EVALUATION   Patient Name: Kelsey Russo MRN: [id number] DOB:11/17/1965, 58 y.o., female Today's Date: 05/26/2024  END OF SESSION:  PT End of Session - 05/26/24 1800     Visit Number 1    Number of Visits 24    Date for Recertification  08/18/24    Authorization Type [employer]    PT Start Time 0900    PT Stop Time 0945    PT Time Calculation (min) 45 min    Activity Tolerance Patient tolerated treatment well    Behavior During Therapy WFL for tasks assessed/performed          Past Medical History:  Diagnosis Date   Anemia    during chemotherapy   Brain tumor (benign) (HCC)    Breast cancer (HCC) 1999   Status post right breast lumpectomy and radiation   Breast cancer (HCC) 11/2019   right breast   COPD (chronic obstructive pulmonary disease) (HCC)    Depression    History of chemotherapy 1999   History of seizure disorder    Hypertension    Personal history of chemotherapy    breast cancer 2021   Personal history of radiation therapy 1999   Pneumonia    PONV (postoperative nausea and vomiting)    vomiting   Past Surgical History:  Procedure Laterality Date   BRAIN SURGERY     Brain tumor resection  1991   BREAST BIOPSY Right 1999   Positive   BREAST BIOPSY Right 11/10/2019   us  bx 10:00 heart marker, positive   BREAST BIOPSY Right 11/10/2019   us  bx axilla, hydromark 4, positive   BREAST BIOPSY Left 11/10/2019   us  bx 2:00, venus marker, neg   MASTECTOMY Right 07/13/2020   positive   PORTACATH PLACEMENT Left 11/30/2019   Procedure: INSERTION PORT-A-CATH;  Surgeon: Dessa Reyes ORN, MD;  Location: [facility];  Service: General;  Laterality: Left;   Right breast lumpectomy     SIMPLE MASTECTOMY WITH AXILLARY SENTINEL NODE BIOPSY Right 07/13/2020   Procedure: SIMPLE MASTECTOMY WITH AXILLARY SENTINEL NODE BIOPSY;  Surgeon: Dessa Reyes ORN, MD;  Location: [facility];  Service: General;  Laterality: Right;    TONSILLECTOMY     TUBAL LIGATION     Patient Active Problem List   Diagnosis Date Noted   Mixed stress and urge urinary incontinence 01/19/2024   Abscess of back 10/01/2023   Cellulitis of back 09/28/2023   Depression with anxiety 09/28/2023   Primary insomnia 08/31/2023   Vitamin D  deficiency, unspecified 06/29/2023   B12 deficiency due to diet 06/29/2023   Mixed hyperlipidemia 06/29/2023   Prediabetes 06/29/2023   Generalized anxiety disorder 02/12/2023   Caregiver stress 12/21/2022   Tobacco use disorder, continuous 12/21/2022   Chronic obstructive pulmonary disease, unspecified COPD type (HCC) 10/12/2022   High risk medication use 02/19/2022   Syncope and collapse 02/23/2020   Palpitations 02/23/2020   Essential hypertension 02/23/2020   Goals of care, counseling/discussion 11/26/2019   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 Alan Arrant    REFERRING PROVIDER: Dr. Reeves Daisy  REFERRING DIAG:  [id number] (ICD-10-CM) - Cervical stenosis of spinal canal M54.12 (ICD-10-CM) - Cervical radiculopathy M25.511,G89.29 (ICD-10-CM) - Chronic right shoulder pain  For neck and arm pain, R shoulder decreased range of motion  M48.02 (ICD-10-CM) - Cervical stenosis of spinal canal M54.12 (ICD-10-CM) - Cervical radiculopathy M25.511,G89.29 (ICD-10-CM) - Chronic right shoulder pain  THERAPY DIAG:  Pain in right upper arm - Plan: PT plan of care cert/re-cert  Cervicalgia - Plan: PT plan of care cert/re-cert  Rationale for Evaluation and Treatment: Rehabilitation  ONSET DATE: Summer 2024    SUBJECTIVE:                                                                                                                                                                                                         SUBJECTIVE STATEMENT: See pertinent history  Hand dominance: Right  PERTINENT HISTORY:  Pt reports that her biggest issue is  her right shoulder pain. It started last summer, when she was leaning forward and using her right arm to support herself while she reached forward with her left hand. She has been to [facility] where she said she was diagnosed with bursiitis and she received a shot in right shoulder. The shot did not help much. She has been off and on a variety of meds, but nothing has seemed to really help with right shoulder pain. She reports that her neck pain started when she was an adolescent. Of most concern, pt reports syncopal episodes where she is standing and then blacks out and falls and she is not sure why. This has now happened several times, and luckily it has not resulted in a significant injury. Dr. Lane, her neurologist, is following up on falls with report that she stopped taking her ant-seizure meds, so this could be potential cause for falls, but not entirely clear because EEG was in wnl.    PAIN:  Are you having pain? Yes: NPRS scale: 10/10 (right shoulder), 7/10 cervical central spinous process C4-C7   Pain location: anterior portion of right shoulder and central spinous process of cervical spine     Pain description: Throbbing and achy  Aggravating factors: Reaching upwards  Relieving factors: Nothing really helps     PRECAUTIONS: None  RED FLAGS: Cervical red flags: Dysphagia No, Dysmetria No, Diplopia Yes: Did not assess, Nystagmus No, and Nausea No     WEIGHT BEARING RESTRICTIONS: No  FALLS:  Has patient fallen in last 6 months? Yes. Number of falls 4-6; she describes that her legs just give out and she losses her balance and she describes syncopal episodes when walking    LIVING ENVIRONMENT: Lives with: lives with their son Lives in: House/apartment Stairs: No Has following equipment at home: None  OCCUPATION: Not working     PLOF: Independent  PATIENT GOALS: Alleviate her shoulder and neck pain    NEXT MD VISIT: June 23 2024  OBJECTIVE:  Note: Objective measures  were completed at Evaluation unless otherwise noted.  VITALS  BP 120/83 HR 89 SpO2  100%  DIAGNOSTIC FINDINGS:  CLINICAL DATA:  Neck pain.   EXAM: CERVICAL SPINE - COMPLETE 4+ VIEW   COMPARISON:  None Available.   FINDINGS: There is no evidence of cervical spine fracture or prevertebral soft tissue swelling. Alignment is normal. Moderate degenerative disc disease is noted at C3-4, C4-5, C5-6 and C6-7 with anterior osteophyte formation.   IMPRESSION: Moderate multilevel degenerative disc disease. No acute abnormality seen.     Electronically Signed   By: Lynwood Landy Raddle M.D.   On: 12/10/2022 09:12  PATIENT SURVEYS:  NDI:  NECK DISABILITY INDEX  Date: 05/26/24 Score  Pain intensity 3 = The pain is fairly severe at the moment  2. Personal care (washing, dressing, etc.) 2 = It is painful to look after myself and I am slow and careful  3. Lifting 4 =  I can only lift very light weights  4. Reading 2 =  I can read as much as I want with moderate pain in my neck  5. Headaches 3 = I have moderate headaches, which come frequently  6. Concentration 1 =  I can concentrate fully when I want to with slight difficulty   7. Work 4 = I can hardly do any work at all  8. Driving 3 = I can't drive my car as long as I want because of moderate pain in my neck  9. Sleeping 4 = My sleep is greatly disturbed (3-5 hrs sleepless)   10. Recreation 1 =  I am able to engage in all my recreation activities, with some pain in my neck  Total 27/50 (54%)   Minimum Detectable Change (90% confidence): 5 points or 10% points  COGNITION: Overall cognitive status: Within functional limits for tasks assessed  SENSATION: WFL Light touch: WFL and Impaired O  POSTURE: rounded shoulders  PALPATION: Right shoulder bicipital groove TTP     CERVICAL ROM:   Active ROM A/PROM (deg) eval  Flexion 45  Extension 20*  Right lateral flexion 45  Left lateral flexion 45  Right rotation 60  Left rotation 60     (Blank rows = not tested)  UPPER EXTREMITY ROM:  Active ROM Right eval Left eval  Shoulder flexion 140 NT   Shoulder extension    Shoulder abduction 90*/90* empty end field NT  Shoulder adduction    Shoulder extension    Shoulder internal rotation    Shoulder external rotation at 90 deg 80 NT  Elbow flexion    Elbow extension    Wrist flexion    Wrist extension    Wrist ulnar deviation    Wrist radial deviation    Wrist pronation    Wrist supination     (Blank rows = not tested)  UPPER EXTREMITY MMT:  MMT Right eval Left eval  Shoulder flexion 4- 4  Shoulder extension    Shoulder abduction 4- 4  Shoulder adduction    Shoulder extension    Shoulder internal rotation 4 4  Shoulder external rotation 4 4  Shoulder internal rotation at 90 deg 4 4  Shoulder external rotation at 90 deg  4 4  Middle trapezius    Lower trapezius    Elbow flexion    Elbow extension    Wrist flexion    Wrist extension    Wrist ulnar deviation    Wrist radial deviation  Wrist pronation    Wrist supination    Grip strength     (Blank rows = not tested)  CERVICAL SPECIAL TESTS:  Cranial cervical flexion test: Negative  FUNCTIONAL TESTS:  Not performed    TREATMENT DATE: 05/26/24 THEREX  Supine Chin Tucks with 3 sec hold 1 x 10   Supine  Shoulder Flexion AAROM with PVC Pipe to 120 degrees 2 x 10  Seated Shoulder Abduction/Adduction AAROM in scaption with PVC pipe 2 x 10                                                                                                                 PATIENT EDUCATION:  Education details: Form and technique for correct performance of exercise and explanation about underlying deficits   Person educated: Patient Education method: Programmer, multimedia, Demonstration, Verbal cues, and Handouts Education comprehension: verbalized understanding, returned demonstration, and verbal cues required  HOME EXERCISE PROGRAM: Access Code: [email or web address] URL:  [email or web address] Date: 05/26/2024 Prepared by: Toribio Servant  Exercises - Scare the Bear   - 1 x daily - 7 x weekly - 2 sets - 10 reps - Supine Cervical Retraction with Towel  - 1 x daily - 7 x weekly - 2 sets - 10 reps - 3 sec hold - Seated Shoulder Abduction AAROM with Dowel (Mirrored)  - 1 x daily - 7 x weekly - 2 sets - 10 reps  ASSESSMENT:  CLINICAL IMPRESSION: Patient is a 58 y.o. white female who was seen today for physical therapy evaluation and treatment for chronic right shoulder pain and neck pain in setting of cervical stenosis. Right shoulder pain appears to be separate from cervical pain as evidenced by none radiating nature and negative tests with cervical radiculopathy cluster. She has signs and symptoms of subacromial impingement syndrome of right shoulder as evidence by pain elicited by reaching overhead and pain with internal rotation. However, most rotator cuff pathology ruled out with negative special tests, but further examination needed here. Her cervicalgia is in the setting of cervical stenosis with pain elicited by cervical extension. Her deficits include decreased cervical and shoulder mobility as well as    OBJECTIVE IMPAIRMENTS: decreased balance, decreased knowledge of condition, decreased ROM, decreased strength, hypomobility, impaired perceived functional ability, increased muscle spasms, impaired UE functional use, postural dysfunction, and pain.   ACTIVITY LIMITATIONS: carrying, lifting, bending, sleeping, bathing, dressing, reach over head, and hygiene/grooming  PARTICIPATION LIMITATIONS: cleaning, laundry, shopping, and yard work  PERSONAL FACTORS: Fitness, Social background, Time since onset of injury/illness/exacerbation, and 3+ comorbidities: Severe depression, tobacco use, h/o right sided breast cancer.  are also affecting patient's functional outcome.   REHAB POTENTIAL: Fair chronicity of cervical pain, ineffective prior interventions  and multiple co-morbidities   CLINICAL DECISION MAKING: Evolving/moderate complexity  EVALUATION COMPLEXITY: Moderate   GOALS: Goals reviewed with patient? No  SHORT TERM GOALS: Target date: 06/09/2024  Patient will demonstrate undestanding of home exercise plan by performing exercises correctly with evidence of good carry over with min to no verbal or  tactile cues .   Baseline: NT  Goal status: INITIAL  2.  Patient will understand the importance of avoiding cervical extension and shoulder internal rotation movements to avoid worsening her symptoms and to improve her cervical and shoulder function to resume UE tasks like reaching for self-grooming and home care.  Baseline: NT  Goal status: INITIAL    LONG TERM GOALS: Target date: 08/18/2024   Patient will show a statistically significant improvement in her neck function as evidence by >=7.5 pt decrease in her neck disability index score to better be able to move neck to improve visual field while driving to avoid accidents. (Young et al, 2009) Baseline: 54% (27/50)   Goal status: INITIAL   2. Patient will improve right shoulder function as evidenced by a decrease in QuickDash score of >=15 pts to return to using  it more reliably for self- grooming and house maintenance tasks ETTER Brine, F. et al. 2013). Baseline: NT   Goal status: INITIAL  3.  Patient will improve right shoulder AROM to be within 10% of none affected or left shoulder AROM for improved right shoulder function to perform UE functions like reaching overhead for self grooming tasks like combing hair.  Baseline: Shoulder Abd R/L 90/ NT Shoulder R/L 140/ NT    Goal status: INITIAL  4.  Patient will improve shoulder and periscapular strength to be within 1/3 grade MMT (ie 4- to 4) for improved cervical spine stability and cervical and shoulder function to resume UE tasks like reaching over head to comb hair or place objects in her cupboard without being limited by  pain and discomfort.  Baseline: Shoulder Flex R/L 4-/4, Shoulder Abd R/L 4-/4, Periscapular testing not performed   Goal status: INITIAL  5.  Patient will be able perform combined external rotation of right hand to occiput to comb her and wash hair without exceeding NRPS >=5/10 NRPS as evidence of improved right shoulder function and tissue healing to regain UE function for self grooming tasks.  Baseline: 8-9/10 NRPS  right anterior portion of the shoulder.  Goal status: INITIAL    PLAN:  PT FREQUENCY: 1-2x/week  PT DURATION: 12 weeks  PLANNED INTERVENTIONS: 97110-Therapeutic exercises, 97530- Therapeutic activity, [id number]- Neuromuscular re-education, 97535- Self Care, 02859- Manual therapy, [phone]- Gait training, [phone]- Canalith repositioning, [id number]- Aquatic Therapy, [phone]- Electrical stimulation (unattended), [phone]- Electrical stimulation (manual), [id number]- Traction (mechanical), 20560 (1-2 muscles), 20561 (3+ muscles)- Dry Needling, Patient/Family education, Balance training, Stair training, Taping, Joint mobilization, Joint manipulation, Spinal manipulation, Spinal mobilization, Vestibular training, DME instructions, Cryotherapy, Moist heat, and Biofeedback  PLAN FOR NEXT SESSION: Follow up on falls: orthostatic vitals and 4 stage balance.  Take QuickDash. Further testing of cervical radiculopathy cluster: Spurling's and ULTT to fully rule in or out cervical radiculopathy. Left shoulder AROM testing and finish periscapular testing.  Begin shoulder mobility and strengthening exercises: scapular retraction and shoulder IR/ER at 0 deg abduction. Speed's testing and further rotator cuff testing for subscapularis-lift off    Toribio Servant PT, DPT  [facility] [address], [place], [address] Phone: [phone]   Fax:  [phone]

## 2024-05-26 NOTE — Telephone Encounter (Signed)
 Is going to her PT and the person that was working with her wanted to go over her medicine and she says that tamoxifen  and Prozac should not be used together.  She wants to know from Dr. Jacobo what to do. She would want call back.

## 2024-05-27 ENCOUNTER — Encounter: Payer: Self-pay | Admitting: *Deleted

## 2024-05-28 ENCOUNTER — Telehealth: Payer: Self-pay | Admitting: *Deleted

## 2024-05-28 ENCOUNTER — Telehealth: Payer: Self-pay | Admitting: Family

## 2024-05-28 NOTE — Telephone Encounter (Signed)
 Patient left VM stating that her neurologist d/c her tramadol  and put her on diclofenac. Well now they have d/c her diclofenac and she is wanting Alan to restart her tramadol .   I spoke with Alan verbally and she said she is not going to restart the patient's tramadol . She said since neurology decided to be the ones to d/c it, it would need to come from them from now on moving forward.

## 2024-05-28 NOTE — Telephone Encounter (Signed)
 Dr. Lane wants to have some pill with this patient because she is on Prozac and it is interferes with the tamoxifen .  She has sertraline  but it is not working good for her.  They wanted to know if Dr. Jacobo again knows of any other medicine for this patient mood while she is on the tamoxifen 

## 2024-05-29 ENCOUNTER — Encounter: Payer: Self-pay | Admitting: Family

## 2024-06-02 ENCOUNTER — Ambulatory Visit: Payer: MEDICAID | Admitting: Physical Therapy

## 2024-06-03 ENCOUNTER — Encounter: Payer: MEDICAID | Admitting: Physical Therapy

## 2024-06-04 ENCOUNTER — Other Ambulatory Visit: Payer: Self-pay | Admitting: Family

## 2024-06-05 ENCOUNTER — Ambulatory Visit: Payer: MEDICAID | Admitting: Family

## 2024-06-05 ENCOUNTER — Encounter: Payer: Self-pay | Admitting: Family

## 2024-06-05 VITALS — BP 140/84 | HR 90 | Ht 67.0 in | Wt 138.4 lb

## 2024-06-05 DIAGNOSIS — E782 Mixed hyperlipidemia: Secondary | ICD-10-CM | POA: Diagnosis not present

## 2024-06-05 DIAGNOSIS — M25511 Pain in right shoulder: Secondary | ICD-10-CM

## 2024-06-05 DIAGNOSIS — G8929 Other chronic pain: Secondary | ICD-10-CM

## 2024-06-05 DIAGNOSIS — I10 Essential (primary) hypertension: Secondary | ICD-10-CM

## 2024-06-05 DIAGNOSIS — R7303 Prediabetes: Secondary | ICD-10-CM

## 2024-06-05 DIAGNOSIS — Z1329 Encounter for screening for other suspected endocrine disorder: Secondary | ICD-10-CM

## 2024-06-05 MED ORDER — CLONAZEPAM 1 MG PO TABS: 1.0000 mg | ORAL_TABLET | Freq: Three times a day (TID) | ORAL | 2 refills | Status: DC | PRN

## 2024-06-05 MED ORDER — TRAMADOL HCL 50 MG PO TABS: 50.0000 mg | ORAL_TABLET | Freq: Four times a day (QID) | ORAL | 0 refills | Status: DC | PRN

## 2024-06-05 NOTE — Assessment & Plan Note (Signed)
 Blood pressure well controlled with current medications.  Continue current therapy.  Will reassess at follow up.   - CBC w/Diff - CMP w/eGFR

## 2024-06-05 NOTE — Progress Notes (Signed)
 Established Patient Office Visit  Subjective:  Patient ID: Kelsey Russo, female    DOB: 1966-03-07  Age: 58 y.o. MRN: [id number]  Chief Complaint  Patient presents with   Follow-up    3 month follow up    Patient is here today for her 3 months follow up.  She has been feeling fairly well since last appointment.   She does have additional concerns to discuss today.  1) She needs refills for her pain medication and her anxiety medications. She asks if we are going to need to refer her to a pain clinic to get her tramadol . 2) Her right ear has been bothering her, feeling full and itchy.  She asks if we can look at this.  3) Needs a letter for her dentures.  4) Asks for more Nurtec samples if possible.  5) Still having pain in her right shoulder, asks if we can send a referral to ortho for her as well.    Labs are due today.  She needs refills.   I have reviewed her active problem list, medication list, allergies, health maintenance, notes from last encounter, lab results for her appointment today.      No other concerns at this time.   Past Medical History:  Diagnosis Date   Anemia    during chemotherapy   Brain tumor (benign) (HCC)    Breast cancer (HCC) 1999   Status post right breast lumpectomy and radiation   Breast cancer (HCC) 11/2019   right breast   COPD (chronic obstructive pulmonary disease) (HCC)    Depression    History of chemotherapy 1999   History of seizure disorder    Hypertension    Personal history of chemotherapy    breast cancer 2021   Personal history of radiation therapy 1999   Pneumonia    PONV (postoperative nausea and vomiting)    vomiting    Past Surgical History:  Procedure Laterality Date   BRAIN SURGERY     Brain tumor resection  1991   BREAST BIOPSY Right 1999   Positive   BREAST BIOPSY Right 11/10/2019   us  bx 10:00 heart marker, positive   BREAST BIOPSY Right 11/10/2019   us  bx axilla, hydromark 4, positive   BREAST  BIOPSY Left 11/10/2019   us  bx 2:00, venus marker, neg   MASTECTOMY Right 07/13/2020   positive   PORTACATH PLACEMENT Left 11/30/2019   Procedure: INSERTION PORT-A-CATH;  Surgeon: Dessa Reyes ORN, MD;  Location: [facility] OR[facility];  Service: General;  Laterality: Left;   Right breast lumpectomy     SIMPLE MASTECTOMY WITH AXILLARY SENTINEL NODE BIOPSY Right 07/13/2020   Procedure: SIMPLE MASTECTOMY WITH AXILLARY SENTINEL NODE BIOPSY;  Surgeon: Dessa Reyes ORN, MD;  Location: [facility] OR[facility];  Service: General;  Laterality: Right;   TONSILLECTOMY     TUBAL LIGATION      Social History   Socioeconomic History   Marital status: Widowed    Spouse name: Not on file   Number of children: 2   Years of education: Not on file   Highest education level: Associate degree: academic program  Occupational History   Not on file  Tobacco Use   Smoking status: Every Day    Current packs/day: 0.25    Average packs/day: 0.3 packs/day for 40.0 years (10.0 ttl pk-yrs)    Types: Cigarettes   Smokeless tobacco: Never  Vaping Use   Vaping status: Never Used  Substance and Sexual Activity   Alcohol use:  No    Alcohol/week: 0.0 standard drinks of alcohol   Drug use: Not Currently    Frequency: 3.0 times per week    Types: Marijuana   Sexual activity: Not on file    Comment: not asked if sexually active  Other Topics Concern   Not on file  Social History Narrative   Lives at home with husband.   Social Drivers of Corporate investment banker Strain: Low Risk  (05/20/2024)   Received from [facility]   Overall Financial Resource Strain (CARDIA)    Difficulty of Paying Living Expenses: Not hard at all  Recent Concern: Financial Resource Strain - High Risk (03/18/2024)   Received from [facility]   Overall Financial Resource Strain (CARDIA)    Difficulty of Paying Living Expenses: Very hard  Food Insecurity: No Food Insecurity (05/20/2024)   Received from [facility]   Hunger Vital Sign    Within the past 12 months, you worried that your food would run out before you got the money to buy more.: Never true    Within the past 12 months, the food you bought just didn't last and you didn't have money to get more.: Never true  Recent Concern: Food Insecurity - Food Insecurity Present (03/18/2024)   Received from [facility]   Hunger Vital Sign    Within the past 12 months, you worried that your food would run out before you got the money to buy more.: Sometimes true    Within the past 12 months, the food you bought just didn't last and you didn't have money to get more.: Sometimes true  Transportation Needs: No Transportation Needs (05/20/2024)   Received from [facility] - Transportation    In the past 12 months, has lack of transportation kept you from medical appointments or from getting medications?: No    Lack of Transportation (Non-Medical): No  Recent Concern: Transportation Needs - Unmet Transportation Needs (03/18/2024)   Received from [facility] - Transportation    In the past 12 months, has lack of transportation kept you from medical appointments or from getting medications?: Yes    Lack of Transportation (Non-Medical): Yes  Physical Activity: Not on file  Stress: Not on file  Social Connections: Socially Isolated (09/28/2023)   Social Connection and Isolation Panel    Frequency of Communication with Friends and Family: More than three times a week    Frequency of Social Gatherings with Friends and Family: Three times a week    Attends Religious Services: Never    Active Member of Clubs or Organizations: No    Attends Banker Meetings: Never    Marital Status: Widowed  Intimate Partner Violence: Not At Risk (09/28/2023)   Humiliation, Afraid, Rape, and Kick questionnaire    Fear of Current or Ex-Partner: No    Emotionally Abused: No     Physically Abused: No    Sexually Abused: No    Family History  Problem Relation Age of Onset   Depression Mother    CAD Father    Hypertension Father    Lung cancer Father    Coronary artery disease Father 31   Schizophrenia Maternal Uncle    Breast cancer Maternal Grandmother    Bipolar disorder Daughter     Allergies  Allergen Reactions   Imitrex [Sumatriptan] Anaphylaxis   Penicillins Rash    Tolerated  augmentin  in 2021   Amoxicillin      Review of Systems  HENT:  Positive for ear pain.        Itching in ear Ill fitting dentures  Musculoskeletal:  Positive for joint pain.  All other systems reviewed and are negative.      Objective:   BP (!) 140/84   Pulse 90   Ht 5' 7 (1.702 m)   Wt 138 lb 6.4 oz (62.8 kg)   SpO2 99%   BMI 21.68 kg/m   Vitals:   06/05/24 1104  BP: (!) 140/84  Pulse: 90  Height: 5' 7 (1.702 m)  Weight: 138 lb 6.4 oz (62.8 kg)  SpO2: 99%  BMI (Calculated): 21.67    Physical Exam Vitals and nursing note reviewed.  Constitutional:      Appearance: Normal appearance. She is normal weight.  HENT:     Head: Normocephalic.  Eyes:     Extraocular Movements: Extraocular movements intact.     Conjunctiva/sclera: Conjunctivae normal.     Pupils: Pupils are equal, round, and reactive to light.  Cardiovascular:     Rate and Rhythm: Normal rate.  Pulmonary:     Effort: Pulmonary effort is normal.  Neurological:     General: No focal deficit present.     Mental Status: She is alert and oriented to person, place, and time. Mental status is at baseline.  Psychiatric:        Mood and Affect: Mood normal.        Behavior: Behavior normal.        Thought Content: Thought content normal.        Judgment: Judgment normal.      No results found for any visits on 06/05/24.  No results found for this or any previous visit (from the past 2160 hours).     Assessment & Plan Chronic right shoulder pain Setting patient up for referral to  Ortho.  Will defer to them for further treatment changes.  Reassess at follow up.  Essential hypertension Blood pressure well controlled with current medications.  Continue current therapy.  Will reassess at follow up.   - CBC w/Diff - CMP w/eGFR  Prediabetes A1C Continues to be in prediabetic ranges.  Will reassess at follow up after next lab check.  Patient counseled on dietary choices and verbalized understanding.   -CBC w/Diff -CMP w/eGFR -Hemoglobin A1C  Mixed hyperlipidemia Checking labs today.  Continue current therapy for lipid control. Will modify as needed based on labwork results.   -CMP w/eGFR -Lipid Panel  Screening for thyroid  disorder Checking labs today.  Will continue supplements as needed.   - TSH     Return in about 3 months (around 09/05/2024).   Total time spent: 30 minutes  ALAN CHRISTELLA ARRANT, FNP  06/05/2024   This document may have been prepared by [facility] Voice Recognition software and as such may include unintentional dictation errors.

## 2024-06-05 NOTE — Assessment & Plan Note (Signed)
 Checking labs today.  Continue current therapy for lipid control. Will modify as needed based on labwork results.   -CMP w/eGFR -Lipid Panel

## 2024-06-05 NOTE — Assessment & Plan Note (Signed)
.  A1C Continues to be in prediabetic ranges.  Will reassess at follow up after next lab check.  Patient counseled on dietary choices and verbalized understanding.   -CBC w/Diff -CMP w/eGFR -Hemoglobin A1C

## 2024-06-06 LAB — LIPID PANEL
Chol/HDL Ratio: 2.5 ratio (ref 0.0–4.4)
Cholesterol, Total: 191 mg/dL (ref 100–199)
HDL: 77 mg/dL (ref >39)
LDL Chol Calc (NIH): 101 mg/dL — ABNORMAL HIGH (ref 0–99)
Triglycerides: 71 mg/dL (ref 0–149)
VLDL Cholesterol Cal: 13 mg/dL (ref 5–40)

## 2024-06-06 LAB — HEMOGLOBIN A1C
Est. average glucose Bld gHb Est-mCnc: 100 mg/dL
Hgb A1c MFr Bld: 5.1 % (ref 4.8–5.6)

## 2024-06-06 LAB — CMP14+EGFR
ALT: 13 IU/L (ref 0–32)
AST: 27 IU/L (ref 0–40)
Albumin: 4.5 g/dL (ref 3.8–4.9)
Alkaline Phosphatase: 79 IU/L (ref 49–135)
BUN/Creatinine Ratio: 14 (ref 9–23)
BUN: 14 mg/dL (ref 6–24)
Bilirubin Total: 0.6 mg/dL (ref 0.0–1.2)
CO2: 20 mmol/L (ref 20–29)
Calcium: 9.6 mg/dL (ref 8.7–10.2)
Chloride: 102 mmol/L (ref 96–106)
Creatinine, Ser: 1 mg/dL (ref 0.57–1.00)
Globulin, Total: 2.5 g/dL (ref 1.5–4.5)
Glucose: 99 mg/dL (ref 70–99)
Potassium: 3.6 mmol/L (ref 3.5–5.2)
Sodium: 138 mmol/L (ref 134–144)
Total Protein: 7 g/dL (ref 6.0–8.5)
eGFR: 65 mL/min/1.73 (ref >59)

## 2024-06-06 LAB — CBC WITH DIFF/PLATELET
Basophils Absolute: 0.1 x10E3/uL (ref 0.0–0.2)
Basos: 1 %
EOS (ABSOLUTE): 0.1 x10E3/uL (ref 0.0–0.4)
Eos: 1 %
Hematocrit: 44.5 % (ref 34.0–46.6)
Hemoglobin: 14.7 g/dL (ref 11.1–15.9)
Immature Grans (Abs): 0 x10E3/uL (ref 0.0–0.1)
Immature Granulocytes: 0 %
Lymphocytes Absolute: 2 x10E3/uL (ref 0.7–3.1)
Lymphs: 20 %
MCH: 30.4 pg (ref 26.6–33.0)
MCHC: 33 g/dL (ref 31.5–35.7)
MCV: 92 fL (ref 79–97)
Monocytes Absolute: 0.6 x10E3/uL (ref 0.1–0.9)
Monocytes: 6 %
Neutrophils Absolute: 7.5 x10E3/uL — ABNORMAL HIGH (ref 1.4–7.0)
Neutrophils: 72 %
Platelets: 202 x10E3/uL (ref 150–450)
RBC: 4.83 x10E6/uL (ref 3.77–5.28)
RDW: 14.8 % (ref 11.7–15.4)
WBC: 10.4 x10E3/uL (ref 3.4–10.8)

## 2024-06-06 LAB — TSH: TSH: 1.89 u[IU]/mL (ref 0.450–4.500)

## 2024-06-08 ENCOUNTER — Ambulatory Visit: Payer: MEDICAID | Admitting: Physical Therapy

## 2024-06-09 ENCOUNTER — Encounter: Payer: MEDICAID | Admitting: Physical Therapy

## 2024-06-09 ENCOUNTER — Encounter: Payer: Self-pay | Admitting: Family

## 2024-06-09 ENCOUNTER — Other Ambulatory Visit: Payer: Self-pay | Admitting: Family

## 2024-06-10 ENCOUNTER — Ambulatory Visit: Payer: MEDICAID | Admitting: Physical Therapy

## 2024-06-10 ENCOUNTER — Telehealth: Payer: Self-pay | Admitting: Family

## 2024-06-10 ENCOUNTER — Encounter: Payer: Self-pay | Admitting: Family

## 2024-06-10 NOTE — Telephone Encounter (Signed)
 Patient called in asking for trazodone  for sleep and the muscle relaxer that you two discussed at her appointment.  Please advise if these are appropriate and if so please send to [facility].

## 2024-06-12 ENCOUNTER — Other Ambulatory Visit: Payer: Self-pay

## 2024-06-12 MED ORDER — CYCLOBENZAPRINE HCL 10 MG PO TABS: 10.0000 mg | ORAL_TABLET | Freq: Two times a day (BID) | ORAL | 0 refills | Status: DC

## 2024-06-12 MED ORDER — METAXALONE 800 MG PO TABS: 800.0000 mg | ORAL_TABLET | Freq: Three times a day (TID) | ORAL | 0 refills | Status: DC

## 2024-06-12 NOTE — Telephone Encounter (Signed)
 See other message

## 2024-06-15 ENCOUNTER — Telehealth: Payer: Self-pay | Admitting: *Deleted

## 2024-06-15 ENCOUNTER — Ambulatory Visit: Payer: MEDICAID | Admitting: Physical Therapy

## 2024-06-15 NOTE — Telephone Encounter (Signed)
 Courtney from [facility] neurology says that they have not heard from the September 25 where they were asking tamoxifen  and Prozac should not be used together.  He wanted help to see what could the patient do in this case.

## 2024-06-16 NOTE — Telephone Encounter (Signed)
 Alan has already sent the patient in some medication

## 2024-06-17 ENCOUNTER — Ambulatory Visit: Payer: MEDICAID | Admitting: Physical Therapy

## 2024-06-17 ENCOUNTER — Other Ambulatory Visit: Payer: Self-pay | Admitting: Family

## 2024-06-22 ENCOUNTER — Encounter: Payer: MEDICAID | Admitting: Physical Therapy

## 2024-06-23 ENCOUNTER — Ambulatory Visit: Payer: MEDICAID | Admitting: Neurosurgery

## 2024-06-23 ENCOUNTER — Other Ambulatory Visit: Payer: Self-pay

## 2024-06-23 MED ORDER — CYCLOBENZAPRINE HCL 10 MG PO TABS: 10.0000 mg | ORAL_TABLET | Freq: Two times a day (BID) | ORAL | 0 refills | Status: DC

## 2024-06-24 ENCOUNTER — Telehealth: Payer: Self-pay | Admitting: Physical Therapy

## 2024-06-24 ENCOUNTER — Ambulatory Visit: Payer: MEDICAID | Attending: Neurosurgery | Admitting: Physical Therapy

## 2024-06-24 NOTE — Telephone Encounter (Signed)
 Called pt to inquire about absence. Only able to reach her daughter who said she would rely message about her mom missing appointment and that if she misses another apt then she will be removed from the schedule per attendance policy. Daughter said her mom is probably dealing with grief considering this is the one year anniversary of her husband.

## 2024-06-29 ENCOUNTER — Ambulatory Visit: Payer: MEDICAID | Admitting: Physical Therapy

## 2024-06-29 ENCOUNTER — Telehealth: Payer: Self-pay | Admitting: Physical Therapy

## 2024-06-29 NOTE — Telephone Encounter (Signed)
 Called pt to inquire about absence from PT, but could not reach. Given that this is her second no show, per policy, he remaining appointments will be taken off the schedule and she will need to call back in order to reschedule.

## 2024-06-30 ENCOUNTER — Other Ambulatory Visit: Payer: Self-pay | Admitting: Family

## 2024-07-01 ENCOUNTER — Other Ambulatory Visit: Payer: Self-pay | Admitting: Family

## 2024-07-01 ENCOUNTER — Ambulatory Visit: Payer: MEDICAID | Admitting: Physical Therapy

## 2024-07-01 ENCOUNTER — Other Ambulatory Visit: Payer: Self-pay | Admitting: Physical Medicine & Rehabilitation

## 2024-07-01 DIAGNOSIS — M5412 Radiculopathy, cervical region: Secondary | ICD-10-CM

## 2024-07-03 ENCOUNTER — Ambulatory Visit: Payer: Self-pay

## 2024-07-03 NOTE — Telephone Encounter (Signed)
Md responded via [employer]

## 2024-07-06 ENCOUNTER — Ambulatory Visit: Payer: MEDICAID | Admitting: Physical Therapy

## 2024-07-07 ENCOUNTER — Ambulatory Visit: Payer: MEDICAID | Admitting: Neurosurgery

## 2024-07-09 ENCOUNTER — Encounter: Payer: MEDICAID | Admitting: Physical Therapy

## 2024-07-09 ENCOUNTER — Other Ambulatory Visit: Payer: Self-pay | Admitting: Family

## 2024-07-13 ENCOUNTER — Encounter: Payer: MEDICAID | Admitting: Physical Therapy

## 2024-07-15 ENCOUNTER — Encounter: Payer: MEDICAID | Admitting: Physical Therapy

## 2024-07-20 ENCOUNTER — Ambulatory Visit: Payer: MEDICAID

## 2024-07-22 ENCOUNTER — Other Ambulatory Visit: Payer: Self-pay | Admitting: Family

## 2024-07-23 ENCOUNTER — Other Ambulatory Visit: Payer: Self-pay

## 2024-07-23 DIAGNOSIS — G8929 Other chronic pain: Secondary | ICD-10-CM

## 2024-07-28 ENCOUNTER — Other Ambulatory Visit: Payer: Self-pay | Admitting: Family

## 2024-07-28 ENCOUNTER — Ambulatory Visit
Admission: RE | Admit: 2024-07-28 | Discharge: 2024-07-28 | Disposition: A | Discharge status: Home / Self Care | Payer: MEDICAID | Source: Ambulatory Visit | Attending: Physical Medicine & Rehabilitation | Admitting: Physical Medicine & Rehabilitation

## 2024-07-28 DIAGNOSIS — M5412 Radiculopathy, cervical region: Secondary | ICD-10-CM

## 2024-07-28 MED ORDER — IOPAMIDOL (ISOVUE-300) INJECTION 61%
3.0000 mL | Freq: Once | INTRAVENOUS | Status: AC | PRN
Start: 2024-07-28 — End: 2024-07-28
  Administered 2024-07-28: 3 mL

## 2024-07-28 MED ORDER — TRIAMCINOLONE ACETONIDE 40 MG/ML IJ SUSP (RADIOLOGY)
60.0000 mg | Freq: Once | INTRAMUSCULAR | Status: AC
  Administered 2024-07-28: 60 mg via EPIDURAL

## 2024-07-28 NOTE — Discharge Instructions (Signed)

## 2024-08-03 ENCOUNTER — Emergency Department: Admission: EM | Admit: 2024-08-03 | Discharge: 2024-08-04 | Disposition: A | Payer: MEDICAID

## 2024-08-03 ENCOUNTER — Emergency Department: Payer: MEDICAID

## 2024-08-03 ENCOUNTER — Telehealth: Payer: Self-pay | Admitting: Family

## 2024-08-03 ENCOUNTER — Other Ambulatory Visit: Payer: Self-pay

## 2024-08-03 DIAGNOSIS — E876 Hypokalemia: Secondary | ICD-10-CM | POA: Insufficient documentation

## 2024-08-03 DIAGNOSIS — I10 Essential (primary) hypertension: Secondary | ICD-10-CM | POA: Insufficient documentation

## 2024-08-03 DIAGNOSIS — R4020 Unspecified coma: Secondary | ICD-10-CM

## 2024-08-03 DIAGNOSIS — R55 Syncope and collapse: Secondary | ICD-10-CM | POA: Insufficient documentation

## 2024-08-03 DIAGNOSIS — W19XXXA Unspecified fall, initial encounter: Secondary | ICD-10-CM | POA: Insufficient documentation

## 2024-08-03 DIAGNOSIS — I959 Hypotension, unspecified: Secondary | ICD-10-CM | POA: Insufficient documentation

## 2024-08-03 DIAGNOSIS — E871 Hypo-osmolality and hyponatremia: Secondary | ICD-10-CM | POA: Insufficient documentation

## 2024-08-03 DIAGNOSIS — J449 Chronic obstructive pulmonary disease, unspecified: Secondary | ICD-10-CM | POA: Insufficient documentation

## 2024-08-03 DIAGNOSIS — N179 Acute kidney failure, unspecified: Secondary | ICD-10-CM | POA: Insufficient documentation

## 2024-08-03 DIAGNOSIS — E860 Dehydration: Secondary | ICD-10-CM | POA: Insufficient documentation

## 2024-08-03 LAB — CBC WITH DIFFERENTIAL/PLATELET
Abs Immature Granulocytes: 0.04 K/uL (ref 0.00–0.07)
Basophils Absolute: 0 K/uL (ref 0.0–0.1)
Basophils Relative: 0 %
Eosinophils Absolute: 0 K/uL (ref 0.0–0.5)
Eosinophils Relative: 0 %
HCT: 33.1 % — ABNORMAL LOW (ref 36.0–46.0)
Hemoglobin: 11.5 g/dL — ABNORMAL LOW (ref 12.0–15.0)
Immature Granulocytes: 1 %
Lymphocytes Relative: 20 %
Lymphs Abs: 1.7 K/uL (ref 0.7–4.0)
MCH: 31.2 pg (ref 26.0–34.0)
MCHC: 34.7 g/dL (ref 30.0–36.0)
MCV: 89.7 fL (ref 80.0–100.0)
Monocytes Absolute: 0.8 K/uL (ref 0.1–1.0)
Monocytes Relative: 9 %
Neutro Abs: 5.9 K/uL (ref 1.7–7.7)
Neutrophils Relative %: 70 %
Platelets: 159 K/uL (ref 150–400)
RBC: 3.69 MIL/uL — ABNORMAL LOW (ref 3.87–5.11)
RDW: 11.9 % (ref 11.5–15.5)
WBC: 8.5 K/uL (ref 4.0–10.5)
nRBC: 0 % (ref 0.0–0.2)

## 2024-08-03 LAB — TROPONIN T, HIGH SENSITIVITY
Troponin T High Sensitivity: <15 ng/L (ref 0–19)
Troponin T High Sensitivity: <15 ng/L (ref 0–19)

## 2024-08-03 LAB — BASIC METABOLIC PANEL WITH GFR
Anion gap: 8 (ref 5–15)
BUN: 13 mg/dL (ref 6–20)
CO2: 22 mmol/L (ref 22–32)
Calcium: 8 mg/dL — ABNORMAL LOW (ref 8.9–10.3)
Chloride: 103 mmol/L (ref 98–111)
Creatinine, Ser: 1.18 mg/dL — ABNORMAL HIGH (ref 0.44–1.00)
GFR, Estimated: 53 mL/min — ABNORMAL LOW (ref >60)
Glucose, Bld: 64 mg/dL — ABNORMAL LOW (ref 70–99)
Potassium: 3.3 mmol/L — ABNORMAL LOW (ref 3.5–5.1)
Sodium: 133 mmol/L — ABNORMAL LOW (ref 135–145)

## 2024-08-03 LAB — LACTIC ACID, PLASMA: Lactic Acid, Venous: 1.6 mmol/L (ref 0.5–1.9)

## 2024-08-03 MED ORDER — LEVETIRACETAM (KEPPRA) 500 MG/5 ML ADULT IV PUSH
1000.0000 mg | Freq: Once | INTRAVENOUS | Status: AC
  Administered 2024-08-03: 1000 mg via INTRAVENOUS
  Filled 2024-08-03: qty 10

## 2024-08-03 MED ORDER — SODIUM CHLORIDE 0.9 % IV BOLUS
1000.0000 mL | Freq: Once | INTRAVENOUS | Status: AC
  Administered 2024-08-03: 1000 mL via INTRAVENOUS

## 2024-08-03 NOTE — Telephone Encounter (Signed)
 Patient left VM requesting an ENT referral. Says she discussed her ear problem with Alan at her last visit and Alan looked at her ears. Now she feels like she's in a tin can when she listens to people talking.

## 2024-08-03 NOTE — ED Triage Notes (Signed)
 Patient arrived via [facility] from home after a witnessed syncopal episode. Family stated that the patient passed out for about one minute. The patient also reports dark urine and a burning sensation when urinating  96/60 68 97 230 CBG 17 resp

## 2024-08-03 NOTE — ED Provider Notes (Signed)
 [facility] Provider Note    Event Date/Time   First MD Initiated Contact with Patient 08/03/24 1926     (approximate)   History   Loss of Consciousness and Urinary Tract Infection  Patient arrived via [facility] from home after a witnessed syncopal episode. Family stated that the patient passed out for about one minute. The patient also reports dark urine and a burning sensation when urinating  96/60 68 97 230 CBG 17 resp     HPI Kelsey Russo is a 58 y.o. female PMH COPD, anemia, hypertension, prior syncope, seizure disorder, hyperlipidemia for evaluation after syncopal episode - ***       Physical Exam   Triage Vital Signs: ED Triage Vitals  Encounter Vitals Group     BP 08/03/24 1930 (!) 86/66     Girls Systolic BP Percentile --      Girls Diastolic BP Percentile --      Boys Systolic BP Percentile --      Boys Diastolic BP Percentile --      Pulse Rate 08/03/24 1930 73     Resp 08/03/24 1930 18     Temp 08/03/24 1931 (!) 97.4 F (36.3 C)     Temp src --      SpO2 08/03/24 1929 97 %     Weight --      Height --      Head Circumference --      Peak Flow --      Pain Score --      Pain Loc --      Pain Education --      Exclude from Growth Chart --     Most recent vital signs: Vitals:   08/03/24 1930 08/03/24 1931  BP: (!) 86/66   Pulse: 73   Resp: 18   Temp:  (!) 97.4 F (36.3 C)  SpO2: 100%     {Only need to document appropriate and relevant physical exam:1} General: Awake, no distress. *** CV:  Good peripheral perfusion. RRR, RP 2+*** Resp:  Normal effort. CTAB*** Abd:  No distention. Nontender to deep palpation throughout*** Other:  ***   ED Results / Procedures / Treatments   Labs (all labs ordered are listed, but only abnormal results are displayed) Labs Reviewed  CULTURE, BLOOD (SINGLE)  CULTURE, BLOOD (SINGLE)  CBC WITH DIFFERENTIAL/PLATELET  BASIC METABOLIC PANEL WITH GFR  URINALYSIS, COMPLETE  (UACMP) WITH MICROSCOPIC  LACTIC ACID, PLASMA  TROPONIN T, HIGH SENSITIVITY     EKG  ***   RADIOLOGY *** {USE THE WORD INTERPRETED!! You MUST document your own interpretation of imaging, as well as the fact that you reviewed the radiologist's report!:1}   PROCEDURES:  Critical Care performed: {CriticalCareYesNo:19197::Yes, see critical care procedure note(s),No}  Procedures   MEDICATIONS ORDERED IN ED: Medications  sodium chloride  0.9 % bolus 1,000 mL (has no administration in time range)     IMPRESSION / MDM / ASSESSMENT AND PLAN / ED COURSE  I reviewed the triage vital signs and the nursing notes.                              DDX/MDM/AP: Differential diagnosis includes, but is not limited to, ***  Plan: - ***  Patient's presentation is most consistent with {EM COPA:27473}  ***The patient is on the cardiac monitor to evaluate for evidence of arrhythmia and/or significant heart rate changes.***  ED course below. ***  FINAL CLINICAL IMPRESSION(S) / ED DIAGNOSES   Final diagnoses:  None     Rx / DC Orders   ED Discharge Orders     None        Note:  This document was prepared using Dragon voice recognition software and may include unintentional dictation errors.

## 2024-08-04 ENCOUNTER — Other Ambulatory Visit: Payer: Self-pay

## 2024-08-04 LAB — URINALYSIS, COMPLETE (UACMP) WITH MICROSCOPIC
Bilirubin Urine: NEGATIVE
Glucose, UA: NEGATIVE mg/dL
Hgb urine dipstick: NEGATIVE
Ketones, ur: NEGATIVE mg/dL
Leukocytes,Ua: NEGATIVE
Nitrite: NEGATIVE
Protein, ur: NEGATIVE mg/dL
RBC / HPF: 0 RBC/hpf (ref 0–5)
Specific Gravity, Urine: 1.005 (ref 1.005–1.030)
pH: 7 (ref 5.0–8.0)

## 2024-08-04 LAB — CBG MONITORING, ED: Glucose-Capillary: 79 mg/dL (ref 70–99)

## 2024-08-04 MED ORDER — POTASSIUM CHLORIDE 20 MEQ PO PACK
40.0000 meq | PACK | Freq: Two times a day (BID) | ORAL | Status: DC
  Administered 2024-08-04: 40 meq via ORAL
  Filled 2024-08-04: qty 2

## 2024-08-04 NOTE — Telephone Encounter (Signed)
 Pended the request for the ENT

## 2024-08-04 NOTE — Discharge Instructions (Signed)
 Your evaluation in the emergency department was overall reassuring.  It is possible you had a seizure this evening, but fortunately there was no recurrence of this.  We did see evidence of dehydration, and your blood pressure improved with IV fluids.  Please continue to drink plenty of water daily and take your medications as prescribed.  Please do follow-up with your primary care provider and neurologist for reevaluation, and return to the emergency department with any new or worsening symptoms.

## 2024-08-06 ENCOUNTER — Ambulatory Visit: Payer: MEDICAID | Admitting: Cardiology

## 2024-08-07 ENCOUNTER — Telehealth: Payer: Self-pay | Admitting: Family

## 2024-08-07 ENCOUNTER — Telehealth: Payer: MEDICAID

## 2024-08-07 ENCOUNTER — Inpatient Hospital Stay: Payer: MEDICAID

## 2024-08-07 ENCOUNTER — Inpatient Hospital Stay: Payer: MEDICAID | Admitting: Oncology

## 2024-08-07 DIAGNOSIS — J029 Acute pharyngitis, unspecified: Secondary | ICD-10-CM

## 2024-08-07 NOTE — Telephone Encounter (Signed)
 Patient left VM stating that she was seen in the ED the other day and they did a chest x-ray. She is requesting Alan send her in some cough meds and antibiotics. She tried to schedule an appt with Alan but the soonest available isn't until 12/23. Please advise if you will send her in meds.

## 2024-08-08 LAB — CULTURE, BLOOD (SINGLE): Culture: NO GROWTH

## 2024-08-09 ENCOUNTER — Encounter: Payer: Self-pay | Admitting: Physician Assistant

## 2024-08-09 MED ORDER — AZITHROMYCIN 250 MG PO TABS: ORAL_TABLET | ORAL | 0 refills | Status: AC

## 2024-08-09 MED ORDER — BENZONATATE 100 MG PO CAPS: ORAL_CAPSULE | ORAL | 0 refills | Status: DC

## 2024-08-09 NOTE — Progress Notes (Signed)

## 2024-08-09 NOTE — Addendum Note (Signed)
 Addended by: MAYERS, Berania Peedin S on: 08/09/2024 04:02 PM   Modules accepted: Orders

## 2024-08-10 ENCOUNTER — Encounter: Payer: MEDICAID | Admitting: Internal Medicine

## 2024-08-13 ENCOUNTER — Other Ambulatory Visit: Payer: Self-pay | Admitting: *Deleted

## 2024-08-13 ENCOUNTER — Ambulatory Visit: Payer: MEDICAID | Admitting: Cardiology

## 2024-08-13 ENCOUNTER — Encounter: Payer: Self-pay | Admitting: Cardiology

## 2024-08-13 VITALS — BP 90/56 | HR 81 | Ht 67.0 in | Wt 143.2 lb

## 2024-08-13 DIAGNOSIS — C50411 Malignant neoplasm of upper-outer quadrant of right female breast: Secondary | ICD-10-CM

## 2024-08-13 DIAGNOSIS — Z131 Encounter for screening for diabetes mellitus: Secondary | ICD-10-CM

## 2024-08-13 DIAGNOSIS — J029 Acute pharyngitis, unspecified: Secondary | ICD-10-CM

## 2024-08-13 DIAGNOSIS — J449 Chronic obstructive pulmonary disease, unspecified: Secondary | ICD-10-CM | POA: Diagnosis not present

## 2024-08-13 DIAGNOSIS — J069 Acute upper respiratory infection, unspecified: Secondary | ICD-10-CM | POA: Insufficient documentation

## 2024-08-13 DIAGNOSIS — I10 Essential (primary) hypertension: Secondary | ICD-10-CM

## 2024-08-13 MED ORDER — BENZONATATE 100 MG PO CAPS: ORAL_CAPSULE | ORAL | 0 refills | Status: DC

## 2024-08-13 MED ORDER — METHYLPREDNISOLONE 4 MG PO TBPK: ORAL_TABLET | ORAL | 0 refills | Status: DC

## 2024-08-13 MED ORDER — FLUCONAZOLE 150 MG PO TABS: ORAL_TABLET | ORAL | 0 refills | Status: DC

## 2024-08-13 NOTE — Progress Notes (Unsigned)
 Established Patient Office Visit  Subjective:  Patient ID: Kelsey Russo, female    DOB: Apr 26, 1966  Age: 58 y.o. MRN: [id number]  Chief Complaint  Patient presents with   Follow-up    ER follow up, possible pneumonia, back pain, headache, dizzy, fatigued, right ear pain, lack of appetite.    Patient in office for an acute visit. Patient went to ED on 08/03/2024 with complaints of loss of consciousness and urinary tract infection. Chest xray revealed New infiltrates in the right mid lung suspicious for pneumonia. Follow up in 8 - 10 weeks is recommended to ensure resolution. UA showed few bacteria. Patient discharged with strict return precautions.  Patient seen in telehealth on 08/09/24, diagnosed with strep pharyngitis, treated with a Z-pack.  Patient presents today, has two doses remaining in the Z-pack. Continues to have a productive cough, shortness of breath, runny nose, nasal congestion, ear pain, and sore throat.  Patient has a history of COPD, continues to smoke. Will change Z-pack to Levofloxacin . Will send in a medrol dose pack, refill tessalon  pearls. Diflucan  for yeast infection while on an antibiotic. Drink plenty of water.   Pneumonia She complains of cough, difficulty breathing, shortness of breath and sputum production. This is a new problem. The current episode started 1 to 4 weeks ago. The problem occurs constantly. The problem has been unchanged. The cough is productive of sputum. Associated symptoms include dyspnea on exertion, ear pain, headaches, malaise/fatigue, nasal congestion, rhinorrhea and a sore throat. Pertinent negatives include no chest pain, myalgias or postnasal drip. Her symptoms are aggravated by minimal activity, strenuous activity and lying down. Relieved by: Sudafed, ibuprofen . She reports no improvement on treatment. Risk factors for lung disease include smoking/tobacco exposure. Her past medical history is significant for COPD.    No other concerns  at this time.   Past Medical History:  Diagnosis Date   Anemia    during chemotherapy   Brain tumor (benign) (HCC)    Breast cancer (HCC) 1999   Status post right breast lumpectomy and radiation   Breast cancer (HCC) 11/2019   right breast   COPD (chronic obstructive pulmonary disease) (HCC)    Depression    History of chemotherapy 1999   History of seizure disorder    Hypertension    Personal history of chemotherapy    breast cancer 2021   Personal history of radiation therapy 1999   Pneumonia    PONV (postoperative nausea and vomiting)    vomiting    Past Surgical History:  Procedure Laterality Date   BRAIN SURGERY     Brain tumor resection  1991   BREAST BIOPSY Right 1999   Positive   BREAST BIOPSY Right 11/10/2019   us  bx 10:00 heart marker, positive   BREAST BIOPSY Right 11/10/2019   us  bx axilla, hydromark 4, positive   BREAST BIOPSY Left 11/10/2019   us  bx 2:00, venus marker, neg   MASTECTOMY Right 07/13/2020   positive   PORTACATH PLACEMENT Left 11/30/2019   Procedure: INSERTION PORT-A-CATH;  Surgeon: Dessa Reyes ORN, MD;  Location: [facility] ORS;  Service: General;  Laterality: Left;   Right breast lumpectomy     SIMPLE MASTECTOMY WITH AXILLARY SENTINEL NODE BIOPSY Right 07/13/2020   Procedure: SIMPLE MASTECTOMY WITH AXILLARY SENTINEL NODE BIOPSY;  Surgeon: Dessa Reyes ORN, MD;  Location: [facility] OR[facility];  Service: General;  Laterality: Right;   TONSILLECTOMY     TUBAL LIGATION      Social History   Socioeconomic  History   Marital status: Widowed    Spouse name: Not on file   Number of children: 2   Years of education: Not on file   Highest education level: Associate degree: academic program  Occupational History   Not on file  Tobacco Use   Smoking status: Every Day    Current packs/day: 0.25    Average packs/day: 0.3 packs/day for 40.0 years (10.0 ttl pk-yrs)    Types: Cigarettes   Smokeless tobacco: Never  Vaping Use   Vaping status: Never Used   Substance and Sexual Activity   Alcohol use: No    Alcohol/week: 0.0 standard drinks of alcohol   Drug use: Not Currently    Frequency: 3.0 times per week    Types: Marijuana   Sexual activity: Not on file    Comment: not asked if sexually active  Other Topics Concern   Not on file  Social History Narrative   Lives at home with husband.   Social Drivers of Health   Tobacco Use: High Risk (08/13/2024)   Patient History    Smoking Tobacco Use: Every Day    Smokeless Tobacco Use: Never    Passive Exposure: Not on file  Financial Resource Strain: Low Risk  (06/30/2024)   Received from [facility]   Overall Financial Resource Strain (CARDIA)    Difficulty of Paying Living Expenses: Not hard at all  Food Insecurity: No Food Insecurity (06/30/2024)   Received from [facility]   Epic    Within the past 12 months, you worried that your food would run out before you got the money to buy more.: Never true    Within the past 12 months, the food you bought just didn't last and you didn't have money to get more.: Never true  Transportation Needs: No Transportation Needs (06/30/2024)   Received from [facility] - Transportation    In the past 12 months, has lack of transportation kept you from medical appointments or from getting medications?: No    Lack of Transportation (Non-Medical): No  Physical Activity: Not on file  Stress: Not on file  Social Connections: Socially Isolated (09/28/2023)   Social Connection and Isolation Panel    Frequency of Communication with Friends and Family: More than three times a week    Frequency of Social Gatherings with Friends and Family: Three times a week    Attends Religious Services: Never    Active Member of Clubs or Organizations: No    Attends Banker Meetings: Never    Marital Status: Widowed  Intimate Partner Violence: Not At Risk (09/28/2023)   Humiliation, Afraid,  Rape, and Kick questionnaire    Fear of Current or Ex-Partner: No    Emotionally Abused: No    Physically Abused: No    Sexually Abused: No  Depression (PHQ2-9): High Risk (12/31/2023)   Depression (PHQ2-9)    PHQ-2 Score: 24  Alcohol Screen: Not on file  Housing: Low Risk  (06/30/2024)   Received from [facility]   Epic    In the last 12 months, was there a time when you were not able to pay the mortgage or rent on time?: No    In the past 12 months, how many times have you moved where you were living?: 1    At any time in the past 12 months, were you homeless or living in a shelter (including now)?: No  Utilities:  Not At Risk (06/30/2024)   Received from [facility]   Epic    In the past 12 months has the electric, gas, oil, or water company threatened to shut off services in your home?: No  Health Literacy: Not on file    Family History  Problem Relation Age of Onset   Depression Mother    CAD Father    Hypertension Father    Lung cancer Father    Coronary artery disease Father 50   Schizophrenia Maternal Uncle    Breast cancer Maternal Grandmother    Bipolar disorder Daughter     Allergies[1]  Show/hide medication list[2]  Review of Systems  Constitutional:  Positive for malaise/fatigue.  HENT:  Positive for congestion, ear pain, rhinorrhea and sore throat. Negative for postnasal drip.   Eyes: Negative.   Respiratory:  Positive for cough, sputum production and shortness of breath.   Cardiovascular:  Positive for dyspnea on exertion. Negative for chest pain.  Gastrointestinal: Negative.  Negative for abdominal pain, constipation and diarrhea.  Genitourinary: Negative.   Musculoskeletal:  Negative for joint pain and myalgias.  Skin: Negative.   Neurological:  Positive for headaches. Negative for dizziness.  Endo/Heme/Allergies: Negative.   All other systems reviewed and are negative.      Objective:   BP (!) 90/56   Pulse  81   Ht 5' 7 (1.702 m)   Wt 143 lb 3.2 oz (65 kg)   SpO2 (!) 89%   BMI 22.43 kg/m   Vitals:   08/13/24 1438  BP: (!) 90/56  Pulse: 81  Height: 5' 7 (1.702 m)  Weight: 143 lb 3.2 oz (65 kg)  SpO2: (!) 89%  BMI (Calculated): 22.42    Physical Exam Vitals and nursing note reviewed.  Constitutional:      Appearance: Normal appearance. She is normal weight.  HENT:     Head: Normocephalic and atraumatic.     Nose: Nose normal.     Mouth/Throat:     Mouth: Mucous membranes are moist.  Eyes:     Extraocular Movements: Extraocular movements intact.     Conjunctiva/sclera: Conjunctivae normal.     Pupils: Pupils are equal, round, and reactive to light.  Cardiovascular:     Rate and Rhythm: Normal rate and regular rhythm.     Pulses: Normal pulses.     Heart sounds: Normal heart sounds.  Pulmonary:     Effort: Pulmonary effort is normal.     Breath sounds: Normal breath sounds.  Abdominal:     General: Abdomen is flat. Bowel sounds are normal.     Palpations: Abdomen is soft.  Musculoskeletal:        General: Normal range of motion.     Cervical back: Normal range of motion.  Skin:    General: Skin is warm and dry.  Neurological:     General: No focal deficit present.     Mental Status: She is alert and oriented to person, place, and time.  Psychiatric:        Mood and Affect: Mood normal.        Behavior: Behavior normal.        Thought Content: Thought content normal.        Judgment: Judgment normal.      No results found for any visits on 08/13/24.  Recent Results (from the past 2160 hours)  CBC With Diff/Platelet     Status: Abnormal   Collection Time: 06/05/24 12:13 PM  Result Value Ref Range   WBC 10.4 3.4 - 10.8 x10E3/uL   RBC 4.83 3.77 - 5.28 x10E6/uL   Hemoglobin 14.7 11.1 - 15.9 g/dL   Hematocrit 55.4 65.9 - 46.6 %   MCV 92 79 - 97 fL   MCH 30.4 26.6 - 33.0 pg   MCHC 33.0 31.5 - 35.7 g/dL   RDW 85.1 88.2 - 84.5 %   Platelets 202 150 - 450  x10E3/uL   Neutrophils 72 Not Estab. %   Lymphs 20 Not Estab. %   Monocytes 6 Not Estab. %   Eos 1 Not Estab. %   Basos 1 Not Estab. %   Neutrophils Absolute 7.5 (H) 1.4 - 7.0 x10E3/uL   Lymphocytes Absolute 2.0 0.7 - 3.1 x10E3/uL   Monocytes Absolute 0.6 0.1 - 0.9 x10E3/uL   EOS (ABSOLUTE) 0.1 0.0 - 0.4 x10E3/uL   Basophils Absolute 0.1 0.0 - 0.2 x10E3/uL   Immature Granulocytes 0 Not Estab. %   Immature Grans (Abs) 0.0 0.0 - 0.1 x10E3/uL  TSH     Status: None   Collection Time: 06/05/24 12:13 PM  Result Value Ref Range   TSH 1.890 0.450 - 4.500 uIU/mL  Hemoglobin A1c     Status: None   Collection Time: 06/05/24 12:13 PM  Result Value Ref Range   Hgb A1c MFr Bld 5.1 4.8 - 5.6 %    Comment:          Prediabetes: 5.7 - 6.4          Diabetes: >6.4          Glycemic control for adults with diabetes: <7.0    Est. average glucose Bld gHb Est-mCnc 100 mg/dL  Lipid panel     Status: Abnormal   Collection Time: 06/05/24 12:13 PM  Result Value Ref Range   Cholesterol, Total 191 100 - 199 mg/dL   Triglycerides 71 0 - 149 mg/dL   HDL 77 >60 mg/dL   VLDL Cholesterol Cal 13 5 - 40 mg/dL   LDL Chol Calc (NIH) 898 (H) 0 - 99 mg/dL   Chol/HDL Ratio 2.5 0.0 - 4.4 ratio    Comment:                                   T. Chol/HDL Ratio                                             Men  Women                               1/2 Avg.Risk  3.4    3.3                                   Avg.Risk  5.0    4.4                                2X Avg.Risk  9.6    7.1  3X Avg.Risk 23.4   11.0   CMP14+EGFR     Status: None   Collection Time: 06/05/24 12:13 PM  Result Value Ref Range   Glucose 99 70 - 99 mg/dL   BUN 14 6 - 24 mg/dL   Creatinine, Ser 8.99 0.57 - 1.00 mg/dL   eGFR 65 >40 fO/fpw/8.26   BUN/Creatinine Ratio 14 9 - 23   Sodium 138 134 - 144 mmol/L   Potassium 3.6 3.5 - 5.2 mmol/L   Chloride 102 96 - 106 mmol/L   CO2 20 20 - 29 mmol/L   Calcium 9.6 8.7 - 10.2  mg/dL   Total Protein 7.0 6.0 - 8.5 g/dL   Albumin 4.5 3.8 - 4.9 g/dL   Globulin, Total 2.5 1.5 - 4.5 g/dL   Bilirubin Total 0.6 0.0 - 1.2 mg/dL   Alkaline Phosphatase 79 49 - 135 IU/L   AST 27 0 - 40 IU/L   ALT 13 0 - 32 IU/L  CBC with Differential     Status: Abnormal   Collection Time: 08/03/24  7:46 PM  Result Value Ref Range   WBC 8.5 4.0 - 10.5 K/uL   RBC 3.69 (L) 3.87 - 5.11 MIL/uL   Hemoglobin 11.5 (L) 12.0 - 15.0 g/dL   HCT 66.8 (L) 63.9 - 53.9 %   MCV 89.7 80.0 - 100.0 fL   MCH 31.2 26.0 - 34.0 pg   MCHC 34.7 30.0 - 36.0 g/dL   RDW 88.0 88.4 - 84.4 %   Platelets 159 150 - 400 K/uL   nRBC 0.0 0.0 - 0.2 %   Neutrophils Relative % 70 %   Neutro Abs 5.9 1.7 - 7.7 K/uL   Lymphocytes Relative 20 %   Lymphs Abs 1.7 0.7 - 4.0 K/uL   Monocytes Relative 9 %   Monocytes Absolute 0.8 0.1 - 1.0 K/uL   Eosinophils Relative 0 %   Eosinophils Absolute 0.0 0.0 - 0.5 K/uL   Basophils Relative 0 %   Basophils Absolute 0.0 0.0 - 0.1 K/uL   Immature Granulocytes 1 %   Abs Immature Granulocytes 0.04 0.00 - 0.07 K/uL    Comment: Performed at [facility], [address] [place], [place] [address]  Basic metabolic panel     Status: Abnormal   Collection Time: 08/03/24  7:46 PM  Result Value Ref Range   Sodium 133 (L) 135 - 145 mmol/L   Potassium 3.3 (L) 3.5 - 5.1 mmol/L   Chloride 103 98 - 111 mmol/L   CO2 22 22 - 32 mmol/L   Glucose, Bld 64 (L) 70 - 99 mg/dL    Comment: Glucose reference range applies only to samples taken after fasting for at least 8 hours.   BUN 13 6 - 20 mg/dL   Creatinine, Ser 8.81 (H) 0.44 - 1.00 mg/dL   Calcium 8.0 (L) 8.9 - 10.3 mg/dL   GFR, Estimated 53 (L) >60 mL/min    Comment: (NOTE) Calculated using the CKD-EPI Creatinine Equation (2021)    Anion gap 8 5 - 15    Comment: Performed at [facility], [address] [place], [place] [address]  Troponin T, High Sensitivity     Status: None   Collection Time: 08/03/24  7:46 PM   Result Value Ref Range   Troponin T High Sensitivity <15 0 - 19 ng/L    Comment: (NOTE) Biotin concentrations > 1000 ng/mL falsely decrease TnT results.  Serial cardiac troponin measurements are suggested.  Refer to the  Links section for chest pain algorithms and additional  guidance. Performed at [facility], [address] [place], [place] [address]   Lactic acid, plasma     Status: None   Collection Time: 08/03/24  7:46 PM  Result Value Ref Range   Lactic Acid, Venous 1.6 0.5 - 1.9 mmol/L    Comment: Performed at [facility], [address] [place], [place] [address]  Blood culture (single)     Status: None   Collection Time: 08/03/24  7:46 PM   Specimen: BLOOD  Result Value Ref Range   Specimen Description BLOOD BLOOD LEFT ARM    Special Requests      BOTTLES DRAWN AEROBIC ONLY Blood Culture results may not be optimal due to an inadequate volume of blood received in culture bottles   Culture      NO GROWTH 5 DAYS Performed at [facility], [address], [place], [place] [address]    Report Status 08/08/2024 FINAL   CBG monitoring, ED     Status: None   Collection Time: 08/03/24  9:24 PM  Result Value Ref Range   Glucose-Capillary 79 70 - 99 mg/dL    Comment: Glucose reference range applies only to samples taken after fasting for at least 8 hours.  Troponin T, High Sensitivity     Status: None   Collection Time: 08/03/24 10:10 PM  Result Value Ref Range   Troponin T High Sensitivity <15 0 - 19 ng/L    Comment: (NOTE) Biotin concentrations > 1000 ng/mL falsely decrease TnT results.  Serial cardiac troponin measurements are suggested.  Refer to the Links section for chest pain algorithms and additional  guidance. Performed at [facility], [address] [place], [place] [address]   Urinalysis, Complete w Microscopic -Urine, Clean Catch     Status: Abnormal   Collection Time: 08/03/24 11:30 PM  Result Value Ref Range    Color, Urine STRAW (A) YELLOW   APPearance CLEAR (A) CLEAR   Specific Gravity, Urine 1.005 1.005 - 1.030   pH 7.0 5.0 - 8.0   Glucose, UA NEGATIVE NEGATIVE mg/dL   Hgb urine dipstick NEGATIVE NEGATIVE   Bilirubin Urine NEGATIVE NEGATIVE   Ketones, ur NEGATIVE NEGATIVE mg/dL   Protein, ur NEGATIVE NEGATIVE mg/dL   Nitrite NEGATIVE NEGATIVE   Leukocytes,Ua NEGATIVE NEGATIVE   RBC / HPF 0 0 - 5 RBC/hpf   WBC, UA 0-5 0 - 5 WBC/hpf   Bacteria, UA FEW (A) NONE SEEN   Squamous Epithelial / HPF 0-5 0 - 5 /HPF    Comment: Performed at [facility], [address] [place], [place] [address]      Assessment & Plan:  Levofloxacin  Medrol dose pack Tessalon  pearls Diflucan  Drink plenty of water  Problem List Items Addressed This Visit       Cardiovascular and Mediastinum   Essential hypertension     Respiratory   Chronic obstructive pulmonary disease, unspecified COPD type (HCC)   Relevant Medications   benzonatate  (TESSALON ) 100 MG capsule   methylPREDNISolone (MEDROL DOSEPAK) 4 MG TBPK tablet   Upper respiratory tract infection - Primary   Relevant Medications   benzonatate  (TESSALON ) 100 MG capsule   fluconazole  (DIFLUCAN ) 150 MG tablet    Return if symptoms worsen or fail to improve, for as scheduled with Alan .   Total time spent: 25 minutes. This time includes review of previous notes and results and patient face to face interaction during today's visit.  Jeoffrey Pollen, NP  08/13/2024   This document may have been prepared by Dragon Voice Recognition software and as such may include unintentional dictation errors.      [1]  Allergies Allergen Reactions   Imitrex [Sumatriptan] Anaphylaxis   Penicillins Rash    Tolerated augmentin  in 2021   Amoxicillin    [2]  Outpatient Medications Prior to Visit  Medication Sig   albuterol  (VENTOLIN  HFA) 108 (90 Base) MCG/ACT inhaler Inhale 1-2 puffs into the lungs every 6 (six) hours as needed for wheezing or  shortness of breath.   alendronate  (FOSAMAX ) 70 MG tablet TAKE ONE TABLET (70 MG TOTAL) BY MOUTH ONCE A WEEK. TAKE WITH A FULL GLASS OF WATER ON AN EMPTY STOMACH.   azelastine  (ASTELIN ) 0.1 % nasal spray Place 1 spray into both nostrils 2 (two) times daily.   azithromycin  (ZITHROMAX ) 250 MG tablet Take 2 tablets on day 1, then 1 tablet daily on days 2 through 5   clonazePAM  (KLONOPIN ) 1 MG tablet Take 1 tablet (1 mg total) by mouth 3 (three) times daily as needed for anxiety.   cyclobenzaprine  (FLEXERIL ) 10 MG tablet Take 1 tablet (10 mg total) by mouth 2 (two) times daily.   gabapentin  (NEURONTIN ) 300 MG capsule TAKE ONE CAPSULE (300 MG TOTAL) BY MOUTH TWO TIMES DAILY.   tamoxifen  (NOLVADEX ) 20 MG tablet TAKE ONE TABLET BY MOUTH ONCE A DAY   traMADol  (ULTRAM ) 50 MG tablet Take 1 tablet (50 mg total) by mouth every 6 (six) hours as needed for up to 20 days.   traZODone  (DESYREL ) 50 MG tablet TAKE ONE TO TWO TABLETS BY MOUTH EACH NIGHT AT BEDTIME AS NEEDED FOR INSOMNIA   triamcinolone  cream (KENALOG ) 0.1 % Apply 1 Application topically 2 (two) times daily.   varenicline  (CHANTIX ) 1 MG tablet TAKE ONE TABLET (1 MG TOTAL) BY MOUTH TWO TIMES DAILY.   [DISCONTINUED] benzonatate  (TESSALON ) 100 MG capsule Take 1-2 caps PO TID PRN   [DISCONTINUED] hydrOXYzine  (ATARAX ) 25 MG tablet TAKE ONE TABLET (25 MG TOTAL) BY MOUTH THREE TIMES DAILY.   [DISCONTINUED] methocarbamol  (ROBAXIN ) 500 MG tablet TAKE ONE TABLET (500 MG TOTAL) BY MOUTH EVERY SIX HOURS AS NEEDED FOR MUSCLE SPASMS.   [DISCONTINUED] sertraline  (ZOLOFT ) 100 MG tablet Take 2 tablets (200 mg total) by mouth daily.   No facility-administered medications prior to visit.

## 2024-08-14 ENCOUNTER — Inpatient Hospital Stay: Payer: MEDICAID | Admitting: Oncology

## 2024-08-14 ENCOUNTER — Telehealth: Payer: Self-pay | Admitting: Oncology

## 2024-08-14 ENCOUNTER — Inpatient Hospital Stay: Payer: MEDICAID

## 2024-08-14 NOTE — Telephone Encounter (Signed)
 Patient called to reschedule appointment due to lack of transportation.  Appointments rescheduled as requested

## 2024-08-17 ENCOUNTER — Other Ambulatory Visit: Payer: Self-pay | Admitting: Family

## 2024-08-18 ENCOUNTER — Inpatient Hospital Stay: Payer: MEDICAID | Admitting: Oncology

## 2024-08-18 ENCOUNTER — Inpatient Hospital Stay: Payer: MEDICAID

## 2024-08-24 ENCOUNTER — Other Ambulatory Visit: Payer: Self-pay | Admitting: Neurology

## 2024-08-24 ENCOUNTER — Other Ambulatory Visit: Payer: Self-pay

## 2024-08-24 DIAGNOSIS — R413 Other amnesia: Secondary | ICD-10-CM

## 2024-08-24 DIAGNOSIS — Z87898 Personal history of other specified conditions: Secondary | ICD-10-CM

## 2024-08-24 DIAGNOSIS — G8929 Other chronic pain: Secondary | ICD-10-CM

## 2024-08-24 DIAGNOSIS — R569 Unspecified convulsions: Secondary | ICD-10-CM

## 2024-08-26 ENCOUNTER — Other Ambulatory Visit: Payer: Self-pay | Admitting: Family

## 2024-08-28 ENCOUNTER — Other Ambulatory Visit: Payer: Self-pay

## 2024-08-28 ENCOUNTER — Encounter: Payer: Self-pay | Admitting: Family

## 2024-08-31 ENCOUNTER — Other Ambulatory Visit: Payer: Self-pay

## 2024-08-31 MED ORDER — ONDANSETRON 4 MG PO TBDP: 4.0000 mg | ORAL_TABLET | Freq: Three times a day (TID) | ORAL | 0 refills | Status: DC | PRN

## 2024-09-01 ENCOUNTER — Ambulatory Visit: Payer: MEDICAID

## 2024-09-06 ENCOUNTER — Ambulatory Visit: Admission: RE | Admit: 2024-09-06 | Payer: MEDICAID | Source: Ambulatory Visit

## 2024-09-07 ENCOUNTER — Encounter: Payer: Self-pay | Admitting: Oncology

## 2024-09-07 ENCOUNTER — Inpatient Hospital Stay: Payer: MEDICAID

## 2024-09-07 ENCOUNTER — Ambulatory Visit: Payer: MEDICAID | Admitting: Family

## 2024-09-07 ENCOUNTER — Inpatient Hospital Stay: Payer: MEDICAID | Admitting: Oncology

## 2024-09-08 ENCOUNTER — Ambulatory Visit: Payer: MEDICAID | Admitting: Neurosurgery

## 2024-09-08 ENCOUNTER — Encounter: Payer: MEDICAID | Admitting: Internal Medicine

## 2024-09-08 ENCOUNTER — Telehealth: Payer: Self-pay

## 2024-09-08 ENCOUNTER — Telehealth: Payer: Self-pay | Admitting: Oncology

## 2024-09-08 NOTE — Telephone Encounter (Signed)
 Patient was supposed to see you for PM today for her initial visit she called the answering service and asked to r/s the appt, this would be the second one that's been changed do you want me to r/s her? Or no?

## 2024-09-08 NOTE — Telephone Encounter (Signed)
 Noted I will inform Alan

## 2024-09-08 NOTE — Telephone Encounter (Signed)
 Pt called to r/s missed appt - r/s w/pt - pt confirmed new date/time - LH

## 2024-09-14 ENCOUNTER — Other Ambulatory Visit: Payer: Self-pay

## 2024-09-14 ENCOUNTER — Inpatient Hospital Stay
Admission: EM | Admit: 2024-09-14 | Discharge: 2024-09-15 | DRG: 071 | Discharge status: Against Medical Advice | Payer: MEDICAID | Attending: Internal Medicine | Admitting: Internal Medicine

## 2024-09-14 ENCOUNTER — Emergency Department: Payer: MEDICAID

## 2024-09-14 ENCOUNTER — Observation Stay: Payer: MEDICAID

## 2024-09-14 DIAGNOSIS — Z79899 Other long term (current) drug therapy: Secondary | ICD-10-CM

## 2024-09-14 DIAGNOSIS — G9340 Encephalopathy, unspecified: Secondary | ICD-10-CM | POA: Diagnosis not present

## 2024-09-14 DIAGNOSIS — Z9189 Other specified personal risk factors, not elsewhere classified: Secondary | ICD-10-CM

## 2024-09-14 DIAGNOSIS — E876 Hypokalemia: Secondary | ICD-10-CM | POA: Diagnosis present

## 2024-09-14 DIAGNOSIS — E162 Hypoglycemia, unspecified: Secondary | ICD-10-CM | POA: Diagnosis present

## 2024-09-14 DIAGNOSIS — Z91128 Patient's intentional underdosing of medication regimen for other reason: Secondary | ICD-10-CM

## 2024-09-14 DIAGNOSIS — Z9889 Other specified postprocedural states: Secondary | ICD-10-CM

## 2024-09-14 DIAGNOSIS — G894 Chronic pain syndrome: Secondary | ICD-10-CM | POA: Diagnosis present

## 2024-09-14 DIAGNOSIS — Z79810 Long term (current) use of selective estrogen receptor modulators (SERMs): Secondary | ICD-10-CM

## 2024-09-14 DIAGNOSIS — Z7983 Long term (current) use of bisphosphonates: Secondary | ICD-10-CM

## 2024-09-14 DIAGNOSIS — F418 Other specified anxiety disorders: Secondary | ICD-10-CM | POA: Diagnosis present

## 2024-09-14 DIAGNOSIS — Z8701 Personal history of pneumonia (recurrent): Secondary | ICD-10-CM

## 2024-09-14 DIAGNOSIS — N179 Acute kidney failure, unspecified: Secondary | ICD-10-CM | POA: Diagnosis present

## 2024-09-14 DIAGNOSIS — F419 Anxiety disorder, unspecified: Secondary | ICD-10-CM | POA: Diagnosis present

## 2024-09-14 DIAGNOSIS — E860 Dehydration: Secondary | ICD-10-CM | POA: Diagnosis present

## 2024-09-14 DIAGNOSIS — R270 Ataxia, unspecified: Secondary | ICD-10-CM | POA: Diagnosis present

## 2024-09-14 DIAGNOSIS — Z5329 Procedure and treatment not carried out because of patient's decision for other reasons: Secondary | ICD-10-CM | POA: Diagnosis not present

## 2024-09-14 DIAGNOSIS — F17210 Nicotine dependence, cigarettes, uncomplicated: Secondary | ICD-10-CM | POA: Diagnosis present

## 2024-09-14 DIAGNOSIS — Z9011 Acquired absence of right breast and nipple: Secondary | ICD-10-CM

## 2024-09-14 DIAGNOSIS — Z880 Allergy status to penicillin: Secondary | ICD-10-CM

## 2024-09-14 DIAGNOSIS — Z888 Allergy status to other drugs, medicaments and biological substances status: Secondary | ICD-10-CM

## 2024-09-14 DIAGNOSIS — R39198 Other difficulties with micturition: Secondary | ICD-10-CM | POA: Diagnosis present

## 2024-09-14 DIAGNOSIS — T426X6A Underdosing of other antiepileptic and sedative-hypnotic drugs, initial encounter: Secondary | ICD-10-CM | POA: Diagnosis present

## 2024-09-14 DIAGNOSIS — Z9851 Tubal ligation status: Secondary | ICD-10-CM

## 2024-09-14 DIAGNOSIS — Z9089 Acquired absence of other organs: Secondary | ICD-10-CM

## 2024-09-14 DIAGNOSIS — Z9221 Personal history of antineoplastic chemotherapy: Secondary | ICD-10-CM

## 2024-09-14 DIAGNOSIS — T50915A Adverse effect of multiple unspecified drugs, medicaments and biological substances, initial encounter: Secondary | ICD-10-CM | POA: Diagnosis present

## 2024-09-14 DIAGNOSIS — Z801 Family history of malignant neoplasm of trachea, bronchus and lung: Secondary | ICD-10-CM

## 2024-09-14 DIAGNOSIS — H532 Diplopia: Secondary | ICD-10-CM | POA: Diagnosis present

## 2024-09-14 DIAGNOSIS — Z86011 Personal history of benign neoplasm of the brain: Secondary | ICD-10-CM

## 2024-09-14 DIAGNOSIS — N3000 Acute cystitis without hematuria: Principal | ICD-10-CM

## 2024-09-14 DIAGNOSIS — F32A Depression, unspecified: Secondary | ICD-10-CM | POA: Diagnosis present

## 2024-09-14 DIAGNOSIS — Z9181 History of falling: Secondary | ICD-10-CM

## 2024-09-14 DIAGNOSIS — Z923 Personal history of irradiation: Secondary | ICD-10-CM

## 2024-09-14 DIAGNOSIS — Z853 Personal history of malignant neoplasm of breast: Secondary | ICD-10-CM

## 2024-09-14 DIAGNOSIS — E871 Hypo-osmolality and hyponatremia: Secondary | ICD-10-CM | POA: Diagnosis present

## 2024-09-14 DIAGNOSIS — Z87892 Personal history of anaphylaxis: Secondary | ICD-10-CM

## 2024-09-14 DIAGNOSIS — C50411 Malignant neoplasm of upper-outer quadrant of right female breast: Secondary | ICD-10-CM | POA: Diagnosis present

## 2024-09-14 DIAGNOSIS — W19XXXA Unspecified fall, initial encounter: Secondary | ICD-10-CM | POA: Diagnosis present

## 2024-09-14 DIAGNOSIS — J449 Chronic obstructive pulmonary disease, unspecified: Secondary | ICD-10-CM | POA: Diagnosis present

## 2024-09-14 DIAGNOSIS — R519 Headache, unspecified: Secondary | ICD-10-CM | POA: Diagnosis present

## 2024-09-14 DIAGNOSIS — Z803 Family history of malignant neoplasm of breast: Secondary | ICD-10-CM

## 2024-09-14 DIAGNOSIS — G9341 Metabolic encephalopathy: Principal | ICD-10-CM | POA: Diagnosis present

## 2024-09-14 DIAGNOSIS — I10 Essential (primary) hypertension: Secondary | ICD-10-CM | POA: Diagnosis present

## 2024-09-14 DIAGNOSIS — Z8249 Family history of ischemic heart disease and other diseases of the circulatory system: Secondary | ICD-10-CM

## 2024-09-14 DIAGNOSIS — E872 Acidosis, unspecified: Secondary | ICD-10-CM | POA: Diagnosis present

## 2024-09-14 DIAGNOSIS — G40909 Epilepsy, unspecified, not intractable, without status epilepticus: Secondary | ICD-10-CM | POA: Diagnosis present

## 2024-09-14 DIAGNOSIS — Z85118 Personal history of other malignant neoplasm of bronchus and lung: Secondary | ICD-10-CM

## 2024-09-14 DIAGNOSIS — Z818 Family history of other mental and behavioral disorders: Secondary | ICD-10-CM

## 2024-09-14 DIAGNOSIS — R296 Repeated falls: Secondary | ICD-10-CM | POA: Diagnosis present

## 2024-09-14 LAB — URINALYSIS, ROUTINE W REFLEX MICROSCOPIC
Bilirubin Urine: NEGATIVE
Glucose, UA: NEGATIVE mg/dL
Hgb urine dipstick: NEGATIVE
Ketones, ur: NEGATIVE mg/dL
Nitrite: NEGATIVE
Protein, ur: NEGATIVE mg/dL
Specific Gravity, Urine: 1.005 (ref 1.005–1.030)
pH: 6 (ref 5.0–8.0)

## 2024-09-14 LAB — BASIC METABOLIC PANEL WITH GFR
Anion gap: 15 (ref 5–15)
BUN: 12 mg/dL (ref 6–20)
CO2: 19 mmol/L — ABNORMAL LOW (ref 22–32)
Calcium: 9.9 mg/dL (ref 8.9–10.3)
Chloride: 102 mmol/L (ref 98–111)
Creatinine, Ser: 1.22 mg/dL — ABNORMAL HIGH (ref 0.44–1.00)
GFR, Estimated: 51 mL/min — ABNORMAL LOW
Glucose, Bld: 121 mg/dL — ABNORMAL HIGH (ref 70–99)
Potassium: 3.9 mmol/L (ref 3.5–5.1)
Sodium: 136 mmol/L (ref 135–145)

## 2024-09-14 LAB — CBC
HCT: 41.1 % (ref 36.0–46.0)
Hemoglobin: 13.9 g/dL (ref 12.0–15.0)
MCH: 30.5 pg (ref 26.0–34.0)
MCHC: 33.8 g/dL (ref 30.0–36.0)
MCV: 90.3 fL (ref 80.0–100.0)
Platelets: 216 K/uL (ref 150–400)
RBC: 4.55 MIL/uL (ref 3.87–5.11)
RDW: 11.9 % (ref 11.5–15.5)
WBC: 6.8 K/uL (ref 4.0–10.5)
nRBC: 0 % (ref 0.0–0.2)

## 2024-09-14 MED ORDER — ENOXAPARIN SODIUM 40 MG/0.4ML IJ SOSY
40.0000 mg | PREFILLED_SYRINGE | INTRAMUSCULAR | Status: DC
  Administered 2024-09-14: 40 mg via SUBCUTANEOUS
  Filled 2024-09-14: qty 0.4

## 2024-09-14 MED ORDER — ACETAMINOPHEN 325 MG RE SUPP: 650.0000 mg | Freq: Four times a day (QID) | RECTAL | Status: DC | PRN

## 2024-09-14 MED ORDER — SENNOSIDES-DOCUSATE SODIUM 8.6-50 MG PO TABS: 1.0000 | ORAL_TABLET | Freq: Every evening | ORAL | Status: DC | PRN

## 2024-09-14 MED ORDER — NICOTINE 7 MG/24HR TD PT24
7.0000 mg | MEDICATED_PATCH | Freq: Every day | TRANSDERMAL | Status: DC
  Filled 2024-09-14 (×2): qty 1

## 2024-09-14 MED ORDER — OXYCODONE-ACETAMINOPHEN 5-325 MG PO TABS
1.0000 | ORAL_TABLET | Freq: Once | ORAL | Status: AC
  Administered 2024-09-14: 1 via ORAL
  Filled 2024-09-14: qty 1

## 2024-09-14 MED ORDER — QUETIAPINE FUMARATE 25 MG PO TABS: 25.0000 mg | ORAL_TABLET | Freq: Once | ORAL | Status: DC

## 2024-09-14 MED ORDER — TAMOXIFEN CITRATE 10 MG PO TABS
20.0000 mg | ORAL_TABLET | Freq: Every day | ORAL | Status: DC
  Administered 2024-09-15: 20 mg via ORAL
  Filled 2024-09-14: qty 2

## 2024-09-14 MED ORDER — ACETAMINOPHEN 325 MG PO TABS
650.0000 mg | ORAL_TABLET | Freq: Four times a day (QID) | ORAL | Status: DC | PRN
  Filled 2024-09-14: qty 2

## 2024-09-14 MED ORDER — ALBUTEROL SULFATE HFA 108 (90 BASE) MCG/ACT IN AERS: 1.0000 | INHALATION_SPRAY | Freq: Four times a day (QID) | RESPIRATORY_TRACT | Status: DC | PRN

## 2024-09-14 MED ORDER — CLONAZEPAM 0.5 MG PO TABS
0.5000 mg | ORAL_TABLET | Freq: Two times a day (BID) | ORAL | Status: DC
  Administered 2024-09-14 – 2024-09-15 (×2): 0.5 mg via ORAL
  Filled 2024-09-14 (×2): qty 1

## 2024-09-14 MED ORDER — GADOBUTROL 1 MMOL/ML IV SOLN
6.0000 mL | Freq: Once | INTRAVENOUS | Status: AC | PRN
  Administered 2024-09-14: 6 mL via INTRAVENOUS

## 2024-09-14 MED ORDER — SODIUM CHLORIDE 0.9 % IV SOLN
2.0000 g | Freq: Once | INTRAVENOUS | Status: AC
  Administered 2024-09-14: 2 g via INTRAVENOUS
  Filled 2024-09-14: qty 20

## 2024-09-14 MED ORDER — PROMETHAZINE HCL 25 MG PO TABS: 12.5000 mg | ORAL_TABLET | Freq: Four times a day (QID) | ORAL | Status: DC | PRN

## 2024-09-14 MED ORDER — LORAZEPAM 2 MG/ML IJ SOLN: 0.5000 mg | Freq: Four times a day (QID) | INTRAMUSCULAR | Status: DC | PRN

## 2024-09-14 MED ORDER — GABAPENTIN 100 MG PO CAPS
200.0000 mg | ORAL_CAPSULE | Freq: Two times a day (BID) | ORAL | Status: DC
  Administered 2024-09-14 – 2024-09-15 (×2): 200 mg via ORAL
  Filled 2024-09-14 (×2): qty 2

## 2024-09-14 MED ORDER — TRAZODONE HCL 50 MG PO TABS
50.0000 mg | ORAL_TABLET | Freq: Every evening | ORAL | Status: DC | PRN
  Administered 2024-09-14: 50 mg via ORAL
  Filled 2024-09-14: qty 1

## 2024-09-14 MED ORDER — SODIUM CHLORIDE 0.9 % IV BOLUS
1000.0000 mL | Freq: Once | INTRAVENOUS | Status: AC
  Administered 2024-09-14: 1000 mL via INTRAVENOUS

## 2024-09-14 NOTE — ED Provider Notes (Signed)
 "  [facility] Provider Note    Event Date/Time   First MD Initiated Contact with Patient 09/14/24 1120     (approximate)   History   No chief complaint on file.   HPI  Kelsey Russo is a 59 y.o. female with history of brain tumor presenting today with concern of altered mental status.  Apparently over the last 4 days has had a significant cognitive decline.  She lives with her son, seems that over the last week has been gradually declining, but over this weekend has had multiple falls and has been acting very bizarrely.  Generally is with that and able to answer questions appropriately, but daughter states that she was noted increased confusion where patient frequently rambles and does not answer questions very well.  Patient also has been complaining of dizziness and lightheadedness, unclear if she is actually syncopized or not.  She does complain of a right-sided headache along the area where she recently fell, denies any other complaints of pain anywhere but she states that she overall does not feel very well.  She has been having some problems with urinating described as burning.  No noted fevers.  She complains of some diplopia when asked but she states that this is been going on for many months to years now and has not acutely changed.     Physical Exam   Triage Vital Signs: ED Triage Vitals [09/14/24 1037]  Encounter Vitals Group     BP 121/84     Girls Systolic BP Percentile      Girls Diastolic BP Percentile      Boys Systolic BP Percentile      Boys Diastolic BP Percentile      Pulse Rate (!) 122     Resp 20     Temp      Temp src      SpO2 95 %     Weight 138 lb (62.6 kg)     Height 5' 5 (1.651 m)     Head Circumference      Peak Flow      Pain Score 6     Pain Loc      Pain Education      Exclude from Growth Chart     Most recent vital signs: Vitals:   09/14/24 1214 09/14/24 1342  BP: 92/72 103/71  Pulse: (!) 103 85  Resp: 18 18   SpO2: 95% 94%     General: Awake, no distress.  CV:  Good peripheral perfusion.  Resp:  Normal effort.  Abd:  No distention.  Soft nontender MSK:  No focal midline cervical thoracic or lumbar spinal tenderness Neuro:  Alert and oriented x 3, cranial nerves II to XII are intact, I do not appreciate any word finding difficulty or noted slurring of speech, appropriate sensation strength and coordination in all extremities.  When asking questions, the patient does tend to ramble and often times is not tangential Other:     ED Results / Procedures / Treatments   Labs (all labs ordered are listed, but only abnormal results are displayed) Labs Reviewed  BASIC METABOLIC PANEL WITH GFR - Abnormal; Notable for the following components:      Result Value   CO2 19 (*)    Glucose, Bld 121 (*)    Creatinine, Ser 1.22 (*)    GFR, Estimated 51 (*)    All other components within normal limits  URINALYSIS, ROUTINE W REFLEX MICROSCOPIC -  Abnormal; Notable for the following components:   Color, Urine YELLOW (*)    APPearance HAZY (*)    Leukocytes,Ua TRACE (*)    Bacteria, UA MANY (*)    Non Squamous Epithelial PRESENT (*)    All other components within normal limits  CBC     EKG  On my independent interpretation of this EKG, appears to be a sinus rhythm with rate of about 120, axis is about 45, intervals appear to be within normal limits, no obvious ischemia.   RADIOLOGY   PROCEDURES:  Critical Care performed: No  Procedures   MEDICATIONS ORDERED IN ED: Medications  cefTRIAXone  (ROCEPHIN ) 2 g in sodium chloride  0.9 % 100 mL IVPB (2 g Intravenous New Bag/Given 09/14/24 1523)  sodium chloride  0.9 % bolus 1,000 mL (0 mLs Intravenous Stopped 09/14/24 1436)  oxyCODONE -acetaminophen  (PERCOCET/ROXICET) 5-325 MG per tablet 1 tablet (1 tablet Oral Given 09/14/24 1313)     IMPRESSION / MDM / ASSESSMENT AND PLAN / ED COURSE  I reviewed the triage vital signs and the nursing notes.                                Patient's presentation is most consistent with acute complicated illness / injury requiring diagnostic workup.  59 year old female who presents today with concern of altered mental status.  She does appear to be confused, having trouble answering questions appropriately and recalling recent events.  She is alert and oriented and she does not have any acute neurodeficits on my assessment.  Symptoms seem to have been a gradual onset over the last week.  Her vitals here do demonstrate a significant tachycardia but otherwise remained stable and appropriate.  CT imaging of the head and neck have been obtained here, we are awaiting urinalysis as I do suspect likely urinary tract infection resulting in the altered mental status.  If this is reassuring may warrant lumbar puncture, will give her fluid bolus and continue to monitor tachycardia.  Anticipate will likely warrant admission.   Clinical Course as of 09/14/24 1533  Mon Sep 14, 2024  1233 Patient is significantly orthostatic, will continue with fluid resuscitation, awaiting urinalysis results at this time with likely anticipated admission. [SK]  1532 Spoke with Dr. Laurita from medicine team was agreed to evaluate the patient determine course for further medical at this time. [SK]    Clinical Course User Index [SK] Fernand Rossie HERO, MD     FINAL CLINICAL IMPRESSION(S) / ED DIAGNOSES   Final diagnoses:  Acute cystitis without hematuria  Metabolic encephalopathy     Rx / DC Orders   ED Discharge Orders     None        Note:  This document was prepared using Dragon voice recognition software and may include unintentional dictation errors.   Fernand Rossie HERO, MD 09/14/24 1533  "

## 2024-09-14 NOTE — ED Triage Notes (Signed)
 Pt to ED with daughter for fall 10+ times since Friday. +dizziness. Increased AMS, daughter reports normally a little confused. Reports dark brown urine. C/o back pain from fall

## 2024-09-14 NOTE — H&P (Signed)
 " History and Physical    YUMNA EBERS FMW:[id number] DOB: 01/14/1966 DOA: 09/14/2024  PCP: Orlean Alan HERO, FNP (Confirm with patient/family/NH records and if not entered, this has to be entered at [facility] point of entry) Patient coming from: Home  I have personally briefly reviewed patient's old medical records in [facility]  Chief Complaint: Feeling lightheaded, frequent falls, confusions  HPI: Kelsey Russo is a 59 y.o. female with medical history significant of breast cancer status post lumpectomy radiation therapy on tamoxifen  therapy, chronic pain syndrome, benign brain tumor, seizure disorder, HTN, anxiety/depression, sent by  family member for evaluation of worsening frequent falls confusions.  Symptoms started 3 to 4 weeks ago when patient started to have unsteady gait, frequent falls, patient reported that she has been feeling lightheaded almost every day.  She does take multiple CNS medication for pain, anxiety/depression including Klonopin , Seroquel , gabapentin .  She went to visit neurology on 08/19/2024, when neurology concerned about recurrent CNS tumor as well as seizure.  He outpatient MRI ordered and patient was started on Depakote .  Patient claims however she has not been taking Depakote   I did not like it.  Daughter however told me that patient has not been taking medication she should and frequently confused between medications.  Last few days patient will seem to have increasing falls.  But she denied any head or neck injury no LOC.  She denies any cough no urinary symptoms no diarrhea.  ED Course: Afebrile, without tachycardia blood pressure 100/71 O2 sats 95% room air.  Chest x-ray negative for acute infiltrates, blood work showed WBC 6.1 hemoglobin 13.9 BUN 12 creatinine 1.2 glucose 122 bicarb 19K 3.9.  CT head and cervical spine negative for acute findings. UA showed 1+ WBC 1+ RBC.  Patient was given ceftriaxone  in the ED.  Review of Systems: As per HPI  otherwise 14 point review of systems negative.    Past Medical History:  Diagnosis Date   Anemia    during chemotherapy   Brain tumor (benign) (HCC)    Breast cancer (HCC) 1999   Status post right breast lumpectomy and radiation   Breast cancer (HCC) 11/2019   right breast   COPD (chronic obstructive pulmonary disease) (HCC)    Depression    History of chemotherapy 1999   History of seizure disorder    Hypertension    Personal history of chemotherapy    breast cancer 2021   Personal history of radiation therapy 1999   Pneumonia    PONV (postoperative nausea and vomiting)    vomiting    Past Surgical History:  Procedure Laterality Date   BRAIN SURGERY     Brain tumor resection  1991   BREAST BIOPSY Right 1999   Positive   BREAST BIOPSY Right 11/10/2019   us  bx 10:00 heart marker, positive   BREAST BIOPSY Right 11/10/2019   us  bx axilla, hydromark 4, positive   BREAST BIOPSY Left 11/10/2019   us  bx 2:00, venus marker, neg   MASTECTOMY Right 07/13/2020   positive   PORTACATH PLACEMENT Left 11/30/2019   Procedure: INSERTION PORT-A-CATH;  Surgeon: Dessa Reyes ORN, MD;  Location: [facility] ORS;  Service: General;  Laterality: Left;   Right breast lumpectomy     SIMPLE MASTECTOMY WITH AXILLARY SENTINEL NODE BIOPSY Right 07/13/2020   Procedure: SIMPLE MASTECTOMY WITH AXILLARY SENTINEL NODE BIOPSY;  Surgeon: Dessa Reyes ORN, MD;  Location: [facility] ORS;  Service: General;  Laterality: Right;   TONSILLECTOMY  TUBAL LIGATION       reports that she has been smoking cigarettes. She has a 10 pack-year smoking history. She has never used smokeless tobacco. She reports that she does not currently use drugs after having used the following drugs: Marijuana. Frequency: 3.00 times per week. She reports that she does not drink alcohol.  Allergies[1]  Family History  Problem Relation Age of Onset   Depression Mother    CAD Father    Hypertension Father    Lung cancer Father     Coronary artery disease Father 77   Schizophrenia Maternal Uncle    Breast cancer Maternal Grandmother    Bipolar disorder Daughter      Prior to Admission medications  Medication Sig Start Date End Date Taking? Authorizing Provider  albuterol  (VENTOLIN  HFA) 108 (90 Base) MCG/ACT inhaler Inhale 1-2 puffs into the lungs every 6 (six) hours as needed for wheezing or shortness of breath. 12/31/23  Yes Orlean Alan HERO, FNP  alendronate  (FOSAMAX ) 70 MG tablet TAKE ONE TABLET (70 MG TOTAL) BY MOUTH ONCE A WEEK. TAKE WITH A FULL GLASS OF WATER ON AN EMPTY STOMACH. 07/01/24  Yes Orlean Alan HERO, FNP  clonazePAM  (KLONOPIN ) 1 MG tablet TAKE 1 TABLET (1 MG TOTAL) BY MOUTH 3  TIMES DAILY AS NEEDED FOR ANXIETY. 08/31/24  Yes Orlean Alan HERO, FNP  gabapentin  (NEURONTIN ) 300 MG capsule TAKE ONE CAPSULE (300 MG TOTAL) BY MOUTH TWO TIMES DAILY. 07/22/24  Yes Orlean Alan HERO, FNP  tamoxifen  (NOLVADEX ) 20 MG tablet TAKE ONE TABLET BY MOUTH ONCE A DAY 05/07/24  Yes Jacobo Evalene PARAS, MD  traMADol  (ULTRAM ) 50 MG tablet Take 1 tablet (50 mg total) by mouth every 6 (six) hours as needed for severe pain (pain score 7-10). 08/17/24  Yes Orlean Alan HERO, FNP  traZODone  (DESYREL ) 50 MG tablet TAKE ONE TO TWO TABLETS BY MOUTH EACH NIGHT AT BEDTIME AS NEEDED FOR INSOMNIA 06/11/24  Yes Orlean Alan HERO, FNP  triamcinolone  cream (KENALOG ) 0.1 % Apply 1 Application topically 2 (two) times daily. 03/04/24  Yes Orlean Alan HERO, FNP  divalproex  (DEPAKOTE ) 125 MG DR tablet Take 125 mg by mouth 2 (two) times daily. Patient not taking: Reported on 09/14/2024 08/20/24   [provider]  escitalopram (LEXAPRO) 10 MG tablet Take 10 mg by mouth daily. Patient not taking: Reported on 09/14/2024    [provider]  QUEtiapine  (SEROQUEL ) 25 MG tablet Take 25 mg by mouth at bedtime. Patient not taking: Reported on 09/14/2024 08/20/24   [provider]  tiZANidine  (ZANAFLEX ) 4 MG tablet Take 4 mg by mouth  at bedtime. Patient not taking: Reported on 09/14/2024 08/03/24   [provider]  venlafaxine XR (EFFEXOR-XR) 37.5 MG 24 hr capsule Take 37.5 mg by mouth daily with breakfast. Patient not taking: Reported on 09/14/2024 07/09/24   [provider]    Physical Exam: Vitals:   09/14/24 1037 09/14/24 1214 09/14/24 1342  BP: 121/84 92/72 103/71  Pulse: (!) 122 (!) 103 85  Resp: 20 18 18   Temp:  98.2 F (36.8 C)   SpO2: 95% 95% 94%  Weight: 62.6 kg    Height: 5' 5 (1.651 m)      Constitutional: NAD, calm, comfortable Vitals:   09/14/24 1037 09/14/24 1214 09/14/24 1342  BP: 121/84 92/72 103/71  Pulse: (!) 122 (!) 103 85  Resp: 20 18 18   Temp:  98.2 F (36.8 C)   SpO2: 95% 95% 94%  Weight: 62.6 kg  Height: 5' 5 (1.651 m)     Eyes: PERRL, lids and conjunctivae normal ENMT: Mucous membranes are moist. Posterior pharynx clear of any exudate or lesions.Normal dentition.  Neck: normal, supple, no masses, no thyromegaly Respiratory: clear to auscultation bilaterally, no wheezing, no crackles. Normal respiratory effort. No accessory muscle use.  Cardiovascular: Regular rate and rhythm, no murmurs / rubs / gallops. No extremity edema. 2+ pedal pulses. No carotid bruits.  Abdomen: no tenderness, no masses palpated. No hepatosplenomegaly. Bowel sounds positive.  Musculoskeletal: no clubbing / cyanosis. No joint deformity upper and lower extremities. Good ROM, no contractures. Normal muscle tone.  Skin: no rashes, lesions, ulcers. No induration Neurologic: CN 2-12 grossly intact. Sensation intact, DTR normal. Strength 5/5 in all 4.  Psychiatric: Normal judgment and insight. Alert and oriented x 3. Normal mood.     Labs on Admission: I have personally reviewed following labs and imaging studies  CBC: Recent Labs  Lab 09/14/24 1039  WBC 6.8  HGB 13.9  HCT 41.1  MCV 90.3  PLT 216   Basic Metabolic Panel: Recent Labs  Lab 09/14/24 1039  NA 136  K 3.9  CL 102   CO2 19*  GLUCOSE 121*  BUN 12  CREATININE 1.22*  CALCIUM 9.9   GFR: Estimated Creatinine Clearance: 44.1 mL/min (A) (by C-G formula based on SCr of 1.22 mg/dL (H)). Liver Function Tests: No results for input(s): AST, ALT, ALKPHOS, BILITOT, PROT, ALBUMIN in the last 168 hours. No results for input(s): LIPASE, AMYLASE in the last 168 hours. No results for input(s): AMMONIA in the last 168 hours. Coagulation Profile: No results for input(s): INR, PROTIME in the last 168 hours. Cardiac Enzymes: No results for input(s): CKTOTAL, CKMB, CKMBINDEX, TROPONINI in the last 168 hours. BNP (last 3 results) No results for input(s): PROBNP in the last 8760 hours. HbA1C: No results for input(s): HGBA1C in the last 72 hours. CBG: No results for input(s): GLUCAP in the last 168 hours. Lipid Profile: No results for input(s): CHOL, HDL, LDLCALC, TRIG, CHOLHDL, LDLDIRECT in the last 72 hours. Thyroid  Function Tests: No results for input(s): TSH, T4TOTAL, FREET4, T3FREE, THYROIDAB in the last 72 hours. Anemia Panel: No results for input(s): VITAMINB12, FOLATE, FERRITIN, TIBC, IRON, RETICCTPCT in the last 72 hours. Urine analysis:    Component Value Date/Time   COLORURINE YELLOW (A) 09/14/2024 1039   APPEARANCEUR HAZY (A) 09/14/2024 1039   LABSPEC 1.005 09/14/2024 1039   PHURINE 6.0 09/14/2024 1039   GLUCOSEU NEGATIVE 09/14/2024 1039   HGBUR NEGATIVE 09/14/2024 1039   BILIRUBINUR NEGATIVE 09/14/2024 1039   KETONESUR NEGATIVE 09/14/2024 1039   PROTEINUR NEGATIVE 09/14/2024 1039   NITRITE NEGATIVE 09/14/2024 1039   LEUKOCYTESUR TRACE (A) 09/14/2024 1039    Radiological Exams on Admission: DG Chest 1 View Result Date: 09/14/2024 CLINICAL DATA:  Altered level of consciousness, dizziness EXAM: CHEST  1 VIEW COMPARISON:  08/03/2024 FINDINGS: Single frontal view of the chest demonstrates an unremarkable cardiac silhouette.  Persistent findings of emphysema and bibasilar scarring. No acute airspace disease, effusion, or pneumothorax. No acute bony abnormalities. IMPRESSION: 1. Emphysema and bibasilar scarring. 2. No acute airspace disease. Electronically Signed   By: Ozell Daring M.D.   On: 09/14/2024 16:36   CT Cervical Spine Wo Contrast Result Date: 09/14/2024 EXAM: CT CERVICAL SPINE WITHOUT CONTRAST 09/14/2024 11:16:28 AM TECHNIQUE: CT of the cervical spine was performed without the administration of intravenous contrast. Multiplanar reformatted images are provided for review. Automated exposure control, iterative reconstruction, and/or weight based  adjustment of the mA/kV was utilized to reduce the radiation dose to as low as reasonably achievable. COMPARISON: CT cervical spine 08/03/2024. CLINICAL HISTORY: Ataxia, cervical trauma. Fall, dizziness, and confusion. FINDINGS: BONES AND ALIGNMENT: Mild left convex curvature of the cervical spine. No acute traumatic malalignment or acute fracture. DEGENERATIVE CHANGES: Diffuse cervical disc degeneration with advanced disc space narrowing and degenerative endplate changes again noted at C3-C4, C5-C6, and C6-C7. At least moderate multilevel neural foraminal and spinal stenosis. SOFT TISSUES: No prevertebral soft tissue swelling. Emphysema. IMPRESSION: 1. No acute cervical spine fracture or traumatic malalignment. 2. Diffuse cervical disc degeneration. 3. Pulmonary emphysema is an independent risk factor for lung cancer, and consideration is recommended for evaluation for a low-dose CT lung cancer screening program. Electronically signed by: Dasie Hamburg MD MD 09/14/2024 11:55 AM EST RP Workstation: [id number]   CT Head Wo Contrast Result Date: 09/14/2024 EXAM: CT HEAD WITHOUT CONTRAST 09/14/2024 11:16:28 AM TECHNIQUE: CT of the head was performed without the administration of intravenous contrast. Automated exposure control, iterative reconstruction, and/or weight based adjustment  of the mA/kV was utilized to reduce the radiation dose to as low as reasonably achievable. COMPARISON: Head CT 08/03/2024. CLINICAL HISTORY: Head trauma. Falls, dizziness, and confusion. FINDINGS: BRAIN AND VENTRICLES: There is no evidence of an acute infarct, intracranial hemorrhage, mass, midline shift, hydrocephalus, or extra-axial fluid collection. Calcified atherosclerosis at the skull base. ORBITS: No acute abnormality. SINUSES: No acute abnormality. SOFT TISSUES AND SKULL: No acute soft tissue abnormality. No skull fracture. Suboccipital craniotomy. IMPRESSION: 1. No acute intracranial abnormality. Electronically signed by: Dasie Hamburg MD MD 09/14/2024 11:50 AM EST RP Workstation: [id number]    EKG: Independently reviewed.  Sinus rhythm, no acute ST changes.  Assessment/Plan Principal Problem:   Encephalopathy Active Problems:   Acute encephalopathy  (please populate well all problems here in Problem List. (For example, if patient is on BP meds at home and you resume or decide to hold them, it is a problem that needs to be her. Same for CAD, COPD, HLD and so on)  Acute/subacute encephalopathy Ataxia - Clinically suspect polypharmacy - Open 21 patient's medication list with daughter over the phone, in addition to muscle relaxant, Klonopin , gabapentin , Seroquel  and trazodone  at night, patient also takes Ambien .  And although patient claimed that she does not take tramadol  however daughter suspect patient does still take tramadol  on a daily basis. - Will cut down Klonopin  and gabapentin  dosage tonight - Hold off Ambien  - Continue at bedtime trazodone , hold off Seroquel  - Review most recent neurology outpatient visit record, who suspect recurrent intracranial etiology and possible seizure.  Will order MRI with and without contrast and EEG. - Orthostatic vital signs - PT evaluation - Other DDx, low suspicion for UTI, discontinue antibiotics.  Check TSH and B12  level.  Anxiety/depression Chronic pain - Adjust her medications as above  History of seizure disorder - Hold off Depakote  today for EEG study  History of breast cancer - Continue tamoxifen   DVT prophylaxis: Lovenox  Code Status: Full code Family Communication: Daughter over the phone Disposition Plan: Expect less than 2 midnight hospital stay Consults called: None Admission status: Telemetry observation   Cort ONEIDA Mana MD [facility] Pager [phone]  09/14/2024, 4:45 PM       [1]  Allergies Allergen Reactions   Imitrex [Sumatriptan] Anaphylaxis   Penicillins Rash    Tolerated augmentin  in 2021   Amoxicillin     "

## 2024-09-14 NOTE — ED Notes (Signed)
 Lying BP: 89/68 Lying HR: 63 Sitting BP: 86/72 Sitting HR: 99 Standing BP: 92/72 Standing HR: 103

## 2024-09-15 ENCOUNTER — Observation Stay: Payer: MEDICAID

## 2024-09-15 DIAGNOSIS — Z5329 Procedure and treatment not carried out because of patient's decision for other reasons: Secondary | ICD-10-CM | POA: Diagnosis not present

## 2024-09-15 DIAGNOSIS — G9341 Metabolic encephalopathy: Secondary | ICD-10-CM | POA: Diagnosis present

## 2024-09-15 DIAGNOSIS — R4182 Altered mental status, unspecified: Secondary | ICD-10-CM

## 2024-09-15 DIAGNOSIS — J449 Chronic obstructive pulmonary disease, unspecified: Secondary | ICD-10-CM | POA: Diagnosis present

## 2024-09-15 DIAGNOSIS — W19XXXA Unspecified fall, initial encounter: Secondary | ICD-10-CM | POA: Diagnosis present

## 2024-09-15 DIAGNOSIS — R39198 Other difficulties with micturition: Secondary | ICD-10-CM | POA: Diagnosis present

## 2024-09-15 DIAGNOSIS — E871 Hypo-osmolality and hyponatremia: Secondary | ICD-10-CM | POA: Diagnosis present

## 2024-09-15 DIAGNOSIS — R519 Headache, unspecified: Secondary | ICD-10-CM | POA: Diagnosis present

## 2024-09-15 DIAGNOSIS — R296 Repeated falls: Secondary | ICD-10-CM | POA: Diagnosis present

## 2024-09-15 DIAGNOSIS — E876 Hypokalemia: Secondary | ICD-10-CM | POA: Diagnosis present

## 2024-09-15 DIAGNOSIS — F419 Anxiety disorder, unspecified: Secondary | ICD-10-CM | POA: Diagnosis present

## 2024-09-15 DIAGNOSIS — C50411 Malignant neoplasm of upper-outer quadrant of right female breast: Secondary | ICD-10-CM

## 2024-09-15 DIAGNOSIS — E162 Hypoglycemia, unspecified: Secondary | ICD-10-CM

## 2024-09-15 DIAGNOSIS — R569 Unspecified convulsions: Secondary | ICD-10-CM | POA: Diagnosis not present

## 2024-09-15 DIAGNOSIS — Z8249 Family history of ischemic heart disease and other diseases of the circulatory system: Secondary | ICD-10-CM | POA: Diagnosis not present

## 2024-09-15 DIAGNOSIS — F32A Depression, unspecified: Secondary | ICD-10-CM | POA: Diagnosis present

## 2024-09-15 DIAGNOSIS — T50915A Adverse effect of multiple unspecified drugs, medicaments and biological substances, initial encounter: Secondary | ICD-10-CM | POA: Diagnosis present

## 2024-09-15 DIAGNOSIS — E872 Acidosis, unspecified: Secondary | ICD-10-CM | POA: Diagnosis present

## 2024-09-15 DIAGNOSIS — F17210 Nicotine dependence, cigarettes, uncomplicated: Secondary | ICD-10-CM | POA: Diagnosis present

## 2024-09-15 DIAGNOSIS — I10 Essential (primary) hypertension: Secondary | ICD-10-CM | POA: Diagnosis present

## 2024-09-15 DIAGNOSIS — N179 Acute kidney failure, unspecified: Secondary | ICD-10-CM | POA: Diagnosis present

## 2024-09-15 DIAGNOSIS — R270 Ataxia, unspecified: Secondary | ICD-10-CM | POA: Diagnosis present

## 2024-09-15 DIAGNOSIS — E860 Dehydration: Secondary | ICD-10-CM | POA: Diagnosis present

## 2024-09-15 DIAGNOSIS — Z7983 Long term (current) use of bisphosphonates: Secondary | ICD-10-CM | POA: Diagnosis not present

## 2024-09-15 DIAGNOSIS — G9340 Encephalopathy, unspecified: Secondary | ICD-10-CM | POA: Diagnosis not present

## 2024-09-15 DIAGNOSIS — G40909 Epilepsy, unspecified, not intractable, without status epilepticus: Secondary | ICD-10-CM | POA: Diagnosis present

## 2024-09-15 DIAGNOSIS — G894 Chronic pain syndrome: Secondary | ICD-10-CM | POA: Diagnosis present

## 2024-09-15 DIAGNOSIS — H532 Diplopia: Secondary | ICD-10-CM | POA: Diagnosis present

## 2024-09-15 DIAGNOSIS — T426X6A Underdosing of other antiepileptic and sedative-hypnotic drugs, initial encounter: Secondary | ICD-10-CM | POA: Diagnosis present

## 2024-09-15 LAB — TSH: TSH: 2.02 u[IU]/mL (ref 0.350–4.500)

## 2024-09-15 MED ORDER — SODIUM BICARBONATE 650 MG PO TABS
650.0000 mg | ORAL_TABLET | Freq: Two times a day (BID) | ORAL | Status: DC
  Administered 2024-09-15: 650 mg via ORAL
  Filled 2024-09-15: qty 1

## 2024-09-15 MED ORDER — LACTATED RINGERS IV SOLN: INTRAVENOUS | Status: DC

## 2024-09-15 MED ORDER — QUETIAPINE FUMARATE 25 MG PO TABS: 25.0000 mg | ORAL_TABLET | Freq: Every day | ORAL | Status: DC

## 2024-09-15 MED ORDER — DIVALPROEX SODIUM 125 MG PO CSDR
125.0000 mg | DELAYED_RELEASE_CAPSULE | Freq: Two times a day (BID) | ORAL | Status: DC
  Filled 2024-09-15: qty 1

## 2024-09-15 NOTE — Progress Notes (Signed)
 Eeg done

## 2024-09-15 NOTE — Progress Notes (Signed)
 Pt left AMA via friend despite multiple re-directives from this RN and Hospitalist.

## 2024-09-15 NOTE — Progress Notes (Signed)
 PT Cancellation Note  Patient Details Name: Kelsey Russo MRN: [id number] DOB: 1966-05-30   Cancelled Treatment:    Reason Eval/Treat Not Completed: PT screened, no needs identified, will sign off (Consult received and chart reviewed. Patient noted to be indep ambulatory in hallway without assist device; appears grossly confused, but no acute mobility deficits. Will complete PT order at this time; please reconsult should needs change.)  Ciel Chervenak H. Delores, PT, DPT, NCS 09/15/2024, 11:08 AM [phone]

## 2024-09-15 NOTE — Progress Notes (Signed)
" °  Progress Note   Patient: Kelsey Russo FMW:[id number] DOB: 1966/01/13 DOA: 09/14/2024     0 DOS: the patient was seen and examined on 09/15/2024   Brief hospital course: Dymon G Tankard is a 59 y.o. female with medical history significant of breast cancer status post lumpectomy radiation therapy on tamoxifen  therapy, chronic pain syndrome, benign brain tumor, seizure disorder, HTN, anxiety/depression, sent by  family member for evaluation of worsening frequent falls confusions.  Workup showed mild AKI, hyponatremia, hypokalemia.  MRI brain with contrast did not show any acute changes. EEG is pending.  Psychiatry consult pending. Appear to be due to overmedication as well as hypoglycemia.   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 Hypoglycemia. Polypharmacy. Frequent falls. Patient still has some confusion, but knows the date.  Unknown if patient has any psychological condition.  Obtain psychiatry consult. Patient also his has a history of small cell lung cancer, MRI brain did not show any metastasis. Altered mental status could be due to polypharmacy and hypoglycemia.  Patient glucose was 64 at time of admission.  Follow CBG 4 times a day. Patient is able to walk pendantly in the hallway, no need for PT. EEG pending.  Acute kidney injury. Hyponatremia. Hypokalemia. Mild metabolic acidosis. Per patient daughter, patient had not been eating well due to lacking of denture.  She appear has some dehydration, will continue some IV fluids.  Potassium has normalized.  Anxiety/depression. Chronic pain. Continue home medications.  Seizure disorder. Continue Depakote , EEG  pending.    Subjective:  Patient appeared to have some confusion, denied any abdominal pain or nausea vomiting.  Physical Exam: Vitals:   09/15/24 0430 09/15/24 0441 09/15/24 0548 09/15/24 1225  BP:   104/73 108/80  Pulse: 76  78 82  Resp:   15 18  Temp:  98.1 F (36.7 C)  98.2 F (36.8 C)  TempSrc:  Oral  Oral  SpO2: 97%  93% 93%  Weight:      Height:       General exam: Appears calm and comfortable  Respiratory system: Clear to auscultation. Respiratory effort normal. Cardiovascular system: S1 & S2 heard, RRR. No JVD, murmurs, rubs, gallops or clicks. No pedal edema. Gastrointestinal system: Abdomen is nondistended, soft and nontender. No organomegaly or masses felt. Normal bowel sounds heard. Central nervous system: Alert and oriented x2. No focal neurological deficits. Extremities: Symmetric 5 x 5 power. Skin: No rashes, lesions or ulcers Psychiatry: Anxious.   Data Reviewed:  Chest x-ray and lab results reviewed.  MRI brain results reviewed  Family Communication: Daughter updated over the phone.  Disposition: Status is: Observation      Time spent: 50 minutes  Author: Murvin Mana, MD 09/15/2024 2:34 PM  For on call review [email or web address].    "

## 2024-09-15 NOTE — Hospital Course (Signed)
 Kelsey Russo is a 59 y.o. female with medical history significant of breast cancer status post lumpectomy radiation therapy on tamoxifen  therapy, chronic pain syndrome, benign brain tumor, seizure disorder, HTN, anxiety/depression, sent by  family member for evaluation of worsening frequent falls confusions.  Workup showed mild AKI, hyponatremia, hypokalemia.  MRI brain with contrast did not show any acute changes. EEG is pending.  Psychiatry consult pending. Appear to be due to overmedication as well as hypoglycemia.

## 2024-09-15 NOTE — ED Notes (Signed)
 Pt refused vitals

## 2024-09-15 NOTE — Discharge Summary (Signed)
 Patient left hospital against medical advice. I was told by the nurse that patient wants to leave the hospital.  I went up to the bedside in 3 minutes; she already left the hospital.  From my previous examination, patient knows her condition, and has decision making capacity. Will not ask security to get patient back. Talked with daughter, she will keep an eye on her.  Can bring her back to the hospital if needed.

## 2024-09-16 NOTE — Procedures (Signed)
 Patient Name: Kelsey Russo  MRN: [id number]  Epilepsy Attending: Arlin MALVA Krebs  Referring Physician/Provider: Laurita Cort DASEN, MD  Date: 09/16/2024 Duration: 22.59 mins  Patient history: 59yo F with ams. EEG to evaluate for seizure  Level of alertness: Awake  AEDs during EEG study: Clonazepam , VPA  Technical aspects: This EEG study was done with scalp electrodes positioned according to the 10-20 International system of electrode placement. Electrical activity was reviewed with band pass filter of 1-70Hz , sensitivity of 7 uV/mm, display speed of 68mm/sec with a 60Hz  notched filter applied as appropriate. EEG data were recorded continuously and digitally stored.  Video monitoring was available and reviewed as appropriate.  Description: The posterior dominant rhythm consists of 8-9 Hz activity of moderate voltage (25-35 uV) seen predominantly in posterior head regions, symmetric and reactive to eye opening and eye closing. There is an excessive amount of 15 to 18 Hz beta activity distributed symmetrically and diffusely.  Hyperventilation and photic stimulation were not performed.     ABNORMALITY - Excessive beta, generalized  IMPRESSION: This study is within normal limits. The excessive beta activity seen in the background is most likely due to the effect of benzodiazepine and is a benign EEG pattern. No seizures or epileptiform discharges were seen throughout the recording.  A normal interictal EEG does not exclude the diagnosis of epilepsy.   Vineeth Fell O Jomari Bartnik

## 2024-09-17 ENCOUNTER — Encounter: Payer: Self-pay | Admitting: Family

## 2024-09-17 ENCOUNTER — Inpatient Hospital Stay: Payer: MEDICAID

## 2024-09-17 ENCOUNTER — Emergency Department: Admit: 2024-09-17 | Payer: MEDICAID

## 2024-09-17 ENCOUNTER — Telehealth: Payer: MEDICAID

## 2024-09-17 ENCOUNTER — Inpatient Hospital Stay: Payer: MEDICAID | Admitting: Oncology

## 2024-09-17 ENCOUNTER — Telehealth: Payer: Self-pay | Admitting: Oncology

## 2024-09-17 DIAGNOSIS — R1110 Vomiting, unspecified: Secondary | ICD-10-CM

## 2024-09-17 DIAGNOSIS — R82998 Other abnormal findings in urine: Secondary | ICD-10-CM

## 2024-09-17 DIAGNOSIS — R112 Nausea with vomiting, unspecified: Secondary | ICD-10-CM

## 2024-09-17 NOTE — Telephone Encounter (Signed)
 Pt called to r/s appts for today - said she didn't feel like coming in today - asked for appts next week - r/s w/pt and pt confirmed date/time - LH

## 2024-09-18 ENCOUNTER — Ambulatory Visit: Payer: MEDICAID

## 2024-09-18 ENCOUNTER — Other Ambulatory Visit: Payer: Self-pay | Admitting: Family

## 2024-09-18 NOTE — Progress Notes (Signed)
 Based on what you shared with me, I feel your condition warrants further evaluation as soon as possible at an Emergency department. More so I worry about your continued vomiting/dry heaves with decreased intake in the setting of your recent metabolic encephalopathy and UTI on 09/14/24. I am highly concerned for worsening dehydration which can continue to contribute to the previously treated metabolic encephalopathy.     NOTE: There will be NO CHARGE for this eVisit   If you are having a true medical emergency please call 911.      Emergency Department- [facility]  Get Driving Directions  [phone]  [address]  [place], [place] [address]  Open 24/7/365      [facility] Emergency Department at [facility]  Get Driving Directions  [address]  [place], [place] [address]  Open 24/7/365    Emergency Department- [facility]  Get Driving Directions  [phone]  [address]  [place], [place] [address]  Open 24/7/365      [facility]  Get Driving Directions  [phone]  [address]  [place], [place] [address]  Open 24/7/365    [facility]  Emergency Department- [facility]  Get Driving Directions  [phone]  [address]  [place], [place] [address]  Open 24/7/365    [place]  Emergency Department- [facility]  Get Driving Directions  [address]  [place], [place] [address]  Open 24/7/365    [facility]  Emergency Department- [facility]  Get Driving Directions  [phone]  [address]  [place], [place] [address]  Open 24/7/365

## 2024-09-18 NOTE — Telephone Encounter (Signed)
 Almarie spoke with the patient and she will be calling back to make a nurse visit

## 2024-09-21 ENCOUNTER — Ambulatory Visit: Payer: MEDICAID

## 2024-09-22 ENCOUNTER — Inpatient Hospital Stay: Payer: MEDICAID | Admitting: Oncology

## 2024-09-22 ENCOUNTER — Encounter: Payer: Self-pay | Admitting: Oncology

## 2024-09-22 ENCOUNTER — Inpatient Hospital Stay: Payer: MEDICAID | Attending: Oncology

## 2024-09-22 ENCOUNTER — Ambulatory Visit: Payer: MEDICAID | Admitting: Family

## 2024-09-22 VITALS — BP 124/88 | HR 110 | Temp 97.6°F | Resp 18 | Ht 65.0 in | Wt 130.0 lb

## 2024-09-22 DIAGNOSIS — M818 Other osteoporosis without current pathological fracture: Secondary | ICD-10-CM

## 2024-09-22 DIAGNOSIS — C50411 Malignant neoplasm of upper-outer quadrant of right female breast: Secondary | ICD-10-CM | POA: Diagnosis not present

## 2024-09-22 LAB — CBC WITH DIFFERENTIAL/PLATELET
Abs Immature Granulocytes: 0.03 K/uL (ref 0.00–0.07)
Basophils Absolute: 0.1 K/uL (ref 0.0–0.1)
Basophils Relative: 1 %
Eosinophils Absolute: 0.1 K/uL (ref 0.0–0.5)
Eosinophils Relative: 1 %
HCT: 40 % (ref 36.0–46.0)
Hemoglobin: 13.5 g/dL (ref 12.0–15.0)
Immature Granulocytes: 1 %
Lymphocytes Relative: 22 %
Lymphs Abs: 1.4 K/uL (ref 0.7–4.0)
MCH: 30.8 pg (ref 26.0–34.0)
MCHC: 33.8 g/dL (ref 30.0–36.0)
MCV: 91.3 fL (ref 80.0–100.0)
Monocytes Absolute: 0.4 K/uL (ref 0.1–1.0)
Monocytes Relative: 6 %
Neutro Abs: 4.5 K/uL (ref 1.7–7.7)
Neutrophils Relative %: 69 %
Platelets: 277 K/uL (ref 150–400)
RBC: 4.38 MIL/uL (ref 3.87–5.11)
RDW: 12.9 % (ref 11.5–15.5)
WBC: 6.4 K/uL (ref 4.0–10.5)
nRBC: 0 % (ref 0.0–0.2)

## 2024-09-22 LAB — CMP (CANCER CENTER ONLY)
ALT: 31 U/L (ref 0–44)
AST: 55 U/L — ABNORMAL HIGH (ref 15–41)
Albumin: 3.7 g/dL (ref 3.5–5.0)
Alkaline Phosphatase: 65 U/L (ref 38–126)
Anion gap: 11 (ref 5–15)
BUN: 18 mg/dL (ref 6–20)
CO2: 20 mmol/L — ABNORMAL LOW (ref 22–32)
Calcium: 9.5 mg/dL (ref 8.9–10.3)
Chloride: 106 mmol/L (ref 98–111)
Creatinine: 0.86 mg/dL (ref 0.44–1.00)
GFR, Estimated: >60 mL/min
Glucose, Bld: 136 mg/dL — ABNORMAL HIGH (ref 70–99)
Potassium: 4.3 mmol/L (ref 3.5–5.1)
Sodium: 137 mmol/L (ref 135–145)
Total Bilirubin: 0.3 mg/dL (ref 0.0–1.2)
Total Protein: 6.7 g/dL (ref 6.5–8.1)

## 2024-09-22 NOTE — Progress Notes (Unsigned)
 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  INTERVAL HISTORY: Patient returns to clinic today for routine 61-month evaluation.  She continues to have multiple medical complaints including chronic back and neck pain as well as increased anxiety.  She also has several lesions on her hands.  She continues to take tamoxifen  and Fosamax  without significant side effects. She has no neurologic complaints.  She does not complain of weakness or fatigue today.  She has a good appetite.  She denies any recent fevers or illnesses. She has no chest pain, shortness of breath, cough, or hemoptysis.  She denies any nausea, vomiting, constipation, or diarrhea.  She has no urinary complaints.  Patient offers no further specific complaints today.  REVIEW OF SYSTEMS:   Review of Systems  Constitutional: Negative.  Negative for fever, malaise/fatigue and weight loss.  HENT:  Negative for sore throat.   Respiratory: Negative.  Negative for cough, hemoptysis and shortness of breath.   Cardiovascular: Negative.  Negative for chest pain, palpitations and leg swelling.  Gastrointestinal:  Negative for abdominal pain, diarrhea, nausea and vomiting.  Genitourinary: Negative.  Negative for dysuria.  Musculoskeletal:  Positive for back pain and neck pain. Negative for myalgias.  Skin: Negative.  Negative for rash.  Neurological: Negative.  Negative for dizziness, sensory change, weakness and headaches.  Psychiatric/Behavioral:  Positive for depression. The patient is nervous/anxious. The patient does not  have insomnia.     As per HPI. Otherwise, a complete review of systems is negative.  PAST MEDICAL HISTORY: Past Medical History:  Diagnosis Date   Anemia    during chemotherapy   Brain tumor (benign) (HCC)    Breast cancer (HCC) 1999   Status post right breast lumpectomy and radiation   Breast cancer (HCC) 11/2019   right breast   COPD (chronic obstructive pulmonary disease) (HCC)    Depression    History of chemotherapy 1999   History of seizure disorder    Hypertension    Personal history of chemotherapy    breast cancer 2021   Personal history of radiation therapy 1999   Pneumonia    PONV (postoperative nausea and vomiting)    vomiting    PAST SURGICAL HISTORY: Past Surgical History:  Procedure Laterality Date   BRAIN SURGERY     Brain tumor resection  1991   BREAST BIOPSY Right 1999   Positive   BREAST BIOPSY Right 11/10/2019   us  bx 10:00 heart marker, positive   BREAST BIOPSY Right 11/10/2019   us  bx axilla, hydromark 4, positive   BREAST BIOPSY Left 11/10/2019   us  bx 2:00, venus marker, neg   MASTECTOMY Right 07/13/2020   positive   PORTACATH PLACEMENT Left 11/30/2019   Procedure: INSERTION PORT-A-CATH;  Surgeon: Dessa Reyes ORN, MD;  Location: [facility] ORS;  Service: General;  Laterality: Left;   Right breast lumpectomy     SIMPLE MASTECTOMY WITH AXILLARY SENTINEL NODE BIOPSY Right 07/13/2020   Procedure: SIMPLE MASTECTOMY WITH AXILLARY SENTINEL NODE BIOPSY;  Surgeon: Dessa Reyes ORN, MD;  Location: [facility] ORS;  Service: General;  Laterality: Right;   TONSILLECTOMY     TUBAL  LIGATION      FAMILY HISTORY: Family History  Problem Relation Age of Onset   Depression Mother    CAD Father    Hypertension Father    Lung cancer Father    Coronary artery disease Father 1   Schizophrenia Maternal Uncle    Breast cancer Maternal Grandmother    Bipolar disorder Daughter     ADVANCED DIRECTIVES (Y/N):  N  HEALTH MAINTENANCE: Social History   Tobacco  Use   Smoking status: Every Day    Current packs/day: 0.25    Average packs/day: 0.3 packs/day for 40.0 years (10.0 ttl pk-yrs)    Types: Cigarettes   Smokeless tobacco: Never  Vaping Use   Vaping status: Never Used  Substance Use Topics   Alcohol use: No    Alcohol/week: 0.0 standard drinks of alcohol   Drug use: Not Currently    Frequency: 3.0 times per week    Types: Marijuana     Colonoscopy:  PAP:  Bone density:  Lipid panel:  Allergies  Allergen Reactions   Imitrex [Sumatriptan] Anaphylaxis   Penicillins Rash    Tolerated augmentin  in 2021   Amoxicillin      Current Outpatient Medications  Medication Sig Dispense Refill   albuterol  (VENTOLIN  HFA) 108 (90 Base) MCG/ACT inhaler Inhale 1-2 puffs into the lungs every 6 (six) hours as needed for wheezing or shortness of breath. 18 g 3   alendronate  (FOSAMAX ) 70 MG tablet TAKE ONE TABLET (70 MG TOTAL) BY MOUTH ONCE A WEEK. TAKE WITH A FULL GLASS OF WATER ON AN EMPTY STOMACH. 4 tablet 3   cyclobenzaprine  (FLEXERIL ) 10 MG tablet Take 10 mg by mouth 2 (two) times daily as needed.     gabapentin  (NEURONTIN ) 300 MG capsule TAKE ONE CAPSULE (300 MG TOTAL) BY MOUTH TWO TIMES DAILY. 90 capsule 1   QUEtiapine  (SEROQUEL ) 25 MG tablet Take 25 mg by mouth at bedtime.     tamoxifen  (NOLVADEX ) 20 MG tablet TAKE ONE TABLET BY MOUTH ONCE A DAY 30 tablet 3   clonazePAM  (KLONOPIN ) 1 MG tablet TAKE 1 TABLET (1 MG TOTAL) BY MOUTH 3  TIMES DAILY AS NEEDED FOR ANXIETY. (Patient not taking: Reported on 09/22/2024) 90 tablet 2   traMADol  (ULTRAM ) 50 MG tablet Take 1 tablet (50 mg total) by mouth every 6 (six) hours as needed for severe pain (pain score 7-10). (Patient not taking: Reported on 09/22/2024) 120 tablet 0   traZODone  (DESYREL ) 50 MG tablet TAKE ONE TO TWO TABLETS BY MOUTH EACH NIGHT AT BEDTIME AS NEEDED FOR INSOMNIA (Patient not taking: Reported on 09/22/2024) 180 tablet 3   No current facility-administered medications for this visit.     OBJECTIVE: Vitals:   09/22/24 1327  BP: 124/88  Pulse: (!) 110  Resp: 18  Temp: 97.6 F (36.4 C)  SpO2: 100%      Body mass index is 21.63 kg/m.    ECOG FS:2 - Symptomatic, <50% confined to bed  General: Well-developed, well-nourished, no acute distress. Eyes: Pink conjunctiva, anicteric sclera. HEENT: Normocephalic, moist mucous membranes. Lungs: No audible wheezing or coughing. Heart: Regular rate and rhythm. Abdomen: Soft, nontender, no obvious distention. Musculoskeletal: No edema, cyanosis, or clubbing. Neuro: Alert, answering all questions appropriately. Cranial nerves grossly intact. Skin: No rashes or petechiae noted. Psych: Normal affect.  LAB RESULTS:  Lab Results  Component Value Date   NA 137 09/22/2024   K 4.3 09/22/2024   CL 106 09/22/2024   CO2 20 (L) 09/22/2024  GLUCOSE 136 (H) 09/22/2024   BUN 18 09/22/2024   CREATININE 0.86 09/22/2024   CALCIUM 9.5 09/22/2024   PROT 6.7 09/22/2024   ALBUMIN 3.7 09/22/2024   AST 55 (H) 09/22/2024   ALT 31 09/22/2024   ALKPHOS 65 09/22/2024   BILITOT 0.3 09/22/2024   GFRNONAA >60 09/22/2024   GFRAA >60 04/20/2020    Lab Results  Component Value Date   WBC 6.4 09/22/2024   NEUTROABS 4.5 09/22/2024   HGB 13.5 09/22/2024   HCT 40.0 09/22/2024   MCV 91.3 09/22/2024   PLT 277 09/22/2024     STUDIES: EEG adult Result Date: 09/15/2024 Shelton Arlin KIDD, MD     09/16/2024 11:22 AM Patient Name: Kelsey Russo MRN: [id number] Epilepsy Attending: Arlin KIDD Shelton Referring Physician/Provider: Laurita Cort DASEN, MD Date: 09/16/2024 Duration: 22.59 mins Patient history: 59yo F with ams. EEG to evaluate for seizure Level of alertness: Awake AEDs during EEG study: Clonazepam , VPA Technical aspects: This EEG study was done with scalp electrodes positioned according to the 10-20 International system of electrode placement. Electrical activity was reviewed with band pass filter of 1-70Hz , sensitivity of 7 uV/mm, display  speed of 7mm/sec with a 60Hz  notched filter applied as appropriate. EEG data were recorded continuously and digitally stored.  Video monitoring was available and reviewed as appropriate. Description: The posterior dominant rhythm consists of 8-9 Hz activity of moderate voltage (25-35 uV) seen predominantly in posterior head regions, symmetric and reactive to eye opening and eye closing. There is an excessive amount of 15 to 18 Hz beta activity distributed symmetrically and diffusely. Hyperventilation and photic stimulation were not performed.   ABNORMALITY - Excessive beta, generalized IMPRESSION: This study is within normal limits. The excessive beta activity seen in the background is most likely due to the effect of benzodiazepine and is a benign EEG pattern. No seizures or epileptiform discharges were seen throughout the recording. A normal interictal EEG does not exclude the diagnosis of epilepsy. Priyanka O Yadav   MR BRAIN W WO CONTRAST Result Date: 09/14/2024 EXAM: MRI BRAIN WITH AND WITHOUT CONTRAST 09/14/2024 06:53:30 PM TECHNIQUE: Multiplanar multisequence MRI of the head/brain was performed with and without the administration of intravenous contrast. COMPARISON: Head CT 09/14/2024 and MRI 07/13/2015. CLINICAL HISTORY: Mental status change, unknown cause. FINDINGS: The examination is intermittently mildly to moderately motion degraded. BRAIN AND VENTRICLES: There is no evidence of an acute infarct, intracranial hemorrhage, mass, midline shift, hydrocephalus, or extra-axial fluid collection. There is mild cerebral and cerebellar atrophy. No significant white matter disease is seen for age. No abnormal enhancement is identified. Major intracranial vascular flow voids are preserved. ORBITS: No acute abnormality. SINUSES: Mild left ethmoid air cell mucosal thickening. Trace right mastoid fluid. BONES AND SOFT TISSUES: Suboccipital craniotomy. IMPRESSION: 1. No acute intracranial abnormality or mass.  Electronically signed by: Dasie Hamburg MD MD 09/14/2024 08:01 PM EST RP Workstation: [id number]   DG Chest 1 View Result Date: 09/14/2024 CLINICAL DATA:  Altered level of consciousness, dizziness EXAM: CHEST  1 VIEW COMPARISON:  08/03/2024 FINDINGS: Single frontal view of the chest demonstrates an unremarkable cardiac silhouette. Persistent findings of emphysema and bibasilar scarring. No acute airspace disease, effusion, or pneumothorax. No acute bony abnormalities. IMPRESSION: 1. Emphysema and bibasilar scarring. 2. No acute airspace disease. Electronically Signed   By: Ozell Daring M.D.   On: 09/14/2024 16:36   CT Cervical Spine Wo Contrast Result Date: 09/14/2024 EXAM: CT CERVICAL SPINE WITHOUT CONTRAST 09/14/2024 11:16:28 AM TECHNIQUE: CT of the  cervical spine was performed without the administration of intravenous contrast. Multiplanar reformatted images are provided for review. Automated exposure control, iterative reconstruction, and/or weight based adjustment of the mA/kV was utilized to reduce the radiation dose to as low as reasonably achievable. COMPARISON: CT cervical spine 08/03/2024. CLINICAL HISTORY: Ataxia, cervical trauma. Fall, dizziness, and confusion. FINDINGS: BONES AND ALIGNMENT: Mild left convex curvature of the cervical spine. No acute traumatic malalignment or acute fracture. DEGENERATIVE CHANGES: Diffuse cervical disc degeneration with advanced disc space narrowing and degenerative endplate changes again noted at C3-C4, C5-C6, and C6-C7. At least moderate multilevel neural foraminal and spinal stenosis. SOFT TISSUES: No prevertebral soft tissue swelling. Emphysema. IMPRESSION: 1. No acute cervical spine fracture or traumatic malalignment. 2. Diffuse cervical disc degeneration. 3. Pulmonary emphysema is an independent risk factor for lung cancer, and consideration is recommended for evaluation for a low-dose CT lung cancer screening program. Electronically signed by: Dasie Hamburg MD  MD 09/14/2024 11:55 AM EST RP Workstation: [id number]   CT Head Wo Contrast Result Date: 09/14/2024 EXAM: CT HEAD WITHOUT CONTRAST 09/14/2024 11:16:28 AM TECHNIQUE: CT of the head was performed without the administration of intravenous contrast. Automated exposure control, iterative reconstruction, and/or weight based adjustment of the mA/kV was utilized to reduce the radiation dose to as low as reasonably achievable. COMPARISON: Head CT 08/03/2024. CLINICAL HISTORY: Head trauma. Falls, dizziness, and confusion. FINDINGS: BRAIN AND VENTRICLES: There is no evidence of an acute infarct, intracranial hemorrhage, mass, midline shift, hydrocephalus, or extra-axial fluid collection. Calcified atherosclerosis at the skull base. ORBITS: No acute abnormality. SINUSES: No acute abnormality. SOFT TISSUES AND SKULL: No acute soft tissue abnormality. No skull fracture. Suboccipital craniotomy. IMPRESSION: 1. No acute intracranial abnormality. Electronically signed by: Dasie Hamburg MD MD 09/14/2024 11:50 AM EST RP Workstation: [id number]    ONCOLOGY HISTORY: Patient was initially stage IIIa prior to neoadjuvant chemotherapy. She completed 4 cycles of Adriamycin  and Cytoxan .  She only received 10 infusions of weekly Taxol  her last one occurring on April 06, 2020.  Given her persistently decreased performance status and worsening neuropathy, treatment was discontinued. She underwent simple mastectomy on July 13, 2020 with significant improvement of her disease burden.  She did not require XRT given her simple mastectomy.   Her most recent CT scan on December 05, 2020 did not reveal any evidence of recurrent or progressive disease.   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  Letrozole  was  discontinued secondary to osteoporosis and patient was initiated on tamoxifen  which she will require to take for a minimum of 5 years completing treatment in December 2026. Given her high risk disease, could possibly extend treatment for 7 to 10 years.  Patient's most recent left breast screening mammogram on Jan 15, 2024 was reported as BI-RADS 1.  Repeat in May 2026.  Return to clinic in 6 months for routine evaluation. History of DCIS: Status post lumpectomy and XRT.  Patient reports she did not take any adjuvant hormonal therapy.  Given this history, previously referred patient to genetic counseling for evaluation and testing. Osteoporosis: Patient's most recent bone mineral density on Jan 15, 2024 revealed persistent osteoporosis.  Continue Fosamax , calcium, and vitamin D  supplement.  Repeat in May 2026 along with mammogram as above.   Back pain/neck pain: Continue  follow-up with neurosurgery as scheduled.  Anxiety/depression: Continue monitoring and treatment per primary care.  Patient expressed understanding and was in agreement with this plan. She also understands that She can call clinic at any time with any questions, concerns, or complaints.    Cancer Staging  Primary cancer of upper outer quadrant of right female breast [facility]) Staging form: Breast, AJCC 8th Edition - Pathologic stage from 09/23/2024: ypT2, ypN0, cM0, G2, ER+, PR+, HER2- - Signed by Kelsey Evalene PARAS, MD on 09/23/2024 Stage prefix: Post-therapy Histologic grading system: 3 grade system   Evalene Russo Jacobo, MD   09/23/2024 2:38 PM     "

## 2024-09-22 NOTE — Progress Notes (Unsigned)
 Patient is passing out, she has fallen more than a hand full of times over the past 30 days. She can't eat due to her dentures not fitting in her mouth properly.
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
## 2024-09-30 ENCOUNTER — Ambulatory Visit: Payer: MEDICAID | Admitting: Family

## 2024-10-05 ENCOUNTER — Telehealth: Payer: Self-pay | Admitting: Psychiatry

## 2024-10-05 ENCOUNTER — Ambulatory Visit: Payer: MEDICAID | Admitting: Family

## 2024-10-05 ENCOUNTER — Other Ambulatory Visit: Payer: Self-pay | Admitting: Oncology

## 2024-10-05 ENCOUNTER — Encounter: Payer: Self-pay | Admitting: Family

## 2024-10-05 ENCOUNTER — Other Ambulatory Visit: Payer: Self-pay | Admitting: Family

## 2024-10-05 NOTE — Assessment & Plan Note (Signed)
 Patient stable.  Well controlled with current therapy.   Continue current meds.

## 2024-10-05 NOTE — Assessment & Plan Note (Signed)
 Continue current therapy for lipid control. Will modify as needed based on labwork results.

## 2024-10-05 NOTE — Assessment & Plan Note (Signed)
 Will continue supplements as needed.

## 2024-10-05 NOTE — Telephone Encounter (Signed)
 Patient called to cancel her follow up for 10/06/24 stating she is not going to [facility] and she is not taking any of her medications.

## 2024-10-05 NOTE — Assessment & Plan Note (Signed)
 Blood pressure well controlled with current medications.  Continue current therapy.  Will reassess at follow up.

## 2024-10-06 ENCOUNTER — Encounter: Payer: Self-pay | Admitting: Family

## 2024-10-06 ENCOUNTER — Ambulatory Visit: Payer: MEDICAID | Admitting: Psychiatry

## 2024-10-08 NOTE — Telephone Encounter (Signed)
 Patient seen since the VM was left

## 2024-10-09 ENCOUNTER — Other Ambulatory Visit: Payer: Self-pay

## 2024-10-09 ENCOUNTER — Ambulatory Visit: Payer: MEDICAID | Admitting: Family

## 2024-10-09 ENCOUNTER — Telehealth: Payer: Self-pay

## 2024-10-14 ENCOUNTER — Ambulatory Visit: Payer: MEDICAID | Admitting: Family

## 2025-01-18 ENCOUNTER — Other Ambulatory Visit: Payer: MEDICAID

## 2025-03-24 ENCOUNTER — Inpatient Hospital Stay: Payer: MEDICAID | Admitting: Oncology
